# Patient Record
Sex: Female | Born: 1940
Health system: Southern US, Community
[De-identification: ages and names within clinical notes are randomized; demographics above are authoritative.]

## PROBLEM LIST (undated history)

## (undated) DIAGNOSIS — I10 Essential (primary) hypertension: Secondary | ICD-10-CM

## (undated) DIAGNOSIS — N289 Disorder of kidney and ureter, unspecified: Secondary | ICD-10-CM

## (undated) DIAGNOSIS — E119 Type 2 diabetes mellitus without complications: Secondary | ICD-10-CM

## (undated) DIAGNOSIS — I639 Cerebral infarction, unspecified: Secondary | ICD-10-CM

## (undated) DIAGNOSIS — I509 Heart failure, unspecified: Secondary | ICD-10-CM

## (undated) DIAGNOSIS — E7800 Pure hypercholesterolemia, unspecified: Secondary | ICD-10-CM

## (undated) DIAGNOSIS — I4891 Unspecified atrial fibrillation: Secondary | ICD-10-CM

## (undated) DIAGNOSIS — I2510 Atherosclerotic heart disease of native coronary artery without angina pectoris: Secondary | ICD-10-CM

## (undated) HISTORY — DX: Atherosclerotic heart disease of native coronary artery without angina pectoris: I25.10

## (undated) HISTORY — DX: Cerebral infarction, unspecified: I63.9

## (undated) HISTORY — DX: Heart failure, unspecified: I50.9

## (undated) HISTORY — DX: Unspecified atrial fibrillation: I48.91

---

## 2018-12-11 DIAGNOSIS — M5416 Radiculopathy, lumbar region: Secondary | ICD-10-CM | POA: Diagnosis not present

## 2018-12-11 DIAGNOSIS — M5412 Radiculopathy, cervical region: Secondary | ICD-10-CM | POA: Diagnosis not present

## 2018-12-11 DIAGNOSIS — M542 Cervicalgia: Secondary | ICD-10-CM | POA: Diagnosis not present

## 2018-12-14 DIAGNOSIS — E1122 Type 2 diabetes mellitus with diabetic chronic kidney disease: Secondary | ICD-10-CM | POA: Diagnosis not present

## 2018-12-14 DIAGNOSIS — I739 Peripheral vascular disease, unspecified: Secondary | ICD-10-CM | POA: Diagnosis not present

## 2018-12-14 DIAGNOSIS — E785 Hyperlipidemia, unspecified: Secondary | ICD-10-CM | POA: Diagnosis not present

## 2018-12-14 DIAGNOSIS — I10 Essential (primary) hypertension: Secondary | ICD-10-CM | POA: Diagnosis not present

## 2018-12-14 DIAGNOSIS — I700 Atherosclerosis of aorta: Secondary | ICD-10-CM | POA: Diagnosis not present

## 2018-12-14 DIAGNOSIS — N183 Chronic kidney disease, stage 3 (moderate): Secondary | ICD-10-CM | POA: Diagnosis not present

## 2019-01-01 DIAGNOSIS — M5416 Radiculopathy, lumbar region: Secondary | ICD-10-CM | POA: Diagnosis not present

## 2019-01-01 DIAGNOSIS — M542 Cervicalgia: Secondary | ICD-10-CM | POA: Diagnosis not present

## 2019-01-01 DIAGNOSIS — M545 Low back pain: Secondary | ICD-10-CM | POA: Diagnosis not present

## 2019-01-01 DIAGNOSIS — M5412 Radiculopathy, cervical region: Secondary | ICD-10-CM | POA: Diagnosis not present

## 2019-01-01 DIAGNOSIS — Z79891 Long term (current) use of opiate analgesic: Secondary | ICD-10-CM | POA: Diagnosis not present

## 2019-01-01 DIAGNOSIS — M5126 Other intervertebral disc displacement, lumbar region: Secondary | ICD-10-CM | POA: Diagnosis not present

## 2019-01-19 DIAGNOSIS — M545 Low back pain: Secondary | ICD-10-CM | POA: Diagnosis not present

## 2019-01-19 DIAGNOSIS — E119 Type 2 diabetes mellitus without complications: Secondary | ICD-10-CM | POA: Diagnosis not present

## 2019-01-19 DIAGNOSIS — E860 Dehydration: Secondary | ICD-10-CM | POA: Diagnosis not present

## 2019-01-19 DIAGNOSIS — I10 Essential (primary) hypertension: Secondary | ICD-10-CM | POA: Diagnosis not present

## 2019-01-19 DIAGNOSIS — I119 Hypertensive heart disease without heart failure: Secondary | ICD-10-CM | POA: Diagnosis not present

## 2019-01-19 DIAGNOSIS — Z7984 Long term (current) use of oral hypoglycemic drugs: Secondary | ICD-10-CM | POA: Diagnosis not present

## 2019-01-19 DIAGNOSIS — R197 Diarrhea, unspecified: Secondary | ICD-10-CM | POA: Diagnosis not present

## 2019-01-19 DIAGNOSIS — R1084 Generalized abdominal pain: Secondary | ICD-10-CM | POA: Diagnosis not present

## 2019-01-19 DIAGNOSIS — I214 Non-ST elevation (NSTEMI) myocardial infarction: Secondary | ICD-10-CM | POA: Diagnosis not present

## 2019-01-19 DIAGNOSIS — M5416 Radiculopathy, lumbar region: Secondary | ICD-10-CM | POA: Diagnosis not present

## 2019-01-19 DIAGNOSIS — M5116 Intervertebral disc disorders with radiculopathy, lumbar region: Secondary | ICD-10-CM | POA: Diagnosis not present

## 2019-01-19 DIAGNOSIS — R1110 Vomiting, unspecified: Secondary | ICD-10-CM | POA: Diagnosis not present

## 2019-01-19 DIAGNOSIS — R7989 Other specified abnormal findings of blood chemistry: Secondary | ICD-10-CM | POA: Diagnosis not present

## 2019-01-19 DIAGNOSIS — E876 Hypokalemia: Secondary | ICD-10-CM | POA: Diagnosis not present

## 2019-01-19 DIAGNOSIS — Z79899 Other long term (current) drug therapy: Secondary | ICD-10-CM | POA: Diagnosis not present

## 2019-01-20 DIAGNOSIS — E876 Hypokalemia: Secondary | ICD-10-CM | POA: Diagnosis not present

## 2019-01-20 DIAGNOSIS — R109 Unspecified abdominal pain: Secondary | ICD-10-CM | POA: Diagnosis not present

## 2019-01-20 DIAGNOSIS — I214 Non-ST elevation (NSTEMI) myocardial infarction: Secondary | ICD-10-CM | POA: Diagnosis not present

## 2019-01-20 DIAGNOSIS — E119 Type 2 diabetes mellitus without complications: Secondary | ICD-10-CM | POA: Diagnosis not present

## 2019-01-20 DIAGNOSIS — I119 Hypertensive heart disease without heart failure: Secondary | ICD-10-CM | POA: Diagnosis not present

## 2019-01-20 DIAGNOSIS — R1110 Vomiting, unspecified: Secondary | ICD-10-CM | POA: Diagnosis not present

## 2019-01-20 DIAGNOSIS — K529 Noninfective gastroenteritis and colitis, unspecified: Secondary | ICD-10-CM | POA: Diagnosis not present

## 2019-01-20 DIAGNOSIS — E860 Dehydration: Secondary | ICD-10-CM | POA: Diagnosis not present

## 2019-01-20 DIAGNOSIS — R1084 Generalized abdominal pain: Secondary | ICD-10-CM | POA: Diagnosis not present

## 2019-01-20 DIAGNOSIS — E785 Hyperlipidemia, unspecified: Secondary | ICD-10-CM | POA: Diagnosis not present

## 2019-01-20 DIAGNOSIS — M5440 Lumbago with sciatica, unspecified side: Secondary | ICD-10-CM | POA: Diagnosis not present

## 2019-01-20 DIAGNOSIS — R7989 Other specified abnormal findings of blood chemistry: Secondary | ICD-10-CM | POA: Diagnosis not present

## 2019-01-20 DIAGNOSIS — I340 Nonrheumatic mitral (valve) insufficiency: Secondary | ICD-10-CM | POA: Diagnosis not present

## 2019-01-20 DIAGNOSIS — R197 Diarrhea, unspecified: Secondary | ICD-10-CM | POA: Diagnosis not present

## 2019-01-20 DIAGNOSIS — I160 Hypertensive urgency: Secondary | ICD-10-CM | POA: Diagnosis not present

## 2019-01-21 DIAGNOSIS — R7989 Other specified abnormal findings of blood chemistry: Secondary | ICD-10-CM | POA: Diagnosis not present

## 2019-01-23 DIAGNOSIS — K529 Noninfective gastroenteritis and colitis, unspecified: Secondary | ICD-10-CM | POA: Diagnosis not present

## 2019-01-23 DIAGNOSIS — I440 Atrioventricular block, first degree: Secondary | ICD-10-CM | POA: Diagnosis not present

## 2019-01-23 DIAGNOSIS — E860 Dehydration: Secondary | ICD-10-CM | POA: Diagnosis not present

## 2019-01-23 DIAGNOSIS — M5430 Sciatica, unspecified side: Secondary | ICD-10-CM | POA: Diagnosis not present

## 2019-01-23 DIAGNOSIS — N289 Disorder of kidney and ureter, unspecified: Secondary | ICD-10-CM | POA: Diagnosis not present

## 2019-01-23 DIAGNOSIS — I10 Essential (primary) hypertension: Secondary | ICD-10-CM | POA: Diagnosis not present

## 2019-01-23 DIAGNOSIS — E119 Type 2 diabetes mellitus without complications: Secondary | ICD-10-CM | POA: Diagnosis not present

## 2019-01-23 DIAGNOSIS — I160 Hypertensive urgency: Secondary | ICD-10-CM | POA: Diagnosis not present

## 2019-01-23 DIAGNOSIS — E669 Obesity, unspecified: Secondary | ICD-10-CM | POA: Diagnosis not present

## 2019-01-23 DIAGNOSIS — Z7982 Long term (current) use of aspirin: Secondary | ICD-10-CM | POA: Diagnosis not present

## 2019-01-28 DIAGNOSIS — E785 Hyperlipidemia, unspecified: Secondary | ICD-10-CM | POA: Diagnosis not present

## 2019-01-28 DIAGNOSIS — E1142 Type 2 diabetes mellitus with diabetic polyneuropathy: Secondary | ICD-10-CM | POA: Diagnosis not present

## 2019-01-28 DIAGNOSIS — I700 Atherosclerosis of aorta: Secondary | ICD-10-CM | POA: Diagnosis not present

## 2019-01-28 DIAGNOSIS — E1122 Type 2 diabetes mellitus with diabetic chronic kidney disease: Secondary | ICD-10-CM | POA: Diagnosis not present

## 2019-01-28 DIAGNOSIS — M1990 Unspecified osteoarthritis, unspecified site: Secondary | ICD-10-CM | POA: Diagnosis not present

## 2019-01-28 DIAGNOSIS — Z1389 Encounter for screening for other disorder: Secondary | ICD-10-CM | POA: Diagnosis not present

## 2019-01-28 DIAGNOSIS — G4700 Insomnia, unspecified: Secondary | ICD-10-CM | POA: Diagnosis not present

## 2019-01-28 DIAGNOSIS — Z0289 Encounter for other administrative examinations: Secondary | ICD-10-CM | POA: Diagnosis not present

## 2019-01-28 DIAGNOSIS — E559 Vitamin D deficiency, unspecified: Secondary | ICD-10-CM | POA: Diagnosis not present

## 2019-01-28 DIAGNOSIS — K5900 Constipation, unspecified: Secondary | ICD-10-CM | POA: Diagnosis not present

## 2019-01-28 DIAGNOSIS — N183 Chronic kidney disease, stage 3 (moderate): Secondary | ICD-10-CM | POA: Diagnosis not present

## 2019-01-28 DIAGNOSIS — I739 Peripheral vascular disease, unspecified: Secondary | ICD-10-CM | POA: Diagnosis not present

## 2019-01-28 DIAGNOSIS — I10 Essential (primary) hypertension: Secondary | ICD-10-CM | POA: Diagnosis not present

## 2019-02-01 DIAGNOSIS — K219 Gastro-esophageal reflux disease without esophagitis: Secondary | ICD-10-CM | POA: Diagnosis not present

## 2019-02-01 DIAGNOSIS — Z6835 Body mass index (BMI) 35.0-35.9, adult: Secondary | ICD-10-CM | POA: Diagnosis not present

## 2019-02-01 DIAGNOSIS — E785 Hyperlipidemia, unspecified: Secondary | ICD-10-CM | POA: Diagnosis not present

## 2019-02-01 DIAGNOSIS — I739 Peripheral vascular disease, unspecified: Secondary | ICD-10-CM | POA: Diagnosis not present

## 2019-02-15 DIAGNOSIS — G4700 Insomnia, unspecified: Secondary | ICD-10-CM | POA: Diagnosis not present

## 2019-02-15 DIAGNOSIS — I700 Atherosclerosis of aorta: Secondary | ICD-10-CM | POA: Diagnosis not present

## 2019-02-15 DIAGNOSIS — Z6835 Body mass index (BMI) 35.0-35.9, adult: Secondary | ICD-10-CM | POA: Diagnosis not present

## 2019-02-15 DIAGNOSIS — M4802 Spinal stenosis, cervical region: Secondary | ICD-10-CM | POA: Diagnosis not present

## 2019-02-15 DIAGNOSIS — G720 Drug-induced myopathy: Secondary | ICD-10-CM | POA: Diagnosis not present

## 2019-02-15 DIAGNOSIS — E559 Vitamin D deficiency, unspecified: Secondary | ICD-10-CM | POA: Diagnosis not present

## 2019-02-15 DIAGNOSIS — I739 Peripheral vascular disease, unspecified: Secondary | ICD-10-CM | POA: Diagnosis not present

## 2019-06-16 DIAGNOSIS — H04123 Dry eye syndrome of bilateral lacrimal glands: Secondary | ICD-10-CM | POA: Diagnosis not present

## 2019-06-16 DIAGNOSIS — H524 Presbyopia: Secondary | ICD-10-CM | POA: Diagnosis not present

## 2019-06-16 DIAGNOSIS — E119 Type 2 diabetes mellitus without complications: Secondary | ICD-10-CM | POA: Diagnosis not present

## 2019-07-02 DIAGNOSIS — Z6835 Body mass index (BMI) 35.0-35.9, adult: Secondary | ICD-10-CM | POA: Diagnosis not present

## 2019-07-02 DIAGNOSIS — M2391 Unspecified internal derangement of right knee: Secondary | ICD-10-CM | POA: Diagnosis not present

## 2019-07-02 DIAGNOSIS — M2392 Unspecified internal derangement of left knee: Secondary | ICD-10-CM | POA: Diagnosis not present

## 2019-07-19 DIAGNOSIS — I739 Peripheral vascular disease, unspecified: Secondary | ICD-10-CM | POA: Diagnosis not present

## 2019-07-19 DIAGNOSIS — D692 Other nonthrombocytopenic purpura: Secondary | ICD-10-CM | POA: Diagnosis not present

## 2019-07-19 DIAGNOSIS — K5900 Constipation, unspecified: Secondary | ICD-10-CM | POA: Diagnosis not present

## 2019-07-19 DIAGNOSIS — Z23 Encounter for immunization: Secondary | ICD-10-CM | POA: Diagnosis not present

## 2019-07-19 DIAGNOSIS — E1122 Type 2 diabetes mellitus with diabetic chronic kidney disease: Secondary | ICD-10-CM | POA: Diagnosis not present

## 2019-07-19 DIAGNOSIS — K219 Gastro-esophageal reflux disease without esophagitis: Secondary | ICD-10-CM | POA: Diagnosis not present

## 2019-07-19 DIAGNOSIS — I10 Essential (primary) hypertension: Secondary | ICD-10-CM | POA: Diagnosis not present

## 2019-07-19 DIAGNOSIS — I700 Atherosclerosis of aorta: Secondary | ICD-10-CM | POA: Diagnosis not present

## 2019-07-19 DIAGNOSIS — E1142 Type 2 diabetes mellitus with diabetic polyneuropathy: Secondary | ICD-10-CM | POA: Diagnosis not present

## 2019-07-19 DIAGNOSIS — M1990 Unspecified osteoarthritis, unspecified site: Secondary | ICD-10-CM | POA: Diagnosis not present

## 2019-07-19 DIAGNOSIS — N183 Chronic kidney disease, stage 3 (moderate): Secondary | ICD-10-CM | POA: Diagnosis not present

## 2019-08-17 DIAGNOSIS — H02413 Mechanical ptosis of bilateral eyelids: Secondary | ICD-10-CM | POA: Diagnosis not present

## 2019-09-02 DIAGNOSIS — N183 Chronic kidney disease, stage 3 (moderate): Secondary | ICD-10-CM | POA: Diagnosis not present

## 2019-09-02 DIAGNOSIS — E1142 Type 2 diabetes mellitus with diabetic polyneuropathy: Secondary | ICD-10-CM | POA: Diagnosis not present

## 2019-09-02 DIAGNOSIS — E1122 Type 2 diabetes mellitus with diabetic chronic kidney disease: Secondary | ICD-10-CM | POA: Diagnosis not present

## 2019-09-02 DIAGNOSIS — I739 Peripheral vascular disease, unspecified: Secondary | ICD-10-CM | POA: Diagnosis not present

## 2019-09-02 DIAGNOSIS — D692 Other nonthrombocytopenic purpura: Secondary | ICD-10-CM | POA: Diagnosis not present

## 2019-09-02 DIAGNOSIS — I700 Atherosclerosis of aorta: Secondary | ICD-10-CM | POA: Diagnosis not present

## 2019-09-02 DIAGNOSIS — Z0289 Encounter for other administrative examinations: Secondary | ICD-10-CM | POA: Diagnosis not present

## 2019-09-02 DIAGNOSIS — Z6835 Body mass index (BMI) 35.0-35.9, adult: Secondary | ICD-10-CM | POA: Diagnosis not present

## 2019-09-08 DIAGNOSIS — I700 Atherosclerosis of aorta: Secondary | ICD-10-CM | POA: Diagnosis not present

## 2019-09-08 DIAGNOSIS — R69 Illness, unspecified: Secondary | ICD-10-CM | POA: Diagnosis not present

## 2019-09-08 DIAGNOSIS — M109 Gout, unspecified: Secondary | ICD-10-CM | POA: Diagnosis not present

## 2019-09-08 DIAGNOSIS — M545 Low back pain: Secondary | ICD-10-CM | POA: Diagnosis not present

## 2019-09-08 DIAGNOSIS — R351 Nocturia: Secondary | ICD-10-CM | POA: Diagnosis not present

## 2019-09-09 DIAGNOSIS — R350 Frequency of micturition: Secondary | ICD-10-CM | POA: Diagnosis not present

## 2019-09-09 DIAGNOSIS — R69 Illness, unspecified: Secondary | ICD-10-CM | POA: Diagnosis not present

## 2019-09-09 DIAGNOSIS — R351 Nocturia: Secondary | ICD-10-CM | POA: Diagnosis not present

## 2019-09-09 DIAGNOSIS — I700 Atherosclerosis of aorta: Secondary | ICD-10-CM | POA: Diagnosis not present

## 2019-09-09 DIAGNOSIS — M545 Low back pain: Secondary | ICD-10-CM | POA: Diagnosis not present

## 2019-09-09 DIAGNOSIS — N3941 Urge incontinence: Secondary | ICD-10-CM | POA: Diagnosis not present

## 2019-09-09 DIAGNOSIS — R3915 Urgency of urination: Secondary | ICD-10-CM | POA: Diagnosis not present

## 2019-09-09 DIAGNOSIS — M109 Gout, unspecified: Secondary | ICD-10-CM | POA: Diagnosis not present

## 2019-09-10 DIAGNOSIS — R351 Nocturia: Secondary | ICD-10-CM | POA: Diagnosis not present

## 2019-09-10 DIAGNOSIS — M545 Low back pain: Secondary | ICD-10-CM | POA: Diagnosis not present

## 2019-09-10 DIAGNOSIS — M109 Gout, unspecified: Secondary | ICD-10-CM | POA: Diagnosis not present

## 2019-09-10 DIAGNOSIS — R69 Illness, unspecified: Secondary | ICD-10-CM | POA: Diagnosis not present

## 2019-09-10 DIAGNOSIS — I700 Atherosclerosis of aorta: Secondary | ICD-10-CM | POA: Diagnosis not present

## 2019-09-13 DIAGNOSIS — R351 Nocturia: Secondary | ICD-10-CM | POA: Diagnosis not present

## 2019-09-13 DIAGNOSIS — M545 Low back pain: Secondary | ICD-10-CM | POA: Diagnosis not present

## 2019-09-13 DIAGNOSIS — M109 Gout, unspecified: Secondary | ICD-10-CM | POA: Diagnosis not present

## 2019-09-13 DIAGNOSIS — I700 Atherosclerosis of aorta: Secondary | ICD-10-CM | POA: Diagnosis not present

## 2019-09-13 DIAGNOSIS — R69 Illness, unspecified: Secondary | ICD-10-CM | POA: Diagnosis not present

## 2019-09-14 DIAGNOSIS — I700 Atherosclerosis of aorta: Secondary | ICD-10-CM | POA: Diagnosis not present

## 2019-09-14 DIAGNOSIS — M109 Gout, unspecified: Secondary | ICD-10-CM | POA: Diagnosis not present

## 2019-09-14 DIAGNOSIS — R69 Illness, unspecified: Secondary | ICD-10-CM | POA: Diagnosis not present

## 2019-09-14 DIAGNOSIS — M545 Low back pain: Secondary | ICD-10-CM | POA: Diagnosis not present

## 2019-09-14 DIAGNOSIS — R351 Nocturia: Secondary | ICD-10-CM | POA: Diagnosis not present

## 2019-09-15 DIAGNOSIS — I739 Peripheral vascular disease, unspecified: Secondary | ICD-10-CM | POA: Diagnosis not present

## 2019-09-15 DIAGNOSIS — I10 Essential (primary) hypertension: Secondary | ICD-10-CM | POA: Diagnosis not present

## 2019-09-15 DIAGNOSIS — D692 Other nonthrombocytopenic purpura: Secondary | ICD-10-CM | POA: Diagnosis not present

## 2019-09-15 DIAGNOSIS — E1142 Type 2 diabetes mellitus with diabetic polyneuropathy: Secondary | ICD-10-CM | POA: Diagnosis not present

## 2019-09-15 DIAGNOSIS — M545 Low back pain: Secondary | ICD-10-CM | POA: Diagnosis not present

## 2019-09-15 DIAGNOSIS — M109 Gout, unspecified: Secondary | ICD-10-CM | POA: Diagnosis not present

## 2019-09-15 DIAGNOSIS — R69 Illness, unspecified: Secondary | ICD-10-CM | POA: Diagnosis not present

## 2019-09-15 DIAGNOSIS — N183 Chronic kidney disease, stage 3 unspecified: Secondary | ICD-10-CM | POA: Diagnosis not present

## 2019-09-15 DIAGNOSIS — R351 Nocturia: Secondary | ICD-10-CM | POA: Diagnosis not present

## 2019-09-15 DIAGNOSIS — I700 Atherosclerosis of aorta: Secondary | ICD-10-CM | POA: Diagnosis not present

## 2019-09-15 DIAGNOSIS — Z6835 Body mass index (BMI) 35.0-35.9, adult: Secondary | ICD-10-CM | POA: Diagnosis not present

## 2019-09-15 DIAGNOSIS — E1122 Type 2 diabetes mellitus with diabetic chronic kidney disease: Secondary | ICD-10-CM | POA: Diagnosis not present

## 2019-09-16 DIAGNOSIS — I700 Atherosclerosis of aorta: Secondary | ICD-10-CM | POA: Diagnosis not present

## 2019-09-16 DIAGNOSIS — M545 Low back pain: Secondary | ICD-10-CM | POA: Diagnosis not present

## 2019-09-16 DIAGNOSIS — R69 Illness, unspecified: Secondary | ICD-10-CM | POA: Diagnosis not present

## 2019-09-16 DIAGNOSIS — M109 Gout, unspecified: Secondary | ICD-10-CM | POA: Diagnosis not present

## 2019-09-16 DIAGNOSIS — R351 Nocturia: Secondary | ICD-10-CM | POA: Diagnosis not present

## 2019-09-22 DIAGNOSIS — R221 Localized swelling, mass and lump, neck: Secondary | ICD-10-CM | POA: Diagnosis not present

## 2019-09-22 DIAGNOSIS — H6121 Impacted cerumen, right ear: Secondary | ICD-10-CM | POA: Diagnosis not present

## 2019-09-22 DIAGNOSIS — K219 Gastro-esophageal reflux disease without esophagitis: Secondary | ICD-10-CM | POA: Diagnosis not present

## 2019-09-23 DIAGNOSIS — Z6835 Body mass index (BMI) 35.0-35.9, adult: Secondary | ICD-10-CM | POA: Diagnosis not present

## 2019-09-23 DIAGNOSIS — I10 Essential (primary) hypertension: Secondary | ICD-10-CM | POA: Diagnosis not present

## 2019-09-29 DIAGNOSIS — K219 Gastro-esophageal reflux disease without esophagitis: Secondary | ICD-10-CM | POA: Diagnosis not present

## 2019-09-29 DIAGNOSIS — R221 Localized swelling, mass and lump, neck: Secondary | ICD-10-CM | POA: Diagnosis not present

## 2019-10-20 DIAGNOSIS — E7800 Pure hypercholesterolemia, unspecified: Secondary | ICD-10-CM | POA: Diagnosis not present

## 2019-10-20 DIAGNOSIS — Z1331 Encounter for screening for depression: Secondary | ICD-10-CM | POA: Diagnosis not present

## 2019-10-20 DIAGNOSIS — E1165 Type 2 diabetes mellitus with hyperglycemia: Secondary | ICD-10-CM | POA: Diagnosis not present

## 2019-10-20 DIAGNOSIS — M159 Polyosteoarthritis, unspecified: Secondary | ICD-10-CM | POA: Diagnosis not present

## 2019-10-20 DIAGNOSIS — I10 Essential (primary) hypertension: Secondary | ICD-10-CM | POA: Diagnosis not present

## 2019-10-20 DIAGNOSIS — M542 Cervicalgia: Secondary | ICD-10-CM | POA: Diagnosis not present

## 2019-10-20 DIAGNOSIS — Z6832 Body mass index (BMI) 32.0-32.9, adult: Secondary | ICD-10-CM | POA: Diagnosis not present

## 2019-10-20 DIAGNOSIS — Z0001 Encounter for general adult medical examination with abnormal findings: Secondary | ICD-10-CM | POA: Diagnosis not present

## 2019-10-25 DIAGNOSIS — Z0001 Encounter for general adult medical examination with abnormal findings: Secondary | ICD-10-CM | POA: Diagnosis not present

## 2019-10-25 DIAGNOSIS — Z6832 Body mass index (BMI) 32.0-32.9, adult: Secondary | ICD-10-CM | POA: Diagnosis not present

## 2019-10-25 DIAGNOSIS — R5383 Other fatigue: Secondary | ICD-10-CM | POA: Diagnosis not present

## 2019-10-25 DIAGNOSIS — M542 Cervicalgia: Secondary | ICD-10-CM | POA: Diagnosis not present

## 2019-10-25 DIAGNOSIS — E7800 Pure hypercholesterolemia, unspecified: Secondary | ICD-10-CM | POA: Diagnosis not present

## 2019-10-27 DIAGNOSIS — Z1211 Encounter for screening for malignant neoplasm of colon: Secondary | ICD-10-CM | POA: Diagnosis not present

## 2019-11-14 DIAGNOSIS — E1122 Type 2 diabetes mellitus with diabetic chronic kidney disease: Secondary | ICD-10-CM | POA: Diagnosis not present

## 2019-11-14 DIAGNOSIS — M159 Polyosteoarthritis, unspecified: Secondary | ICD-10-CM | POA: Diagnosis not present

## 2019-11-14 DIAGNOSIS — E1165 Type 2 diabetes mellitus with hyperglycemia: Secondary | ICD-10-CM | POA: Diagnosis not present

## 2019-11-14 DIAGNOSIS — N1831 Chronic kidney disease, stage 3a: Secondary | ICD-10-CM | POA: Diagnosis not present

## 2019-11-14 DIAGNOSIS — M542 Cervicalgia: Secondary | ICD-10-CM | POA: Diagnosis not present

## 2019-11-14 DIAGNOSIS — E559 Vitamin D deficiency, unspecified: Secondary | ICD-10-CM | POA: Diagnosis not present

## 2019-11-14 DIAGNOSIS — Z6832 Body mass index (BMI) 32.0-32.9, adult: Secondary | ICD-10-CM | POA: Diagnosis not present

## 2019-11-14 DIAGNOSIS — I10 Essential (primary) hypertension: Secondary | ICD-10-CM | POA: Diagnosis not present

## 2019-11-14 DIAGNOSIS — E782 Mixed hyperlipidemia: Secondary | ICD-10-CM | POA: Diagnosis not present

## 2019-11-16 DIAGNOSIS — M8588 Other specified disorders of bone density and structure, other site: Secondary | ICD-10-CM | POA: Diagnosis not present

## 2019-11-16 DIAGNOSIS — Z1231 Encounter for screening mammogram for malignant neoplasm of breast: Secondary | ICD-10-CM | POA: Diagnosis not present

## 2019-11-22 DIAGNOSIS — H0011 Chalazion right upper eyelid: Secondary | ICD-10-CM | POA: Diagnosis not present

## 2019-11-29 DIAGNOSIS — H04123 Dry eye syndrome of bilateral lacrimal glands: Secondary | ICD-10-CM | POA: Diagnosis not present

## 2019-11-29 DIAGNOSIS — H0100B Unspecified blepharitis left eye, upper and lower eyelids: Secondary | ICD-10-CM | POA: Diagnosis not present

## 2019-11-29 DIAGNOSIS — H0011 Chalazion right upper eyelid: Secondary | ICD-10-CM | POA: Diagnosis not present

## 2019-11-29 DIAGNOSIS — H0100A Unspecified blepharitis right eye, upper and lower eyelids: Secondary | ICD-10-CM | POA: Diagnosis not present

## 2019-12-13 DIAGNOSIS — Z008 Encounter for other general examination: Secondary | ICD-10-CM | POA: Diagnosis not present

## 2019-12-14 DIAGNOSIS — N1831 Chronic kidney disease, stage 3a: Secondary | ICD-10-CM | POA: Diagnosis not present

## 2019-12-14 DIAGNOSIS — M1990 Unspecified osteoarthritis, unspecified site: Secondary | ICD-10-CM | POA: Diagnosis not present

## 2019-12-14 DIAGNOSIS — E559 Vitamin D deficiency, unspecified: Secondary | ICD-10-CM | POA: Diagnosis not present

## 2019-12-14 DIAGNOSIS — E1122 Type 2 diabetes mellitus with diabetic chronic kidney disease: Secondary | ICD-10-CM | POA: Diagnosis not present

## 2019-12-14 DIAGNOSIS — I10 Essential (primary) hypertension: Secondary | ICD-10-CM | POA: Diagnosis not present

## 2019-12-14 DIAGNOSIS — M159 Polyosteoarthritis, unspecified: Secondary | ICD-10-CM | POA: Diagnosis not present

## 2019-12-14 DIAGNOSIS — M542 Cervicalgia: Secondary | ICD-10-CM | POA: Diagnosis not present

## 2019-12-14 DIAGNOSIS — E1165 Type 2 diabetes mellitus with hyperglycemia: Secondary | ICD-10-CM | POA: Diagnosis not present

## 2019-12-14 DIAGNOSIS — Z008 Encounter for other general examination: Secondary | ICD-10-CM | POA: Diagnosis not present

## 2019-12-14 DIAGNOSIS — L02415 Cutaneous abscess of right lower limb: Secondary | ICD-10-CM | POA: Diagnosis not present

## 2019-12-15 DIAGNOSIS — Z743 Need for continuous supervision: Secondary | ICD-10-CM | POA: Diagnosis not present

## 2019-12-15 DIAGNOSIS — E119 Type 2 diabetes mellitus without complications: Secondary | ICD-10-CM | POA: Diagnosis not present

## 2019-12-15 DIAGNOSIS — M542 Cervicalgia: Secondary | ICD-10-CM | POA: Diagnosis not present

## 2019-12-15 DIAGNOSIS — M436 Torticollis: Secondary | ICD-10-CM | POA: Diagnosis not present

## 2019-12-15 DIAGNOSIS — R000 Tachycardia, unspecified: Secondary | ICD-10-CM | POA: Diagnosis not present

## 2019-12-15 DIAGNOSIS — I10 Essential (primary) hypertension: Secondary | ICD-10-CM | POA: Diagnosis not present

## 2019-12-16 DIAGNOSIS — E042 Nontoxic multinodular goiter: Secondary | ICD-10-CM | POA: Diagnosis not present

## 2019-12-16 DIAGNOSIS — R634 Abnormal weight loss: Secondary | ICD-10-CM | POA: Diagnosis not present

## 2019-12-16 DIAGNOSIS — Z008 Encounter for other general examination: Secondary | ICD-10-CM | POA: Diagnosis not present

## 2019-12-17 DIAGNOSIS — L02214 Cutaneous abscess of groin: Secondary | ICD-10-CM | POA: Diagnosis not present

## 2019-12-19 DIAGNOSIS — I10 Essential (primary) hypertension: Secondary | ICD-10-CM | POA: Diagnosis not present

## 2019-12-19 DIAGNOSIS — L02214 Cutaneous abscess of groin: Secondary | ICD-10-CM | POA: Diagnosis not present

## 2020-01-06 DIAGNOSIS — M8949 Other hypertrophic osteoarthropathy, multiple sites: Secondary | ICD-10-CM | POA: Diagnosis not present

## 2020-01-06 DIAGNOSIS — E041 Nontoxic single thyroid nodule: Secondary | ICD-10-CM | POA: Diagnosis not present

## 2020-01-06 DIAGNOSIS — R69 Illness, unspecified: Secondary | ICD-10-CM | POA: Diagnosis not present

## 2020-01-06 DIAGNOSIS — I10 Essential (primary) hypertension: Secondary | ICD-10-CM | POA: Diagnosis not present

## 2020-01-06 DIAGNOSIS — I517 Cardiomegaly: Secondary | ICD-10-CM | POA: Diagnosis not present

## 2020-01-06 DIAGNOSIS — E782 Mixed hyperlipidemia: Secondary | ICD-10-CM | POA: Diagnosis not present

## 2020-01-06 DIAGNOSIS — E1165 Type 2 diabetes mellitus with hyperglycemia: Secondary | ICD-10-CM | POA: Diagnosis not present

## 2020-01-06 DIAGNOSIS — K219 Gastro-esophageal reflux disease without esophagitis: Secondary | ICD-10-CM | POA: Diagnosis not present

## 2020-01-06 DIAGNOSIS — E559 Vitamin D deficiency, unspecified: Secondary | ICD-10-CM | POA: Diagnosis not present

## 2020-01-14 DIAGNOSIS — E782 Mixed hyperlipidemia: Secondary | ICD-10-CM | POA: Diagnosis not present

## 2020-01-14 DIAGNOSIS — E1121 Type 2 diabetes mellitus with diabetic nephropathy: Secondary | ICD-10-CM | POA: Diagnosis not present

## 2020-01-14 DIAGNOSIS — M542 Cervicalgia: Secondary | ICD-10-CM | POA: Diagnosis not present

## 2020-01-14 DIAGNOSIS — E559 Vitamin D deficiency, unspecified: Secondary | ICD-10-CM | POA: Diagnosis not present

## 2020-01-14 DIAGNOSIS — R05 Cough: Secondary | ICD-10-CM | POA: Diagnosis not present

## 2020-01-14 DIAGNOSIS — E1165 Type 2 diabetes mellitus with hyperglycemia: Secondary | ICD-10-CM | POA: Diagnosis not present

## 2020-01-14 DIAGNOSIS — K5904 Chronic idiopathic constipation: Secondary | ICD-10-CM | POA: Diagnosis not present

## 2020-01-14 DIAGNOSIS — N189 Chronic kidney disease, unspecified: Secondary | ICD-10-CM | POA: Diagnosis not present

## 2020-01-14 DIAGNOSIS — I1310 Hypertensive heart and chronic kidney disease without heart failure, with stage 1 through stage 4 chronic kidney disease, or unspecified chronic kidney disease: Secondary | ICD-10-CM | POA: Diagnosis not present

## 2020-01-14 DIAGNOSIS — K219 Gastro-esophageal reflux disease without esophagitis: Secondary | ICD-10-CM | POA: Diagnosis not present

## 2020-01-24 DIAGNOSIS — M542 Cervicalgia: Secondary | ICD-10-CM | POA: Diagnosis not present

## 2020-01-24 DIAGNOSIS — E559 Vitamin D deficiency, unspecified: Secondary | ICD-10-CM | POA: Diagnosis not present

## 2020-01-24 DIAGNOSIS — R05 Cough: Secondary | ICD-10-CM | POA: Diagnosis not present

## 2020-01-24 DIAGNOSIS — I10 Essential (primary) hypertension: Secondary | ICD-10-CM | POA: Diagnosis not present

## 2020-01-24 DIAGNOSIS — N189 Chronic kidney disease, unspecified: Secondary | ICD-10-CM | POA: Diagnosis not present

## 2020-01-26 DIAGNOSIS — I10 Essential (primary) hypertension: Secondary | ICD-10-CM | POA: Diagnosis not present

## 2020-01-26 DIAGNOSIS — R69 Illness, unspecified: Secondary | ICD-10-CM | POA: Diagnosis not present

## 2020-01-26 DIAGNOSIS — E782 Mixed hyperlipidemia: Secondary | ICD-10-CM | POA: Diagnosis not present

## 2020-01-26 DIAGNOSIS — G8929 Other chronic pain: Secondary | ICD-10-CM | POA: Diagnosis not present

## 2020-01-26 DIAGNOSIS — M542 Cervicalgia: Secondary | ICD-10-CM | POA: Diagnosis not present

## 2020-01-26 DIAGNOSIS — R7989 Other specified abnormal findings of blood chemistry: Secondary | ICD-10-CM | POA: Diagnosis not present

## 2020-01-26 DIAGNOSIS — R05 Cough: Secondary | ICD-10-CM | POA: Diagnosis not present

## 2020-01-28 DIAGNOSIS — G8929 Other chronic pain: Secondary | ICD-10-CM | POA: Diagnosis not present

## 2020-02-10 DIAGNOSIS — R7989 Other specified abnormal findings of blood chemistry: Secondary | ICD-10-CM | POA: Diagnosis not present

## 2020-02-10 DIAGNOSIS — Z1159 Encounter for screening for other viral diseases: Secondary | ICD-10-CM | POA: Diagnosis not present

## 2020-02-10 DIAGNOSIS — I10 Essential (primary) hypertension: Secondary | ICD-10-CM | POA: Diagnosis not present

## 2020-02-10 DIAGNOSIS — Z0000 Encounter for general adult medical examination without abnormal findings: Secondary | ICD-10-CM | POA: Diagnosis not present

## 2020-02-10 DIAGNOSIS — E559 Vitamin D deficiency, unspecified: Secondary | ICD-10-CM | POA: Diagnosis not present

## 2020-02-10 DIAGNOSIS — G8929 Other chronic pain: Secondary | ICD-10-CM | POA: Diagnosis not present

## 2020-02-10 DIAGNOSIS — R05 Cough: Secondary | ICD-10-CM | POA: Diagnosis not present

## 2020-02-10 DIAGNOSIS — M542 Cervicalgia: Secondary | ICD-10-CM | POA: Diagnosis not present

## 2020-02-10 DIAGNOSIS — Z23 Encounter for immunization: Secondary | ICD-10-CM | POA: Diagnosis not present

## 2020-02-10 DIAGNOSIS — Z79899 Other long term (current) drug therapy: Secondary | ICD-10-CM | POA: Diagnosis not present

## 2020-02-28 ENCOUNTER — Other Ambulatory Visit: Payer: Self-pay

## 2020-02-28 ENCOUNTER — Telehealth: Payer: Self-pay | Admitting: Emergency Medicine

## 2020-02-28 ENCOUNTER — Encounter: Payer: Self-pay | Admitting: Emergency Medicine

## 2020-02-28 ENCOUNTER — Ambulatory Visit
Admission: EM | Admit: 2020-02-28 | Discharge: 2020-02-28 | Disposition: A | Discharge status: Home / Self Care | Payer: Medicare HMO

## 2020-02-28 ENCOUNTER — Ambulatory Visit (HOSPITAL_COMMUNITY): Payer: Medicare HMO

## 2020-02-28 DIAGNOSIS — N184 Chronic kidney disease, stage 4 (severe): Secondary | ICD-10-CM | POA: Diagnosis not present

## 2020-02-28 DIAGNOSIS — R5383 Other fatigue: Secondary | ICD-10-CM | POA: Diagnosis not present

## 2020-02-28 DIAGNOSIS — R197 Diarrhea, unspecified: Secondary | ICD-10-CM | POA: Diagnosis not present

## 2020-02-28 DIAGNOSIS — R600 Localized edema: Secondary | ICD-10-CM | POA: Diagnosis not present

## 2020-02-28 HISTORY — DX: Pure hypercholesterolemia, unspecified: E78.00

## 2020-02-28 HISTORY — DX: Disorder of kidney and ureter, unspecified: N28.9

## 2020-02-28 HISTORY — DX: Essential (primary) hypertension: I10

## 2020-02-28 HISTORY — DX: Type 2 diabetes mellitus without complications: E11.9

## 2020-02-28 LAB — COMPREHENSIVE METABOLIC PANEL
ALT: 12 IU/L (ref 0–32)
AST: 17 IU/L (ref 0–40)
Albumin/Globulin Ratio: 1.3 (ref 1.2–2.2)
Albumin: 3.9 g/dL (ref 3.7–4.7)
Alkaline Phosphatase: 73 IU/L (ref 39–117)
BUN/Creatinine Ratio: 12 (ref 12–28)
BUN: 17 mg/dL (ref 8–27)
Bilirubin Total: 0.5 mg/dL (ref 0.0–1.2)
CO2: 22 mmol/L (ref 20–29)
Calcium: 9.6 mg/dL (ref 8.7–10.3)
Chloride: 101 mmol/L (ref 96–106)
Creatinine, Ser: 1.41 mg/dL — ABNORMAL HIGH (ref 0.57–1.00)
GFR calc Af Amer: 41 mL/min/{1.73_m2} — ABNORMAL LOW (ref >59)
GFR calc non Af Amer: 35 mL/min/{1.73_m2} — ABNORMAL LOW (ref >59)
Globulin, Total: 2.9 g/dL (ref 1.5–4.5)
Glucose: 91 mg/dL (ref 65–99)
Potassium: 3.9 mmol/L (ref 3.5–5.2)
Sodium: 137 mmol/L (ref 134–144)
Total Protein: 6.8 g/dL (ref 6.0–8.5)

## 2020-02-28 LAB — CBC WITH DIFFERENTIAL/PLATELET
Basophils Absolute: 0.1 10*3/uL (ref 0.0–0.2)
Basos: 0 %
EOS (ABSOLUTE): 0.2 10*3/uL (ref 0.0–0.4)
Eos: 1 %
Hematocrit: 31.7 % — ABNORMAL LOW (ref 34.0–46.6)
Hemoglobin: 10.6 g/dL — ABNORMAL LOW (ref 11.1–15.9)
Immature Grans (Abs): 0.1 10*3/uL (ref 0.0–0.1)
Immature Granulocytes: 1 %
Lymphocytes Absolute: 2.4 10*3/uL (ref 0.7–3.1)
Lymphs: 17 %
MCH: 30.6 pg (ref 26.6–33.0)
MCHC: 33.4 g/dL (ref 31.5–35.7)
MCV: 92 fL (ref 79–97)
Monocytes Absolute: 1.7 10*3/uL — ABNORMAL HIGH (ref 0.1–0.9)
Monocytes: 12 %
Neutrophils Absolute: 9.9 10*3/uL — ABNORMAL HIGH (ref 1.4–7.0)
Neutrophils: 69 %
Platelets: 251 10*3/uL (ref 150–450)
RBC: 3.46 x10E6/uL — ABNORMAL LOW (ref 3.77–5.28)
RDW: 12.4 % (ref 11.7–15.4)
WBC: 14.3 10*3/uL — ABNORMAL HIGH (ref 3.4–10.8)

## 2020-02-28 NOTE — ED Notes (Signed)
Patient able to ambulate independently  

## 2020-02-28 NOTE — ED Triage Notes (Signed)
Pt presents to [facility] for assessment of 1 week of loss of appetite, feeling like she is burning up in the afternoons with no fever registering on the thermometer, fatigue especially after exertion, diarrhea, left knee pain, and left leg swelling.  Patient states she just moved here 1 week ago, no PCP in town yet.

## 2020-02-28 NOTE — ED Provider Notes (Signed)
[name]    CSN: [id number] Arrival date & time: 02/28/20  1640      History   Chief Complaint Chief Complaint  Patient presents with  . Fatigue  . Diarrhea    HPI Kristin [name] is a 79 y.o. female with history of diabetes, hyperlipidemia, hypertension presenting for 1 week course of decreased appetite.  Daughter at bedside, provides majority of history: Denies nausea, vomiting, abdominal pain.  Does note loose stools for the last 2 weeks without blood or melena or pain.  States loose stools is intermittent: Will have about 6 bowel movements daily when it is loose.  Last bowel movement was this morning without mucus, blood, melena.  Denies chest pain, palpitations, wheezing or cough.  Patient also endorsing left knee pain with left leg swelling x 3 days.  Denies injury, increased use.  Has been taking Aleve for this without significant relief.  Denies history of blood clot, recent fall or trauma, surgery.  No difficulty breathing, hemoptysis.  Patient did relocate from [place] last week.  Patient does endorse fatigue after exertion, though daughter states this has been chronic/stable.   Past Medical History:  Diagnosis Date  . Diabetes mellitus without complication ([place])   . Hypercholesteremia   . Hypertension   . Renal disorder    CKD Stage IV    There are no problems to display for this patient.   History reviewed. No pertinent surgical history.  OB History   No obstetric history on file.      Home Medications    Prior to Admission medications   Medication Sig Start Date End Date Taking? Authorizing Provider  amLODipine (NORVASC) 10 MG tablet Take 10 mg by mouth daily.   Yes [provider]  aspirin EC 81 MG tablet Take 81 mg by mouth daily.   Yes [provider]  carvedilol (COREG) 12.5 MG tablet Take 12.5 mg by mouth 2 (two) times daily with a meal.   Yes [provider]  cholecalciferol (D-VI-SOL) 10 MCG/ML LIQD Take  1,000 Units by mouth in the morning and at bedtime.   Yes [provider]  cloNIDine (CATAPRES) 0.1 MG tablet Take 0.1 mg by mouth 2 (two) times daily.   Yes [provider]  cyclobenzaprine (FLEXERIL) 10 MG tablet Take 10 mg by mouth 3 (three) times daily as needed for muscle spasms.   Yes [provider]  hydrALAZINE (APRESOLINE) 25 MG tablet Take 25 mg by mouth in the morning and at bedtime.   Yes [provider]  oxyCODONE-acetaminophen (PERCOCET/ROXICET) 5-325 MG tablet Take by mouth 2 (two) times daily as needed for severe pain.   Yes [provider]  rosuvastatin (CRESTOR) 20 MG tablet Take 20 mg by mouth at bedtime.   Yes [provider]    Family History Family History  Problem Relation Age of Onset  . Diabetes Father     Social History Social History   Tobacco Use  . Smoking status: Never Smoker  . Smokeless tobacco: Never Used  Substance Use Topics  . Alcohol use: Never  . Drug use: Never     Allergies   Patient has no known allergies.   Review of Systems As per HPI   Physical Exam Triage Vital Signs ED Triage Vitals  Enc Vitals Group     BP      Pulse      Resp      Temp      Temp src  SpO2      Weight      Height      Head Circumference      Peak Flow      Pain Score      Pain Loc      Pain Edu?      Excl. in [place]?    No data found.  Updated Vital Signs BP (!) 153/71 (BP Location: Left Arm)   Pulse 94   Temp 97.7 F (36.5 C) (Temporal)   Resp 18   SpO2 94%   Visual Acuity Right Eye Distance:   Left Eye Distance:   Bilateral Distance:    Right Eye Near:   Left Eye Near:    Bilateral Near:     Physical Exam Constitutional:      General: She is not in acute distress.    Appearance: She is obese. She is not ill-appearing or diaphoretic.  HENT:     Head: Normocephalic and atraumatic.     Mouth/Throat:     Mouth: Mucous membranes are moist.     Pharynx: Oropharynx is clear.    Eyes:     General: No scleral icterus.    Conjunctiva/sclera: Conjunctivae normal.     Pupils: Pupils are equal, round, and reactive to light.  Neck:     Comments: Negative JVD Cardiovascular:     Rate and Rhythm: Normal rate and regular rhythm.     Heart sounds: No murmur. No gallop.   Pulmonary:     Effort: Pulmonary effort is normal. No respiratory distress.     Breath sounds: No wheezing or rales.  Abdominal:     General: Bowel sounds are normal.     Palpations: Abdomen is soft.     Tenderness: There is no abdominal tenderness.  Musculoskeletal:     Cervical back: Neck supple. No tenderness.     Comments: Left leg with 2+ pitting edema to proximal shin.  Left leg circumference 43 cm, right leg circumference 41 cm.  Left popliteal tenderness without appreciable cords.  Left leg positive Homans' sign.  Lymphadenopathy:     Cervical: No cervical adenopathy.  Skin:    Capillary Refill: Capillary refill takes less than 2 seconds.     Coloration: Skin is not jaundiced or pale.  Neurological:     General: No focal deficit present.     Mental Status: She is alert and oriented to person, place, and time.      UC Treatments / Results  Labs (all labs ordered are listed, but only abnormal results are displayed) Labs Reviewed  COMPREHENSIVE METABOLIC PANEL  CBC WITH DIFFERENTIAL/PLATELET    EKG   Radiology No results found.  Procedures Procedures (including critical care time)  Medications Ordered in UC Medications - No data to display  Initial Impression / Assessment and Plan / UC Course  I have reviewed the triage vital signs and the nursing notes.  Pertinent labs & imaging results that were available during my care of the patient were reviewed by me and considered in my medical decision making (see chart for details).     Patient afebrile, nontoxic in office today.  Hemodynamically stable and appears to be well-hydrated.  Diarrhea is intermittent, without alarm  symptoms such as blood, melena, foul odor, or abdominal/rectal pain.  Will have patient monitor symptoms, follow up closely PCP.  This provider coordinated care: Patient has appointment to establish care with a primary care on 03/09/2020 at 3:50 PM: Relayed appointment date/time to daughter  verbalized understanding.  Patient does have left popliteal tenderness with distal extremity edema and positive Homans' sign: DVT US ordered: Patient instructed per vascular tech to go to [facility] for outpatient DVT study.  Daughter able to drive patient straight from clinic.  Reviewed case with Dr. Lanny Cramp who agrees with plan: If DVT positive, may begin Eliquis regimen for provoked DVT as it would likely be second to recent travel.  10 mg Eliquis twice daily x7 days, then 5 mg twice daily with further refills from PCP.  Reviewed with daughter at time of appointment from previous PCP visit approximately 1 month ago significant for WBC 7.8, eosinophil 3.8%, MCV 92.2, hemoglobin 12.9, sodium 143, creatinine 1.98, GFR 23.  We will repeat COMP, CBC to screen for acute changes given patient's symptoms. Final Clinical Impressions(s) / UC Diagnoses   Final diagnoses:  Diarrhea, unspecified type  Chronic kidney disease (CKD), stage IV (severe) (HCC)  Other fatigue  Edema of left lower leg     Discharge Instructions     Please review usual choices as outlined above. Labs pending: We will call you for any concerning abnormalities. Please keep PCP appointment next week for further evaluation and management of chronic medical conditions. Go to ER for any difficulty breathing, chest pain, palpitations, lightheadedness, dizziness, severe abdominal pain.    ED Prescriptions    None     PDMP not reviewed this encounter.   Hall-Potvin, [place], [place] 02/28/20 2017

## 2020-02-28 NOTE — ED Notes (Signed)
Called operator to page vascular tech for STAT study

## 2020-02-28 NOTE — ED Notes (Addendum)
Called operator to page vascular tech again, no response from previous call

## 2020-02-28 NOTE — Discharge Instructions (Addendum)
Please review usual choices as outlined above. Labs pending: We will call you for any concerning abnormalities. Please keep PCP appointment next week for further evaluation and management of chronic medical conditions. Go to ER for any difficulty breathing, chest pain, palpitations, lightheadedness, dizziness, severe abdominal pain.

## 2020-02-28 NOTE — Telephone Encounter (Signed)
Spoke with patient's daughter regarding outpatient DVT study.  States they presented to [facility], main entrance-admissions as directed by vascular tech as time of UC visit. State nobody was present, "says it closed at 5".  Unable to obtain Doppler studies today.  Reviewed with daughter that patient may present to same location at 54 AM for outpatient study.  Forwarded to nursing to call vascular at 8 AM to verify patient can present/see if order needs to be redone.

## 2020-02-29 ENCOUNTER — Other Ambulatory Visit: Payer: Self-pay

## 2020-02-29 ENCOUNTER — Ambulatory Visit (HOSPITAL_COMMUNITY)
Admission: RE | Admit: 2020-02-29 | Discharge: 2020-02-29 | Disposition: A | Discharge status: Home / Self Care | Payer: Medicare HMO | Source: Ambulatory Visit | Attending: Emergency Medicine | Admitting: Emergency Medicine

## 2020-02-29 DIAGNOSIS — M79605 Pain in left leg: Secondary | ICD-10-CM | POA: Diagnosis not present

## 2020-02-29 DIAGNOSIS — M7989 Other specified soft tissue disorders: Secondary | ICD-10-CM

## 2020-02-29 DIAGNOSIS — R600 Localized edema: Secondary | ICD-10-CM | POA: Insufficient documentation

## 2020-02-29 NOTE — Progress Notes (Signed)
Left lower extremity venous duplex completed. Refer to "CV Proc" under chart review to view preliminary results.  02/29/2020 11:21 AM Kelby Aline., MHA, RVT, RDCS, RDMS

## 2020-03-09 ENCOUNTER — Ambulatory Visit (INDEPENDENT_AMBULATORY_CARE_PROVIDER_SITE_OTHER): Payer: Medicare HMO | Admitting: Internal Medicine

## 2020-03-09 DIAGNOSIS — M545 Low back pain: Secondary | ICD-10-CM

## 2020-03-09 DIAGNOSIS — G8929 Other chronic pain: Secondary | ICD-10-CM | POA: Diagnosis not present

## 2020-03-09 DIAGNOSIS — Z7689 Persons encountering health services in other specified circumstances: Secondary | ICD-10-CM | POA: Diagnosis not present

## 2020-03-09 DIAGNOSIS — R6883 Chills (without fever): Secondary | ICD-10-CM | POA: Diagnosis not present

## 2020-03-09 DIAGNOSIS — G4700 Insomnia, unspecified: Secondary | ICD-10-CM | POA: Diagnosis not present

## 2020-03-09 MED ORDER — TRAZODONE HCL 50 MG PO TABS: 25.0000 mg | ORAL_TABLET | Freq: Every evening | ORAL | 3 refills | Status: DC | PRN

## 2020-03-09 NOTE — Progress Notes (Signed)
Virtual Visit via Telephone Note  I connected with Kristin [name], on 03/09/2020 at 3:06 PM by telephone due to the COVID-19 pandemic and verified that I am speaking with the correct person using two identifiers.   Consent: I discussed the limitations, risks, security and privacy concerns of performing an evaluation and management service by telephone and the availability of in person appointments. I also discussed with the patient that there may be a patient responsible charge related to this service. The patient expressed understanding and agreed to proceed.   Location of Patient: Home   Location of Provider: Clinic    Persons participating in Telemedicine visit: [facility] Dr. Juleen China  Patient's Daughter      History of Present Illness: Patient has a visit to establish care.   Has follow up for urgent care visit on 3/22 for diarrhea. At that time diarrhea was intermittent without alarm symptoms. She was hemodynamically stable. Electrolytes were stable. Mild leukocytosis to 14.   Today, patient reports that diarrhea has mostly resolved. Still present occasionally. But does feel better. No correlation with what she is eating or when she eats. Diarrhea when it does occur is watery. No bleeding present. No abdominal pain. No recent changes in medications or new OTC medications.   The past week or two her low back has been bothering her a lot. No recent injury or changes in lifestyle. She had an xray done in [place] and everything was normal per daughter's report. She has been taking Ibuprofen for her back and will only help for about an hour. Tried heating pad and doesn't work. She used Flexeril in the recent past but it made her feel like she was [place]".   Also having trouble staying asleep since Oct. She will go to bed around 9 pm and then be up by 1-2 am. She tried Melatonin without improvement.    Past Medical History:  Diagnosis Date  . Diabetes  mellitus without complication ([place])   . Hypercholesteremia   . Hypertension   . Renal disorder    CKD Stage IV   No Known Allergies  Current Outpatient Medications on File Prior to Visit  Medication Sig Dispense Refill  . amLODipine (NORVASC) 10 MG tablet Take 10 mg by mouth daily.    Marland Kitchen aspirin EC 81 MG tablet Take 81 mg by mouth daily.    . carvedilol (COREG) 12.5 MG tablet Take 12.5 mg by mouth 2 (two) times daily with a meal.    . cholecalciferol (D-VI-SOL) 10 MCG/ML LIQD Take 1,000 Units by mouth in the morning and at bedtime.    . cloNIDine (CATAPRES) 0.1 MG tablet Take 0.1 mg by mouth 2 (two) times daily.    . cyclobenzaprine (FLEXERIL) 10 MG tablet Take 10 mg by mouth 3 (three) times daily as needed for muscle spasms.    . hydrALAZINE (APRESOLINE) 25 MG tablet Take 25 mg by mouth in the morning and at bedtime.    Marland Kitchen oxyCODONE-acetaminophen (PERCOCET/ROXICET) 5-325 MG tablet Take by mouth 2 (two) times daily as needed for severe pain.    . rosuvastatin (CRESTOR) 20 MG tablet Take 20 mg by mouth at bedtime.     No current facility-administered medications on file prior to visit.    Observations/Objective: NAD. Speaking clearly.  Work of breathing normal.  Alert and oriented. Mood appropriate.   Assessment and Plan: 1. Encounter to establish care  2. Insomnia, unspecified type - traZODone (DESYREL) 50 MG tablet; Take 0.5-1  tablets (25-50 mg total) by mouth at bedtime as needed for sleep.  Dispense: 30 tablet; Refill: 3  3. Chronic bilateral low back pain without sciatica Discussed that unsure of etiology given televisit and no prior imaging available for review. History negative for red flag symptoms such as saddle anesthesia, changes in bowel/bladder patterns, fevers. Discussed that given her poor kidney function she is not a good candidate for chronic NSAID use. Advised that Flexeril is on BEERs list and is not typically the best option for patients >65. Counseled there are  some other muscle relaxants not on BEERs list but that all of that class increases risk for fall. She declined trial of another medication due to risk. Advised to use tylenol, heating pad, epsom salt baths, gentle stretches, etc for pain relief. Will follow.    4. Chills (without fever) Patient reports feeling chills each day around 1500 that are short lived. Unclear etiology but doubt malignant etiology. Will obtain some basic labs to rule out potential causes. Patient reports no prior history of cancer. Will ensure she is up to date on appropriate screening at next annual exam. Will also monitor for weight loss or new red flag symptoms that would be concerning for more insidious cause of symptoms.  - TSH; Future - Basic metabolic panel; Future - CBC; Future - ANA; Future   Follow Up Instructions: Lab visit on 4/5    I discussed the assessment and treatment plan with the patient. The patient was provided an opportunity to ask questions and all were answered. The patient agreed with the plan and demonstrated an understanding of the instructions.   The patient was advised to call back or seek an in-person evaluation if the symptoms worsen or if the condition fails to improve as anticipated.     I provided 24 minutes total of non-face-to-face time during this encounter including median intraservice time, reviewing previous notes, investigations, ordering medications, medical decision making, coordinating care and patient verbalized understanding at the end of the visit.    Phill Myron, D.O. Primary Care at [facility]  03/09/2020, 3:06 PM

## 2020-03-13 ENCOUNTER — Other Ambulatory Visit (INDEPENDENT_AMBULATORY_CARE_PROVIDER_SITE_OTHER): Payer: Medicare Other

## 2020-03-13 ENCOUNTER — Telehealth: Payer: Self-pay

## 2020-03-13 ENCOUNTER — Other Ambulatory Visit: Payer: Self-pay

## 2020-03-13 DIAGNOSIS — R6883 Chills (without fever): Secondary | ICD-10-CM

## 2020-03-13 NOTE — Progress Notes (Signed)
Patient here for labs ordered during recent telehealth visit.

## 2020-03-13 NOTE — Telephone Encounter (Signed)
Patient dropped off a form for a stair lift quote from [facility] while getting her blood drawn.  The form appears to only need the patient's signature & $500 deposit.  Attempted to call Christine to see what was needed. Received a message stating that their office will be closed until 03/14/2020 @ 9 AM. Will call office back tomorrow.

## 2020-03-14 LAB — BASIC METABOLIC PANEL
BUN/Creatinine Ratio: 15 (ref 12–28)
BUN: 19 mg/dL (ref 8–27)
CO2: 25 mmol/L (ref 20–29)
Calcium: 10.2 mg/dL (ref 8.7–10.3)
Chloride: 101 mmol/L (ref 96–106)
Creatinine, Ser: 1.24 mg/dL — ABNORMAL HIGH (ref 0.57–1.00)
GFR calc Af Amer: 48 mL/min/{1.73_m2} — ABNORMAL LOW (ref >59)
GFR calc non Af Amer: 41 mL/min/{1.73_m2} — ABNORMAL LOW (ref >59)
Glucose: 105 mg/dL — ABNORMAL HIGH (ref 65–99)
Potassium: 4.4 mmol/L (ref 3.5–5.2)
Sodium: 142 mmol/L (ref 134–144)

## 2020-03-14 LAB — CBC
Hematocrit: 36.5 % (ref 34.0–46.6)
Hemoglobin: 11.4 g/dL (ref 11.1–15.9)
MCH: 29.7 pg (ref 26.6–33.0)
MCHC: 31.2 g/dL — ABNORMAL LOW (ref 31.5–35.7)
MCV: 95 fL (ref 79–97)
Platelets: 425 10*3/uL (ref 150–450)
RBC: 3.84 x10E6/uL (ref 3.77–5.28)
RDW: 12.6 % (ref 11.7–15.4)
WBC: 5.9 10*3/uL (ref 3.4–10.8)

## 2020-03-14 LAB — TSH: TSH: 1.19 u[IU]/mL (ref 0.450–4.500)

## 2020-03-14 LAB — ANA: Anti Nuclear Antibody (ANA): NEGATIVE

## 2020-03-14 NOTE — Telephone Encounter (Signed)
Left voice mail to call back 

## 2020-03-14 NOTE — Telephone Encounter (Signed)
Spoke with [employer].  Patient needs a prescription for a Powered Stair Lift faxed to [facility] [phone])  Please advise.

## 2020-03-15 ENCOUNTER — Other Ambulatory Visit: Payer: Self-pay | Admitting: Internal Medicine

## 2020-03-15 MED ORDER — MISC. DEVICES MISC: 0 refills | Status: DC

## 2020-03-15 NOTE — Telephone Encounter (Signed)
Prescription printed & faxed to [facility].

## 2020-03-16 ENCOUNTER — Telehealth: Payer: Self-pay | Admitting: Internal Medicine

## 2020-03-16 MED ORDER — AMLODIPINE BESYLATE 10 MG PO TABS: 10.0000 mg | ORAL_TABLET | Freq: Every day | ORAL | 1 refills | Status: DC

## 2020-03-16 MED ORDER — ROSUVASTATIN CALCIUM 20 MG PO TABS: 20.0000 mg | ORAL_TABLET | Freq: Every day | ORAL | 1 refills | Status: DC

## 2020-03-16 MED ORDER — CARVEDILOL 12.5 MG PO TABS: 12.5000 mg | ORAL_TABLET | Freq: Two times a day (BID) | ORAL | 1 refills | Status: DC

## 2020-03-16 MED ORDER — CLONIDINE HCL 0.1 MG PO TABS: 0.1000 mg | ORAL_TABLET | Freq: Two times a day (BID) | ORAL | 1 refills | Status: DC

## 2020-03-16 NOTE — Telephone Encounter (Signed)
1) Medication(s) Requested (by name): carvedilol (COREG) 12.5 MG tablet [[id number]]   2) Pharmacy of Choice: [facility] [place] [address]  [address], [place] ([place]) [place] [address]     Approved medications will be sent to pharmacy, we will reach out to you if there is an issue.  Requests made after 3pm may not be addressed until following business day!

## 2020-03-16 NOTE — Telephone Encounter (Signed)
Refills sent

## 2020-03-17 NOTE — Progress Notes (Signed)
Patient notified of results & recommendations. Expressed understanding.

## 2020-03-31 ENCOUNTER — Other Ambulatory Visit: Payer: Self-pay

## 2020-03-31 ENCOUNTER — Emergency Department (HOSPITAL_COMMUNITY): Payer: Medicare Other

## 2020-03-31 ENCOUNTER — Emergency Department (HOSPITAL_COMMUNITY)
Admission: EM | Admit: 2020-03-31 | Discharge: 2020-03-31 | Disposition: A | Discharge status: Home / Self Care | Payer: Medicare Other | Attending: Emergency Medicine | Admitting: Emergency Medicine

## 2020-03-31 ENCOUNTER — Encounter (HOSPITAL_COMMUNITY): Payer: Self-pay

## 2020-03-31 DIAGNOSIS — Z79899 Other long term (current) drug therapy: Secondary | ICD-10-CM | POA: Insufficient documentation

## 2020-03-31 DIAGNOSIS — M545 Low back pain: Secondary | ICD-10-CM | POA: Diagnosis present

## 2020-03-31 DIAGNOSIS — E1122 Type 2 diabetes mellitus with diabetic chronic kidney disease: Secondary | ICD-10-CM | POA: Insufficient documentation

## 2020-03-31 DIAGNOSIS — R109 Unspecified abdominal pain: Secondary | ICD-10-CM | POA: Diagnosis not present

## 2020-03-31 DIAGNOSIS — I129 Hypertensive chronic kidney disease with stage 1 through stage 4 chronic kidney disease, or unspecified chronic kidney disease: Secondary | ICD-10-CM | POA: Insufficient documentation

## 2020-03-31 DIAGNOSIS — M5126 Other intervertebral disc displacement, lumbar region: Secondary | ICD-10-CM | POA: Insufficient documentation

## 2020-03-31 DIAGNOSIS — N184 Chronic kidney disease, stage 4 (severe): Secondary | ICD-10-CM | POA: Insufficient documentation

## 2020-03-31 DIAGNOSIS — Z7982 Long term (current) use of aspirin: Secondary | ICD-10-CM | POA: Diagnosis not present

## 2020-03-31 LAB — CBC WITH DIFFERENTIAL/PLATELET
Abs Immature Granulocytes: 0.03 10*3/uL (ref 0.00–0.07)
Basophils Absolute: 0 10*3/uL (ref 0.0–0.1)
Basophils Relative: 0 %
Eosinophils Absolute: 0.2 10*3/uL (ref 0.0–0.5)
Eosinophils Relative: 2 %
HCT: 35 % — ABNORMAL LOW (ref 36.0–46.0)
Hemoglobin: 11 g/dL — ABNORMAL LOW (ref 12.0–15.0)
Immature Granulocytes: 0 %
Lymphocytes Relative: 25 %
Lymphs Abs: 2.2 10*3/uL (ref 0.7–4.0)
MCH: 30.1 pg (ref 26.0–34.0)
MCHC: 31.4 g/dL (ref 30.0–36.0)
MCV: 95.9 fL (ref 80.0–100.0)
Monocytes Absolute: 1.1 10*3/uL — ABNORMAL HIGH (ref 0.1–1.0)
Monocytes Relative: 12 %
Neutro Abs: 5.5 10*3/uL (ref 1.7–7.7)
Neutrophils Relative %: 61 %
Platelets: 281 10*3/uL (ref 150–400)
RBC: 3.65 MIL/uL — ABNORMAL LOW (ref 3.87–5.11)
RDW: 13.1 % (ref 11.5–15.5)
WBC: 9 10*3/uL (ref 4.0–10.5)
nRBC: 0 % (ref 0.0–0.2)

## 2020-03-31 LAB — URINALYSIS, ROUTINE W REFLEX MICROSCOPIC
Bilirubin Urine: NEGATIVE
Glucose, UA: NEGATIVE mg/dL
Hgb urine dipstick: NEGATIVE
Ketones, ur: NEGATIVE mg/dL
Leukocytes,Ua: NEGATIVE
Nitrite: NEGATIVE
Protein, ur: NEGATIVE mg/dL
Specific Gravity, Urine: 1.008 (ref 1.005–1.030)
pH: 7 (ref 5.0–8.0)

## 2020-03-31 LAB — BASIC METABOLIC PANEL
Anion gap: 10 (ref 5–15)
BUN: 17 mg/dL (ref 8–23)
CO2: 27 mmol/L (ref 22–32)
Calcium: 9.7 mg/dL (ref 8.9–10.3)
Chloride: 105 mmol/L (ref 98–111)
Creatinine, Ser: 1.06 mg/dL — ABNORMAL HIGH (ref 0.44–1.00)
GFR calc Af Amer: 58 mL/min — ABNORMAL LOW (ref >60)
GFR calc non Af Amer: 50 mL/min — ABNORMAL LOW (ref >60)
Glucose, Bld: 109 mg/dL — ABNORMAL HIGH (ref 70–99)
Potassium: 3.8 mmol/L (ref 3.5–5.1)
Sodium: 142 mmol/L (ref 135–145)

## 2020-03-31 MED ORDER — ONDANSETRON HCL 4 MG/2ML IJ SOLN
4.0000 mg | Freq: Once | INTRAMUSCULAR | Status: AC
  Administered 2020-03-31: 4 mg via INTRAVENOUS
  Filled 2020-03-31: qty 2

## 2020-03-31 MED ORDER — MORPHINE SULFATE (PF) 4 MG/ML IV SOLN
4.0000 mg | Freq: Once | INTRAVENOUS | Status: AC
  Administered 2020-03-31: 4 mg via INTRAVENOUS
  Filled 2020-03-31: qty 1

## 2020-03-31 MED ORDER — SODIUM CHLORIDE 0.9 % IV BOLUS
500.0000 mL | Freq: Once | INTRAVENOUS | Status: AC
  Administered 2020-03-31: 500 mL via INTRAVENOUS

## 2020-03-31 MED ORDER — PREDNISONE 10 MG PO TABS: 20.0000 mg | ORAL_TABLET | Freq: Two times a day (BID) | ORAL | 0 refills | Status: DC

## 2020-03-31 MED ORDER — HYDROCODONE-ACETAMINOPHEN 5-325 MG PO TABS: 1.0000 | ORAL_TABLET | Freq: Four times a day (QID) | ORAL | 0 refills | Status: DC | PRN

## 2020-03-31 NOTE — ED Triage Notes (Signed)
Patient c/o left lower back pain x 4 days.

## 2020-03-31 NOTE — Discharge Instructions (Addendum)
Begin taking prednisone as prescribed.  Take hydrocodone as prescribed as needed for pain.  Follow-up with neurosurgery in the next week.  The contact information for Dr. Annette Stable has been provided in this discharge summary for you to call and make these arrangements.

## 2020-03-31 NOTE — ED Provider Notes (Signed)
[facility] DEPT Provider Note   CSN: [id number] Arrival date & time: 03/31/20  0865     History Chief Complaint  Patient presents with  . Back Pain    Kristin [name] is a 79 y.o. female.  Patient is a 79 year old female with past medical history of diabetes, hypertension, hyperlipidemia, and chronic renal insufficiency.  She presents today for evaluation of back/flank pain.  This began approximately 4 days ago in the absence of any injury or trauma.  She describes a sharp pain to the left lumbar region that radiates to her left flank.  The pain is present all the time, but worse when she attempts to walk or move.  She denies radiation into her legs.  She denies any bowel or bladder complaints.  The patient's family member at bedside reports that she has also been feeling weak with decreased appetite and is concerned about her kidney function.  The history is provided by the patient.  Back Pain Location:  Lumbar spine Quality:  Stabbing Radiates to:  Does not radiate Pain severity:  Moderate Duration:  4 days Timing:  Constant Progression:  Worsening Chronicity:  New Context: not recent injury   Relieved by:  Nothing Worsened by:  Palpation and movement      Past Medical History:  Diagnosis Date  . Diabetes mellitus without complication ([place])   . Hypercholesteremia   . Hypertension   . Renal disorder    CKD Stage IV    There are no problems to display for this patient.   History reviewed. No pertinent surgical history.   OB History   No obstetric history on file.     Family History  Problem Relation Age of Onset  . Diabetes Father     Social History   Tobacco Use  . Smoking status: Never Smoker  . Smokeless tobacco: Never Used  Substance Use Topics  . Alcohol use: Never  . Drug use: Never    Home Medications Prior to Admission medications   Medication Sig Start Date End Date Taking? Authorizing Provider    amLODipine (NORVASC) 10 MG tablet Take 1 tablet (10 mg total) by mouth daily. 03/16/20  Yes Nicolette Bang, DO  aspirin EC 81 MG tablet Take 81 mg by mouth daily.   Yes [provider]  carvedilol (COREG) 12.5 MG tablet Take 1 tablet (12.5 mg total) by mouth 2 (two) times daily with a meal. 03/16/20  Yes Nicolette Bang, DO  cholecalciferol (VITAMIN D) 25 MCG (1000 UNIT) tablet Take 3,000 Units by mouth daily.   Yes [provider]  cloNIDine (CATAPRES) 0.1 MG tablet Take 1 tablet (0.1 mg total) by mouth 2 (two) times daily. 03/16/20  Yes Nicolette Bang, DO  cyclobenzaprine (FLEXERIL) 10 MG tablet Take 10 mg by mouth 3 (three) times daily as needed for muscle spasms.   Yes [provider]  hydrALAZINE (APRESOLINE) 25 MG tablet Take 25 mg by mouth in the morning and at bedtime.   Yes [provider]  oxyCODONE-acetaminophen (PERCOCET/ROXICET) 5-325 MG tablet Take 1 tablet by mouth 2 (two) times daily as needed for severe pain.   Yes [provider]  rosuvastatin (CRESTOR) 20 MG tablet Take 1 tablet (20 mg total) by mouth at bedtime. 03/16/20  Yes Nicolette Bang, DO  Misc. Devices MISC Please dispense one power stair lift for at home daily use prn debility. 03/15/20   Nicolette Bang, DO  traZODone (DESYREL) 50 MG tablet  Take 0.5-1 tablets (25-50 mg total) by mouth at bedtime as needed for sleep. Patient not taking: Reported on 03/31/2020 03/09/20   Nicolette Bang, DO    Allergies    Patient has no known allergies.  Review of Systems   Review of Systems  Musculoskeletal: Positive for back pain.  All other systems reviewed and are negative.   Physical Exam Updated Vital Signs BP (!) 178/90 (BP Location: Left Arm)   Pulse 84   Temp 98.5 F (36.9 C) (Oral)   Resp 14   Ht 5\' 4"  (1.626 m)   Wt 86.6 kg   SpO2 98%   BMI 32.79 kg/m   Physical Exam Vitals and nursing note reviewed.   Constitutional:      General: She is not in acute distress.    Appearance: She is well-developed. She is not diaphoretic.  HENT:     Head: Normocephalic and atraumatic.  Cardiovascular:     Rate and Rhythm: Normal rate and regular rhythm.     Heart sounds: No murmur. No friction rub. No gallop.   Pulmonary:     Effort: Pulmonary effort is normal. No respiratory distress.     Breath sounds: Normal breath sounds. No wheezing.  Abdominal:     General: Bowel sounds are normal. There is no distension.     Palpations: Abdomen is soft.     Tenderness: There is no abdominal tenderness.  Musculoskeletal:        General: Normal range of motion.     Cervical back: Normal range of motion and neck supple.     Comments: There is tenderness to palpation in the left lower lumbar region.  There is no bony tenderness or step-off.  Skin:    General: Skin is warm and dry.  Neurological:     Mental Status: She is alert and oriented to person, place, and time.     Comments: Strength is 5 out of 5 in both lower extremities.  DTRs are trace and symmetrical in the patellar and Achilles tendons bilaterally.  Sensation is intact throughout both lower legs.     ED Results / Procedures / Treatments   Labs (all labs ordered are listed, but only abnormal results are displayed) Labs Reviewed  BASIC METABOLIC PANEL  CBC WITH DIFFERENTIAL/PLATELET  URINALYSIS, ROUTINE W REFLEX MICROSCOPIC    EKG None  Radiology No results found.  Procedures Procedures (including critical care time)  Medications Ordered in ED Medications  sodium chloride 0.9 % bolus 500 mL (has no administration in time range)  morphine 4 MG/ML injection 4 mg (has no administration in time range)  ondansetron (ZOFRAN) injection 4 mg (has no administration in time range)    ED Course  I have reviewed the triage vital signs and the nursing notes.  Pertinent labs & imaging results that were available during my care of the patient  were reviewed by me and considered in my medical decision making (see chart for details).    MDM Rules/Calculators/A&P  Patient presenting here with complaints of pain in her low back.  This appears to be related to degenerative changes with a disc extrusion at L2-L3.  Patient is neurologically intact and having no bowel or bladder complaints.  Her pain was treated and she appears to be feeling better.  At this point, I feel as though discharge with prednisone, pain medication, and follow-up with neurosurgery is appropriate.  She is to return as needed for any problems.  Final Clinical Impression(s) /  ED Diagnoses Final diagnoses:  None    Rx / DC Orders ED Discharge Orders    None       Veryl Speak, MD 03/31/20 1056

## 2020-04-12 ENCOUNTER — Other Ambulatory Visit: Payer: Self-pay | Admitting: Neurosurgery

## 2020-04-12 DIAGNOSIS — M5416 Radiculopathy, lumbar region: Secondary | ICD-10-CM

## 2020-04-12 DIAGNOSIS — M5412 Radiculopathy, cervical region: Secondary | ICD-10-CM

## 2020-04-13 ENCOUNTER — Other Ambulatory Visit: Payer: Self-pay | Admitting: Internal Medicine

## 2020-04-13 ENCOUNTER — Telehealth: Payer: Self-pay | Admitting: Internal Medicine

## 2020-04-13 NOTE — Telephone Encounter (Signed)
Pt came in the office stating she needed a medication to keep her calm during MRI

## 2020-04-13 NOTE — Telephone Encounter (Signed)
She will need to request that from the ordering medical provider as I'm not the doctor that ordered or scheduled her MRI.   Phill Myron, D.O. Primary Care at [facility]  04/13/2020, 3:12 PM

## 2020-04-13 NOTE — Telephone Encounter (Signed)
INFORMED PT

## 2020-04-23 ENCOUNTER — Other Ambulatory Visit: Payer: Medicare Other

## 2020-04-23 ENCOUNTER — Other Ambulatory Visit: Payer: Self-pay

## 2020-04-23 ENCOUNTER — Ambulatory Visit
Admission: RE | Admit: 2020-04-23 | Discharge: 2020-04-23 | Disposition: A | Discharge status: Home / Self Care | Payer: Medicare Other | Source: Ambulatory Visit | Attending: Neurosurgery | Admitting: Neurosurgery

## 2020-04-23 DIAGNOSIS — M5416 Radiculopathy, lumbar region: Secondary | ICD-10-CM

## 2020-04-23 DIAGNOSIS — M48061 Spinal stenosis, lumbar region without neurogenic claudication: Secondary | ICD-10-CM | POA: Diagnosis not present

## 2020-04-25 DIAGNOSIS — M5416 Radiculopathy, lumbar region: Secondary | ICD-10-CM | POA: Diagnosis not present

## 2020-04-25 DIAGNOSIS — Z6833 Body mass index (BMI) 33.0-33.9, adult: Secondary | ICD-10-CM | POA: Diagnosis not present

## 2020-04-25 DIAGNOSIS — I10 Essential (primary) hypertension: Secondary | ICD-10-CM | POA: Diagnosis not present

## 2020-05-11 ENCOUNTER — Ambulatory Visit: Payer: Medicare HMO | Admitting: Internal Medicine

## 2020-05-18 DIAGNOSIS — M48062 Spinal stenosis, lumbar region with neurogenic claudication: Secondary | ICD-10-CM | POA: Diagnosis not present

## 2020-05-18 DIAGNOSIS — Z6825 Body mass index (BMI) 25.0-25.9, adult: Secondary | ICD-10-CM | POA: Diagnosis not present

## 2020-05-18 DIAGNOSIS — M5416 Radiculopathy, lumbar region: Secondary | ICD-10-CM | POA: Diagnosis not present

## 2020-06-06 DIAGNOSIS — Z7409 Other reduced mobility: Secondary | ICD-10-CM | POA: Diagnosis not present

## 2020-06-06 DIAGNOSIS — N289 Disorder of kidney and ureter, unspecified: Secondary | ICD-10-CM | POA: Diagnosis not present

## 2020-06-06 DIAGNOSIS — E782 Mixed hyperlipidemia: Secondary | ICD-10-CM | POA: Diagnosis not present

## 2020-06-06 DIAGNOSIS — I10 Essential (primary) hypertension: Secondary | ICD-10-CM | POA: Diagnosis not present

## 2020-06-06 DIAGNOSIS — G8929 Other chronic pain: Secondary | ICD-10-CM | POA: Diagnosis not present

## 2020-06-06 DIAGNOSIS — M549 Dorsalgia, unspecified: Secondary | ICD-10-CM | POA: Diagnosis not present

## 2020-06-06 DIAGNOSIS — E118 Type 2 diabetes mellitus with unspecified complications: Secondary | ICD-10-CM | POA: Diagnosis not present

## 2020-06-06 DIAGNOSIS — R946 Abnormal results of thyroid function studies: Secondary | ICD-10-CM | POA: Diagnosis not present

## 2020-06-06 DIAGNOSIS — G894 Chronic pain syndrome: Secondary | ICD-10-CM | POA: Diagnosis not present

## 2020-06-07 DIAGNOSIS — M48062 Spinal stenosis, lumbar region with neurogenic claudication: Secondary | ICD-10-CM | POA: Diagnosis not present

## 2020-06-07 DIAGNOSIS — M5416 Radiculopathy, lumbar region: Secondary | ICD-10-CM | POA: Diagnosis not present

## 2020-06-10 ENCOUNTER — Other Ambulatory Visit: Payer: Self-pay

## 2020-06-10 ENCOUNTER — Emergency Department (HOSPITAL_COMMUNITY)
Admission: EM | Admit: 2020-06-10 | Discharge: 2020-06-11 | Disposition: A | Discharge status: Home / Self Care | Payer: Medicare HMO | Attending: Emergency Medicine | Admitting: Emergency Medicine

## 2020-06-10 DIAGNOSIS — M542 Cervicalgia: Secondary | ICD-10-CM | POA: Diagnosis not present

## 2020-06-10 DIAGNOSIS — N184 Chronic kidney disease, stage 4 (severe): Secondary | ICD-10-CM | POA: Diagnosis not present

## 2020-06-10 DIAGNOSIS — Z7982 Long term (current) use of aspirin: Secondary | ICD-10-CM | POA: Insufficient documentation

## 2020-06-10 DIAGNOSIS — I129 Hypertensive chronic kidney disease with stage 1 through stage 4 chronic kidney disease, or unspecified chronic kidney disease: Secondary | ICD-10-CM | POA: Diagnosis not present

## 2020-06-10 DIAGNOSIS — E1122 Type 2 diabetes mellitus with diabetic chronic kidney disease: Secondary | ICD-10-CM | POA: Diagnosis not present

## 2020-06-10 DIAGNOSIS — Z79899 Other long term (current) drug therapy: Secondary | ICD-10-CM | POA: Diagnosis not present

## 2020-06-10 MED ORDER — OXYCODONE-ACETAMINOPHEN 5-325 MG PO TABS
1.0000 | ORAL_TABLET | Freq: Once | ORAL | Status: AC
  Administered 2020-06-11: 1 via ORAL
  Filled 2020-06-10: qty 1

## 2020-06-10 MED ORDER — DEXAMETHASONE SODIUM PHOSPHATE 10 MG/ML IJ SOLN
8.0000 mg | Freq: Once | INTRAMUSCULAR | Status: AC
  Administered 2020-06-10: 8 mg via INTRAMUSCULAR
  Filled 2020-06-10: qty 1

## 2020-06-10 MED ORDER — MORPHINE SULFATE (PF) 4 MG/ML IV SOLN
4.0000 mg | Freq: Once | INTRAVENOUS | Status: AC
  Administered 2020-06-10: 4 mg via INTRAVENOUS
  Filled 2020-06-10: qty 1

## 2020-06-10 NOTE — ED Triage Notes (Signed)
Patient c/o neck pain for months, worsening with headache x4 days. Denies fever.

## 2020-06-10 NOTE — ED Provider Notes (Signed)
[facility] DEPT Provider Note   CSN: [id number] Arrival date & time: 06/10/20  1900     History Chief Complaint  Patient presents with  . Neck Pain    Kristin [name] is a 79 y.o. female.  Patient is a 79 year old female with a history of diabetes, hyperlipidemia, hypertension and chronic kidney disease who presents with neck pain.  She has had some pain in her neck and low back since January.  She had an MRI of her lumbar spine and some injections in her lumbar spine which has improved that pain but her neck pain has been getting worse.  She said it started in January and has been off and on since then but over the last 5 days is gotten worse.  She denies any recent injuries.  She says is the same type of pain that she has been having since January but just worse now.  It is across both sides of her cervical spine.  There is no radiation down her back.  No radiation down her arms.  No numbness or weakness in her extremities.  No fevers.  She has been taking over-the-counter medications with no relief.  She says she has prescriptions for oxycodone and a muscle relaxer but those do not seem to help her.  She said she got injections in January which seemed to help.  She has been seen by Dr. Trenton Gammon with neurosurgery.  Apparently her insurance did not approve the MRI of her cervical spine when she had the one done of her lumbar spine.  She says her pain is worse with movement of her neck.        Past Medical History:  Diagnosis Date  . Diabetes mellitus without complication ([place])   . Hypercholesteremia   . Hypertension   . Renal disorder    CKD Stage IV    There are no problems to display for this patient.   No past surgical history on file.   OB History   No obstetric history on file.     Family History  Problem Relation Age of Onset  . Diabetes Father     Social History   Tobacco Use  . Smoking status: Never Smoker  . Smokeless tobacco:  Never Used  Vaping Use  . Vaping Use: Never used  Substance Use Topics  . Alcohol use: Never  . Drug use: Never    Home Medications Prior to Admission medications   Medication Sig Start Date End Date Taking? Authorizing Provider  amLODipine (NORVASC) 10 MG tablet Take 1 tablet (10 mg total) by mouth daily. 03/16/20   Nicolette Bang, DO  aspirin EC 81 MG tablet Take 81 mg by mouth daily.    [provider]  carvedilol (COREG) 12.5 MG tablet Take 1 tablet (12.5 mg total) by mouth 2 (two) times daily with a meal. 03/16/20   Nicolette Bang, DO  cholecalciferol (VITAMIN D) 25 MCG (1000 UNIT) tablet Take 3,000 Units by mouth daily.    [provider]  cloNIDine (CATAPRES) 0.1 MG tablet Take 1 tablet (0.1 mg total) by mouth 2 (two) times daily. 03/16/20   Nicolette Bang, DO  cyclobenzaprine (FLEXERIL) 10 MG tablet Take 10 mg by mouth 3 (three) times daily as needed for muscle spasms.    [provider]  hydrALAZINE (APRESOLINE) 25 MG tablet Take 25 mg by mouth in the morning and at bedtime.    [provider]  HYDROcodone-acetaminophen (NORCO/VICODIN) 5-325 MG  tablet Take 1-2 tablets by mouth every 6 (six) hours as needed. 03/31/20   Veryl Speak, MD  Misc. Devices MISC Please dispense one power stair lift for at home daily use prn debility. 03/15/20   Nicolette Bang, DO  oxyCODONE-acetaminophen (PERCOCET/ROXICET) 5-325 MG tablet Take 1 tablet by mouth 2 (two) times daily as needed for severe pain.    [provider]  predniSONE (DELTASONE) 10 MG tablet Take 2 tablets (20 mg total) by mouth 2 (two) times daily with a meal. 03/31/20   Veryl Speak, MD  rosuvastatin (CRESTOR) 20 MG tablet Take 1 tablet (20 mg total) by mouth at bedtime. 03/16/20   Nicolette Bang, DO  traZODone (DESYREL) 50 MG tablet Take 0.5-1 tablets (25-50 mg total) by mouth at bedtime as needed for sleep. Patient not taking: Reported on  03/31/2020 03/09/20   Nicolette Bang, DO    Allergies    Patient has no known allergies.  Review of Systems   Review of Systems  Constitutional: Negative for chills, diaphoresis, fatigue and fever.  HENT: Negative for congestion, rhinorrhea and sneezing.   Eyes: Negative.   Respiratory: Negative for cough, chest tightness and shortness of breath.   Cardiovascular: Negative for chest pain and leg swelling.  Gastrointestinal: Negative for abdominal pain, blood in stool, diarrhea, nausea and vomiting.  Genitourinary: Negative for difficulty urinating, flank pain, frequency and hematuria.  Musculoskeletal: Positive for neck pain. Negative for arthralgias and back pain.  Skin: Negative for rash.  Neurological: Negative for dizziness, speech difficulty, weakness, numbness and headaches.    Physical Exam Updated Vital Signs BP (!) 176/85 (BP Location: Right Arm)   Pulse 73   Temp 98.6 F (37 C) (Oral)   Resp 17   Ht 5\' 4"  (1.626 m)   Wt 83.9 kg   SpO2 100%   BMI 31.76 kg/m   Physical Exam Constitutional:      Appearance: She is well-developed.  HENT:     Head: Normocephalic and atraumatic.  Eyes:     Pupils: Pupils are equal, round, and reactive to light.  Cardiovascular:     Rate and Rhythm: Normal rate and regular rhythm.     Heart sounds: Normal heart sounds.  Pulmonary:     Effort: Pulmonary effort is normal. No respiratory distress.     Breath sounds: Normal breath sounds. No wheezing or rales.  Chest:     Chest wall: No tenderness.  Abdominal:     General: Bowel sounds are normal.     Palpations: Abdomen is soft.     Tenderness: There is no abdominal tenderness. There is no guarding or rebound.  Musculoskeletal:        General: Normal range of motion.     Cervical back: Normal range of motion and neck supple.     Comments: Positive tenderness to the mid and lower cervical spine.  There is also tenderness along the paraspinal muscles bilaterally.  Normal  sensation and motor function to her extremities, pedal pulses are intact.  Lymphadenopathy:     Cervical: No cervical adenopathy.  Skin:    General: Skin is warm and dry.     Findings: No rash.  Neurological:     Mental Status: She is alert and oriented to person, place, and time.     ED Results / Procedures / Treatments   Labs (all labs ordered are listed, but only abnormal results are displayed) Labs Reviewed - No data to display  EKG None  Radiology No results found.  Procedures Procedures (including critical care time)  Medications Ordered in ED Medications  oxyCODONE-acetaminophen (PERCOCET/ROXICET) 5-325 MG per tablet 1 tablet (has no administration in time range)  morphine 4 MG/ML injection 4 mg (4 mg Intravenous Given 06/10/20 2109)  dexamethasone (DECADRON) injection 8 mg (8 mg Intramuscular Given 06/10/20 2104)    ED Course  I have reviewed the triage vital signs and the nursing notes.  Pertinent labs & imaging results that were available during my care of the patient were reviewed by me and considered in my medical decision making (see chart for details).    MDM Rules/Calculators/A&P                          Patient is a 79 year old female who presents with neck pain. It seems to be musculoskeletal in nature. She has had similar symptoms over the last several months. She has no neurologic deficits. No recent trauma which would warrant imaging tonight. She likely will need to have an outpatient MRI. She does have a neurosurgeon that has already evaluated her back pain. She sees Dr. Trenton Gammon. She has no fever or other concerns for infection. She was given a dose of Decadron and morphine in the ED. She is feeling better after that. She has pain medicine at home to use. She was discharged home in good condition. She was encouraged to follow-up with Dr. Trenton Gammon. Return precautions were given. Final Clinical Impression(s) / ED Diagnoses Final diagnoses:  Neck pain    Rx /  DC Orders ED Discharge Orders    None       Malvin Johns, MD 06/10/20 2320

## 2020-07-05 DIAGNOSIS — M5412 Radiculopathy, cervical region: Secondary | ICD-10-CM | POA: Diagnosis not present

## 2020-07-14 DIAGNOSIS — E782 Mixed hyperlipidemia: Secondary | ICD-10-CM | POA: Diagnosis not present

## 2020-07-14 DIAGNOSIS — G894 Chronic pain syndrome: Secondary | ICD-10-CM | POA: Diagnosis not present

## 2020-07-14 DIAGNOSIS — E118 Type 2 diabetes mellitus with unspecified complications: Secondary | ICD-10-CM | POA: Diagnosis not present

## 2020-07-14 DIAGNOSIS — N1832 Chronic kidney disease, stage 3b: Secondary | ICD-10-CM | POA: Diagnosis not present

## 2020-07-14 DIAGNOSIS — I10 Essential (primary) hypertension: Secondary | ICD-10-CM | POA: Diagnosis not present

## 2020-07-14 DIAGNOSIS — R221 Localized swelling, mass and lump, neck: Secondary | ICD-10-CM | POA: Diagnosis not present

## 2020-07-18 ENCOUNTER — Other Ambulatory Visit: Payer: Self-pay | Admitting: Family Medicine

## 2020-07-18 DIAGNOSIS — R221 Localized swelling, mass and lump, neck: Secondary | ICD-10-CM

## 2020-08-01 ENCOUNTER — Other Ambulatory Visit: Payer: Self-pay | Admitting: Family Medicine

## 2020-08-01 ENCOUNTER — Ambulatory Visit
Admission: RE | Admit: 2020-08-01 | Discharge: 2020-08-01 | Disposition: A | Discharge status: Home / Self Care | Payer: Medicare HMO | Source: Ambulatory Visit | Attending: Family Medicine | Admitting: Family Medicine

## 2020-08-01 DIAGNOSIS — R221 Localized swelling, mass and lump, neck: Secondary | ICD-10-CM

## 2020-08-01 DIAGNOSIS — E042 Nontoxic multinodular goiter: Secondary | ICD-10-CM | POA: Diagnosis not present

## 2020-08-06 ENCOUNTER — Ambulatory Visit
Admission: RE | Admit: 2020-08-06 | Discharge: 2020-08-06 | Disposition: A | Discharge status: Home / Self Care | Payer: Medicare HMO | Source: Ambulatory Visit | Attending: Neurosurgery | Admitting: Neurosurgery

## 2020-08-06 DIAGNOSIS — M5412 Radiculopathy, cervical region: Secondary | ICD-10-CM

## 2020-08-06 DIAGNOSIS — M4802 Spinal stenosis, cervical region: Secondary | ICD-10-CM | POA: Diagnosis not present

## 2020-08-08 DIAGNOSIS — M48062 Spinal stenosis, lumbar region with neurogenic claudication: Secondary | ICD-10-CM | POA: Diagnosis not present

## 2020-08-11 DIAGNOSIS — Z0100 Encounter for examination of eyes and vision without abnormal findings: Secondary | ICD-10-CM | POA: Diagnosis not present

## 2020-08-11 DIAGNOSIS — H524 Presbyopia: Secondary | ICD-10-CM | POA: Diagnosis not present

## 2020-08-15 DIAGNOSIS — E118 Type 2 diabetes mellitus with unspecified complications: Secondary | ICD-10-CM | POA: Diagnosis not present

## 2020-08-15 DIAGNOSIS — Z23 Encounter for immunization: Secondary | ICD-10-CM | POA: Diagnosis not present

## 2020-08-15 DIAGNOSIS — R499 Unspecified voice and resonance disorder: Secondary | ICD-10-CM | POA: Diagnosis not present

## 2020-08-15 DIAGNOSIS — I10 Essential (primary) hypertension: Secondary | ICD-10-CM | POA: Diagnosis not present

## 2020-08-15 DIAGNOSIS — E782 Mixed hyperlipidemia: Secondary | ICD-10-CM | POA: Diagnosis not present

## 2020-08-17 ENCOUNTER — Other Ambulatory Visit: Payer: Self-pay

## 2020-08-17 ENCOUNTER — Encounter (HOSPITAL_COMMUNITY): Payer: Self-pay | Admitting: *Deleted

## 2020-08-17 DIAGNOSIS — I214 Non-ST elevation (NSTEMI) myocardial infarction: Secondary | ICD-10-CM | POA: Diagnosis not present

## 2020-08-17 DIAGNOSIS — E669 Obesity, unspecified: Secondary | ICD-10-CM | POA: Diagnosis present

## 2020-08-17 DIAGNOSIS — E875 Hyperkalemia: Secondary | ICD-10-CM | POA: Diagnosis not present

## 2020-08-17 DIAGNOSIS — M25461 Effusion, right knee: Secondary | ICD-10-CM | POA: Diagnosis not present

## 2020-08-17 DIAGNOSIS — I2510 Atherosclerotic heart disease of native coronary artery without angina pectoris: Secondary | ICD-10-CM | POA: Diagnosis present

## 2020-08-17 DIAGNOSIS — R278 Other lack of coordination: Secondary | ICD-10-CM | POA: Diagnosis not present

## 2020-08-17 DIAGNOSIS — R29703 NIHSS score 3: Secondary | ICD-10-CM | POA: Diagnosis not present

## 2020-08-17 DIAGNOSIS — I6340 Cerebral infarction due to embolism of unspecified cerebral artery: Secondary | ICD-10-CM | POA: Diagnosis not present

## 2020-08-17 DIAGNOSIS — K5900 Constipation, unspecified: Secondary | ICD-10-CM | POA: Diagnosis present

## 2020-08-17 DIAGNOSIS — Z79899 Other long term (current) drug therapy: Secondary | ICD-10-CM

## 2020-08-17 DIAGNOSIS — I10 Essential (primary) hypertension: Secondary | ICD-10-CM | POA: Diagnosis not present

## 2020-08-17 DIAGNOSIS — E1122 Type 2 diabetes mellitus with diabetic chronic kidney disease: Secondary | ICD-10-CM | POA: Diagnosis present

## 2020-08-17 DIAGNOSIS — I5021 Acute systolic (congestive) heart failure: Secondary | ICD-10-CM | POA: Diagnosis not present

## 2020-08-17 DIAGNOSIS — N1832 Chronic kidney disease, stage 3b: Secondary | ICD-10-CM | POA: Diagnosis present

## 2020-08-17 DIAGNOSIS — Z6835 Body mass index (BMI) 35.0-35.9, adult: Secondary | ICD-10-CM

## 2020-08-17 DIAGNOSIS — R531 Weakness: Secondary | ICD-10-CM | POA: Diagnosis not present

## 2020-08-17 DIAGNOSIS — A419 Sepsis, unspecified organism: Secondary | ICD-10-CM | POA: Diagnosis not present

## 2020-08-17 DIAGNOSIS — I130 Hypertensive heart and chronic kidney disease with heart failure and stage 1 through stage 4 chronic kidney disease, or unspecified chronic kidney disease: Secondary | ICD-10-CM | POA: Diagnosis present

## 2020-08-17 DIAGNOSIS — G8929 Other chronic pain: Secondary | ICD-10-CM | POA: Diagnosis present

## 2020-08-17 DIAGNOSIS — J9601 Acute respiratory failure with hypoxia: Secondary | ICD-10-CM | POA: Diagnosis not present

## 2020-08-17 DIAGNOSIS — E876 Hypokalemia: Secondary | ICD-10-CM | POA: Diagnosis not present

## 2020-08-17 DIAGNOSIS — M5442 Lumbago with sciatica, left side: Secondary | ICD-10-CM | POA: Diagnosis present

## 2020-08-17 DIAGNOSIS — Z833 Family history of diabetes mellitus: Secondary | ICD-10-CM

## 2020-08-17 DIAGNOSIS — R339 Retention of urine, unspecified: Secondary | ICD-10-CM | POA: Diagnosis not present

## 2020-08-17 DIAGNOSIS — M11262 Other chondrocalcinosis, left knee: Secondary | ICD-10-CM | POA: Diagnosis present

## 2020-08-17 DIAGNOSIS — I480 Paroxysmal atrial fibrillation: Secondary | ICD-10-CM | POA: Diagnosis not present

## 2020-08-17 DIAGNOSIS — M48061 Spinal stenosis, lumbar region without neurogenic claudication: Principal | ICD-10-CM | POA: Diagnosis present

## 2020-08-17 DIAGNOSIS — Z20822 Contact with and (suspected) exposure to covid-19: Secondary | ICD-10-CM | POA: Diagnosis present

## 2020-08-17 DIAGNOSIS — N179 Acute kidney failure, unspecified: Secondary | ICD-10-CM | POA: Diagnosis not present

## 2020-08-17 DIAGNOSIS — E871 Hypo-osmolality and hyponatremia: Secondary | ICD-10-CM | POA: Diagnosis not present

## 2020-08-17 DIAGNOSIS — M11261 Other chondrocalcinosis, right knee: Secondary | ICD-10-CM | POA: Diagnosis present

## 2020-08-17 DIAGNOSIS — I429 Cardiomyopathy, unspecified: Secondary | ICD-10-CM | POA: Diagnosis present

## 2020-08-17 DIAGNOSIS — R509 Fever, unspecified: Secondary | ICD-10-CM | POA: Diagnosis not present

## 2020-08-17 DIAGNOSIS — T502X5A Adverse effect of carbonic-anhydrase inhibitors, benzothiadiazides and other diuretics, initial encounter: Secondary | ICD-10-CM | POA: Diagnosis not present

## 2020-08-17 DIAGNOSIS — H538 Other visual disturbances: Secondary | ICD-10-CM | POA: Diagnosis not present

## 2020-08-17 DIAGNOSIS — R52 Pain, unspecified: Secondary | ICD-10-CM | POA: Diagnosis not present

## 2020-08-17 DIAGNOSIS — M5441 Lumbago with sciatica, right side: Secondary | ICD-10-CM | POA: Diagnosis present

## 2020-08-17 NOTE — ED Triage Notes (Signed)
Pt states that she is due to have back surgery. She has had pain all throughout her body and "two lumps inside my legs". She denied fevers at home.

## 2020-08-17 NOTE — ED Triage Notes (Signed)
Pt arrives via [facility] from home with c/o pain all over. Due to have back surgery in a week. Stopped taking ibuprofen d/t surgery. Now having pain all over since stop taking the ibuprofen. Febrile 102.3 en route. 166/94, 104hr, rr20, 96%RA

## 2020-08-17 NOTE — ED Notes (Signed)
Obtained blood work VIA syringe and 21G needle however blood clotted before I could transfer into tubes. Will need ultrasound IV

## 2020-08-18 ENCOUNTER — Observation Stay (HOSPITAL_COMMUNITY): Payer: Medicare HMO

## 2020-08-18 ENCOUNTER — Inpatient Hospital Stay (HOSPITAL_COMMUNITY)
Admission: EM | Admit: 2020-08-18 | Discharge: 2020-08-30 | DRG: 518 | Disposition: A | Discharge status: Skilled Nursing Facility | Payer: Medicare HMO | Attending: Internal Medicine | Admitting: Internal Medicine

## 2020-08-18 ENCOUNTER — Emergency Department (HOSPITAL_COMMUNITY): Payer: Medicare HMO

## 2020-08-18 DIAGNOSIS — E782 Mixed hyperlipidemia: Secondary | ICD-10-CM | POA: Diagnosis not present

## 2020-08-18 DIAGNOSIS — H538 Other visual disturbances: Secondary | ICD-10-CM | POA: Diagnosis not present

## 2020-08-18 DIAGNOSIS — R531 Weakness: Secondary | ICD-10-CM | POA: Diagnosis not present

## 2020-08-18 DIAGNOSIS — I214 Non-ST elevation (NSTEMI) myocardial infarction: Secondary | ICD-10-CM | POA: Diagnosis not present

## 2020-08-18 DIAGNOSIS — M48061 Spinal stenosis, lumbar region without neurogenic claudication: Secondary | ICD-10-CM | POA: Diagnosis not present

## 2020-08-18 DIAGNOSIS — I2109 ST elevation (STEMI) myocardial infarction involving other coronary artery of anterior wall: Secondary | ICD-10-CM | POA: Diagnosis not present

## 2020-08-18 DIAGNOSIS — I5021 Acute systolic (congestive) heart failure: Secondary | ICD-10-CM

## 2020-08-18 DIAGNOSIS — M1711 Unilateral primary osteoarthritis, right knee: Secondary | ICD-10-CM | POA: Diagnosis not present

## 2020-08-18 DIAGNOSIS — M1712 Unilateral primary osteoarthritis, left knee: Secondary | ICD-10-CM | POA: Diagnosis not present

## 2020-08-18 DIAGNOSIS — M961 Postlaminectomy syndrome, not elsewhere classified: Secondary | ICD-10-CM | POA: Diagnosis not present

## 2020-08-18 DIAGNOSIS — I4891 Unspecified atrial fibrillation: Secondary | ICD-10-CM | POA: Diagnosis not present

## 2020-08-18 DIAGNOSIS — E871 Hypo-osmolality and hyponatremia: Secondary | ICD-10-CM | POA: Diagnosis not present

## 2020-08-18 DIAGNOSIS — R52 Pain, unspecified: Secondary | ICD-10-CM

## 2020-08-18 DIAGNOSIS — G8929 Other chronic pain: Secondary | ICD-10-CM | POA: Diagnosis not present

## 2020-08-18 DIAGNOSIS — R278 Other lack of coordination: Secondary | ICD-10-CM | POA: Diagnosis not present

## 2020-08-18 DIAGNOSIS — M5441 Lumbago with sciatica, right side: Secondary | ICD-10-CM | POA: Diagnosis not present

## 2020-08-18 DIAGNOSIS — N1832 Chronic kidney disease, stage 3b: Secondary | ICD-10-CM | POA: Diagnosis present

## 2020-08-18 DIAGNOSIS — R778 Other specified abnormalities of plasma proteins: Secondary | ICD-10-CM | POA: Diagnosis not present

## 2020-08-18 DIAGNOSIS — M5416 Radiculopathy, lumbar region: Secondary | ICD-10-CM | POA: Diagnosis not present

## 2020-08-18 DIAGNOSIS — R339 Retention of urine, unspecified: Secondary | ICD-10-CM | POA: Diagnosis not present

## 2020-08-18 DIAGNOSIS — E1122 Type 2 diabetes mellitus with diabetic chronic kidney disease: Secondary | ICD-10-CM | POA: Diagnosis present

## 2020-08-18 DIAGNOSIS — M25462 Effusion, left knee: Secondary | ICD-10-CM | POA: Diagnosis not present

## 2020-08-18 DIAGNOSIS — R6510 Systemic inflammatory response syndrome (SIRS) of non-infectious origin without acute organ dysfunction: Secondary | ICD-10-CM | POA: Diagnosis not present

## 2020-08-18 DIAGNOSIS — J811 Chronic pulmonary edema: Secondary | ICD-10-CM | POA: Diagnosis not present

## 2020-08-18 DIAGNOSIS — Z419 Encounter for procedure for purposes other than remedying health state, unspecified: Secondary | ICD-10-CM

## 2020-08-18 DIAGNOSIS — M25461 Effusion, right knee: Secondary | ICD-10-CM | POA: Diagnosis not present

## 2020-08-18 DIAGNOSIS — U071 COVID-19: Secondary | ICD-10-CM | POA: Diagnosis not present

## 2020-08-18 DIAGNOSIS — G459 Transient cerebral ischemic attack, unspecified: Secondary | ICD-10-CM | POA: Diagnosis not present

## 2020-08-18 DIAGNOSIS — J810 Acute pulmonary edema: Secondary | ICD-10-CM | POA: Diagnosis not present

## 2020-08-18 DIAGNOSIS — I10 Essential (primary) hypertension: Secondary | ICD-10-CM | POA: Diagnosis not present

## 2020-08-18 DIAGNOSIS — I130 Hypertensive heart and chronic kidney disease with heart failure and stage 1 through stage 4 chronic kidney disease, or unspecified chronic kidney disease: Secondary | ICD-10-CM

## 2020-08-18 DIAGNOSIS — I6340 Cerebral infarction due to embolism of unspecified cerebral artery: Secondary | ICD-10-CM | POA: Diagnosis not present

## 2020-08-18 DIAGNOSIS — E7800 Pure hypercholesterolemia, unspecified: Secondary | ICD-10-CM | POA: Diagnosis not present

## 2020-08-18 DIAGNOSIS — R918 Other nonspecific abnormal finding of lung field: Secondary | ICD-10-CM | POA: Diagnosis not present

## 2020-08-18 DIAGNOSIS — Z833 Family history of diabetes mellitus: Secondary | ICD-10-CM | POA: Diagnosis not present

## 2020-08-18 DIAGNOSIS — N179 Acute kidney failure, unspecified: Secondary | ICD-10-CM

## 2020-08-18 DIAGNOSIS — I429 Cardiomyopathy, unspecified: Secondary | ICD-10-CM | POA: Diagnosis not present

## 2020-08-18 DIAGNOSIS — I639 Cerebral infarction, unspecified: Secondary | ICD-10-CM | POA: Diagnosis not present

## 2020-08-18 DIAGNOSIS — Z20822 Contact with and (suspected) exposure to covid-19: Secondary | ICD-10-CM | POA: Diagnosis not present

## 2020-08-18 DIAGNOSIS — I63441 Cerebral infarction due to embolism of right cerebellar artery: Secondary | ICD-10-CM | POA: Diagnosis not present

## 2020-08-18 DIAGNOSIS — R509 Fever, unspecified: Secondary | ICD-10-CM | POA: Diagnosis not present

## 2020-08-18 DIAGNOSIS — J90 Pleural effusion, not elsewhere classified: Secondary | ICD-10-CM | POA: Diagnosis not present

## 2020-08-18 DIAGNOSIS — M5442 Lumbago with sciatica, left side: Secondary | ICD-10-CM

## 2020-08-18 DIAGNOSIS — I480 Paroxysmal atrial fibrillation: Secondary | ICD-10-CM | POA: Diagnosis not present

## 2020-08-18 DIAGNOSIS — R29898 Other symptoms and signs involving the musculoskeletal system: Secondary | ICD-10-CM

## 2020-08-18 DIAGNOSIS — R0902 Hypoxemia: Secondary | ICD-10-CM

## 2020-08-18 DIAGNOSIS — J9601 Acute respiratory failure with hypoxia: Secondary | ICD-10-CM | POA: Diagnosis not present

## 2020-08-18 DIAGNOSIS — Z981 Arthrodesis status: Secondary | ICD-10-CM | POA: Diagnosis not present

## 2020-08-18 DIAGNOSIS — I255 Ischemic cardiomyopathy: Secondary | ICD-10-CM | POA: Diagnosis not present

## 2020-08-18 DIAGNOSIS — I2510 Atherosclerotic heart disease of native coronary artery without angina pectoris: Secondary | ICD-10-CM

## 2020-08-18 DIAGNOSIS — M5116 Intervertebral disc disorders with radiculopathy, lumbar region: Secondary | ICD-10-CM | POA: Diagnosis not present

## 2020-08-18 DIAGNOSIS — R0602 Shortness of breath: Secondary | ICD-10-CM

## 2020-08-18 DIAGNOSIS — G464 Cerebellar stroke syndrome: Secondary | ICD-10-CM | POA: Diagnosis not present

## 2020-08-18 DIAGNOSIS — K429 Umbilical hernia without obstruction or gangrene: Secondary | ICD-10-CM | POA: Diagnosis not present

## 2020-08-18 DIAGNOSIS — Z48811 Encounter for surgical aftercare following surgery on the nervous system: Secondary | ICD-10-CM | POA: Diagnosis not present

## 2020-08-18 DIAGNOSIS — M2550 Pain in unspecified joint: Secondary | ICD-10-CM | POA: Diagnosis not present

## 2020-08-18 DIAGNOSIS — R42 Dizziness and giddiness: Secondary | ICD-10-CM | POA: Diagnosis not present

## 2020-08-18 DIAGNOSIS — Z7401 Bed confinement status: Secondary | ICD-10-CM | POA: Diagnosis not present

## 2020-08-18 DIAGNOSIS — I63449 Cerebral infarction due to embolism of unspecified cerebellar artery: Secondary | ICD-10-CM | POA: Diagnosis not present

## 2020-08-18 DIAGNOSIS — R29703 NIHSS score 3: Secondary | ICD-10-CM | POA: Diagnosis not present

## 2020-08-18 DIAGNOSIS — R1032 Left lower quadrant pain: Secondary | ICD-10-CM | POA: Diagnosis not present

## 2020-08-18 DIAGNOSIS — A419 Sepsis, unspecified organism: Secondary | ICD-10-CM | POA: Diagnosis not present

## 2020-08-18 DIAGNOSIS — R1031 Right lower quadrant pain: Secondary | ICD-10-CM | POA: Diagnosis not present

## 2020-08-18 LAB — CBC WITH DIFFERENTIAL/PLATELET
Abs Immature Granulocytes: 0.02 10*3/uL (ref 0.00–0.07)
Basophils Absolute: 0.1 10*3/uL (ref 0.0–0.1)
Basophils Relative: 1 %
Eosinophils Absolute: 0.1 10*3/uL (ref 0.0–0.5)
Eosinophils Relative: 1 %
HCT: 36.9 % (ref 36.0–46.0)
Hemoglobin: 11.7 g/dL — ABNORMAL LOW (ref 12.0–15.0)
Immature Granulocytes: 0 %
Lymphocytes Relative: 22 %
Lymphs Abs: 2.4 10*3/uL (ref 0.7–4.0)
MCH: 29.5 pg (ref 26.0–34.0)
MCHC: 31.7 g/dL (ref 30.0–36.0)
MCV: 93.2 fL (ref 80.0–100.0)
Monocytes Absolute: 1.3 10*3/uL — ABNORMAL HIGH (ref 0.1–1.0)
Monocytes Relative: 12 %
Neutro Abs: 7 10*3/uL (ref 1.7–7.7)
Neutrophils Relative %: 64 %
Platelets: 291 10*3/uL (ref 150–400)
RBC: 3.96 MIL/uL (ref 3.87–5.11)
RDW: 13.2 % (ref 11.5–15.5)
WBC: 10.8 10*3/uL — ABNORMAL HIGH (ref 4.0–10.5)
nRBC: 0 % (ref 0.0–0.2)

## 2020-08-18 LAB — COMPREHENSIVE METABOLIC PANEL
ALT: 16 U/L (ref 0–44)
AST: 19 U/L (ref 15–41)
Albumin: 3.9 g/dL (ref 3.5–5.0)
Alkaline Phosphatase: 79 U/L (ref 38–126)
Anion gap: 11 (ref 5–15)
BUN: 26 mg/dL — ABNORMAL HIGH (ref 8–23)
CO2: 25 mmol/L (ref 22–32)
Calcium: 9.6 mg/dL (ref 8.9–10.3)
Chloride: 101 mmol/L (ref 98–111)
Creatinine, Ser: 0.99 mg/dL (ref 0.44–1.00)
GFR calc Af Amer: >60 mL/min (ref >60)
GFR calc non Af Amer: 54 mL/min — ABNORMAL LOW (ref >60)
Glucose, Bld: 138 mg/dL — ABNORMAL HIGH (ref 70–99)
Potassium: 4 mmol/L (ref 3.5–5.1)
Sodium: 137 mmol/L (ref 135–145)
Total Bilirubin: 0.4 mg/dL (ref 0.3–1.2)
Total Protein: 8 g/dL (ref 6.5–8.1)

## 2020-08-18 LAB — URINALYSIS, ROUTINE W REFLEX MICROSCOPIC
Bilirubin Urine: NEGATIVE
Glucose, UA: NEGATIVE mg/dL
Hgb urine dipstick: NEGATIVE
Ketones, ur: NEGATIVE mg/dL
Leukocytes,Ua: NEGATIVE
Nitrite: NEGATIVE
Protein, ur: NEGATIVE mg/dL
Specific Gravity, Urine: 1.011 (ref 1.005–1.030)
pH: 6 (ref 5.0–8.0)

## 2020-08-18 LAB — SARS CORONAVIRUS 2 BY RT PCR (HOSPITAL ORDER, PERFORMED IN ~~LOC~~ HOSPITAL LAB): SARS Coronavirus 2: NEGATIVE

## 2020-08-18 LAB — C-REACTIVE PROTEIN: CRP: 11.5 mg/dL — ABNORMAL HIGH (ref <1.0)

## 2020-08-18 LAB — LACTIC ACID, PLASMA: Lactic Acid, Venous: 1 mmol/L (ref 0.5–1.9)

## 2020-08-18 LAB — PROTIME-INR
INR: 1.1 (ref 0.8–1.2)
Prothrombin Time: 13.5 seconds (ref 11.4–15.2)

## 2020-08-18 LAB — CK: Total CK: 54 U/L (ref 38–234)

## 2020-08-18 LAB — APTT: aPTT: 32 seconds (ref 24–36)

## 2020-08-18 MED ORDER — VITAMIN D 25 MCG (1000 UNIT) PO TABS
3000.0000 [IU] | ORAL_TABLET | Freq: Every day | ORAL | Status: DC
  Administered 2020-08-18 – 2020-08-26 (×8): 3000 [IU] via ORAL
  Filled 2020-08-18 (×8): qty 3

## 2020-08-18 MED ORDER — ONDANSETRON HCL 4 MG/2ML IJ SOLN
4.0000 mg | Freq: Four times a day (QID) | INTRAMUSCULAR | Status: DC | PRN
  Administered 2020-08-26 – 2020-08-27 (×2): 4 mg via INTRAVENOUS
  Filled 2020-08-18 (×2): qty 2

## 2020-08-18 MED ORDER — OXYCODONE-ACETAMINOPHEN 5-325 MG PO TABS
1.0000 | ORAL_TABLET | Freq: Two times a day (BID) | ORAL | Status: DC | PRN
  Administered 2020-08-18: 1 via ORAL
  Filled 2020-08-18: qty 1

## 2020-08-18 MED ORDER — LOSARTAN POTASSIUM 25 MG PO TABS
25.0000 mg | ORAL_TABLET | Freq: Every day | ORAL | Status: DC
  Administered 2020-08-18 – 2020-08-22 (×4): 25 mg via ORAL
  Filled 2020-08-18 (×4): qty 1

## 2020-08-18 MED ORDER — LACTATED RINGERS IV BOLUS (SEPSIS)
500.0000 mL | Freq: Once | INTRAVENOUS | Status: AC
  Administered 2020-08-18: 500 mL via INTRAVENOUS

## 2020-08-18 MED ORDER — HYDROMORPHONE HCL 1 MG/ML IJ SOLN
0.5000 mg | INTRAMUSCULAR | Status: DC | PRN
  Administered 2020-08-18 – 2020-08-28 (×10): 0.5 mg via INTRAVENOUS
  Filled 2020-08-18 (×10): qty 1

## 2020-08-18 MED ORDER — ACETAMINOPHEN 325 MG PO TABS
650.0000 mg | ORAL_TABLET | Freq: Four times a day (QID) | ORAL | Status: DC | PRN
  Administered 2020-08-29: 650 mg via ORAL
  Filled 2020-08-18: qty 2

## 2020-08-18 MED ORDER — VANCOMYCIN HCL 1750 MG/350ML IV SOLN
1750.0000 mg | Freq: Once | INTRAVENOUS | Status: AC
  Administered 2020-08-18: 1750 mg via INTRAVENOUS
  Filled 2020-08-18: qty 350

## 2020-08-18 MED ORDER — GADOBUTROL 1 MMOL/ML IV SOLN
8.0000 mL | Freq: Once | INTRAVENOUS | Status: AC | PRN
  Administered 2020-08-18: 8 mL via INTRAVENOUS

## 2020-08-18 MED ORDER — SODIUM CHLORIDE 0.9 % IV SOLN
2.0000 g | Freq: Once | INTRAVENOUS | Status: AC
  Administered 2020-08-18: 2 g via INTRAVENOUS
  Filled 2020-08-18: qty 2

## 2020-08-18 MED ORDER — ROSUVASTATIN CALCIUM 20 MG PO TABS
20.0000 mg | ORAL_TABLET | Freq: Every day | ORAL | Status: DC
  Administered 2020-08-19 – 2020-08-21 (×3): 20 mg via ORAL
  Filled 2020-08-18 (×4): qty 1

## 2020-08-18 MED ORDER — LORAZEPAM 2 MG/ML IJ SOLN
0.2500 mg | Freq: Once | INTRAMUSCULAR | Status: AC
  Administered 2020-08-18: 0.25 mg via INTRAVENOUS
  Filled 2020-08-18: qty 1

## 2020-08-18 MED ORDER — POLYETHYLENE GLYCOL 3350 17 G PO PACK: 17.0000 g | PACK | Freq: Every day | ORAL | Status: DC | PRN

## 2020-08-18 MED ORDER — VANCOMYCIN HCL IN DEXTROSE 1-5 GM/200ML-% IV SOLN: 1000.0000 mg | Freq: Once | INTRAVENOUS | Status: DC

## 2020-08-18 MED ORDER — ACETAMINOPHEN 650 MG RE SUPP: 650.0000 mg | Freq: Four times a day (QID) | RECTAL | Status: DC | PRN

## 2020-08-18 MED ORDER — ONDANSETRON HCL 4 MG PO TABS: 4.0000 mg | ORAL_TABLET | Freq: Four times a day (QID) | ORAL | Status: DC | PRN

## 2020-08-18 MED ORDER — ONDANSETRON HCL 4 MG/2ML IJ SOLN
4.0000 mg | Freq: Once | INTRAMUSCULAR | Status: AC
  Administered 2020-08-18: 4 mg via INTRAVENOUS
  Filled 2020-08-18: qty 2

## 2020-08-18 MED ORDER — SODIUM CHLORIDE 0.45 % IV SOLN: INTRAVENOUS | Status: DC

## 2020-08-18 MED ORDER — LACTATED RINGERS IV SOLN: INTRAVENOUS | Status: AC

## 2020-08-18 MED ORDER — CARVEDILOL 12.5 MG PO TABS
12.5000 mg | ORAL_TABLET | Freq: Two times a day (BID) | ORAL | Status: DC
  Administered 2020-08-18 – 2020-08-22 (×8): 12.5 mg via ORAL
  Filled 2020-08-18 (×8): qty 1

## 2020-08-18 MED ORDER — METRONIDAZOLE IN NACL 5-0.79 MG/ML-% IV SOLN
500.0000 mg | Freq: Once | INTRAVENOUS | Status: AC
  Administered 2020-08-18: 500 mg via INTRAVENOUS
  Filled 2020-08-18: qty 100

## 2020-08-18 NOTE — ED Notes (Signed)
Daughter in room 

## 2020-08-18 NOTE — ED Notes (Signed)
Pt 02 saturation decreased to 87%. 2L [place] applied temporarily.

## 2020-08-18 NOTE — Progress Notes (Signed)
Pharmacy Note   A consult was received from an ED physician for vancomycin and cefepime per pharmacy dosing.    The patient's profile has been reviewed for ht/wt/allergies/indication/available labs.    A one time order has been placed for vancomycin 1750 mg IV x1 and cefepime 2 gr IV x1 .    Further antibiotics/pharmacy consults should be ordered by admitting physician if indicated.                       Thank you,  Royetta Asal, PharmD, BCPS 08/18/2020 5:07 AM

## 2020-08-18 NOTE — H&P (Signed)
History and Physical    Kristin [name] JJO:[id number] DOB: Nov 12, 1941 DOA: 08/18/2020  PCP: Caren Macadam, MD   Patient coming from: Home  I have personally briefly reviewed patient's old medical records in [place]  Chief Complaint: Bilateral lower extremity weakness  HPI: Kristin [name] is a 79 y.o. female with medical history significant for chronic low back pain, hypertension, chronic kidney disease and diabetes mellitus who presents to the emergency room via EMS for evaluation of bilateral lower extremity weakness and worsening pain in her lower back.  Patient has a history of chronic low back pain for which she follows up with neurosurgery.  She had an MRI of the spine on 04/23/20 which showed advanced lumbar facet arthrosis with multilevel degenerative anterolisthesis. Severe spinal stenosis at L2-3 and L3-4. Mild-to-moderate spinal stenosis at L4-5. Moderate to severe multilevel neural foraminal stenosis. Patient states that over the last 3 days she has been unable to ambulate due to worsening low back pain as well as lower extremity weakness.  She has been unable to ambulate even with an assistive device but denies having any urinary or fecal incontinence.  She denies having any saddle anesthesia. She denies any recent trauma. Patient states that she had been taking ibuprofen for pain control but had to stop because of planned back surgery in the next 1 week. Per EMS she had a fever with a T-max of 102.65F, she was tachycardic with heart rate of 104, respiratory rate of 20, blood pressure 166/94 pulse oximetry of 96%. She denies having a cough, no chest pain, no urinary frequency, dysuria or nocturia, no abdominal pain or any changes in her bowel habits, no shortness of breath. She denies having any COVID-19 exposure Labs show sodium of 137, potassium of 4, chloride 101, bicarb 25, BUN 26, creatinine 0.9, AST 19, ALT 16, lactic acid 1.0, white count 10.8, hemoglobin 11.7,  hematocrit 36.9, MCV 93.2, RDW 13.2, platelet count 291 Urinalysis is sterile Chest x-ray reviewed by me shows no infiltrate or effusion.    ED Course: Patient is an 79 year old female who presents to the ER for evaluation of worsening low back pain as well as bilateral lower extremity weakness.  She was febrile with a T-max of 102.65F, tachycardic and has a white cell count of 10,000 with normal differential.  Patient does not have any obvious source of sepsis at this time but received broad-spectrum empiric antibiotic therapy in the ER. MRI of the lumbar spine has been ordered, awaiting results. Patient will be admitted to the hospital for further evaluation  Review of Systems: As per HPI otherwise 10 point review of systems negative.    Past Medical History:  Diagnosis Date  . Diabetes mellitus without complication ([place])   . Hypercholesteremia   . Hypertension   . Renal disorder    CKD Stage IV    History reviewed. No pertinent surgical history.   reports that she has never smoked. She has never used smokeless tobacco. She reports that she does not drink alcohol and does not use drugs.  No Known Allergies  Family History  Problem Relation Age of Onset  . Diabetes Father      Prior to Admission medications   Medication Sig Start Date End Date Taking? Authorizing Provider  amLODipine (NORVASC) 10 MG tablet Take 1 tablet (10 mg total) by mouth daily. 03/16/20  Yes Nicolette Bang, DO  carvedilol (COREG) 12.5 MG tablet Take 1 tablet (12.5 mg total) by mouth 2 (two) times daily  with a meal. 03/16/20  Yes Nicolette Bang, DO  cholecalciferol (VITAMIN D) 25 MCG (1000 UNIT) tablet Take 3,000 Units by mouth daily.   Yes [provider]  hydrALAZINE (APRESOLINE) 25 MG tablet Take 25 mg by mouth in the morning and at bedtime.   Yes [provider]  losartan (COZAAR) 25 MG tablet Take 25 mg by mouth daily. 08/10/20  Yes [provider]    oxyCODONE-acetaminophen (PERCOCET/ROXICET) 5-325 MG tablet Take 1 tablet by mouth 2 (two) times daily as needed for severe pain.   Yes [provider]  rosuvastatin (CRESTOR) 20 MG tablet Take 1 tablet (20 mg total) by mouth at bedtime. 03/16/20  Yes Nicolette Bang, DO  cloNIDine (CATAPRES) 0.1 MG tablet Take 1 tablet (0.1 mg total) by mouth 2 (two) times daily. Patient not taking: Reported on 08/18/2020 03/16/20   Nicolette Bang, DO  HYDROcodone-acetaminophen (NORCO/VICODIN) 5-325 MG tablet Take 1-2 tablets by mouth every 6 (six) hours as needed. Patient not taking: Reported on 08/18/2020 03/31/20   Veryl Speak, MD  Misc. Devices MISC Please dispense one power stair lift for at home daily use prn debility. 03/15/20   Nicolette Bang, DO  predniSONE (DELTASONE) 10 MG tablet Take 2 tablets (20 mg total) by mouth 2 (two) times daily with a meal. Patient not taking: Reported on 08/18/2020 03/31/20   Veryl Speak, MD  traZODone (DESYREL) 50 MG tablet Take 0.5-1 tablets (25-50 mg total) by mouth at bedtime as needed for sleep. Patient not taking: Reported on 03/31/2020 03/09/20   Nicolette Bang, DO    Physical Exam: Vitals:   08/18/20 0530 08/18/20 0551 08/18/20 0600 08/18/20 0700  BP: (!) 164/96  (!) 152/87 (!) 156/92  Pulse: 97  (!) 103 88  Resp: 17  16 17   Temp:  99.2 F (37.3 C)    TempSrc:  Oral    SpO2: 96%  (!) 88% 97%     Vitals:   08/18/20 0530 08/18/20 0551 08/18/20 0600 08/18/20 0700  BP: (!) 164/96  (!) 152/87 (!) 156/92  Pulse: 97  (!) 103 88  Resp: 17  16 17   Temp:  99.2 F (37.3 C)    TempSrc:  Oral    SpO2: 96%  (!) 88% 97%    Constitutional: NAD, alert and oriented x 3 Eyes: PERRL, lids and conjunctivae normal ENMT: Mucous membranes are moist.  Neck: normal, supple, no masses, no thyromegaly Respiratory: clear to auscultation bilaterally, no wheezing, no crackles. Normal respiratory effort. No accessory muscle use.   Cardiovascular: Tachycardic, no murmurs / rubs / gallops. No extremity edema. 2+ pedal pulses. No carotid bruits.  Abdomen: Suprapubic tenderness, no masses palpated. No hepatosplenomegaly. Bowel sounds positive.  Musculoskeletal: no clubbing / cyanosis. No joint deformity upper and lower extremities.  Skin: no rashes, lesions, ulcers.  Neurologic: Unable to lift lower extremities off the bed Psychiatric: Normal mood and affect.   Labs on Admission: I have personally reviewed following labs and imaging studies  CBC: Recent Labs  Lab 08/18/20 0200  WBC 10.8*  NEUTROABS 7.0  HGB 11.7*  HCT 36.9  MCV 93.2  PLT 163   Basic Metabolic Panel: Recent Labs  Lab 08/18/20 0200  NA 137  K 4.0  CL 101  CO2 25  GLUCOSE 138*  BUN 26*  CREATININE 0.99  CALCIUM 9.6   GFR: CrCl cannot be calculated (Unknown ideal weight.). Liver Function Tests: Recent Labs  Lab 08/18/20 0200  AST 19  ALT 16  ALKPHOS 79  BILITOT 0.4  PROT 8.0  ALBUMIN 3.9   No results for input(s): LIPASE, AMYLASE in the last 168 hours. No results for input(s): AMMONIA in the last 168 hours. Coagulation Profile: No results for input(s): INR, PROTIME in the last 168 hours. Cardiac Enzymes: No results for input(s): CKTOTAL, CKMB, CKMBINDEX, TROPONINI in the last 168 hours. BNP (last 3 results) No results for input(s): PROBNP in the last 8760 hours. HbA1C: No results for input(s): HGBA1C in the last 72 hours. CBG: No results for input(s): GLUCAP in the last 168 hours. Lipid Profile: No results for input(s): CHOL, HDL, LDLCALC, TRIG, CHOLHDL, LDLDIRECT in the last 72 hours. Thyroid Function Tests: No results for input(s): TSH, T4TOTAL, FREET4, T50FREE, THYROIDAB in the last 72 hours. Anemia Panel: No results for input(s): VITAMINB12, FOLATE, FERRITIN, TIBC, IRON, RETICCTPCT in the last 72 hours. Urine analysis:    Component Value Date/Time   COLORURINE STRAW (A) 08/18/2020 0407   APPEARANCEUR CLEAR  08/18/2020 0407   LABSPEC 1.011 08/18/2020 0407   PHURINE 6.0 08/18/2020 0407   GLUCOSEU NEGATIVE 08/18/2020 0407   HGBUR NEGATIVE 08/18/2020 0407   BILIRUBINUR NEGATIVE 08/18/2020 0407   KETONESUR NEGATIVE 08/18/2020 0407   PROTEINUR NEGATIVE 08/18/2020 0407   NITRITE NEGATIVE 08/18/2020 0407   LEUKOCYTESUR NEGATIVE 08/18/2020 0407    Radiological Exams on Admission: DG Chest Port 1 View  Result Date: 08/18/2020 CLINICAL DATA:  Fever and body aches for 24 hours EXAM: PORTABLE CHEST 1 VIEW COMPARISON:  None. FINDINGS: Shallow inspiration. Heart size and pulmonary vascularity are normal. Lungs are clear. No pleural effusions. No pneumothorax. Mediastinal contours appear intact. Degenerative changes in the spine and shoulders. Calcification of the aorta. IMPRESSION: No active disease. Electronically Signed   By: Lucienne Capers M.D.   On: 08/18/2020 00:45    EKG: Independently reviewed.   Assessment/Plan Principal Problem:   Lower extremity weakness Active Problems:   SIRS (systemic inflammatory response syndrome) ([facility])   Hypertension   Chronic low back pain with bilateral sciatica    Lower extremity weakness Patient has history of chronic low back pain secondary to moderate to severe spinal stenosis. She is usually able to ambulate without assistance and occasionally with assist devices but in the last 3 days has been unable to ambulate due to worsening low back pain. She also has suprapubic tenderness on exam consistent with urinary retention MRI of the lumbar spine shows strain of the left psoas that is new since 04/23/20. Distended urinary bladder, please correlate for retention.Advanced facet osteoarthritis at L2-3 and below. Posterior periarticular inflammation is seen at L1-2 to L3-4. Advanced spinal stenosis at L2-3 and L3-4. Multilevel moderate foraminal impingement Discussed patient with Dr Reatha Armour, neurosurgeon who recommends transfer to [facility] for further  evaluation    SIRS (POA) With no obvious source of infection at this time Patient had a fever with a T-max of 102.50F, she was tachycardic and has a white cell count of 10.8 with a normal differential. COVID 19 PCR test is negative Blood cultures have been sent Patient received empiric broad-spectrum antibiotic therapy    Hypertension Continue amlodipine, Cozaar and carvedilol     DVT prophylaxis: SCD Code Status: Full code Family Communication: Greater than 50% of time was spent discussing plan of care with patient at the bedside.  She verbalizes understanding and agrees with the plan. Disposition Plan: Back to previous home environment Consults called: Neurosurgery    Gorge Almanza MD [facility]  Procedures  08/18/2020, 8:05 AM

## 2020-08-18 NOTE — ED Notes (Signed)
Pt brief changed.  Report given to [facility].

## 2020-08-18 NOTE — ED Notes (Signed)
Breakfast tray given. °

## 2020-08-18 NOTE — ED Provider Notes (Signed)
[facility] DEPT Provider Note   CSN: [id number] Arrival date & time: 08/17/20  2320     History No chief complaint on file.   Kristin [name] is a 79 y.o. female.  Patient presents to the emergency department for evaluation of generalized aches.  Patient reports that the pain is everywhere but she has noticed some increased pain in bilateral groin when she moves her legs.  She has been previously on ibuprofen for chronic back pain but is pending surgery and therefore has recently stopped the ibuprofen.  She denies urinary frequency, dysuria, abdominal pain, vomiting, diarrhea, chest pain, cough, shortness of breath.        Past Medical History:  Diagnosis Date  . Diabetes mellitus without complication ([place])   . Hypercholesteremia   . Hypertension   . Renal disorder    CKD Stage IV    There are no problems to display for this patient.   History reviewed. No pertinent surgical history.   OB History   No obstetric history on file.     Family History  Problem Relation Age of Onset  . Diabetes Father     Social History   Tobacco Use  . Smoking status: Never Smoker  . Smokeless tobacco: Never Used  Vaping Use  . Vaping Use: Never used  Substance Use Topics  . Alcohol use: Never  . Drug use: Never    Home Medications Prior to Admission medications   Medication Sig Start Date End Date Taking? Authorizing Provider  amLODipine (NORVASC) 10 MG tablet Take 1 tablet (10 mg total) by mouth daily. 03/16/20  Yes Nicolette Bang, DO  carvedilol (COREG) 12.5 MG tablet Take 1 tablet (12.5 mg total) by mouth 2 (two) times daily with a meal. 03/16/20  Yes Nicolette Bang, DO  cholecalciferol (VITAMIN D) 25 MCG (1000 UNIT) tablet Take 3,000 Units by mouth daily.   Yes [provider]  hydrALAZINE (APRESOLINE) 25 MG tablet Take 25 mg by mouth in the morning and at bedtime.   Yes [provider]  losartan  (COZAAR) 25 MG tablet Take 25 mg by mouth daily. 08/10/20  Yes [provider]  oxyCODONE-acetaminophen (PERCOCET/ROXICET) 5-325 MG tablet Take 1 tablet by mouth 2 (two) times daily as needed for severe pain.   Yes [provider]  rosuvastatin (CRESTOR) 20 MG tablet Take 1 tablet (20 mg total) by mouth at bedtime. 03/16/20  Yes Nicolette Bang, DO  cloNIDine (CATAPRES) 0.1 MG tablet Take 1 tablet (0.1 mg total) by mouth 2 (two) times daily. Patient not taking: Reported on 08/18/2020 03/16/20   Nicolette Bang, DO  HYDROcodone-acetaminophen (NORCO/VICODIN) 5-325 MG tablet Take 1-2 tablets by mouth every 6 (six) hours as needed. Patient not taking: Reported on 08/18/2020 03/31/20   Veryl Speak, MD  Misc. Devices MISC Please dispense one power stair lift for at home daily use prn debility. 03/15/20   Nicolette Bang, DO  predniSONE (DELTASONE) 10 MG tablet Take 2 tablets (20 mg total) by mouth 2 (two) times daily with a meal. Patient not taking: Reported on 08/18/2020 03/31/20   Veryl Speak, MD  traZODone (DESYREL) 50 MG tablet Take 0.5-1 tablets (25-50 mg total) by mouth at bedtime as needed for sleep. Patient not taking: Reported on 03/31/2020 03/09/20   Nicolette Bang, DO    Allergies    Patient has no known allergies.  Review of Systems   Review of Systems  Musculoskeletal: Positive for myalgias.  All other systems reviewed and are negative.   Physical Exam Updated Vital Signs BP (!) 157/95   Pulse (!) 109   Temp 100.2 F (37.9 C) (Oral)   Resp (!) 24   SpO2 98%   Physical Exam Vitals and nursing note reviewed.  Constitutional:      General: She is not in acute distress.    Appearance: Normal appearance. She is well-developed.  HENT:     Head: Normocephalic and atraumatic.     Right Ear: Hearing normal.     Left Ear: Hearing normal.     Nose: Nose normal.  Eyes:     Conjunctiva/sclera: Conjunctivae normal.     Pupils: Pupils  are equal, round, and reactive to light.  Cardiovascular:     Rate and Rhythm: Regular rhythm. Tachycardia present.     Heart sounds: S1 normal and S2 normal. No murmur heard.  No friction rub. No gallop.   Pulmonary:     Effort: Pulmonary effort is normal. No respiratory distress.     Breath sounds: Normal breath sounds.  Chest:     Chest wall: No tenderness.  Abdominal:     General: Bowel sounds are normal.     Palpations: Abdomen is soft.     Tenderness: There is no abdominal tenderness. There is no guarding or rebound. Negative signs include Murphy's sign and McBurney's sign.     Hernia: No hernia is present.  Musculoskeletal:        General: Normal range of motion.     Cervical back: Normal range of motion and neck supple.  Skin:    General: Skin is warm and dry.     Findings: No rash.  Neurological:     Mental Status: She is alert and oriented to person, place, and time.     GCS: GCS eye subscore is 4. GCS verbal subscore is 5. GCS motor subscore is 6.     Cranial Nerves: No cranial nerve deficit.     Sensory: No sensory deficit.     Coordination: Coordination normal.  Psychiatric:        Speech: Speech normal.        Behavior: Behavior normal.        Thought Content: Thought content normal.     ED Results / Procedures / Treatments   Labs (all labs ordered are listed, but only abnormal results are displayed) Labs Reviewed  COMPREHENSIVE METABOLIC PANEL - Abnormal; Notable for the following components:      Result Value   Glucose, Bld 138 (*)    BUN 26 (*)    GFR calc non Af Amer 54 (*)    All other components within normal limits  CBC WITH DIFFERENTIAL/PLATELET - Abnormal; Notable for the following components:   WBC 10.8 (*)    Hemoglobin 11.7 (*)    Monocytes Absolute 1.3 (*)    All other components within normal limits  URINALYSIS, ROUTINE W REFLEX MICROSCOPIC - Abnormal; Notable for the following components:   Color, Urine STRAW (*)    All other components  within normal limits  SARS CORONAVIRUS 2 BY RT PCR (HOSPITAL ORDER, [facility])  URINE CULTURE  CULTURE, BLOOD (ROUTINE X 2)  CULTURE, BLOOD (ROUTINE X 2)  LACTIC ACID, PLASMA    EKG None  Radiology DG Chest Port 1 View  Result Date: 08/18/2020 CLINICAL DATA:  Fever and body aches for 24 hours EXAM: PORTABLE CHEST 1 VIEW COMPARISON:  None. FINDINGS: Shallow  inspiration. Heart size and pulmonary vascularity are normal. Lungs are clear. No pleural effusions. No pneumothorax. Mediastinal contours appear intact. Degenerative changes in the spine and shoulders. Calcification of the aorta. IMPRESSION: No active disease. Electronically Signed   By: Lucienne Capers M.D.   On: 08/18/2020 00:45    Procedures Procedures (including critical care time)  Medications Ordered in ED Medications  lactated ringers infusion (has no administration in time range)  lactated ringers bolus 500 mL (has no administration in time range)  ceFEPIme (MAXIPIME) 2 g in sodium chloride 0.9 % 100 mL IVPB (has no administration in time range)  metroNIDAZOLE (FLAGYL) IVPB 500 mg (has no administration in time range)  HYDROmorphone (DILAUDID) injection 0.5 mg (has no administration in time range)  ondansetron (ZOFRAN) injection 4 mg (has no administration in time range)  vancomycin (VANCOREADY) IVPB 1750 mg/350 mL (has no administration in time range)    ED Course  I have reviewed the triage vital signs and the nursing notes.  Pertinent labs & imaging results that were available during my care of the patient were reviewed by me and considered in my medical decision making (see chart for details).    MDM Rules/Calculators/A&P                          Patient presents to the emergency department for evaluation of fever and generalized weakness.  Patient has a known herniated disc in her low back, is scheduled for surgery.  She has had increased pain from the waist down.  She does not have  any appreciable neurologic deficit, however.  Patient found to be febrile prior to arrival.  She was mildly tachypneic and tachycardic.  She did not have any obvious infectious symptoms, however.  Work-up has not revealed source of the fever.  Urine is clear.  She has a very slight elevated white count at 10.8.  Chest x-ray does not show any evidence of infection.  She does not have Covid or cold symptoms.  Covid testing was negative.  Blood and urine cultures are pending.  She has had increased low back pain since she stopped taking ibuprofen because of her pending surgery.  Increased pain may be from decreased analgesia, but cannot rule out lumbar infection as a cause of her fever and increased pain.  Broad-spectrum antibiotics administered, will perform MRI lumbar spine with and without contrast, admit patient for further management.  Final Clinical Impression(s) / ED Diagnoses Final diagnoses:  Fever, unspecified fever cause    Rx / DC Orders ED Discharge Orders    None       Fritzie Prioleau, Gwenyth Allegra, MD 08/18/20 [phone]

## 2020-08-18 NOTE — Progress Notes (Signed)
Received report from [facility].  Room 5M18 is ready for patient.  Pending transport from [facility] ED.  Earleen Reaper RN

## 2020-08-18 NOTE — ED Notes (Signed)
Daughter called and notified pt was moving to [facility].

## 2020-08-18 NOTE — ED Notes (Signed)
Attempted to call report. No answer on floor. Will try again.

## 2020-08-18 NOTE — ED Notes (Signed)
IV team present in room

## 2020-08-18 NOTE — ED Notes (Signed)
Attempted to call report. RN unable to call report at this time.

## 2020-08-19 ENCOUNTER — Inpatient Hospital Stay (HOSPITAL_COMMUNITY): Payer: Medicare HMO

## 2020-08-19 ENCOUNTER — Encounter (HOSPITAL_COMMUNITY)
Admission: EM | Disposition: A | Discharge status: Skilled Nursing Facility | Payer: Self-pay | Source: Home / Self Care | Attending: Family Medicine

## 2020-08-19 ENCOUNTER — Encounter (HOSPITAL_COMMUNITY): Payer: Self-pay | Admitting: Internal Medicine

## 2020-08-19 ENCOUNTER — Inpatient Hospital Stay (HOSPITAL_COMMUNITY): Payer: Medicare HMO | Admitting: Anesthesiology

## 2020-08-19 DIAGNOSIS — M25461 Effusion, right knee: Secondary | ICD-10-CM

## 2020-08-19 DIAGNOSIS — R29898 Other symptoms and signs involving the musculoskeletal system: Secondary | ICD-10-CM

## 2020-08-19 HISTORY — PX: LUMBAR LAMINECTOMY/DECOMPRESSION MICRODISCECTOMY: SHX5026

## 2020-08-19 LAB — SYNOVIAL CELL COUNT + DIFF, W/ CRYSTALS
Eosinophils-Synovial: 0 % (ref 0–1)
Lymphocytes-Synovial Fld: 0 % (ref 0–20)
Monocyte-Macrophage-Synovial Fluid: 7 % — ABNORMAL LOW (ref 50–90)
Neutrophil, Synovial: 93 % — ABNORMAL HIGH (ref 0–25)
WBC, Synovial: 46000 /mm3 — ABNORMAL HIGH (ref 0–200)

## 2020-08-19 LAB — CREATININE, SERUM
Creatinine, Ser: 1.24 mg/dL — ABNORMAL HIGH (ref 0.44–1.00)
GFR calc Af Amer: 48 mL/min — ABNORMAL LOW (ref >60)
GFR calc non Af Amer: 41 mL/min — ABNORMAL LOW (ref >60)

## 2020-08-19 LAB — CBC
HCT: 32 % — ABNORMAL LOW (ref 36.0–46.0)
Hemoglobin: 10.4 g/dL — ABNORMAL LOW (ref 12.0–15.0)
MCH: 29.5 pg (ref 26.0–34.0)
MCHC: 32.5 g/dL (ref 30.0–36.0)
MCV: 90.9 fL (ref 80.0–100.0)
Platelets: 287 10*3/uL (ref 150–400)
RBC: 3.52 MIL/uL — ABNORMAL LOW (ref 3.87–5.11)
RDW: 13.1 % (ref 11.5–15.5)
WBC: 13.7 10*3/uL — ABNORMAL HIGH (ref 4.0–10.5)
nRBC: 0 % (ref 0.0–0.2)

## 2020-08-19 LAB — URINE CULTURE: Culture: <10000 — AB

## 2020-08-19 LAB — GLUCOSE, CAPILLARY
Glucose-Capillary: 130 mg/dL — ABNORMAL HIGH (ref 70–99)
Glucose-Capillary: 143 mg/dL — ABNORMAL HIGH (ref 70–99)
Glucose-Capillary: 156 mg/dL — ABNORMAL HIGH (ref 70–99)

## 2020-08-19 LAB — SURGICAL PCR SCREEN
MRSA, PCR: NEGATIVE
Staphylococcus aureus: NEGATIVE

## 2020-08-19 SURGERY — LUMBAR LAMINECTOMY/DECOMPRESSION MICRODISCECTOMY 3 LEVELS
Anesthesia: General | Site: Back

## 2020-08-19 MED ORDER — CEFAZOLIN SODIUM-DEXTROSE 2-4 GM/100ML-% IV SOLN
INTRAVENOUS | Status: AC
  Filled 2020-08-19: qty 100

## 2020-08-19 MED ORDER — FENTANYL CITRATE (PF) 250 MCG/5ML IJ SOLN
INTRAMUSCULAR | Status: AC
  Filled 2020-08-19: qty 5

## 2020-08-19 MED ORDER — DEXMEDETOMIDINE (PRECEDEX) IN NS 20 MCG/5ML (4 MCG/ML) IV SYRINGE
PREFILLED_SYRINGE | INTRAVENOUS | Status: DC | PRN
  Administered 2020-08-19 (×3): 4 ug via INTRAVENOUS

## 2020-08-19 MED ORDER — PROPOFOL 10 MG/ML IV BOLUS
INTRAVENOUS | Status: DC | PRN
  Administered 2020-08-19: 100 mg via INTRAVENOUS

## 2020-08-19 MED ORDER — CHLORHEXIDINE GLUCONATE CLOTH 2 % EX PADS
6.0000 | MEDICATED_PAD | Freq: Every day | CUTANEOUS | Status: DC
  Administered 2020-08-19 – 2020-08-22 (×4): 6 via TOPICAL

## 2020-08-19 MED ORDER — CHLORHEXIDINE GLUCONATE 0.12 % MT SOLN
OROMUCOSAL | Status: AC
  Administered 2020-08-19: 15 mL via OROMUCOSAL
  Filled 2020-08-19: qty 15

## 2020-08-19 MED ORDER — MORPHINE SULFATE (PF) 2 MG/ML IV SOLN
2.0000 mg | INTRAVENOUS | Status: DC | PRN
  Administered 2020-08-22 (×2): 2 mg via INTRAVENOUS
  Filled 2020-08-19 (×2): qty 1

## 2020-08-19 MED ORDER — METHOCARBAMOL 1000 MG/10ML IJ SOLN
500.0000 mg | Freq: Four times a day (QID) | INTRAVENOUS | Status: DC | PRN
  Filled 2020-08-19: qty 5

## 2020-08-19 MED ORDER — SODIUM CHLORIDE 0.9 % IV SOLN
2.0000 g | Freq: Two times a day (BID) | INTRAVENOUS | Status: AC
  Administered 2020-08-19 – 2020-08-24 (×11): 2 g via INTRAVENOUS
  Filled 2020-08-19 (×11): qty 2

## 2020-08-19 MED ORDER — HYDRALAZINE HCL 20 MG/ML IJ SOLN: 10.0000 mg | Freq: Four times a day (QID) | INTRAMUSCULAR | Status: DC | PRN

## 2020-08-19 MED ORDER — MENTHOL 3 MG MT LOZG: 1.0000 | LOZENGE | OROMUCOSAL | Status: DC | PRN

## 2020-08-19 MED ORDER — ACETAMINOPHEN 10 MG/ML IV SOLN
INTRAVENOUS | Status: AC
  Filled 2020-08-19: qty 100

## 2020-08-19 MED ORDER — SODIUM CHLORIDE 0.9% FLUSH
3.0000 mL | Freq: Two times a day (BID) | INTRAVENOUS | Status: DC
  Administered 2020-08-19: 3 mL via INTRAVENOUS

## 2020-08-19 MED ORDER — LACTATED RINGERS IV SOLN: INTRAVENOUS | Status: DC

## 2020-08-19 MED ORDER — HYDROCODONE-ACETAMINOPHEN 5-325 MG PO TABS
1.0000 | ORAL_TABLET | ORAL | Status: DC | PRN
  Administered 2020-08-20 – 2020-08-22 (×4): 1 via ORAL
  Filled 2020-08-19 (×4): qty 1

## 2020-08-19 MED ORDER — PROPOFOL 10 MG/ML IV BOLUS
INTRAVENOUS | Status: AC
  Filled 2020-08-19: qty 20

## 2020-08-19 MED ORDER — ONDANSETRON HCL 4 MG/2ML IJ SOLN
INTRAMUSCULAR | Status: DC | PRN
  Administered 2020-08-19: 4 mg via INTRAVENOUS

## 2020-08-19 MED ORDER — VANCOMYCIN HCL 750 MG/150ML IV SOLN
750.0000 mg | Freq: Two times a day (BID) | INTRAVENOUS | Status: DC
  Administered 2020-08-20 – 2020-08-21 (×3): 750 mg via INTRAVENOUS
  Filled 2020-08-19 (×3): qty 150

## 2020-08-19 MED ORDER — SODIUM CHLORIDE 0.9% FLUSH: 3.0000 mL | INTRAVENOUS | Status: DC | PRN

## 2020-08-19 MED ORDER — LIDOCAINE-EPINEPHRINE 0.5 %-1:200000 IJ SOLN
INTRAMUSCULAR | Status: AC
  Filled 2020-08-19: qty 1

## 2020-08-19 MED ORDER — ROCURONIUM BROMIDE 10 MG/ML (PF) SYRINGE
PREFILLED_SYRINGE | INTRAVENOUS | Status: DC | PRN
  Administered 2020-08-19: 20 mg via INTRAVENOUS
  Administered 2020-08-19: 60 mg via INTRAVENOUS

## 2020-08-19 MED ORDER — BACITRACIN ZINC 500 UNIT/GM EX OINT
TOPICAL_OINTMENT | CUTANEOUS | Status: AC
  Filled 2020-08-19: qty 28.35

## 2020-08-19 MED ORDER — VANCOMYCIN HCL 1750 MG/350ML IV SOLN
1750.0000 mg | Freq: Once | INTRAVENOUS | Status: AC
  Administered 2020-08-19: 1750 mg via INTRAVENOUS
  Filled 2020-08-19 (×2): qty 350

## 2020-08-19 MED ORDER — SUGAMMADEX SODIUM 200 MG/2ML IV SOLN
INTRAVENOUS | Status: DC | PRN
  Administered 2020-08-19: 200 mg via INTRAVENOUS

## 2020-08-19 MED ORDER — HEPARIN SODIUM (PORCINE) 5000 UNIT/ML IJ SOLN
5000.0000 [IU] | Freq: Two times a day (BID) | INTRAMUSCULAR | Status: DC
  Administered 2020-08-20 – 2020-08-22 (×5): 5000 [IU] via SUBCUTANEOUS
  Filled 2020-08-19 (×5): qty 1

## 2020-08-19 MED ORDER — THROMBIN 5000 UNITS EX SOLR
CUTANEOUS | Status: AC
  Filled 2020-08-19: qty 5000

## 2020-08-19 MED ORDER — BACITRACIN ZINC 500 UNIT/GM EX OINT
TOPICAL_OINTMENT | CUTANEOUS | Status: DC | PRN
  Administered 2020-08-19: 1 via TOPICAL

## 2020-08-19 MED ORDER — CLONIDINE HCL 0.1 MG/24HR TD PTWK
0.1000 mg | MEDICATED_PATCH | TRANSDERMAL | Status: DC
  Administered 2020-08-19 – 2020-08-26 (×2): 0.1 mg via TRANSDERMAL
  Filled 2020-08-19 (×2): qty 1

## 2020-08-19 MED ORDER — SODIUM CHLORIDE 0.9 % IV SOLN: 250.0000 mL | INTRAVENOUS | Status: DC

## 2020-08-19 MED ORDER — SODIUM CHLORIDE 0.9 % IV SOLN: INTRAVENOUS | Status: DC

## 2020-08-19 MED ORDER — DEXAMETHASONE SODIUM PHOSPHATE 10 MG/ML IJ SOLN
INTRAMUSCULAR | Status: DC | PRN
  Administered 2020-08-19: 10 mg via INTRAVENOUS

## 2020-08-19 MED ORDER — 0.9 % SODIUM CHLORIDE (POUR BTL) OPTIME
TOPICAL | Status: DC | PRN
  Administered 2020-08-19: 1000 mL

## 2020-08-19 MED ORDER — THROMBIN 20000 UNITS EX SOLR
CUTANEOUS | Status: DC | PRN
  Administered 2020-08-19: 20 mL via TOPICAL

## 2020-08-19 MED ORDER — SENNOSIDES-DOCUSATE SODIUM 8.6-50 MG PO TABS: 1.0000 | ORAL_TABLET | Freq: Every evening | ORAL | Status: DC | PRN

## 2020-08-19 MED ORDER — LIDOCAINE-EPINEPHRINE 0.5 %-1:200000 IJ SOLN
INTRAMUSCULAR | Status: DC | PRN
  Administered 2020-08-19: 10 mL via INTRADERMAL

## 2020-08-19 MED ORDER — CHLORHEXIDINE GLUCONATE 0.12 % MT SOLN: 15.0000 mL | Freq: Once | OROMUCOSAL | Status: AC

## 2020-08-19 MED ORDER — THROMBIN 5000 UNITS EX SOLR
OROMUCOSAL | Status: DC | PRN
  Administered 2020-08-19: 5 mL via TOPICAL

## 2020-08-19 MED ORDER — POLYETHYLENE GLYCOL 3350 17 G PO PACK
17.0000 g | PACK | Freq: Two times a day (BID) | ORAL | Status: DC
  Administered 2020-08-19 – 2020-08-26 (×14): 17 g via ORAL
  Filled 2020-08-19 (×15): qty 1

## 2020-08-19 MED ORDER — FENTANYL CITRATE (PF) 250 MCG/5ML IJ SOLN
INTRAMUSCULAR | Status: DC | PRN
  Administered 2020-08-19 (×3): 50 ug via INTRAVENOUS
  Administered 2020-08-19: 150 ug via INTRAVENOUS

## 2020-08-19 MED ORDER — CEFAZOLIN SODIUM-DEXTROSE 2-4 GM/100ML-% IV SOLN
2.0000 g | Freq: Once | INTRAVENOUS | Status: AC
  Administered 2020-08-19: 2 g via INTRAVENOUS

## 2020-08-19 MED ORDER — METHOCARBAMOL 500 MG PO TABS
500.0000 mg | ORAL_TABLET | Freq: Four times a day (QID) | ORAL | Status: DC | PRN
  Administered 2020-08-19 – 2020-08-20 (×2): 500 mg via ORAL
  Filled 2020-08-19 (×2): qty 1

## 2020-08-19 MED ORDER — THROMBIN 20000 UNITS EX SOLR
CUTANEOUS | Status: AC
  Filled 2020-08-19: qty 20000

## 2020-08-19 MED ORDER — PHENOL 1.4 % MT LIQD: 1.0000 | OROMUCOSAL | Status: DC | PRN

## 2020-08-19 MED ORDER — ESMOLOL HCL 100 MG/10ML IV SOLN
INTRAVENOUS | Status: DC | PRN
  Administered 2020-08-19 (×2): 30 mg via INTRAVENOUS

## 2020-08-19 MED ORDER — LIDOCAINE 2% (20 MG/ML) 5 ML SYRINGE
INTRAMUSCULAR | Status: DC | PRN
  Administered 2020-08-19: 40 mg via INTRAVENOUS

## 2020-08-19 MED ORDER — ACETAMINOPHEN 10 MG/ML IV SOLN
INTRAVENOUS | Status: DC | PRN
  Administered 2020-08-19: 1000 mg via INTRAVENOUS

## 2020-08-19 SURGICAL SUPPLY — 54 items
BAND RUBBER #18 3X1/16 STRL (MISCELLANEOUS) ×6 IMPLANT
BENZOIN TINCTURE PRP APPL 2/3 (GAUZE/BANDAGES/DRESSINGS) IMPLANT
BLADE CLIPPER SURG (BLADE) IMPLANT
BUR MATCHSTICK NEURO 3.0 LAGG (BURR) ×3 IMPLANT
BUR PRECISION FLUTE 5.0 (BURR) IMPLANT
CANISTER SUCT 3000ML PPV (MISCELLANEOUS) ×3 IMPLANT
CARTRIDGE OIL MAESTRO DRILL (MISCELLANEOUS) ×1 IMPLANT
CLOSURE WOUND 1/2 X4 (GAUZE/BANDAGES/DRESSINGS)
COVER WAND RF STERILE (DRAPES) ×3 IMPLANT
DECANTER SPIKE VIAL GLASS SM (MISCELLANEOUS) ×3 IMPLANT
DERMABOND ADVANCED (GAUZE/BANDAGES/DRESSINGS) ×2
DERMABOND ADVANCED .7 DNX12 (GAUZE/BANDAGES/DRESSINGS) ×1 IMPLANT
DIFFUSER DRILL AIR PNEUMATIC (MISCELLANEOUS) ×3 IMPLANT
DRAPE LAPAROTOMY 100X72X124 (DRAPES) ×3 IMPLANT
DRAPE MICROSCOPE LEICA (MISCELLANEOUS) ×3 IMPLANT
DRAPE SURG 17X23 STRL (DRAPES) ×3 IMPLANT
DURAPREP 26ML APPLICATOR (WOUND CARE) ×3 IMPLANT
ELECT REM PT RETURN 9FT ADLT (ELECTROSURGICAL) ×3
ELECTRODE REM PT RTRN 9FT ADLT (ELECTROSURGICAL) ×1 IMPLANT
GAUZE 4X4 16PLY RFD (DISPOSABLE) IMPLANT
GAUZE SPONGE 4X4 12PLY STRL (GAUZE/BANDAGES/DRESSINGS) IMPLANT
GLOVE BIO SURGEON STRL SZ 6.5 (GLOVE) ×2 IMPLANT
GLOVE BIO SURGEON STRL SZ7 (GLOVE) ×3 IMPLANT
GLOVE BIO SURGEONS STRL SZ 6.5 (GLOVE) ×1
GLOVE BIOGEL PI IND STRL 8 (GLOVE) ×1 IMPLANT
GLOVE BIOGEL PI INDICATOR 8 (GLOVE) ×2
GLOVE ECLIPSE 6.5 STRL STRAW (GLOVE) ×6 IMPLANT
GLOVE ECLIPSE 8.0 STRL XLNG CF (GLOVE) ×3 IMPLANT
GLOVE EXAM NITRILE XL STR (GLOVE) IMPLANT
GOWN STRL REUS W/ TWL LRG LVL3 (GOWN DISPOSABLE) ×3 IMPLANT
GOWN STRL REUS W/ TWL XL LVL3 (GOWN DISPOSABLE) IMPLANT
GOWN STRL REUS W/TWL 2XL LVL3 (GOWN DISPOSABLE) IMPLANT
GOWN STRL REUS W/TWL LRG LVL3 (GOWN DISPOSABLE) ×6
GOWN STRL REUS W/TWL XL LVL3 (GOWN DISPOSABLE)
KIT BASIN OR (CUSTOM PROCEDURE TRAY) ×3 IMPLANT
KIT TURNOVER KIT B (KITS) ×3 IMPLANT
NEEDLE HYPO 25X1 1.5 SAFETY (NEEDLE) ×3 IMPLANT
NEEDLE SPNL 18GX3.5 QUINCKE PK (NEEDLE) ×6 IMPLANT
NS IRRIG 1000ML POUR BTL (IV SOLUTION) ×3 IMPLANT
OIL CARTRIDGE MAESTRO DRILL (MISCELLANEOUS) ×3
PACK LAMINECTOMY NEURO (CUSTOM PROCEDURE TRAY) ×3 IMPLANT
PAD ABD 8X10 STRL (GAUZE/BANDAGES/DRESSINGS) ×3 IMPLANT
PAD ARMBOARD 7.5X6 YLW CONV (MISCELLANEOUS) ×9 IMPLANT
SPONGE LAP 4X18 RFD (DISPOSABLE) IMPLANT
SPONGE SURGIFOAM ABS GEL SZ50 (HEMOSTASIS) ×3 IMPLANT
STRIP CLOSURE SKIN 1/2X4 (GAUZE/BANDAGES/DRESSINGS) IMPLANT
SUT VIC AB 0 CT1 18XCR BRD8 (SUTURE) ×1 IMPLANT
SUT VIC AB 0 CT1 8-18 (SUTURE) ×2
SUT VIC AB 2-0 CT1 18 (SUTURE) ×3 IMPLANT
SUT VIC AB 3-0 SH 8-18 (SUTURE) ×3 IMPLANT
TAPE CLOTH SURG 4X10 WHT LF (GAUZE/BANDAGES/DRESSINGS) ×3 IMPLANT
TOWEL GREEN STERILE (TOWEL DISPOSABLE) ×3 IMPLANT
TOWEL GREEN STERILE FF (TOWEL DISPOSABLE) ×3 IMPLANT
WATER STERILE IRR 1000ML POUR (IV SOLUTION) ×3 IMPLANT

## 2020-08-19 NOTE — Consult Note (Addendum)
   Providing Compassionate, Quality Care - Together  Neurosurgery Consult  Referring physician: Dr. Tyrell Antonio Reason for referral: LE Weakness  Chief Complaint: LE weakness, back pain  History of Present Illness: This is an 79 year old female with a history of hypertension, chronic kidney disease, diabetes, chronic low back pain that presents with worsening low back pain and lower extremity weakness.  She was scheduled for an L2-4 laminectomy with Dr. Annette Stable on 08/22/2020 however over the past week to week and a half she states she is progressively gotten worse.  She states her low back pain has worsened as well as her lower extremity pain bilaterally.  She also complains of bilateral lower extremity weakness.  She has difficulty moving her legs off of the bed due to her severe pain.  She also had a low-grade fever at [facility] and a slight leukocytosis therefore there is concern for infection.  She also also complaining of severe bilateral knee pain, both of them are very tender to palpation and warm to touch.  She denies any bowel or bladder changes.  She denies any saddle anesthesia.  Thus far her infection work-up is negative.  Her CRP is slightly elevated.  MRI was performed and compared to previous with and without contrast.  There is a questionable small enhancing possible phlegmon at L3-4 along the ligamentum flavum otherwise no significant definitive infection.  There is severe degenerative stenosis at L2-3, L3-4.    Medications: I have reviewed the patient's current medications. Allergies: No Known Allergies  History reviewed. No pertinent family history. Social History:  has no history on file for tobacco use, alcohol use, and drug use.  ROS: 14 point review of systems obtained, all pertinent positives and negatives are listed in HPI above.  Physical Exam:  Vital signs in last 24 hours: Temp:  [98 F (36.7 C)-98.3 F (36.8 C)] 98 F (36.7 C) (07/25 1814) Pulse Rate:   [58-128] 65 (07/26 0746) Resp:  [11-18] 14 (07/26 0217) BP: (138-182)/(65-125) 153/88 (07/26 0700) SpO2:  [91 %-98 %] 96 % (07/26 0746) PE: A&O x2 PERRLA EOMI Face symmetric Bilateral upper extremity strength full Bilateral lower extremity strength 2/5 HF/KE/DF/PF throughout, pain limited. Sensory to light touch appears intact Severe tenderness to palpation bilateral knees, right greater than left.  The right knee is very warm to touch Deep tendon reflexes in the lower extremities are decreased, 1/4  Impression/Assessment:   79 year old female with  1.  L2-L4 severe lumbar stenosis 2.  Lower extremity pain and weakness 3.  Low-grade fever, leukocytosis, concern for epidural abscess versus septic arthritis in the knees  Plan:  -At this time due to her lower extremity weakness I discussed with the daughter the plan to take her for an open laminectomy and decompression.  All of her questions were answered.  Risks and benefits were discussed which included but were not limited to heart attack, stroke, death, CSF leak, infection, hematoma, need for more surgery. -I would also like to consult orthopedics for evaluation of bilateral knees -Continue broad-spectrum antibiotics at this time -IV fluids -MRI did show urinary retention, will place Foley in surgery -Tentatively she has boarded for surgery later this morning   Thank you for allowing me to participate in this patient's care.  Please do not hesitate to call with questions or concerns.   Elwin Sleight, [facility] Cell: [phone]

## 2020-08-19 NOTE — Progress Notes (Signed)
PROGRESS NOTE    Kristin [name]  JJH:[id number] DOB: 11-Jul-1941 DOA: 08/18/2020 PCP: Caren Macadam, MD   Brief Narrative: 79 y/o with PMH significant for chronic low back pain, hypertension, CKD, diabetes who presents to the ED complaining of bilateral lower extremity weakness and worsening lower back pain.  Patient had an MRI 04/23/2020 which show advanced lumbar facet arthrosis with multilevel degenerative anterolisthesis.  Severe spinal stenosis at L2-L3 and L3 4. mild to moderate spinal stenosis at L4-L5.  Over the last 3 days patient has not been able to ambulate due to worsening low back pain and lower extremity weakness.  She also developed urinary retention. EMS evaluation patient had a temperature of 102, tachycardic. MRI order during this admission and showed similar changes, MRI was reviewed by neurosurgeon Dr. Reatha Armour who recommend surgical intervention MRI with possible phlegmon at L3-L4  Assessment & Plan:   Principal Problem:   Lower extremity weakness Active Problems:   SIRS (systemic inflammatory response syndrome) (HCC)   Hypertension   Chronic low back pain with bilateral sciatica  1-SIRS Presents with fever, tachycardia tachypnea. Source of infection or abscess versus septic arthritis of the knees MRI reviewed by neurosurgery think patient has a possible phlegmon at level L3-L4 Plan to start IV vancomycin and cefepime To the OR today for laminectomy and decompression  2-L2 L4 severe lumbar stenosis Lower extremity pain and weakness Neurosurgery consulted and plan for laminectomy and decompression  3-Bilateral knee pain: We will get knee x-ray. Will consult Ortho  4-Hypertension: Continue with carvedilol, Clonidine patch As needed hydralazine  5-Constipation;  Start miralax.   6-urinary retention;  Foley catheter will be place in the OR>     Estimated body mass index is 33.76 kg/m as calculated from the following:   Height as of this encounter: 5'  4" (1.626 m).   Weight as of this encounter: 89.2 kg.   DVT prophylaxis: SCDs, defer to neurosurgery postop anticoagulation for DVT prophylaxis Code Status: Full code Family Communication: No family at bedside at the time of my evaluation, neurosurgery discussed care today with daughter Disposition Plan:  Status is: Inpatient  Remains inpatient appropriate because:IV treatments appropriate due to intensity of illness or inability to take PO   Dispo: The patient is from: Home              Anticipated d/c is to: Be determined              Anticipated d/c date is: 3 days              Patient currently is not medically stable to d/c.  Patient will have laminectomy decompression lumbar spine today.  Evaluation of knee effusion        Consultants:   Neurosurgery  Ortho  Procedures:     Antimicrobials:  Vancomycin and cefepime  Subjective: Patient is alert complaining of knee and leg pain.  She is not able to move right lower extremity due to severe pain and weakness For the last bowel movement a week ago Objective: Vitals:   08/18/20 2354 08/19/20 0024 08/19/20 0431 08/19/20 0959  BP:  (!) 191/91 (!) 188/101 (!) 181/99  Pulse:  95 (!) 108 (!) 101  Resp:  18 16 18   Temp:  98.5 F (36.9 C) 98.7 F (37.1 C) 98 F (36.7 C)  TempSrc:  Oral  Oral  SpO2:  97% 98% 98%  Weight: 89.2 kg     Height: 5\' 4"  (1.626 m)  Intake/Output Summary (Last 24 hours) at 08/19/2020 1005 Last data filed at 08/19/2020 0900 Gross per 24 hour  Intake 406.4 ml  Output 375 ml  Net 31.4 ml   Filed Weights   08/18/20 2354  Weight: 89.2 kg    Examination:  General exam: Appears calm and comfortable  Respiratory system: Clear to auscultation. Respiratory effort normal. Cardiovascular system: S1 & S2 heard, RRR. No JVD, murmurs, rubs, gallops or clicks. No pedal edema. Gastrointestinal system: Abdomen is nondistended, soft and nontender. No organomegaly or masses felt. Normal bowel  sounds heard. Central nervous system: Alert and oriented. No focal neurological deficits. Extremities: Limited movement of right lower extremity due to severe pain, bilateral knee effusion  Data Reviewed: I have personally reviewed following labs and imaging studies  CBC: Recent Labs  Lab 08/18/20 0200  WBC 10.8*  NEUTROABS 7.0  HGB 11.7*  HCT 36.9  MCV 93.2  PLT 354   Basic Metabolic Panel: Recent Labs  Lab 08/18/20 0200  NA 137  K 4.0  CL 101  CO2 25  GLUCOSE 138*  BUN 26*  CREATININE 0.99  CALCIUM 9.6   GFR: Estimated Creatinine Clearance: 49 mL/min (by C-G formula based on SCr of 0.99 mg/dL). Liver Function Tests: Recent Labs  Lab 08/18/20 0200  AST 19  ALT 16  ALKPHOS 79  BILITOT 0.4  PROT 8.0  ALBUMIN 3.9   No results for input(s): LIPASE, AMYLASE in the last 168 hours. No results for input(s): AMMONIA in the last 168 hours. Coagulation Profile: Recent Labs  Lab 08/18/20 1445  INR 1.1   Cardiac Enzymes: Recent Labs  Lab 08/18/20 0200  CKTOTAL 54   BNP (last 3 results) No results for input(s): PROBNP in the last 8760 hours. HbA1C: No results for input(s): HGBA1C in the last 72 hours. CBG: No results for input(s): GLUCAP in the last 168 hours. Lipid Profile: No results for input(s): CHOL, HDL, LDLCALC, TRIG, CHOLHDL, LDLDIRECT in the last 72 hours. Thyroid Function Tests: No results for input(s): TSH, T4TOTAL, FREET4, T3FREE, THYROIDAB in the last 72 hours. Anemia Panel: No results for input(s): VITAMINB12, FOLATE, FERRITIN, TIBC, IRON, RETICCTPCT in the last 72 hours. Sepsis Labs: Recent Labs  Lab 08/18/20 0200  LATICACIDVEN 1.0    Recent Results (from the past 240 hour(s))  Culture, blood (Routine X 2) w Reflex to ID Panel     Status: None (Preliminary result)   Collection Time: 08/18/20 12:19 AM   Specimen: BLOOD RIGHT FOREARM  Result Value Ref Range Status   Specimen Description   Final    BLOOD RIGHT FOREARM Performed at  [facility], [place] [address] [place], [place] [address]    Special Requests   Final    BOTTLES DRAWN AEROBIC AND ANAEROBIC Blood Culture adequate volume Performed at [place] [address], [place], [place] [address]    Culture PENDING  Incomplete   Report Status PENDING  Incomplete  SARS Coronavirus 2 by RT PCR (hospital order, performed in [facility] hospital lab) Nasopharyngeal Nasopharyngeal Swab     Status: None   Collection Time: 08/18/20 12:19 AM   Specimen: Nasopharyngeal Swab  Result Value Ref Range Status   SARS Coronavirus 2 NEGATIVE NEGATIVE Final    Comment: (NOTE) SARS-CoV-2 target nucleic acids are NOT DETECTED.  The SARS-CoV-2 RNA is generally detectable in upper and lower respiratory specimens during the acute phase of infection. The lowest concentration of SARS-CoV-2 viral copies this assay can detect is  250 copies / mL. A negative result does not preclude SARS-CoV-2 infection and should not be used as the sole basis for treatment or other patient management decisions.  A negative result may occur with improper specimen collection / handling, submission of specimen other than nasopharyngeal swab, presence of viral mutation(s) within the areas targeted by this assay, and inadequate number of viral copies (<250 copies / mL). A negative result must be combined with clinical observations, patient history, and epidemiological information.  Fact Sheet for Patients:   StrictlyIdeas.no  Fact Sheet for Healthcare Providers: BankingDealers.co.za  This test is not yet approved or  cleared by the [place] FDA and has been authorized for detection and/or diagnosis of SARS-CoV-2 by FDA under an Emergency Use Authorization (EUA).  This EUA will remain in effect (meaning this test can be used) for the duration of the COVID-19 declaration under Section 564(b)(1) of the Act, 21 U.S.C. section  360bbb-3(b)(1), unless the authorization is terminated or revoked sooner.  Performed at [facility], [place] [address] [place], [place] [address]   Culture, blood (Routine X 2) w Reflex to ID Panel     Status: None (Preliminary result)   Collection Time: 08/18/20  1:56 AM   Specimen: BLOOD LEFT FOREARM  Result Value Ref Range Status   Specimen Description   Final    BLOOD LEFT FOREARM Performed at [facility], [place] [address] [place], [place] [address]    Special Requests   Final    BOTTLES DRAWN AEROBIC AND ANAEROBIC Blood Culture results may not be optimal due to an inadequate volume of blood received in culture bottles Performed at [place] [address] [place], [place] [address]    Culture PENDING  Incomplete   Report Status PENDING  Incomplete  Urine Culture     Status: Abnormal   Collection Time: 08/18/20  4:07 AM   Specimen: Urine, Catheterized  Result Value Ref Range Status   Specimen Description   Final    URINE, CATHETERIZED Performed at [place] [address] [place], [place] [address]    Special Requests   Final    NONE Performed at [facility], [place] [address], [place], [place] [address]    Culture (A)  Final    <10,000 COLONIES/mL INSIGNIFICANT GROWTH Performed at [place] [address], [place],  [address]    Report Status 08/19/2020 FINAL  Final         Radiology Studies: MR Lumbar Spine W Wo Contrast  Result Date: 08/18/2020 CLINICAL DATA:  Bilateral groin pain and low back pain. Generalized weakness. Infection suspected. EXAM: MRI LUMBAR SPINE WITHOUT AND WITH CONTRAST TECHNIQUE: Multiplanar and multiecho pulse sequences of the lumbar spine were obtained without and with intravenous contrast. CONTRAST:  64mL GADAVIST GADOBUTROL 1 MMOL/ML IV SOLN COMPARISON:  04/23/2020 FINDINGS: Segmentation:  5 lumbar type vertebrae Alignment:  Grade 1  anterolisthesis at L2-3 to L4-5, degenerative. Vertebrae:  No fracture, evidence of discitis, or bone lesion. Conus medullaris and cauda equina: Conus extends to the L1 level. Conus appears normal. There is cauda equina distortion due to stenosis. Paraspinal and other soft tissues: Periarticular edematous signal and enhancement about the L2-3 and L3-4 facets. Inter spinous degenerative type enhancement is also seen at L1-2 where a bursa was noted on prior. New STIR hyperintensity in the left psoas when compared to prior. Distended urinary bladder Disc levels: T12- L1: Unremarkable. L1-L2: Interspinous ligamentous  thickening.  Mild facet spurring. L2-L3: Facet osteoarthritis with marked ligamentum flavum thickening and bulky spurring. There is related anterolisthesis. The disc is bulging with superimposed biforaminal protrusion. Severe spinal stenosis. Left more than right moderate foraminal impingement. L3-L4: Facet osteoarthritis with bulky spurring and anterolisthesis. The disc is bulging with a broad left eccentric protrusion. Severe spinal stenosis. Asymmetric left foraminal impingement. L4-L5: Facet osteoarthritis with spurring and anterolisthesis. There may be facet ankylosis. The disc is bulging with annular fissuring. Mild left foraminal stenosis. Mild triangular narrowing of the thecal sac. L5-S1:Disc narrowing and bulging with left paracentral protrusion and annular fissure contacting the descending S1 nerve root. Degenerative facet spurring with mild left foraminal narrowing. IMPRESSION: 1. Strain of the left psoas that is new since 04/23/2020. 2. Distended urinary bladder, please correlate for retention. 3. Advanced facet osteoarthritis at L2-3 and below. Posterior periarticular inflammation is seen at L1-2 to L3-4. 4. Advanced spinal stenosis at L2-3 and L3-4. 5. Multilevel moderate foraminal impingement as described. Electronically Signed   By: Monte Fantasia M.D.   On: 08/18/2020 08:25   DG Chest  Port 1 View  Result Date: 08/18/2020 CLINICAL DATA:  Fever and body aches for 24 hours EXAM: PORTABLE CHEST 1 VIEW COMPARISON:  None. FINDINGS: Shallow inspiration. Heart size and pulmonary vascularity are normal. Lungs are clear. No pleural effusions. No pneumothorax. Mediastinal contours appear intact. Degenerative changes in the spine and shoulders. Calcification of the aorta. IMPRESSION: No active disease. Electronically Signed   By: Lucienne Capers M.D.   On: 08/18/2020 00:45        Scheduled Meds: . carvedilol  12.5 mg Oral BID WC  . cholecalciferol  3,000 Units Oral Daily  . cloNIDine  0.1 mg Transdermal Weekly  . losartan  25 mg Oral Daily  . polyethylene glycol  17 g Oral BID  . rosuvastatin  20 mg Oral QHS   Continuous Infusions: . sodium chloride 100 mL/hr at 08/19/20 0107     LOS: 1 day    Time spent:35 minutes    Venetta Knee A Azavier Creson, MD [facility]   If 7PM-7AM, please contact night-coverage [email or web address]  08/19/2020, 10:05 AM

## 2020-08-19 NOTE — Anesthesia Preprocedure Evaluation (Addendum)
Anesthesia Evaluation  Patient identified by MRN, date of birth, ID band Patient awake    Reviewed: Allergy & Precautions, NPO status , Patient's Chart, lab work & pertinent test results  Airway Mallampati: III  TM Distance: >3 FB Neck ROM: Full    Dental  (+) Teeth Intact, Dental Advisory Given   Pulmonary neg pulmonary ROS,    breath sounds clear to auscultation       Cardiovascular hypertension, Pt. on medications and Pt. on home beta blockers  Rhythm:Regular Rate:Tachycardia     Neuro/Psych  Neuromuscular disease    GI/Hepatic negative GI ROS, Neg liver ROS,   Endo/Other  diabetes  Renal/GU Renal disease     Musculoskeletal   Abdominal (+) + obese,   Peds  Hematology   Anesthesia Other Findings   Reproductive/Obstetrics                            Anesthesia Physical Anesthesia Plan  ASA: III  Anesthesia Plan: General   Post-op Pain Management:    Induction: Intravenous  PONV Risk Score and Plan: 4 or greater and Ondansetron, Dexamethasone and Treatment may vary due to age or medical condition  Airway Management Planned: Oral ETT  Additional Equipment: None  Intra-op Plan:   Post-operative Plan: Extubation in OR  Informed Consent: I have reviewed the patients History and Physical, chart, labs and discussed the procedure including the risks, benefits and alternatives for the proposed anesthesia with the patient or authorized representative who has indicated his/her understanding and acceptance.     Dental advisory given  Plan Discussed with: CRNA  Anesthesia Plan Comments:        Anesthesia Quick Evaluation

## 2020-08-19 NOTE — Progress Notes (Signed)
Patient ID: Kristin [name], female   DOB: [birth year], 79 y.o.   MRN: [id number] The aspiration from the patient's right knee thus far is consistent with an inflammatory process with 46,000 white blood cells but also intracellular calcium pyrophosphate crystals consistent with gout versus pseudogout.  The treatment for this would be anti-inflammatories which is certainly can to be difficult given the patient's chronic renal issues.  A steroid would likely be appropriate with an intra-articular steroid injection.  I may consider that once the Gram stain and culture comes back.

## 2020-08-19 NOTE — Anesthesia Procedure Notes (Signed)
Procedure Name: Intubation Date/Time: 08/19/2020 11:03 AM Performed by: Griffin Dakin, CRNA Pre-anesthesia Checklist: Patient identified, Emergency Drugs available, Suction available and Patient being monitored Patient Re-evaluated:Patient Re-evaluated prior to induction Oxygen Delivery Method: Circle system utilized Preoxygenation: Pre-oxygenation with 100% oxygen Induction Type: IV induction Ventilation: Mask ventilation without difficulty Laryngoscope Size: Mac and 4 Grade View: Grade I Tube type: Oral Tube size: 7.0 mm Number of attempts: 1 Airway Equipment and Method: Stylet and Oral airway Placement Confirmation: ETT inserted through vocal cords under direct vision,  positive ETCO2 and breath sounds checked- equal and bilateral Secured at: 22 cm Tube secured with: Tape Dental Injury: Teeth and Oropharynx as per pre-operative assessment

## 2020-08-19 NOTE — Consult Note (Signed)
Reason for Consult:  Right knee effusion Referring Physician: Niel Hummer, MD - [facility]  Kristin [name] is an 79 y.o. female.  HPI: The patient is a 79 year old female who was admitted yesterday to the hospital after worsening neurologic function as relates to her bilateral lower extremities and urinary retention.  She was taken to the operating room today by neurosurgery for lumbar decompression.  She is now back to the regular floor bed and resting.  Her daughter is at the bedside.  Orthopedic surgery is consulted due to a large right knee effusion.  There may be also some swelling of the left knee.  I did talk to the daughter at the bedside and she states that her mother does have intermittent swelling of her knees and complains of knee pain from time to time for a long period of time.  She has not had any surgical intervention for either knee.  Past Medical History:  Diagnosis Date  . Diabetes mellitus without complication ([place])   . Hypercholesteremia   . Hypertension   . Renal disorder    CKD Stage IV    History reviewed. No pertinent surgical history.  Family History  Problem Relation Age of Onset  . Diabetes Father     Social History:  reports that she has never smoked. She has never used smokeless tobacco. She reports that she does not drink alcohol and does not use drugs.  Allergies: No Known Allergies  Medications: I have reviewed the patient's current medications.  Results for orders placed or performed during the hospital encounter of 08/18/20 (from the past 48 hour(s))  Culture, blood (Routine X 2) w Reflex to ID Panel     Status: None (Preliminary result)   Collection Time: 08/18/20 12:19 AM   Specimen: BLOOD RIGHT FOREARM  Result Value Ref Range   Specimen Description      BLOOD RIGHT FOREARM Performed at [facility], [address], [place], [place] [address]    Special Requests      BOTTLES DRAWN AEROBIC AND ANAEROBIC Blood Culture  adequate volume Performed at [place] [address] [place], [place] [address]    Culture      NO GROWTH 1 DAY Performed at [place] [address], [place], [place] [address]    Report Status PENDING   SARS Coronavirus 2 by RT PCR (hospital order, performed in [facility] hospital lab) Nasopharyngeal Nasopharyngeal Swab     Status: None   Collection Time: 08/18/20 12:19 AM   Specimen: Nasopharyngeal Swab  Result Value Ref Range   SARS Coronavirus 2 NEGATIVE NEGATIVE    Comment: (NOTE) SARS-CoV-2 target nucleic acids are NOT DETECTED.  The SARS-CoV-2 RNA is generally detectable in upper and lower respiratory specimens during the acute phase of infection. The lowest concentration of SARS-CoV-2 viral copies this assay can detect is 250 copies / mL. A negative result does not preclude SARS-CoV-2 infection and should not be used as the sole basis for treatment or other patient management decisions.  A negative result may occur with improper specimen collection / handling, submission of specimen other than nasopharyngeal swab, presence of viral mutation(s) within the areas targeted by this assay, and inadequate number of viral copies (<250 copies / mL). A negative result must be combined with clinical observations, patient history, and epidemiological information.  Fact Sheet for Patients:   StrictlyIdeas.no  Fact Sheet for Healthcare Providers: BankingDealers.co.za  This test is not yet approved or  cleared by the [place] and has been authorized for detection and/or diagnosis of SARS-CoV-2 by FDA under an Emergency Use Authorization (EUA).  This EUA will remain in effect (meaning this test can be used) for the duration of the COVID-19 declaration under Section 564(b)(1) of the Act, 21 U.S.C. section 360bbb-3(b)(1), unless the authorization is terminated or revoked sooner.  Performed at  [facility], [place] [address] [place], [place] [address]   Culture, blood (Routine X 2) w Reflex to ID Panel     Status: None (Preliminary result)   Collection Time: 08/18/20  1:56 AM   Specimen: BLOOD LEFT FOREARM  Result Value Ref Range   Specimen Description      BLOOD LEFT FOREARM Performed at [facility], [place] [address] [place], [place] [address]    Special Requests      BOTTLES DRAWN AEROBIC AND ANAEROBIC Blood Culture results may not be optimal due to an inadequate volume of blood received in culture bottles Performed at [facility], [place] [address] [place], [place] [address]    Culture      NO GROWTH 1 DAY Performed at [facility] [place] [address] [place], [place] [address]    Report Status PENDING   Comprehensive metabolic panel     Status: Abnormal   Collection Time: 08/18/20  2:00 AM  Result Value Ref Range   Sodium 137 135 - 145 mmol/L   Potassium 4.0 3.5 - 5.1 mmol/L   Chloride 101 98 - 111 mmol/L   CO2 25 22 - 32 mmol/L   Glucose, Bld 138 (H) 70 - 99 mg/dL    Comment: Glucose reference range applies only to samples taken after fasting for at least 8 hours.   BUN 26 (H) 8 - 23 mg/dL   Creatinine, Ser 0.99 0.44 - 1.00 mg/dL   Calcium 9.6 8.9 - 10.3 mg/dL   Total Protein 8.0 6.5 - 8.1 g/dL   Albumin 3.9 3.5 - 5.0 g/dL   AST 19 15 - 41 U/L   ALT 16 0 - 44 U/L   Alkaline Phosphatase 79 38 - 126 U/L   Total Bilirubin 0.4 0.3 - 1.2 mg/dL   GFR calc non Af Amer 54 (L) >60 mL/min   GFR calc Af Amer >60 >60 mL/min   Anion gap 11 5 - 15    Comment: Performed at [facility], [place] [address], [place],  [address]  CBC with Differential     Status: Abnormal   Collection Time: 08/18/20  2:00 AM  Result Value Ref Range   WBC 10.8 (H) 4.0 - 10.5 K/uL   RBC 3.96 3.87 - 5.11 MIL/uL   Hemoglobin 11.7 (L) 12.0 - 15.0 g/dL   HCT 36.9 36 - 46 %   MCV 93.2 80.0 - 100.0 fL   MCH 29.5 26.0 - 34.0 pg    MCHC 31.7 30.0 - 36.0 g/dL   RDW 13.2 11.5 - 15.5 %   Platelets 291 150 - 400 K/uL   nRBC 0.0 0.0 - 0.2 %   Neutrophils Relative % 64 %   Neutro Abs 7.0 1.7 - 7.7 K/uL   Lymphocytes Relative 22 %   Lymphs Abs 2.4 0.7 - 4.0 K/uL   Monocytes Relative 12 %   Monocytes Absolute 1.3 (H) 0 - 1 K/uL   Eosinophils Relative 1 %   Eosinophils Absolute 0.1 0 - 0 K/uL   Basophils Relative 1 %  Basophils Absolute 0.1 0 - 0 K/uL   Immature Granulocytes 0 %   Abs Immature Granulocytes 0.02 0.00 - 0.07 K/uL    Comment: Performed at [facility], [place] [address], [place], [place] [address]  Lactic acid, plasma     Status: None   Collection Time: 08/18/20  2:00 AM  Result Value Ref Range   Lactic Acid, Venous 1.0 0.5 - 1.9 mmol/L    Comment: Performed at [facility], [place] [address] [place], [place] [address]  CK     Status: None   Collection Time: 08/18/20  2:00 AM  Result Value Ref Range   Total CK 54 38.0 - 234.0 U/L    Comment: Performed at [facility], [place] [address] [place], [place] [address]  Urinalysis, Routine w reflex microscopic     Status: Abnormal   Collection Time: 08/18/20  4:07 AM  Result Value Ref Range   Color, Urine STRAW (A) YELLOW   APPearance CLEAR CLEAR   Specific Gravity, Urine 1.011 1.005 - 1.030   pH 6.0 5.0 - 8.0   Glucose, UA NEGATIVE NEGATIVE mg/dL   Hgb urine dipstick NEGATIVE NEGATIVE   Bilirubin Urine NEGATIVE NEGATIVE   Ketones, ur NEGATIVE NEGATIVE mg/dL   Protein, ur NEGATIVE NEGATIVE mg/dL   Nitrite NEGATIVE NEGATIVE   Leukocytes,Ua NEGATIVE NEGATIVE    Comment: Performed at [place] [address] [place], [place] [address]  Urine Culture     Status: Abnormal   Collection Time: 08/18/20  4:07 AM   Specimen: Urine, Catheterized  Result Value Ref Range   Specimen Description      URINE, CATHETERIZED Performed at [facility] [address],  [place], [place] [address]    Special Requests      NONE Performed at [facility], [place] [address], [place], [place] [address]    Culture (A)     <10,000 COLONIES/mL INSIGNIFICANT GROWTH Performed at [place] [address] [place], [place] [address]    Report Status 08/19/2020 FINAL   C-reactive protein     Status: Abnormal   Collection Time: 08/18/20  2:45 PM  Result Value Ref Range   CRP 11.5 (H) <1.0 mg/dL    Comment: Performed at [facility], [place] [address] [place], [place] [address]  Protime-INR     Status: None   Collection Time: 08/18/20  2:45 PM  Result Value Ref Range   Prothrombin Time 13.5 11.4 - 15.2 seconds   INR 1.1 0.8 - 1.2    Comment: (NOTE) INR goal varies based on device and disease states. Performed at [facility], [place] [address], [place], [place] [address]   APTT     Status: None   Collection Time: 08/18/20  2:45 PM  Result Value Ref Range   aPTT 32 24 - 36 seconds    Comment: Performed at [facility], [place] [address] [place],  [address]  Surgical pcr screen     Status: None   Collection Time: 08/19/20  9:45 AM   Specimen: Nasal Mucosa; Nasal Swab  Result Value Ref Range   MRSA, PCR NEGATIVE NEGATIVE   Staphylococcus aureus NEGATIVE NEGATIVE    Comment: (NOTE) The Xpert SA Assay (FDA approved for NASAL specimens in patients 43 years of age and older), is one component of a comprehensive surveillance program. It is not intended to diagnose infection nor to guide  or monitor treatment. Performed at [facility], [place] [address], [place], [place] [address]     MR Lumbar Spine W Wo Contrast  Result Date: 08/18/2020 CLINICAL DATA:  Bilateral groin pain and low back pain. Generalized weakness. Infection suspected. EXAM: MRI LUMBAR SPINE WITHOUT AND WITH CONTRAST TECHNIQUE: Multiplanar and multiecho pulse sequences of the lumbar spine were obtained without  and with intravenous contrast. CONTRAST:  39mL GADAVIST GADOBUTROL 1 MMOL/ML IV SOLN COMPARISON:  04/23/2020 FINDINGS: Segmentation:  5 lumbar type vertebrae Alignment:  Grade 1 anterolisthesis at L2-3 to L4-5, degenerative. Vertebrae:  No fracture, evidence of discitis, or bone lesion. Conus medullaris and cauda equina: Conus extends to the L1 level. Conus appears normal. There is cauda equina distortion due to stenosis. Paraspinal and other soft tissues: Periarticular edematous signal and enhancement about the L2-3 and L3-4 facets. Inter spinous degenerative type enhancement is also seen at L1-2 where a bursa was noted on prior. New STIR hyperintensity in the left psoas when compared to prior. Distended urinary bladder Disc levels: T12- L1: Unremarkable. L1-L2: Interspinous ligamentous thickening.  Mild facet spurring. L2-L3: Facet osteoarthritis with marked ligamentum flavum thickening and bulky spurring. There is related anterolisthesis. The disc is bulging with superimposed biforaminal protrusion. Severe spinal stenosis. Left more than right moderate foraminal impingement. L3-L4: Facet osteoarthritis with bulky spurring and anterolisthesis. The disc is bulging with a broad left eccentric protrusion. Severe spinal stenosis. Asymmetric left foraminal impingement. L4-L5: Facet osteoarthritis with spurring and anterolisthesis. There may be facet ankylosis. The disc is bulging with annular fissuring. Mild left foraminal stenosis. Mild triangular narrowing of the thecal sac. L5-S1:Disc narrowing and bulging with left paracentral protrusion and annular fissure contacting the descending S1 nerve root. Degenerative facet spurring with mild left foraminal narrowing. IMPRESSION: 1. Strain of the left psoas that is new since 04/23/2020. 2. Distended urinary bladder, please correlate for retention. 3. Advanced facet osteoarthritis at L2-3 and below. Posterior periarticular inflammation is seen at L1-2 to L3-4. 4. Advanced  spinal stenosis at L2-3 and L3-4. 5. Multilevel moderate foraminal impingement as described. Electronically Signed   By: Monte Fantasia M.D.   On: 08/18/2020 08:25   DG Lumbar Spine 1 View  Result Date: 08/19/2020 CLINICAL DATA:  L2-L4 laminectomy. EXAM: OPERATIVE LUMBAR SPINE 1 VIEW(S) COMPARISON:  MRI lumbar spine 08/18/2020. FINDINGS: A single spot LATERAL image obtained with the C-arm fluoroscopic device at 11:50 a.m. is submitted for interpretation post-operatively. A clamp is present on the L4 spinous process. IMPRESSION: L4 localized intraoperatively. Electronically Signed   By: Evangeline Dakin M.D.   On: 08/19/2020 14:00   DG Chest Port 1 View  Result Date: 08/18/2020 CLINICAL DATA:  Fever and body aches for 24 hours EXAM: PORTABLE CHEST 1 VIEW COMPARISON:  None. FINDINGS: Shallow inspiration. Heart size and pulmonary vascularity are normal. Lungs are clear. No pleural effusions. No pneumothorax. Mediastinal contours appear intact. Degenerative changes in the spine and shoulders. Calcification of the aorta. IMPRESSION: No active disease. Electronically Signed   By: Lucienne Capers M.D.   On: 08/18/2020 00:45   DG C-Arm 1-60 Min  Result Date: 08/19/2020 CLINICAL DATA:  L2-L4 laminectomy. EXAM: OPERATIVE LUMBAR SPINE 1 VIEW(S) COMPARISON:  MRI lumbar spine 08/18/2020. FINDINGS: A single spot LATERAL image obtained with the C-arm fluoroscopic device at 11:50 a.m. is submitted for interpretation post-operatively. A clamp is present on the L4 spinous process. IMPRESSION: L4 localized intraoperatively. Electronically Signed   By: Evangeline Dakin M.D.   On: 08/19/2020 14:00  Review of Systems Blood pressure (!) 148/89, pulse 95, temperature 97.8 F (36.6 C), temperature source Oral, resp. rate 19, height 5\' 4"  (1.626 m), weight 89.2 kg, SpO2 96 %. Physical Exam Musculoskeletal:     Right knee: Effusion present. Decreased range of motion. Tenderness present.     Left knee: Effusion  present.   Neither knee has any redness at all.  The left knee has a very minimal effusion that is nonnotable for any type of aspiration.  The right knee is significantly swollen with a large effusion but no redness.  Assessment/Plan: Large effusion right knee  I was able to aspirate between 80 cc - 90 cc of fluid from the right knee.  It was more left straw-colored and consistent with an inflammatory process.  I have sent that fluid off for Gram stain, culture, cell count and crystal analysis.  There is no evidence of redness of the right knee and the left knee had such a minimal effusion I did not aspirate the left knee based on my clinical exam today.  We will follow the aspiration results and make further recommendations accordingly.  All questions and concerns were answered at the bedside when discussing the case with the daughter.  Mcarthur Rossetti 08/19/2020, 2:48 PM

## 2020-08-19 NOTE — Progress Notes (Signed)
New Admission Note:   Arrival Method: [place] [facility] - Patient is from home with her daughter. Mental Orientation:  A & O x 4 Telemetry:   Placed on Tele #[id number] = NSR Assessment: Completed Skin:  WNL IV:  Right Hand Pain: Severe lower back pain radiating to groin Tubes:  Purewick applied Safety Measures: Safety Fall Prevention Plan has been given, discussed and signed Admission: Completed 5 MW Orientation: Patient has been orientated to the room, unit and staff.  Family:  None at bedside  Patient has clothes and cell phone with charger at bedside.  She left her eye glasses at home.    Orders have been reviewed and implemented. Will continue to monitor the patient. Call light has been placed within reach and bed alarm has been activated.   Earleen Reaper RN- BC, [facility] Phone number: [phone]

## 2020-08-19 NOTE — Progress Notes (Addendum)
Pharmacy Antibiotic Note  Kristin [name] is a 79 y.o. female admitted on 08/18/2020 with possible phlegmon lumbar spine, concern for epidural abscess vs septic arthritis of bilateral knees.  Pharmacy has been consulted for Vancomycin and Cefepime dosing.    Vancomycin, Cefepime and Metronidazole given in ED at [facility] on 9/10 at 6-7am.  Planning lumbar surgery today and Ortho to be consulted to evaluate knees.   Plan:  Reload vancomycin with 1750 mg IV x 1  Then Vancomycin 750 mg IV q12h  Target vanc troughs 15-20 mcg/ml.  Cefepime 2gm IV q12hrs  Follow renal function, culture data, clinical progress.  Vanc levels at steady state.  Height: 5\' 4"  (162.6 cm) Weight: 89.2 kg (196 lb 10.4 oz) IBW/kg (Calculated) : 54.7  Temp (24hrs), Avg:98.4 F (36.9 C), Min:97.7 F (36.5 C), Max:99.2 F (37.3 C)  Recent Labs  Lab 08/18/20 0200  WBC 10.8*  CREATININE 0.99  LATICACIDVEN 1.0    Estimated Creatinine Clearance: 49 mL/min (by C-G formula based on SCr of 0.99 mg/dL).    No Known Allergies  Antimicrobials this admission:  Vancomycin 9/10 >>  Cefepime 9/10 >>  Metronidazole x 1 on 9/10  Dose adjustments this admission:  n/a  Microbiology results:  9/10 urine: < 10K/ml, insignificant growth  9/10 blood x 2: pending  9/10 COVID: negative  Thank you for allowing pharmacy to be a part of this patient's care.  Arty Baumgartner , [place] Phone: [phone] 08/19/2020 10:06 AM    Addendum:   To peri-op area > pre-op RN reports plan for Cefazolin 2 gm IV pre-op per Dr. Reatha Armour.  Will follow-up for antibiotic plan post-op.  08/19/2020 10:33 AM

## 2020-08-19 NOTE — Anesthesia Postprocedure Evaluation (Signed)
Anesthesia Post Note  Patient: Kristin [name]  Procedure(s) Performed: LUMBAR LAMINECTOMY/DECOMPRESSION  Lumbar two-three, Lumbar three-four (N/A Back)     Patient location during evaluation: PACU Anesthesia Type: General Level of consciousness: awake and alert Pain management: pain level controlled Vital Signs Assessment: post-procedure vital signs reviewed and stable Respiratory status: spontaneous breathing, nonlabored ventilation, respiratory function stable and patient connected to nasal cannula oxygen Cardiovascular status: blood pressure returned to baseline and stable Postop Assessment: no apparent nausea or vomiting Anesthetic complications: no   No complications documented.  Last Vitals:  Vitals:   08/19/20 1413 08/19/20 1703  BP: (!) 148/89 (!) 152/92  Pulse: 95 96  Resp: 19 18  Temp: 36.6 C 36.5 C  SpO2: 96% 100%    Last Pain:  Vitals:   08/19/20 1703  TempSrc: Oral  PainSc:                  Effie Berkshire

## 2020-08-19 NOTE — Transfer of Care (Signed)
Immediate Anesthesia Transfer of Care Note  Patient: Kristin [name]  Procedure(s) Performed: LUMBAR LAMINECTOMY/DECOMPRESSION  Lumbar two-three, Lumbar three-four (N/A Back)  Patient Location: PACU  Anesthesia Type:General  Level of Consciousness: awake, alert  and oriented  Airway & Oxygen Therapy: Patient Spontanous Breathing and Patient connected to face mask oxygen  Post-op Assessment: Report given to RN and Post -op Vital signs reviewed and stable  Post vital signs: Reviewed and stable  Last Vitals:  Vitals Value Taken Time  BP 166/91 08/19/20 1335  Temp 36.1 C 08/19/20 1335  Pulse 95 08/19/20 1337  Resp 16 08/19/20 1337  SpO2 92 % 08/19/20 1337  Vitals shown include unvalidated device data.  Last Pain:  Vitals:   08/19/20 1335  TempSrc:   PainSc: 0-No pain      Patients Stated Pain Goal: 0 (47/09/62 8366)  Complications: No complications documented.

## 2020-08-19 NOTE — Op Note (Signed)
DATE OF SERVICE: 08/19/2020  PREOP DIAGNOSIS:  1. L2-3 severe stenosis 2. L3-4 severe stenosis 3. Bilateral lower extremity radiculopathy and weakness with urinary retention  POSTOP DIAGNOSIS: Same  PROCEDURE: 1. Bilateral L2, L3, partial L4 laminectomy for decompression of neural elements 2. Use of operating microscope 3. Use of intraoperative fluoroscopy  SURGEON: Dr. Pieter Partridge C. Yanelli Zapanta, DO  ASSISTANT: Dr. Ashok Pall, MD  ANESTHESIA: General Endotracheal  EBL: 100cc  SPECIMENS: None  DRAINS: Medium hemovac  COMPLICATIONS: None  CONDITION: Stable  HISTORY: Kristin [name] is a 79 year old female with a history of severe stenosis L2-3, L3-4. She was scheduled originally to have an L2-L4 laminectomy with Dr. Annette Stable on 08/22/2020 however over the past week and a half she has noted progressive lower extremity weakness and worsening low back pain and leg pain. She came to the emergency department at [facility] and repeat MRI was obtained which showed similar findings as well as possible inflammatory versus infectious material along the ligamentum flavum at L3-4. Her weakness was quite significant, she could not lift her legs off the bed and her motor score was 2/5 bilaterally. She also had a low-grade fever and a slight leukocytosis therefore concern for infection was high. She had had a previous epidural steroid injection in June 2021 without any relief of her pain. I discussed with her the risks and benefits of surgery of an open laminectomy L2-L4 including heart attack, stroke, death, infection, further surgery, CSF leak, spinal instability and I also discussed this with her daughter. They both agreed to proceed with surgery due to her progressive weakness. She was also found on MRI to have urinary retention.  PROCEDURE IN DETAIL: After informed consent was obtained and witnessed, the patient was brought to the operating room at [facility]. After induction of  general anesthesia, the patient was positioned on the operative table in the prone position with all pressure points meticulously padded. The skin of the low back was then prepped and draped in the usual sterile fashion.  Under fluoroscopy, the correct level was identified and marked out on the skin, and after timeout was conducted, the skin was infiltrated with local anesthetic. Skin incision was then made sharply and Bovie electrocautery was used to dissect the subcutaneous tissue until the lumbodorsal fascia was identified. The fascia was then incised using Bovie electrocautery and the lamina at the L2-L4 levels was identified and dissection was carried out in the subperiosteal plane. Self-retaining retractor was then placed, and intraoperative x-ray was taken to confirm we were at the correct level.  The operative microscope was sterilely draped and brought into the field and was used for the remainder of the case for decompression of neural elements and microdissection techniques.  L2-3 Laminectomy: Using a high-speed drill, the L2 laminectomy was completed bilaterally with a partial medial facetectomy.  This was carried superiorly to the attachment point of the ligamentum flavum under the L2 lamina.  The superior portion of the L3 lamina was removed with the high-speed drill bilaterally until the epidural space was identified. The ligamentum flavum was then removed bilaterally and the lateral edge of the thecal sac was identified.  The lateral recesses were carefully dissected with a blunt nerve hook to be free from ligamentum flavum.  The ligamentum flavum in the lateral recess was resected with Kerrison rongeurs.  There was a significant adherence bilaterally under the lateral recesses from the overgrown facets noted to the thecal sac. This was dissected freely with a blunt nerve  hook. I did not appreciate any evidence of infectious material at this level whatsoever.  The thecal sac appeared very well  decompressed and pulsatile.  Hemostasis at this level was obtained with Gelfoam and thrombin.  L3-4 Laminectomy: Using a high-speed drill, the remaining L3 laminectomy was completed bilaterally with a partial medial facetectomy.  The superior portion of the L4 lamina was removed with the high-speed drill bilaterally until the epidural space was identified.  The ligamentum flavum was then removed bilaterally and the lateral edge of the thecal sac was identified.  The ligamentum flavum appeared hypertrophied and calcified.  It did not have any appearance of infectious material whatsoever. The lateral recesses were carefully dissected with a blunt nerve hook to be free from ligamentum flavum. The ligamentum flavum in the lateral recess was resected with Kerrison rongeurs. The thecal sac appeared very well decompressed and pulsatile as well as the exiting and traversing nerve root.  I did not appreciate any infectious material in the epidural space. Hemostasis at this level was obtained with Gelfoam and thrombin.  I again explored the epidural space bilaterally using a blunt nerve hook. The blunt nerve hook could pass freely at the L2-3, L3-4 levels bilaterally. The thecal sac appeared very well decompressed.  Hemostasis was then secured using a combination of morcellized Gelfoam and thrombin and bipolar electrocautery. The wound is irrigated with copious amounts of antibiotic saline irrigation and was noted to be hemostatic. A medium Hemovac was tunneled out of the incision.  Self-retaining retractor was then removed, and the wound is closed in layers using a combination of interrupted 0-Vicryl and 2-0 Vicryl stitches. The skin was closed using staples.  Sterile dressing was applied.  At the end of the case all sponge, needle, and instrument counts were correct. The patient was then transferred to the stretcher, extubated and taken to the postanesthesia care unit in stable hemodynamic condition.

## 2020-08-19 NOTE — Progress Notes (Signed)
   08/19/20 0511  Provider Notification  Provider Name/Title Dr. Lelon Huh  Date Provider Notified 08/19/20  Time Provider Notified [phone]  Notification Type Page  Notification Reason Change in status (B/P is elevated at 188/101.)  Response See new orders  Date of Provider Response 08/19/20   Patient arrived to the floor at approximately 0100 this morning.  She was put on the [facility].  Minimal urine output.  Bladder scan showed approximately 255 in the bladder.  Dr. Lelon Huh made aware.  Will continue to monitor output and if bladder scan shows greater than 300 cc in the bladder, will notify MD.  Also, made aware of 188/101 blood pressure medication.  New orders received and implemented.  Will continue to monitor patient.  Earleen Reaper RN

## 2020-08-20 ENCOUNTER — Encounter (HOSPITAL_COMMUNITY): Payer: Self-pay | Admitting: Neurological Surgery

## 2020-08-20 LAB — BASIC METABOLIC PANEL
Anion gap: 12 (ref 5–15)
BUN: 35 mg/dL — ABNORMAL HIGH (ref 8–23)
CO2: 23 mmol/L (ref 22–32)
Calcium: 8.5 mg/dL — ABNORMAL LOW (ref 8.9–10.3)
Chloride: 94 mmol/L — ABNORMAL LOW (ref 98–111)
Creatinine, Ser: 1.62 mg/dL — ABNORMAL HIGH (ref 0.44–1.00)
GFR calc Af Amer: 34 mL/min — ABNORMAL LOW (ref >60)
GFR calc non Af Amer: 30 mL/min — ABNORMAL LOW (ref >60)
Glucose, Bld: 194 mg/dL — ABNORMAL HIGH (ref 70–99)
Potassium: 3.8 mmol/L (ref 3.5–5.1)
Sodium: 129 mmol/L — ABNORMAL LOW (ref 135–145)

## 2020-08-20 LAB — CBC
HCT: 28.2 % — ABNORMAL LOW (ref 36.0–46.0)
Hemoglobin: 8.9 g/dL — ABNORMAL LOW (ref 12.0–15.0)
MCH: 28.8 pg (ref 26.0–34.0)
MCHC: 31.6 g/dL (ref 30.0–36.0)
MCV: 91.3 fL (ref 80.0–100.0)
Platelets: 259 10*3/uL (ref 150–400)
RBC: 3.09 MIL/uL — ABNORMAL LOW (ref 3.87–5.11)
RDW: 13.1 % (ref 11.5–15.5)
WBC: 15.1 10*3/uL — ABNORMAL HIGH (ref 4.0–10.5)
nRBC: 0 % (ref 0.0–0.2)

## 2020-08-20 LAB — GLUCOSE, CAPILLARY
Glucose-Capillary: 126 mg/dL — ABNORMAL HIGH (ref 70–99)
Glucose-Capillary: 140 mg/dL — ABNORMAL HIGH (ref 70–99)
Glucose-Capillary: 151 mg/dL — ABNORMAL HIGH (ref 70–99)

## 2020-08-20 MED ORDER — BISACODYL 10 MG RE SUPP
10.0000 mg | Freq: Once | RECTAL | Status: AC
  Administered 2020-08-20: 10 mg via RECTAL
  Filled 2020-08-20: qty 1

## 2020-08-20 MED ORDER — SODIUM CHLORIDE 0.9 % IV SOLN: INTRAVENOUS | Status: DC

## 2020-08-20 MED ORDER — BISACODYL 10 MG RE SUPP: 10.0000 mg | Freq: Every day | RECTAL | Status: DC | PRN

## 2020-08-20 MED ORDER — SENNOSIDES-DOCUSATE SODIUM 8.6-50 MG PO TABS
1.0000 | ORAL_TABLET | Freq: Two times a day (BID) | ORAL | Status: DC
  Administered 2020-08-20 – 2020-08-26 (×13): 1 via ORAL
  Filled 2020-08-20 (×14): qty 1

## 2020-08-20 MED ORDER — COLCHICINE 0.3 MG HALF TABLET
0.3000 mg | ORAL_TABLET | Freq: Every day | ORAL | Status: DC
  Administered 2020-08-20 – 2020-08-26 (×6): 0.3 mg via ORAL
  Filled 2020-08-20 (×8): qty 1

## 2020-08-20 MED ORDER — DICLOFENAC SODIUM 1 % EX GEL
2.0000 g | Freq: Four times a day (QID) | CUTANEOUS | Status: DC
  Administered 2020-08-20 – 2020-08-30 (×37): 2 g via TOPICAL
  Filled 2020-08-20 (×3): qty 100

## 2020-08-20 NOTE — Progress Notes (Signed)
° °  Providing Compassionate, Quality Care - Together  NEUROSURGERY PROGRESS NOTE   S: No issues overnight. Strength in BLE improving  O: EXAM:  BP 104/70    Pulse 97    Temp 98.4 F (36.9 C) (Oral)    Resp 14    Ht 5\' 4"  (1.626 m)    Wt 93.3 kg    SpO2 100%    BMI 35.31 kg/m   Awake, alert, oriented  Speech fluent, appropriate  CN grossly intact  5/5 BUE 4/5 BLE, some limit in RLE KE due to gout in knee  ASSESSMENT:  79 y.o. female with  1.  L2-L4 severe lumbar stenosis 2.  Lower extremity pain and weakness  S/p L2-4 lami on 08/19/2020  PLAN: - pt/ot - mon hmv - pain control - gout per ortho/primary - leg strength improving, dc foley   Thank you for allowing me to participate in this patient's care.  Please do not hesitate to call with questions or concerns.   Elwin Sleight, [facility] Cell: [phone]

## 2020-08-20 NOTE — Progress Notes (Signed)
PROGRESS NOTE    Kristin [name]  JIR:[id number] DOB: Sep 13, 1941 DOA: 08/18/2020 PCP: Caren Macadam, MD   Brief Narrative: 79 y/o with PMH significant for chronic low back pain, hypertension, CKD, diabetes who presents to the ED complaining of bilateral lower extremity weakness and worsening lower back pain.  Patient had an MRI 04/23/2020 which show advanced lumbar facet arthrosis with multilevel degenerative anterolisthesis.  Severe spinal stenosis at L2-L3 and L3 4. mild to moderate spinal stenosis at L4-L5.  Over the last 3 days patient has not been able to ambulate due to worsening low back pain and lower extremity weakness.  She also developed urinary retention. EMS evaluation patient had a temperature of 102, tachycardic. MRI order during this admission and showed similar changes, MRI was reviewed by neurosurgeon Dr. Reatha Armour who recommend surgical intervention MRI with possible phlegmon at L3-L4  Assessment & Plan:   Principal Problem:   Lower extremity weakness Active Problems:   SIRS (systemic inflammatory response syndrome) (HCC)   Hypertension   Chronic low back pain with bilateral sciatica   Effusion of right knee joint  1-SIRS Presents with fever, tachycardia tachypnea. No source for infection, no evidence of infection lumbar spine.  Continue with  IV vancomycin and cefepime for 24 hours.  Underwent laminectomy and decompression, no evidence of infection.  Blood culture no growth to date.  Urine culture insignificant growth. Synovial fluid right knee no growth today.  2-L2 L4 severe lumbar stenosis Lower extremity pain and weakness Neurosurgery consulted and plan for laminectomy and decompression Underwent bilateral L2, L3 partial L4 laminectomy for decompression of neural elements on 9 10/2020 PT eval.   3-Bilateral knee pain: Pseudogout Underwent arthrocentesis of right knee.  Synovial fluid consistent with inflammatory pseudogout. Appreciate Dr. Ninfa Linden. Underwent  injection of Depo-Medrol into the right knee. Voltaren gel ordered. Colchicine ordered  4-Hypertension: Continue with carvedilol, Clonidine patch As needed hydralazine  5-Constipation;  Continue with MiraLAX   6-urinary retention;  Foley catheter will be place in the OR>  She will need voiding trial   Estimated body mass index is 35.31 kg/m as calculated from the following:   Height as of this encounter: 5\' 4"  (1.626 m).   Weight as of this encounter: 93.3 kg.   DVT prophylaxis: SCDs, defer to neurosurgery postop anticoagulation for DVT prophylaxis Code Status: Full code Family Communication:  Disposition Plan:  Status is: Inpatient  Remains inpatient appropriate because:IV treatments appropriate due to intensity of illness or inability to take PO   Dispo: The patient is from: Home              Anticipated d/c is to: Be determined              Anticipated d/c date is: 3 days              Patient currently is not medically stable to d/c.  Patient will have laminectomy decompression lumbar spine today.  Evaluation of knee effusion        Consultants:   Neurosurgery  Ortho  Procedures:     Antimicrobials:  Vancomycin and cefepime  Subjective: She is feeling better, pain has improved.  She felt that she can move better her lower extremity. Having hard time moving right leg. No bowel movement Objective: Vitals:   08/19/20 1703 08/19/20 2040 08/20/20 0436 08/20/20 0903  BP: (!) 152/92 140/86 134/80 104/70  Pulse: 96 (!) 102 90 97  Resp: 18 18 18 14   Temp: 97.7 F (36.5 C) 98.7  F (37.1 C) 97.9 F (36.6 C) 98.4 F (36.9 C)  TempSrc: Oral Oral Oral Oral  SpO2: 100% 97% 99% 100%  Weight:   93.3 kg   Height:        Intake/Output Summary (Last 24 hours) at 08/20/2020 0942 Last data filed at 08/20/2020 0437 Gross per 24 hour  Intake 1420 ml  Output 2235 ml  Net -815 ml   Filed Weights   08/18/20 2354 08/20/20 0436  Weight: 89.2 kg 93.3 kg     Examination:  General exam: NAD Respiratory system: CTA Cardiovascular system: S 1, S 2 RRR Gastrointestinal system: BS present, soft, nt Central nervous system: alert Extremities: right knee tender, no redness. BL weakness improved  Data Reviewed: I have personally reviewed following labs and imaging studies  CBC: Recent Labs  Lab 08/18/20 0200 08/19/20 2201  WBC 10.8* 13.7*  NEUTROABS 7.0  --   HGB 11.7* 10.4*  HCT 36.9 32.0*  MCV 93.2 90.9  PLT 291 220   Basic Metabolic Panel: Recent Labs  Lab 08/18/20 0200 08/19/20 2201  NA 137  --   K 4.0  --   CL 101  --   CO2 25  --   GLUCOSE 138*  --   BUN 26*  --   CREATININE 0.99 1.24*  CALCIUM 9.6  --    GFR: Estimated Creatinine Clearance: 40 mL/min (A) (by C-G formula based on SCr of 1.24 mg/dL (H)). Liver Function Tests: Recent Labs  Lab 08/18/20 0200  AST 19  ALT 16  ALKPHOS 79  BILITOT 0.4  PROT 8.0  ALBUMIN 3.9   No results for input(s): LIPASE, AMYLASE in the last 168 hours. No results for input(s): AMMONIA in the last 168 hours. Coagulation Profile: Recent Labs  Lab 08/18/20 1445  INR 1.1   Cardiac Enzymes: Recent Labs  Lab 08/18/20 0200  CKTOTAL 54   BNP (last 3 results) No results for input(s): PROBNP in the last 8760 hours. HbA1C: No results for input(s): HGBA1C in the last 72 hours. CBG: Recent Labs  Lab 08/19/20 1335 08/19/20 1709 08/19/20 2037 08/20/20 0007 08/20/20 0432  GLUCAP 130* 143* 156* 151* 140*   Lipid Profile: No results for input(s): CHOL, HDL, LDLCALC, TRIG, CHOLHDL, LDLDIRECT in the last 72 hours. Thyroid Function Tests: No results for input(s): TSH, T4TOTAL, FREET4, T3FREE, THYROIDAB in the last 72 hours. Anemia Panel: No results for input(s): VITAMINB12, FOLATE, FERRITIN, TIBC, IRON, RETICCTPCT in the last 72 hours. Sepsis Labs: Recent Labs  Lab 08/18/20 0200  LATICACIDVEN 1.0    Recent Results (from the past 240 hour(s))  Culture, blood (Routine  X 2) w Reflex to ID Panel     Status: None (Preliminary result)   Collection Time: 08/18/20 12:19 AM   Specimen: BLOOD RIGHT FOREARM  Result Value Ref Range Status   Specimen Description   Final    BLOOD RIGHT FOREARM Performed at [facility], [place] [address] [place], [place] [address]    Special Requests   Final    BOTTLES DRAWN AEROBIC AND ANAEROBIC Blood Culture adequate volume Performed at [facility] [address] [place], [place] [address]    Culture   Final    NO GROWTH 1 DAY Performed at [facility], [place] [address], [place], [place] [address]    Report Status PENDING  Incomplete  SARS Coronavirus 2 by RT PCR (hospital order, performed in [facility] hospital lab) Nasopharyngeal Nasopharyngeal Swab  Status: None   Collection Time: 08/18/20 12:19 AM   Specimen: Nasopharyngeal Swab  Result Value Ref Range Status   SARS Coronavirus 2 NEGATIVE NEGATIVE Final    Comment: (NOTE) SARS-CoV-2 target nucleic acids are NOT DETECTED.  The SARS-CoV-2 RNA is generally detectable in upper and lower respiratory specimens during the acute phase of infection. The lowest concentration of SARS-CoV-2 viral copies this assay can detect is 250 copies / mL. A negative result does not preclude SARS-CoV-2 infection and should not be used as the sole basis for treatment or other patient management decisions.  A negative result may occur with improper specimen collection / handling, submission of specimen other than nasopharyngeal swab, presence of viral mutation(s) within the areas targeted by this assay, and inadequate number of viral copies (<250 copies / mL). A negative result must be combined with clinical observations, patient history, and epidemiological information.  Fact Sheet for Patients:   StrictlyIdeas.no  Fact Sheet for Healthcare Providers: BankingDealers.co.za  This test is not yet approved or   cleared by the [place] FDA and has been authorized for detection and/or diagnosis of SARS-CoV-2 by FDA under an Emergency Use Authorization (EUA).  This EUA will remain in effect (meaning this test can be used) for the duration of the COVID-19 declaration under Section 564(b)(1) of the Act, 21 U.S.C. section 360bbb-3(b)(1), unless the authorization is terminated or revoked sooner.  Performed at [facility], [place] [address] [place], [place] [address]   Culture, blood (Routine X 2) w Reflex to ID Panel     Status: None (Preliminary result)   Collection Time: 08/18/20  1:56 AM   Specimen: BLOOD LEFT FOREARM  Result Value Ref Range Status   Specimen Description   Final    BLOOD LEFT FOREARM Performed at [facility], [place] [address], [place], [place] [address]    Special Requests   Final    BOTTLES DRAWN AEROBIC AND ANAEROBIC Blood Culture results may not be optimal due to an inadequate volume of blood received in culture bottles Performed at [facility] [address] [place], [place] [address]    Culture   Final    NO GROWTH 1 DAY Performed at [facility], [place] [address] [place], [place] [address]    Report Status PENDING  Incomplete  Urine Culture     Status: Abnormal   Collection Time: 08/18/20  4:07 AM   Specimen: Urine, Catheterized  Result Value Ref Range Status   Specimen Description   Final    URINE, CATHETERIZED Performed at [facility] [address], [place], [place] [address]    Special Requests   Final    NONE Performed at [facility], [place] [address] [place], [place] [address]    Culture (A)  Final    <10,000 COLONIES/mL INSIGNIFICANT GROWTH Performed at [facility] [address] [place], [place] [address]    Report Status 08/19/2020 FINAL  Final  Surgical pcr screen     Status: None   Collection Time: 08/19/20  9:45 AM   Specimen: Nasal Mucosa;  Nasal Swab  Result Value Ref Range Status   MRSA, PCR NEGATIVE NEGATIVE Final   Staphylococcus aureus NEGATIVE NEGATIVE Final    Comment: (NOTE) The Xpert SA Assay (FDA approved for NASAL specimens in patients 43 years of age and older), is one component of a comprehensive surveillance program. It is not intended to diagnose infection nor  to guide or monitor treatment. Performed at [facility], [place] [address] [place], [place] [address]   Body fluid culture     Status: None (Preliminary result)   Collection Time: 08/19/20  2:50 PM   Specimen: Body Fluid  Result Value Ref Range Status   Specimen Description FLUID  Final   Special Requests SYNOVIAL RIGHT KNEE  Final   Gram Stain   Final    MODERATE WBC PRESENT, PREDOMINANTLY PMN NO ORGANISMS SEEN Performed at [facility], [address], [place], [place] [address]    Culture PENDING  Incomplete   Report Status PENDING  Incomplete         Radiology Studies: DG Knee 1-2 Views Left  Result Date: 08/19/2020 CLINICAL DATA:  Pain EXAM: LEFT KNEE - 1-2 VIEW COMPARISON:  None. FINDINGS: There are moderate tricompartmental degenerative changes, greatest within the medial and patellofemoral compartments. There is a small suprapatellar joint effusion. There is no acute displaced fracture or dislocation. There is mild prepatellar soft tissue swelling. IMPRESSION: Negative. Electronically Signed   By: Constance Holster M.D.   On: 08/19/2020 17:27   DG Knee 1-2 Views Right  Result Date: 08/19/2020 CLINICAL DATA:  Pain EXAM: RIGHT KNEE - 1-2 VIEW COMPARISON:  None. FINDINGS: There are end-stage degenerative changes of the right knee, greatest within the medial and patellofemoral compartments. There is no acute displaced fracture or dislocation. No significant joint effusion. There is mild prepatellar soft tissue swelling. Vascular calcifications are noted. IMPRESSION: 1. No acute displaced fracture or dislocation. 2. End-stage  degenerative changes of the right knee, greatest within the medial and patellofemoral compartments. Electronically Signed   By: Constance Holster M.D.   On: 08/19/2020 17:26   DG Lumbar Spine 1 View  Result Date: 08/19/2020 CLINICAL DATA:  L2-L4 laminectomy. EXAM: OPERATIVE LUMBAR SPINE 1 VIEW(S) COMPARISON:  MRI lumbar spine 08/18/2020. FINDINGS: A single spot LATERAL image obtained with the C-arm fluoroscopic device at 11:50 a.m. is submitted for interpretation post-operatively. A clamp is present on the L4 spinous process. IMPRESSION: L4 localized intraoperatively. Electronically Signed   By: Evangeline Dakin M.D.   On: 08/19/2020 14:00   DG C-Arm 1-60 Min  Result Date: 08/19/2020 CLINICAL DATA:  L2-L4 laminectomy. EXAM: OPERATIVE LUMBAR SPINE 1 VIEW(S) COMPARISON:  MRI lumbar spine 08/18/2020. FINDINGS: A single spot LATERAL image obtained with the C-arm fluoroscopic device at 11:50 a.m. is submitted for interpretation post-operatively. A clamp is present on the L4 spinous process. IMPRESSION: L4 localized intraoperatively. Electronically Signed   By: Evangeline Dakin M.D.   On: 08/19/2020 14:00        Scheduled Meds: . carvedilol  12.5 mg Oral BID WC  . Chlorhexidine Gluconate Cloth  6 each Topical Daily  . cholecalciferol  3,000 Units Oral Daily  . cloNIDine  0.1 mg Transdermal Weekly  . colchicine  0.3 mg Oral Daily  . diclofenac Sodium  2 g Topical QID  . heparin injection (subcutaneous)  5,000 Units Subcutaneous Q12H  . losartan  25 mg Oral Daily  . polyethylene glycol  17 g Oral BID  . rosuvastatin  20 mg Oral QHS  . senna-docusate  1 tablet Oral BID  . sodium chloride flush  3 mL Intravenous Q12H   Continuous Infusions: . sodium chloride 100 mL/hr at 08/20/20 0303  . sodium chloride    . sodium chloride    . ceFEPime (MAXIPIME) IV 2 g (08/20/20 0306)  . methocarbamol (ROBAXIN) IV    .  vancomycin 750 mg (08/20/20 0432)     LOS: 2 days    Time spent:35  minutes    Majd Tissue A Zelina Jimerson, MD [facility]   If 7PM-7AM, please contact night-coverage [email or web address]  08/20/2020, 9:42 AM

## 2020-08-20 NOTE — Evaluation (Signed)
Occupational Therapy Evaluation Patient Details Name: Kristin [name] MRN: [id number] DOB: 30-Oct-1941 Today's Date: 08/20/2020    History of Present Illness This is an 79 year old female with a history of hypertension, chronic kidney disease, diabetes, chronic low back pain that presents with worsening low back pain and lower extremity weakness. Now s/p Lumbar Laminectomy for decompression; Also with knee pain, Ortho is addressing, considering injections   Clinical Impression   Patient is s/p Laminectomy decompression surgery resulting in functional limitations due to the deficits listed below (see OT problem list). In addition pt with gout in R knee area limiting transfers. Pt motivated and willing to participate. Pt able to take a few steps and complete sit<>stand transfer 4 times.  Patient will benefit from skilled OT acutely to increase independence and safety with ADLS to allow discharge home.     Follow Up Recommendations  Home health OT    Equipment Recommendations  3 in 1 bedside commode    Recommendations for Other Services       Precautions / Restrictions Precautions Precautions: Back Precaution Comments: educated on position change needed for back precautions      Mobility Bed Mobility Overal bed mobility: Needs Assistance Bed Mobility: Supine to Sit     Supine to sit: +2 for physical assistance;Max assist     General bed mobility comments: pt requires total support of R LE for transfer. pt with modified helicopter for first attempt OOB  Transfers Overall transfer level: Needs assistance Equipment used: Rolling walker (2 wheeled) Transfers: Sit to/from Stand Sit to Stand: +2 physical assistance;Mod assist         General transfer comment: mod cues each time for hand placement to power up    Balance Overall balance assessment: Needs assistance Sitting-balance support: Bilateral upper extremity supported;Feet supported Sitting balance-Leahy Scale: Fair                                      ADL either performed or assessed with clinical judgement   ADL Overall ADL's : Needs assistance/impaired Eating/Feeding: Independent   Grooming: Wash/dry hands;Sitting;Modified independent   Upper Body Bathing: Minimal assistance;Sitting   Lower Body Bathing: Maximal assistance;Sit to/from stand           Toilet Transfer: +2 for physical assistance;Moderate assistance;RW;BSC Toilet Transfer Details (indicate cue type and reason): pt standing and therapist placign commode behind the patient   Toileting - Clothing Manipulation Details (indicate cue type and reason): no void       General ADL Comments: pt progressed with RW this session to static standing and moving two steps . pt transfered from bed to 3n1 and then to chair and then from chair to try steps     Vision Baseline Vision/History: Wears glasses Wears Glasses: Reading only       Perception     Praxis      Pertinent Vitals/Pain Pain Assessment: Faces Faces Pain Scale: Hurts whole lot Pain Location: R knee  Pain Descriptors / Indicators: Constant;Discomfort;Grimacing;Guarding Pain Intervention(s): Monitored during session;Premedicated before session;Repositioned     Hand Dominance Right   Extremity/Trunk Assessment Upper Extremity Assessment Upper Extremity Assessment: Overall WFL for tasks assessed   Lower Extremity Assessment Lower Extremity Assessment: Defer to PT evaluation;RLE deficits/detail RLE Deficits / Details: gout present   Cervical / Trunk Assessment Cervical / Trunk Assessment: Other exceptions (s/p surg)   Communication Communication Communication: No difficulties   Cognition  Arousal/Alertness: Awake/alert Behavior During Therapy: WFL for tasks assessed/performed Overall Cognitive Status: Within Functional Limits for tasks assessed                                     General Comments  pt with  hema vac drain from  surgerical back area. pt tolerated entire session on RA. Pt with 3L Pony removed at thist ime    Exercises     Shoulder Instructions      Home Living Family/patient expects to be discharged to:: Private residence Living Arrangements: Children Available Help at Discharge: Family Type of Home: House             Bathroom Shower/Tub: Walk-in shower   Bathroom Toilet: Handicapped height     Home Equipment: Environmental consultant - 2 wheels          Prior Functioning/Environment Level of Independence: Independent                 OT Problem List: Decreased strength;Decreased activity tolerance;Impaired balance (sitting and/or standing);Decreased safety awareness;Decreased knowledge of use of DME or AE;Decreased knowledge of precautions;Obesity;Pain      OT Treatment/Interventions: Self-care/ADL training;Therapeutic exercise;Energy conservation;DME and/or AE instruction;Manual therapy;Therapeutic activities;Patient/family education;Balance training    OT Goals(Current goals can be found in the care plan section) Acute Rehab OT Goals Patient Stated Goal: to have R knee not hurt OT Goal Formulation: With patient Time For Goal Achievement: 09/03/20 Potential to Achieve Goals: Good  OT Frequency: Min 2X/week   Barriers to D/C:            Co-evaluation PT/OT/SLP Co-Evaluation/Treatment: Yes Reason for Co-Treatment: For patient/therapist safety;To address functional/ADL transfers   OT goals addressed during session: ADL's and self-care;Proper use of Adaptive equipment and DME      AM-PAC OT "6 Clicks" Daily Activity     Outcome Measure Help from another person eating meals?: None Help from another person taking care of personal grooming?: A Little Help from another person toileting, which includes using toliet, bedpan, or urinal?: A Little Help from another person bathing (including washing, rinsing, drying)?: A Lot Help from another person to put on and taking off regular upper  body clothing?: A Lot Help from another person to put on and taking off regular lower body clothing?: A Lot 6 Click Score: 16   End of Session Equipment Utilized During Treatment: Gait belt;Rolling walker Nurse Communication: Mobility status;Precautions  Activity Tolerance: Patient tolerated treatment well Patient left: in chair;with call bell/phone within reach;with chair alarm set  OT Visit Diagnosis: Unsteadiness on feet (R26.81);Muscle weakness (generalized) (M62.81);Pain Pain - Right/Left: Right Pain - part of body: Leg                Time: 1101-1140 OT Time Calculation (min): 39 min Charges:  OT General Charges $OT Visit: 1 Visit OT Evaluation $OT Eval Moderate Complexity: 1 Mod OT Treatments $Self Care/Home Management : 8-22 mins   Brynn, OTR/L  Acute Rehabilitation Services Pager: [phone] Office: [phone] .   Jeri Modena 08/20/2020, 4:27 PM

## 2020-08-20 NOTE — Evaluation (Signed)
Physical Therapy Evaluation Patient Details Name: Kristin [name] MRN: [id number] DOB: [birth year] Today's Date: 08/20/2020   History of Present Illness  This is an 79 year old female with a history of hypertension, chronic kidney disease, diabetes, chronic low back pain that presents with worsening low back pain and lower extremity weakness. Now s/p Lumbar Laminectomy for decompression; Also with knee pain, Ortho is addressing, considering injections  Clinical Impression   Patient is s/p above surgery resulting in functional limitations due to the deficits listed below (see PT Problem List). Comes from home where she lives with her daughter, who works days, in a single level home with a few steps  to enter; Independent at baseline; Presents to PT with severe R knee gout pain limiting activity tolerance; Needs Max assist for bed mobility, Mod assist for transfers and initial efforts at amb; Patient will benefit from skilled PT to increase their independence and safety with mobility to allow discharge to the venue listed below.       Follow Up Recommendations Home health PT;Supervision - Intermittent    Equipment Recommendations  Rolling walker with 5" wheels;3in1 (PT)    Recommendations for Other Services       Precautions / Restrictions Precautions Precautions: Back Precaution Comments: educated on position change needed for back precautions      Mobility  Bed Mobility Overal bed mobility: Needs Assistance Bed Mobility: Supine to Sit     Supine to sit: +2 for physical assistance;Max assist     General bed mobility comments: pt requires total support of R LE for transfer. pt with modified helicopter for first attempt OOB  Transfers Overall transfer level: Needs assistance Equipment used: Rolling walker (2 wheeled) Transfers: Sit to/from Stand Sit to Stand: +2 physical assistance;Mod assist         General transfer comment: mod cues each time for hand placement to power  up  Ambulation/Gait Ambulation/Gait assistance: Mod assist;+2 safety/equipment Gait Distance (Feet): 4 Feet Assistive device: Rolling walker (2 wheeled) Gait Pattern/deviations: Decreased step length - right;Decreased step length - left     General Gait Details: Cues for sequence, step length, and RW use; Heavy dependence on RW to unwiegh painful R knee in stance  Stairs            Wheelchair Mobility    Modified Rankin (Stroke Patients Only)       Balance Overall balance assessment: Needs assistance Sitting-balance support: Bilateral upper extremity supported;Feet supported Sitting balance-Leahy Scale: Fair       Standing balance-Leahy Scale: Poor Standing balance comment: Dependent on RW                             Pertinent Vitals/Pain Pain Assessment: 0-10 Pain Score: 8  Faces Pain Scale: Hurts whole lot Pain Location: R knee  Pain Descriptors / Indicators: Constant;Discomfort;Grimacing;Guarding Pain Intervention(s): Monitored during session;Premedicated before session    [place] expects to be discharged to:: Private residence Living Arrangements: Children Available Help at Discharge: Family;Available PRN/intermittently Type of Home: House Home Access: Stairs to enter Entrance Stairs-Rails: Psychiatric nurse of Steps: 4 Home Layout: One level Home Equipment: Walker - 2 wheels      Prior Function Level of Independence: Independent               Hand Dominance   Dominant Hand: Right    Extremity/Trunk Assessment   Upper Extremity Assessment Upper Extremity Assessment: Defer to OT evaluation  Lower Extremity Assessment Lower Extremity Assessment: Generalized weakness;RLE deficits/detail RLE Deficits / Details: R knee ROM and strength significanly effected by gout RLE: Unable to fully assess due to pain    Cervical / Trunk Assessment Cervical / Trunk Assessment: Other exceptions (s/p  surg)  Communication   Communication: No difficulties  Cognition Arousal/Alertness: Awake/alert Behavior During Therapy: WFL for tasks assessed/performed Overall Cognitive Status: Within Functional Limits for tasks assessed                                        General Comments General comments (skin integrity, edema, etc.): Session conducted on Room Air with O2 sats staying at good levels    Exercises     Assessment/Plan    PT Assessment Patient needs continued PT services  PT Problem List Decreased strength;Decreased range of motion;Decreased activity tolerance;Decreased balance;Decreased mobility;Decreased coordination;Decreased knowledge of use of DME;Decreased safety awareness;Decreased knowledge of precautions;Pain       PT Treatment Interventions DME instruction;Gait training;Stair training;Functional mobility training;Therapeutic activities;Therapeutic exercise;Balance training;Patient/family education    PT Goals (Current goals can be found in the Care Plan section)  Acute Rehab PT Goals Patient Stated Goal: to have R knee not hurt PT Goal Formulation: With patient Time For Goal Achievement: 09/03/20 Potential to Achieve Goals: Good    Frequency Min 5X/week   Barriers to discharge Other (comment) Must be modified independent to safely dc home    Co-evaluation PT/OT/SLP Co-Evaluation/Treatment: Yes Reason for Co-Treatment: For patient/therapist safety;To address functional/ADL transfers PT goals addressed during session: Mobility/safety with mobility OT goals addressed during session: ADL's and self-care;Proper use of Adaptive equipment and DME       AM-PAC PT "6 Clicks" Mobility  Outcome Measure Help needed turning from your back to your side while in a flat bed without using bedrails?: A Lot Help needed moving from lying on your back to sitting on the side of a flat bed without using bedrails?: A Lot Help needed moving to and from a bed to a  chair (including a wheelchair)?: A Lot Help needed standing up from a chair using your arms (e.g., wheelchair or bedside chair)?: A Lot Help needed to walk in hospital room?: A Lot Help needed climbing 3-5 steps with a railing? : A Lot 6 Click Score: 12    End of Session Equipment Utilized During Treatment: Gait belt (at axillae) Activity Tolerance: Patient tolerated treatment well Patient left: in chair;with call bell/phone within reach;with chair alarm set Nurse Communication: Mobility status PT Visit Diagnosis: Unsteadiness on feet (R26.81);Other abnormalities of gait and mobility (R26.89);Pain Pain - Right/Left: Right Pain - part of body: Knee    Time: 1101-1140 PT Time Calculation (min) (ACUTE ONLY): 39 min   Charges:   PT Evaluation $PT Eval Moderate Complexity: 1 Mod          Roney [place], [place]  Acute Rehabilitation Services Pager [phone] Office [phone]   Colletta [place] 08/20/2020, 5:29 PM

## 2020-08-20 NOTE — Progress Notes (Signed)
Patient ID: Kristin [name], female   DOB: Nov 01, 1941, 79 y.o.   MRN: [id number] The patient's knee aspiration of her right knee fortunately does not show any evidence of infection. It does show evidence of intracellular crystals consistent with gout. I talked with the patient about this. Her x-rays also show severe tricompartmental arthritis of both knees. I felt it was best to place a steroid injection in her right knee today and she agrees with this. I was unable to clean the knee with alcohol and place 3 cc of lidocaine with 1 cc of Depo-Medrol into the right knee which she tolerated well. I do feel that this will help with the knee pain and inflammation. Voltaren gel can also be applied topically at least 2-3 times daily if needed. I can see her as an outpatient in the office anytime in the future if she would like.

## 2020-08-21 ENCOUNTER — Inpatient Hospital Stay (HOSPITAL_COMMUNITY): Payer: Medicare HMO

## 2020-08-21 LAB — BASIC METABOLIC PANEL
Anion gap: 11 (ref 5–15)
BUN: 35 mg/dL — ABNORMAL HIGH (ref 8–23)
CO2: 25 mmol/L (ref 22–32)
Calcium: 8.9 mg/dL (ref 8.9–10.3)
Chloride: 98 mmol/L (ref 98–111)
Creatinine, Ser: 1.3 mg/dL — ABNORMAL HIGH (ref 0.44–1.00)
GFR calc Af Amer: 45 mL/min — ABNORMAL LOW (ref >60)
GFR calc non Af Amer: 39 mL/min — ABNORMAL LOW (ref >60)
Glucose, Bld: 162 mg/dL — ABNORMAL HIGH (ref 70–99)
Potassium: 4.2 mmol/L (ref 3.5–5.1)
Sodium: 134 mmol/L — ABNORMAL LOW (ref 135–145)

## 2020-08-21 LAB — CBC
HCT: 28.3 % — ABNORMAL LOW (ref 36.0–46.0)
Hemoglobin: 9 g/dL — ABNORMAL LOW (ref 12.0–15.0)
MCH: 28.8 pg (ref 26.0–34.0)
MCHC: 31.8 g/dL (ref 30.0–36.0)
MCV: 90.7 fL (ref 80.0–100.0)
Platelets: 303 10*3/uL (ref 150–400)
RBC: 3.12 MIL/uL — ABNORMAL LOW (ref 3.87–5.11)
RDW: 12.9 % (ref 11.5–15.5)
WBC: 14.5 10*3/uL — ABNORMAL HIGH (ref 4.0–10.5)
nRBC: 0 % (ref 0.0–0.2)

## 2020-08-21 MED ORDER — SODIUM CHLORIDE 0.9% FLUSH: 10.0000 mL | INTRAVENOUS | Status: DC | PRN

## 2020-08-21 MED ORDER — SODIUM CHLORIDE 0.9% FLUSH
10.0000 mL | Freq: Two times a day (BID) | INTRAVENOUS | Status: DC
  Administered 2020-08-22 – 2020-08-30 (×7): 10 mL

## 2020-08-21 MED ORDER — PANTOPRAZOLE SODIUM 40 MG PO TBEC
40.0000 mg | DELAYED_RELEASE_TABLET | Freq: Every day | ORAL | Status: DC
  Administered 2020-08-21 – 2020-08-22 (×2): 40 mg via ORAL
  Filled 2020-08-21 (×2): qty 1

## 2020-08-21 MED ORDER — IOHEXOL 9 MG/ML PO SOLN
ORAL | Status: AC
  Administered 2020-08-21: 500 mL
  Filled 2020-08-21: qty 1000

## 2020-08-21 MED ORDER — ALUM & MAG HYDROXIDE-SIMETH 200-200-20 MG/5ML PO SUSP
30.0000 mL | Freq: Four times a day (QID) | ORAL | Status: DC | PRN
  Administered 2020-08-21 – 2020-08-22 (×2): 30 mL via ORAL
  Filled 2020-08-21 (×2): qty 30

## 2020-08-21 NOTE — Progress Notes (Signed)
   Providing Compassionate, Quality Care - Together  NEUROSURGERY PROGRESS NOTE   S: No issues overnight. BLE strength continuing to improve, denies leg pain whatsoever, has some moderate R knee pain, less warm to touch  O: EXAM:  BP (!) 151/70 (BP Location: Right Arm)   Pulse 83   Temp 98.1 F (36.7 C) (Oral)   Resp 18   Ht 5\' 4"  (1.626 m)   Wt 93.3 kg   SpO2 95%   BMI 35.31 kg/m   Awake, alert, oriented  Speech fluent, appropriate  CNs grossly intact  5/5 BUE 4+/5 BLE, some limit in RLE KE due to gout in knee  ASSESSMENT:  79 y.o. female with  1.L2-L4 severe lumbar stenosis 2.Lower extremity pain and weakness  S/p L2-4 lami on 08/19/2020  PLAN: - pt/ot - dc [facility] - pain control - gout per ortho/primary - improving appropriately, continue therapy, ok to dc from my standpoint when medically cleared - daughter updated via phone   Thank you for allowing me to participate in this patient's care.  Please do not hesitate to call with questions or concerns.   Elwin Sleight, [facility] Cell: [phone]

## 2020-08-21 NOTE — Progress Notes (Signed)
Patient ID: Kristin [name], female   DOB: 1941/07/14, 79 y.o.   MRN: [id number] The patient's right knee looks much better overall today.  There is minimal swelling at this point and no redness.  She did tolerate the aspiration that I performed on Saturday and then a steroid injection in the right knee yesterday.  She does have known significant osteoarthritis of that knee.  From my standpoint, there is no other intervention that I would provide during this hospitalization.  We can always see her as an outpatient for her knee arthritis.  I let her know that as well.

## 2020-08-21 NOTE — Progress Notes (Signed)
Physical Therapy Treatment Patient Details Name: Kristin [name] MRN: [id number] DOB: 08-Sep-1941 Today's Date: 08/21/2020    History of Present Illness This is an 79 year old female with a history of hypertension, chronic kidney disease, diabetes, chronic low back pain that presents with worsening low back pain and lower extremity weakness. Now s/p Lumbar Laminectomy for decompression; Also with knee pain, Ortho is addressing, considering injections    PT Comments    The pt is making great progress with therapy goals this afternoon. She was able to progress to 80 ft of hallway ambulation with use of minG and RW, and initiation of stair training. The pt reports slightly reduced pain in both her R knee and back following mobility, and will continue to benefit from skilled PT to progress mobility and strengthening to facilitate safe return home.     Follow Up Recommendations  Home health PT;Supervision - Intermittent     Equipment Recommendations  Rolling walker with 5" wheels;3in1 (PT)    Recommendations for Other Services       Precautions / Restrictions Precautions Precautions: Back Precaution Comments: educated on position change needed for back precautions Restrictions Weight Bearing Restrictions: No    Mobility  Bed Mobility Overal bed mobility: Needs Assistance Bed Mobility: Supine to Sit     Supine to sit: Min assist;HOB elevated     General bed mobility comments: minA to elevate trunk from elevated HOB, able to move BLE to EOB without assist  Transfers Overall transfer level: Needs assistance Equipment used: Rolling walker (2 wheeled) Transfers: Sit to/from Stand Sit to Stand: Supervision         General transfer comment: initially minA progressed to supervision from various surfacce swithin session  Ambulation/Gait Ambulation/Gait assistance: Min guard Gait Distance (Feet): 80 Feet Assistive device: Rolling walker (2 wheeled) Gait Pattern/deviations:  Step-through pattern;Trunk flexed Gait velocity: 0.3 m/s Gait velocity interpretation: <1.31 ft/sec, indicative of household ambulator General Gait Details: initially cues for sequence and position in RW, pt able to progress to more fluid step-through pattern. significant trunk flexion but can  correct with cues   Stairs Stairs: Yes Stairs assistance: Min guard Stair Management: One rail Right;Forwards;Step to pattern Number of Stairs: 3 General stair comments: single UE on rail and single UE supported through HHA. LLE leading up and RLE leading down due to pain in R knee   Wheelchair Mobility    Modified Rankin (Stroke Patients Only)       Balance Overall balance assessment: Needs assistance Sitting-balance support: Bilateral upper extremity supported;Feet supported Sitting balance-Leahy Scale: Good     Standing balance support: Bilateral upper extremity supported;During functional activity Standing balance-Leahy Scale: Poor Standing balance comment: Dependent on RW                            Cognition Arousal/Alertness: Awake/alert Behavior During Therapy: WFL for tasks assessed/performed Overall Cognitive Status: Within Functional Limits for tasks assessed                                        Exercises      General Comments General comments (skin integrity, edema, etc.): VSS on RA      Pertinent Vitals/Pain Pain Assessment: Faces Faces Pain Scale: Hurts even more Pain Location: R knee and back initially, pt reports decrease in pain following mobility Pain Descriptors / Indicators: Constant;Discomfort;Grimacing;Guarding Pain Intervention(s):  Limited activity within patient's tolerance;Monitored during session;Repositioned           PT Goals (current goals can now be found in the care plan section) Acute Rehab PT Goals Patient Stated Goal: to have R knee not hurt PT Goal Formulation: With patient Time For Goal Achievement:  09/03/20 Potential to Achieve Goals: Good Progress towards PT goals: Progressing toward goals    Frequency    Min 5X/week      PT Plan Current plan remains appropriate       AM-PAC PT "6 Clicks" Mobility   Outcome Measure  Help needed turning from your back to your side while in a flat bed without using bedrails?: A Little Help needed moving from lying on your back to sitting on the side of a flat bed without using bedrails?: A Little Help needed moving to and from a bed to a chair (including a wheelchair)?: A Little Help needed standing up from a chair using your arms (e.g., wheelchair or bedside chair)?: A Little Help needed to walk in hospital room?: A Little Help needed climbing 3-5 steps with a railing? : A Little 6 Click Score: 18    End of Session Equipment Utilized During Treatment: Gait belt Activity Tolerance: Patient tolerated treatment well Patient left: in chair;with call bell/phone within reach;with chair alarm set Nurse Communication: Mobility status PT Visit Diagnosis: Unsteadiness on feet (R26.81);Other abnormalities of gait and mobility (R26.89);Pain Pain - Right/Left: Right Pain - part of body: Knee     Time: 1333-1400 PT Time Calculation (min) (ACUTE ONLY): 27 min  Charges:  $Gait Training: 23-37 mins                     Karma Ganja, PT, DPT   Acute Rehabilitation Department Pager #: [phone]   Otho Bellows 08/21/2020, 3:37 PM

## 2020-08-21 NOTE — Progress Notes (Signed)
PROGRESS NOTE    Kristin [name]  CHE:[id number] DOB: 1941/11/26 DOA: 08/18/2020 PCP: Caren Macadam, MD   Brief Narrative: 79 y/o with PMH significant for chronic low back pain, hypertension, CKD, diabetes who presents to the ED complaining of bilateral lower extremity weakness and worsening lower back pain.  Patient had an MRI 04/23/2020 which show advanced lumbar facet arthrosis with multilevel degenerative anterolisthesis.  Severe spinal stenosis at L2-L3 and L3 4. mild to moderate spinal stenosis at L4-L5.  Over the last 3 days patient has not been able to ambulate due to worsening low back pain and lower extremity weakness.  She also developed urinary retention. EMS evaluation patient had a temperature of 102, tachycardic. MRI order during this admission and showed similar changes, MRI was reviewed by neurosurgeon Dr. Reatha Armour who recommend surgical intervention MRI with possible phlegmon at L3-L4. No evidence of infection found during lumbar laminectomy.   Assessment & Plan:   Principal Problem:   Lower extremity weakness Active Problems:   SIRS (systemic inflammatory response syndrome) (HCC)   Hypertension   Chronic low back pain with bilateral sciatica   Effusion of right knee joint  1-SIRS Presents with fever, tachycardia tachypnea. No source for infection, no evidence of infection lumbar spine or kene infection Continue with  IV cefepime , will discontinue Vancomycin.  Underwent Laminectomy and decompression, no evidence of infection.  Blood culture no growth to date.  Urine culture insignificant growth. Synovial fluid right knee no growth today. Will check CT abdomen and pelvis to rule out infection.  Follow WBC trend.  2-L2 L4 severe lumbar stenosis Lower extremity pain and weakness Neurosurgery consulted and plan for laminectomy and decompression Underwent bilateral L2, L3 partial L4 laminectomy for decompression of neural elements on 9 10/2020 PT eval recommend HH PT.    Stable for discharge from neurosurgery.   3-Bilateral knee pain: Pseudogout Underwent arthrocentesis of right knee.  Synovial fluid consistent with inflammatory pseudogout. Appreciate Dr. Ninfa Linden. Underwent injection of Depo-Medrol into the right knee. Voltaren gel ordered. Colchicine ordered  4-Hypertension: Continue with carvedilol, Clonidine patch As needed hydralazine  5-Constipation;  Continue with MiraLAX  Had small watery stool yesterday.   6-urinary retention;  Foley catheter will be place in the OR>  She will need voiding trial  7-AKI; renal function improved with IV fluids.  8-Hyponatremia; improved with IV fluids.   Estimated body mass index is 35.31 kg/m as calculated from the following:   Height as of this encounter: 5\' 4"  (1.626 m).   Weight as of this encounter: 93.3 kg.   DVT prophylaxis: SCDs, defer to neurosurgery postop anticoagulation for DVT prophylaxis Code Status: Full code Family Communication:  Disposition Plan:  Status is: Inpatient  Remains inpatient appropriate because:IV treatments appropriate due to intensity of illness or inability to take PO   Dispo: The patient is from: Home              Anticipated d/c is to: Be determined              Anticipated d/c date is: 3 days              Patient currently is not medically stable to d/c.  CT scan to evaluate for SIRS.  Home in 1-2 days. Depending on CT scan results.         Consultants:   Neurosurgery  Ortho  Procedures:     Antimicrobials:  Vancomycin and cefepime  Subjective: She is feeling better. Had small watery BM  yesterday.   Objective: Vitals:   08/20/20 1708 08/20/20 2111 08/21/20 0503 08/21/20 0930  BP: 104/68 (!) 170/101 140/82 (!) 151/70  Pulse: 99 99 84 83  Resp: 18 19 17 18   Temp: 98.2 F (36.8 C) 98.4 F (36.9 C) 98.3 F (36.8 C) 98.1 F (36.7 C)  TempSrc:    Oral  SpO2: 92% 96% 91% 95%  Weight:      Height:        Intake/Output Summary (Last  24 hours) at 08/21/2020 1556 Last data filed at 08/21/2020 0900 Gross per 24 hour  Intake 1682.93 ml  Output 905 ml  Net 777.93 ml   Filed Weights   08/18/20 2354 08/20/20 0436  Weight: 89.2 kg 93.3 kg    Examination:  General exam: NAD Respiratory system: CTA Cardiovascular system: S 1, S 2 RRR Gastrointestinal system: BS present, soft, nt Central nervous system: ALert Extremities: B.L Knee less tender.   Data Reviewed: I have personally reviewed following labs and imaging studies  CBC: Recent Labs  Lab 08/18/20 0200 08/19/20 2201 08/20/20 1429 08/21/20 0237  WBC 10.8* 13.7* 15.1* 14.5*  NEUTROABS 7.0  --   --   --   HGB 11.7* 10.4* 8.9* 9.0*  HCT 36.9 32.0* 28.2* 28.3*  MCV 93.2 90.9 91.3 90.7  PLT 291 287 259 505   Basic Metabolic Panel: Recent Labs  Lab 08/18/20 0200 08/19/20 2201 08/20/20 1429 08/21/20 0237  NA 137  --  129* 134*  K 4.0  --  3.8 4.2  CL 101  --  94* 98  CO2 25  --  23 25  GLUCOSE 138*  --  194* 162*  BUN 26*  --  35* 35*  CREATININE 0.99 1.24* 1.62* 1.30*  CALCIUM 9.6  --  8.5* 8.9   GFR: Estimated Creatinine Clearance: 38.2 mL/min (A) (by C-G formula based on SCr of 1.3 mg/dL (H)). Liver Function Tests: Recent Labs  Lab 08/18/20 0200  AST 19  ALT 16  ALKPHOS 79  BILITOT 0.4  PROT 8.0  ALBUMIN 3.9   No results for input(s): LIPASE, AMYLASE in the last 168 hours. No results for input(s): AMMONIA in the last 168 hours. Coagulation Profile: Recent Labs  Lab 08/18/20 1445  INR 1.1   Cardiac Enzymes: Recent Labs  Lab 08/18/20 0200  CKTOTAL 54   BNP (last 3 results) No results for input(s): PROBNP in the last 8760 hours. HbA1C: No results for input(s): HGBA1C in the last 72 hours. CBG: Recent Labs  Lab 08/19/20 1709 08/19/20 2037 08/20/20 0007 08/20/20 0432 08/20/20 1149  GLUCAP 143* 156* 151* 140* 126*   Lipid Profile: No results for input(s): CHOL, HDL, LDLCALC, TRIG, CHOLHDL, LDLDIRECT in the last 72  hours. Thyroid Function Tests: No results for input(s): TSH, T4TOTAL, FREET4, T3FREE, THYROIDAB in the last 72 hours. Anemia Panel: No results for input(s): VITAMINB12, FOLATE, FERRITIN, TIBC, IRON, RETICCTPCT in the last 72 hours. Sepsis Labs: Recent Labs  Lab 08/18/20 0200  LATICACIDVEN 1.0    Recent Results (from the past 240 hour(s))  Culture, blood (Routine X 2) w Reflex to ID Panel     Status: None (Preliminary result)   Collection Time: 08/18/20 12:19 AM   Specimen: BLOOD RIGHT FOREARM  Result Value Ref Range Status   Specimen Description   Final    BLOOD RIGHT FOREARM Performed at [facility], [place] [address] [place], [place] [address]    Special Requests   Final  BOTTLES DRAWN AEROBIC AND ANAEROBIC Blood Culture adequate volume Performed at [facility] [address], [place], [place] [address]    Culture   Final    NO GROWTH 2 DAYS Performed at [facility] [address] [place], [place] [address]    Report Status PENDING  Incomplete  SARS Coronavirus 2 by RT PCR (hospital order, performed in [facility] hospital lab) Nasopharyngeal Nasopharyngeal Swab     Status: None   Collection Time: 08/18/20 12:19 AM   Specimen: Nasopharyngeal Swab  Result Value Ref Range Status   SARS Coronavirus 2 NEGATIVE NEGATIVE Final    Comment: (NOTE) SARS-CoV-2 target nucleic acids are NOT DETECTED.  The SARS-CoV-2 RNA is generally detectable in upper and lower respiratory specimens during the acute phase of infection. The lowest concentration of SARS-CoV-2 viral copies this assay can detect is 250 copies / mL. A negative result does not preclude SARS-CoV-2 infection and should not be used as the sole basis for treatment or other patient management decisions.  A negative result may occur with improper specimen collection / handling, submission of specimen other than nasopharyngeal swab, presence of viral mutation(s) within the areas targeted  by this assay, and inadequate number of viral copies (<250 copies / mL). A negative result must be combined with clinical observations, patient history, and epidemiological information.  Fact Sheet for Patients:   StrictlyIdeas.no  Fact Sheet for Healthcare Providers: BankingDealers.co.za  This test is not yet approved or  cleared by the [place] FDA and has been authorized for detection and/or diagnosis of SARS-CoV-2 by FDA under an Emergency Use Authorization (EUA).  This EUA will remain in effect (meaning this test can be used) for the duration of the COVID-19 declaration under Section 564(b)(1) of the Act, 21 U.S.C. section 360bbb-3(b)(1), unless the authorization is terminated or revoked sooner.  Performed at [facility], [place] [address] [place], [place] [address]   Culture, blood (Routine X 2) w Reflex to ID Panel     Status: None (Preliminary result)   Collection Time: 08/18/20  1:56 AM   Specimen: BLOOD LEFT FOREARM  Result Value Ref Range Status   Specimen Description   Final    BLOOD LEFT FOREARM Performed at [facility], [place] [address] [place], [place] [address]    Special Requests   Final    BOTTLES DRAWN AEROBIC AND ANAEROBIC Blood Culture results may not be optimal due to an inadequate volume of blood received in culture bottles Performed at [facility] [address], [place], [place] [address]    Culture   Final    NO GROWTH 2 DAYS Performed at [facility] [address] [place], [place] [address]    Report Status PENDING  Incomplete  Urine Culture     Status: Abnormal   Collection Time: 08/18/20  4:07 AM   Specimen: Urine, Catheterized  Result Value Ref Range Status   Specimen Description   Final    URINE, CATHETERIZED Performed at [facility] [address], [place], [place] [address]    Special Requests   Final     NONE Performed at [facility], [place] [address], [place], [place] [address]    Culture (A)  Final    <10,000 COLONIES/mL INSIGNIFICANT GROWTH Performed at [facility] [address] [place], [place] [address]    Report Status 08/19/2020 FINAL  Final  Surgical pcr screen  Status: None   Collection Time: 08/19/20  9:45 AM   Specimen: Nasal Mucosa; Nasal Swab  Result Value Ref Range Status   MRSA, PCR NEGATIVE NEGATIVE Final   Staphylococcus aureus NEGATIVE NEGATIVE Final    Comment: (NOTE) The Xpert SA Assay (FDA approved for NASAL specimens in patients 104 years of age and older), is one component of a comprehensive surveillance program. It is not intended to diagnose infection nor to guide or monitor treatment. Performed at [facility], [place] [address] [place], [place] [address]   Body fluid culture     Status: None (Preliminary result)   Collection Time: 08/19/20  2:50 PM   Specimen: Body Fluid  Result Value Ref Range Status   Specimen Description FLUID  Final   Special Requests SYNOVIAL RIGHT KNEE  Final   Gram Stain   Final    MODERATE WBC PRESENT, PREDOMINANTLY PMN NO ORGANISMS SEEN    Culture   Final    NO GROWTH 2 DAYS Performed at [facility], [address] [place], [place] [address]    Report Status PENDING  Incomplete         Radiology Studies: No results found.      Scheduled Meds: . carvedilol  12.5 mg Oral BID WC  . Chlorhexidine Gluconate Cloth  6 each Topical Daily  . cholecalciferol  3,000 Units Oral Daily  . cloNIDine  0.1 mg Transdermal Weekly  . colchicine  0.3 mg Oral Daily  . diclofenac Sodium  2 g Topical QID  . heparin injection (subcutaneous)  5,000 Units Subcutaneous Q12H  . losartan  25 mg Oral Daily  . pantoprazole  40 mg Oral Daily  . polyethylene glycol  17 g Oral BID  . rosuvastatin  20 mg Oral QHS  . senna-docusate  1 tablet Oral BID  . sodium chloride flush  10-40 mL  Intracatheter Q12H  . sodium chloride flush  3 mL Intravenous Q12H   Continuous Infusions: . sodium chloride    . sodium chloride 75 mL/hr at 08/20/20 1501  . ceFEPime (MAXIPIME) IV 2 g (08/21/20 0501)  . methocarbamol (ROBAXIN) IV       LOS: 3 days    Time spent:35 minutes    Elmarie Shiley, MD [facility]   If 7PM-7AM, please contact night-coverage [email or web address]  08/21/2020, 3:56 PM

## 2020-08-21 NOTE — Progress Notes (Signed)
Occupational Therapy Treatment Patient Details Name: Kristin [name] MRN: [id number] DOB: June 10, 1941 Today's Date: 08/21/2020    History of present illness This is an 79 year old female with a history of hypertension, chronic kidney disease, diabetes, chronic low back pain that presents with worsening low back pain and lower extremity weakness. Now s/p Lumbar Laminectomy for decompression; Also with knee pain, Ortho is addressing, considering injections   OT comments  Pt progressed this session with adls and basic transfers. Pt could benefit next session on additional education with AE for LB dressing. OT to follow acutely.    Follow Up Recommendations  Home health OT    Equipment Recommendations  3 in 1 bedside commode    Recommendations for Other Services      Precautions / Restrictions Precautions Precautions: Back Precaution Comments: educated on position change needed for back precautions Restrictions Weight Bearing Restrictions: No       Mobility Bed Mobility Overal bed mobility: Needs Assistance Bed Mobility: Supine to Sit     Supine to sit: Min assist;HOB elevated     General bed mobility comments: oob on arrival in chair   Transfers Overall transfer level: Needs assistance Equipment used: Rolling walker (2 wheeled) Transfers: Sit to/from Stand Sit to Stand: Supervision         General transfer comment: better placement of hands this session and able to power up from chair without physcial (A)     Balance Overall balance assessment: Needs assistance Sitting-balance support: Bilateral upper extremity supported;Feet supported Sitting balance-Leahy Scale: Good     Standing balance support: Bilateral upper extremity supported;During functional activity Standing balance-Leahy Scale: Poor Standing balance comment: Dependent on RW                           ADL either performed or assessed with clinical judgement   ADL Overall ADL's : Needs  assistance/impaired     Grooming: Supervision/safety;Standing                   Toilet Transfer: Supervision/safety;BSC;RW   Toileting- Clothing Manipulation and Hygiene: Supervision/safety;Sit to/from stand       Functional mobility during ADLs: Supervision/safety;Rolling walker General ADL Comments: pt educated on back preacutions and handout assigned. PT needs further education on LB dressing with AE unable to cross at this time. Pt with poor recall of back precautions     Vision       Perception     Praxis      Cognition Arousal/Alertness: Awake/alert Behavior During Therapy: WFL for tasks assessed/performed Overall Cognitive Status: Impaired/Different from baseline Area of Impairment: Safety/judgement                         Safety/Judgement: Decreased awareness of safety;Decreased awareness of deficits              Exercises     Shoulder Instructions       General Comments reports L LE much better today    Pertinent Vitals/ Pain       Pain Assessment: Faces Faces Pain Scale: Hurts a little bit Pain Location: R knee Pain Descriptors / Indicators: Grimacing Pain Intervention(s): Monitored during session;Premedicated before session;Repositioned  Home Living  Prior Functioning/Environment              Frequency  Min 2X/week        Progress Toward Goals  OT Goals(current goals can now be found in the care plan section)  Progress towards OT goals: Progressing toward goals  Acute Rehab OT Goals Patient Stated Goal: to have R knee not hurt OT Goal Formulation: With patient Time For Goal Achievement: 09/03/20 Potential to Achieve Goals: Good ADL Goals Pt Will Perform Upper Body Dressing: with modified independence;sitting Pt Will Perform Lower Body Dressing: with modified independence;sit to/from stand;with adaptive equipment Pt Will Transfer to Toilet: with  modified independence;ambulating;bedside commode Pt Will Perform Tub/Shower Transfer: Shower transfer;with supervision;rolling walker;ambulating Additional ADL Goal #1: pt will complete bed mobility with 100 back precautions as precursors for adls  Plan Discharge plan remains appropriate    Co-evaluation                 AM-PAC OT "6 Clicks" Daily Activity     Outcome Measure   Help from another person eating meals?: None Help from another person taking care of personal grooming?: A Little Help from another person toileting, which includes using toliet, bedpan, or urinal?: A Little Help from another person bathing (including washing, rinsing, drying)?: A Little Help from another person to put on and taking off regular upper body clothing?: A Little Help from another person to put on and taking off regular lower body clothing?: A Lot 6 Click Score: 18    End of Session Equipment Utilized During Treatment: Rolling walker  OT Visit Diagnosis: Unsteadiness on feet (R26.81);Muscle weakness (generalized) (M62.81);Pain Pain - Right/Left: Right Pain - part of body: Leg   Activity Tolerance Patient tolerated treatment well   Patient Left in bed;with call bell/phone within reach;with bed alarm set   Nurse Communication Mobility status;Precautions        Time: 1435-1510 OT Time Calculation (min): 35 min  Charges: OT General Charges $OT Visit: 1 Visit OT Treatments $Self Care/Home Management : 23-37 mins   Brynn, OTR/L  Acute Rehabilitation Services Pager: [phone] Office: [phone] .    Jeri Modena 08/21/2020, 4:32 PM

## 2020-08-22 ENCOUNTER — Inpatient Hospital Stay (HOSPITAL_COMMUNITY): Payer: Medicare HMO

## 2020-08-22 ENCOUNTER — Other Ambulatory Visit: Payer: Self-pay

## 2020-08-22 DIAGNOSIS — I5021 Acute systolic (congestive) heart failure: Secondary | ICD-10-CM

## 2020-08-22 DIAGNOSIS — I429 Cardiomyopathy, unspecified: Secondary | ICD-10-CM

## 2020-08-22 DIAGNOSIS — J811 Chronic pulmonary edema: Secondary | ICD-10-CM

## 2020-08-22 DIAGNOSIS — I214 Non-ST elevation (NSTEMI) myocardial infarction: Secondary | ICD-10-CM

## 2020-08-22 DIAGNOSIS — E782 Mixed hyperlipidemia: Secondary | ICD-10-CM

## 2020-08-22 DIAGNOSIS — R778 Other specified abnormalities of plasma proteins: Secondary | ICD-10-CM

## 2020-08-22 DIAGNOSIS — J810 Acute pulmonary edema: Secondary | ICD-10-CM

## 2020-08-22 DIAGNOSIS — J9601 Acute respiratory failure with hypoxia: Secondary | ICD-10-CM

## 2020-08-22 DIAGNOSIS — I4891 Unspecified atrial fibrillation: Secondary | ICD-10-CM

## 2020-08-22 LAB — BLOOD GAS, ARTERIAL
Acid-Base Excess: 1.1 mmol/L (ref 0.0–2.0)
Bicarbonate: 24.6 mmol/L (ref 20.0–28.0)
Drawn by: 336832
FIO2: 100
O2 Saturation: 98.3 %
Patient temperature: 37
pCO2 arterial: 35 mmHg (ref 32.0–48.0)
pH, Arterial: 7.46 — ABNORMAL HIGH (ref 7.350–7.450)
pO2, Arterial: 137 mmHg — ABNORMAL HIGH (ref 83.0–108.0)

## 2020-08-22 LAB — CBC
HCT: 30.2 % — ABNORMAL LOW (ref 36.0–46.0)
Hemoglobin: 9.5 g/dL — ABNORMAL LOW (ref 12.0–15.0)
MCH: 28.4 pg (ref 26.0–34.0)
MCHC: 31.5 g/dL (ref 30.0–36.0)
MCV: 90.1 fL (ref 80.0–100.0)
Platelets: 372 10*3/uL (ref 150–400)
RBC: 3.35 MIL/uL — ABNORMAL LOW (ref 3.87–5.11)
RDW: 12.9 % (ref 11.5–15.5)
WBC: 18.7 10*3/uL — ABNORMAL HIGH (ref 4.0–10.5)
nRBC: 0 % (ref 0.0–0.2)

## 2020-08-22 LAB — BASIC METABOLIC PANEL
Anion gap: 11 (ref 5–15)
Anion gap: 13 (ref 5–15)
BUN: 30 mg/dL — ABNORMAL HIGH (ref 8–23)
BUN: 33 mg/dL — ABNORMAL HIGH (ref 8–23)
CO2: 24 mmol/L (ref 22–32)
CO2: 24 mmol/L (ref 22–32)
Calcium: 8.8 mg/dL — ABNORMAL LOW (ref 8.9–10.3)
Calcium: 9.2 mg/dL (ref 8.9–10.3)
Chloride: 97 mmol/L — ABNORMAL LOW (ref 98–111)
Chloride: 94 mmol/L — ABNORMAL LOW (ref 98–111)
Creatinine, Ser: 1.06 mg/dL — ABNORMAL HIGH (ref 0.44–1.00)
Creatinine, Ser: 1.17 mg/dL — ABNORMAL HIGH (ref 0.44–1.00)
GFR calc Af Amer: 57 mL/min — ABNORMAL LOW (ref >60)
GFR calc Af Amer: 51 mL/min — ABNORMAL LOW (ref >60)
GFR calc non Af Amer: 50 mL/min — ABNORMAL LOW (ref >60)
GFR calc non Af Amer: 44 mL/min — ABNORMAL LOW (ref >60)
Glucose, Bld: 162 mg/dL — ABNORMAL HIGH (ref 70–99)
Glucose, Bld: 185 mg/dL — ABNORMAL HIGH (ref 70–99)
Potassium: 3.9 mmol/L (ref 3.5–5.1)
Potassium: 3.7 mmol/L (ref 3.5–5.1)
Sodium: 132 mmol/L — ABNORMAL LOW (ref 135–145)
Sodium: 131 mmol/L — ABNORMAL LOW (ref 135–145)

## 2020-08-22 LAB — TROPONIN I (HIGH SENSITIVITY)
Troponin I (High Sensitivity): >27000 ng/L (ref <18)
Troponin I (High Sensitivity): >27000 ng/L (ref <18)
Troponin I (High Sensitivity): >27000 ng/L (ref <18)
Troponin I (High Sensitivity): >27000 ng/L (ref <18)

## 2020-08-22 LAB — COMPREHENSIVE METABOLIC PANEL
ALT: 66 U/L — ABNORMAL HIGH (ref 0–44)
AST: 190 U/L — ABNORMAL HIGH (ref 15–41)
Albumin: 2.8 g/dL — ABNORMAL LOW (ref 3.5–5.0)
Alkaline Phosphatase: 79 U/L (ref 38–126)
Anion gap: 15 (ref 5–15)
BUN: 34 mg/dL — ABNORMAL HIGH (ref 8–23)
CO2: 21 mmol/L — ABNORMAL LOW (ref 22–32)
Calcium: 9 mg/dL (ref 8.9–10.3)
Chloride: 94 mmol/L — ABNORMAL LOW (ref 98–111)
Creatinine, Ser: 1.13 mg/dL — ABNORMAL HIGH (ref 0.44–1.00)
GFR calc Af Amer: 53 mL/min — ABNORMAL LOW (ref >60)
GFR calc non Af Amer: 46 mL/min — ABNORMAL LOW (ref >60)
Glucose, Bld: 178 mg/dL — ABNORMAL HIGH (ref 70–99)
Potassium: 3.5 mmol/L (ref 3.5–5.1)
Sodium: 130 mmol/L — ABNORMAL LOW (ref 135–145)
Total Bilirubin: 0.5 mg/dL (ref 0.3–1.2)
Total Protein: 7.2 g/dL (ref 6.5–8.1)

## 2020-08-22 LAB — TSH: TSH: 1.054 u[IU]/mL (ref 0.350–4.500)

## 2020-08-22 LAB — ECHOCARDIOGRAM COMPLETE
AR max vel: 1.82 cm2
AV Area VTI: 1.72 cm2
AV Area mean vel: 1.92 cm2
AV Mean grad: 2 mmHg
AV Peak grad: 3.9 mmHg
Ao pk vel: 0.99 m/s
Calc EF: 31.3 %
Height: 64 in
S' Lateral: 2.5 cm
Single Plane A2C EF: 26.6 %
Single Plane A4C EF: 31.5 %
Weight: 3291.03 oz

## 2020-08-22 LAB — PROCALCITONIN: Procalcitonin: 0.69 ng/mL

## 2020-08-22 LAB — BRAIN NATRIURETIC PEPTIDE
B Natriuretic Peptide: 377.8 pg/mL — ABNORMAL HIGH (ref 0.0–100.0)
B Natriuretic Peptide: 833.1 pg/mL — ABNORMAL HIGH (ref 0.0–100.0)

## 2020-08-22 LAB — GLUCOSE, CAPILLARY
Glucose-Capillary: 168 mg/dL — ABNORMAL HIGH (ref 70–99)
Glucose-Capillary: 192 mg/dL — ABNORMAL HIGH (ref 70–99)

## 2020-08-22 LAB — MAGNESIUM: Magnesium: 2 mg/dL (ref 1.7–2.4)

## 2020-08-22 MED ORDER — NITROGLYCERIN 0.4 MG SL SUBL
0.4000 mg | SUBLINGUAL_TABLET | SUBLINGUAL | Status: DC | PRN
  Administered 2020-08-22: 0.4 mg via SUBLINGUAL
  Filled 2020-08-22: qty 1

## 2020-08-22 MED ORDER — FUROSEMIDE 10 MG/ML IJ SOLN
40.0000 mg | Freq: Three times a day (TID) | INTRAMUSCULAR | Status: DC
  Administered 2020-08-22 (×2): 40 mg via INTRAVENOUS
  Filled 2020-08-22 (×2): qty 4

## 2020-08-22 MED ORDER — POTASSIUM CHLORIDE CRYS ER 20 MEQ PO TBCR
20.0000 meq | EXTENDED_RELEASE_TABLET | Freq: Once | ORAL | Status: AC
  Administered 2020-08-22: 20 meq via ORAL
  Filled 2020-08-22: qty 1

## 2020-08-22 MED ORDER — ROSUVASTATIN CALCIUM 20 MG PO TABS
40.0000 mg | ORAL_TABLET | Freq: Every day | ORAL | Status: DC
  Administered 2020-08-22 – 2020-08-29 (×8): 40 mg via ORAL
  Filled 2020-08-22 (×8): qty 2

## 2020-08-22 MED ORDER — AMIODARONE HCL IN DEXTROSE 360-4.14 MG/200ML-% IV SOLN
30.0000 mg/h | INTRAVENOUS | Status: DC
  Administered 2020-08-23 – 2020-08-24 (×3): 30 mg/h via INTRAVENOUS
  Filled 2020-08-22 (×4): qty 200

## 2020-08-22 MED ORDER — HEPARIN (PORCINE) 25000 UT/250ML-% IV SOLN
900.0000 [IU]/h | INTRAVENOUS | Status: DC
  Administered 2020-08-22 (×2): 1150 [IU]/h via INTRAVENOUS
  Administered 2020-08-23: 900 [IU]/h via INTRAVENOUS
  Administered 2020-08-24: 1100 [IU]/h via INTRAVENOUS
  Administered 2020-08-25: 950 [IU]/h via INTRAVENOUS
  Administered 2020-08-27 – 2020-08-28 (×2): 850 [IU]/h via INTRAVENOUS
  Filled 2020-08-22 (×7): qty 250

## 2020-08-22 MED ORDER — FUROSEMIDE 10 MG/ML IJ SOLN
40.0000 mg | Freq: Every day | INTRAMUSCULAR | Status: DC
  Administered 2020-08-22: 40 mg via INTRAVENOUS
  Filled 2020-08-22: qty 4

## 2020-08-22 MED ORDER — METOPROLOL TARTRATE 25 MG PO TABS
25.0000 mg | ORAL_TABLET | Freq: Two times a day (BID) | ORAL | Status: DC
  Administered 2020-08-22 – 2020-08-27 (×10): 25 mg via ORAL
  Filled 2020-08-22 (×11): qty 1

## 2020-08-22 MED ORDER — NITROGLYCERIN IN D5W 200-5 MCG/ML-% IV SOLN
0.0000 ug/min | INTRAVENOUS | Status: DC
  Administered 2020-08-22: 10 ug/min via INTRAVENOUS
  Administered 2020-08-23 – 2020-08-24 (×2): 15 ug/min via INTRAVENOUS
  Filled 2020-08-22 (×2): qty 250

## 2020-08-22 MED ORDER — FUROSEMIDE 10 MG/ML IJ SOLN
20.0000 mg | Freq: Once | INTRAMUSCULAR | Status: AC
  Administered 2020-08-22: 20 mg via INTRAVENOUS
  Filled 2020-08-22: qty 2

## 2020-08-22 MED ORDER — PANTOPRAZOLE SODIUM 40 MG PO TBEC
40.0000 mg | DELAYED_RELEASE_TABLET | Freq: Two times a day (BID) | ORAL | Status: DC
  Administered 2020-08-22 – 2020-08-27 (×9): 40 mg via ORAL
  Filled 2020-08-22 (×10): qty 1

## 2020-08-22 MED ORDER — POTASSIUM CHLORIDE CRYS ER 20 MEQ PO TBCR
20.0000 meq | EXTENDED_RELEASE_TABLET | Freq: Two times a day (BID) | ORAL | Status: DC
  Administered 2020-08-22 – 2020-08-27 (×9): 20 meq via ORAL
  Filled 2020-08-22 (×10): qty 1

## 2020-08-22 MED ORDER — SUCRALFATE 1 GM/10ML PO SUSP
1.0000 g | Freq: Three times a day (TID) | ORAL | Status: DC
  Administered 2020-08-22 – 2020-08-30 (×30): 1 g via ORAL
  Filled 2020-08-22 (×31): qty 10

## 2020-08-22 MED ORDER — AMIODARONE HCL IN DEXTROSE 360-4.14 MG/200ML-% IV SOLN
60.0000 mg/h | INTRAVENOUS | Status: DC
  Administered 2020-08-22 (×2): 60 mg/h via INTRAVENOUS
  Filled 2020-08-22: qty 200

## 2020-08-22 MED ORDER — PERFLUTREN LIPID MICROSPHERE
1.0000 mL | INTRAVENOUS | Status: AC | PRN
  Administered 2020-08-22: 3 mL via INTRAVENOUS
  Filled 2020-08-22: qty 10

## 2020-08-22 MED ORDER — AMIODARONE LOAD VIA INFUSION
150.0000 mg | Freq: Once | INTRAVENOUS | Status: AC
  Administered 2020-08-22: 150 mg via INTRAVENOUS
  Filled 2020-08-22: qty 83.34

## 2020-08-22 MED ORDER — AMIODARONE HCL IN DEXTROSE 360-4.14 MG/200ML-% IV SOLN
INTRAVENOUS | Status: AC
  Administered 2020-08-23: 30 mg/h via INTRAVENOUS
  Filled 2020-08-22: qty 200

## 2020-08-22 MED ORDER — FUROSEMIDE 10 MG/ML IJ SOLN
40.0000 mg | Freq: Once | INTRAMUSCULAR | Status: AC
  Administered 2020-08-22: 40 mg via INTRAVENOUS
  Filled 2020-08-22: qty 4

## 2020-08-22 MED ORDER — FUROSEMIDE 10 MG/ML IJ SOLN: 60.0000 mg | Freq: Two times a day (BID) | INTRAMUSCULAR | Status: DC

## 2020-08-22 NOTE — Progress Notes (Addendum)
PROGRESS NOTE    Kristin [name]  VPX:[id number] DOB: [birth year] DOA: 08/18/2020 PCP: Caren Macadam, MD   Brief Narrative: 79 y/o with PMH significant for chronic low back pain, hypertension, CKD, diabetes who presents to the ED complaining of bilateral lower extremity weakness and worsening lower back pain.  Patient had an MRI 04/23/2020 which show advanced lumbar facet arthrosis with multilevel degenerative anterolisthesis.  Severe spinal stenosis at L2-L3 and L3 4. mild to moderate spinal stenosis at L4-L5.  Over the last 3 days patient has not been able to ambulate due to worsening low back pain and lower extremity weakness.  She also developed urinary retention. -EMS evaluation patient had a temperature of 102, tachycardic. -MRI order during this admission and showed similar changes, MRI was reviewed by neurosurgeon Dr. Reatha Armour who recommend surgical intervention MRI with possible phlegmon at L3-L4. No evidence of infection found during lumbar laminectomy.  -Work up for infectious process due to SIRS criteria unrevealing, other than CT abdomen showed ground glass opacity edema, vs infection. Patient has been receiving IV antibiotics.  -Patient develop Acute hypoxic Respiratory Failure 9/14, suspect related to pulmonary edema. ECHO order to evaluate for -Heart failure. New A fib, cardiology has been consulted.     Assessment & Plan:   Principal Problem:   Lower extremity weakness Active Problems:   SIRS (systemic inflammatory response syndrome) (HCC)   Hypertension   Chronic low back pain with bilateral sciatica   Effusion of right knee joint  1-SIRS -Presents with fever, tachycardia tachypnea. -No source for infection, no evidence of infection lumbar spine or kene infection -Continue with  IV cefepime , -Vancomycin discontinue on 9/13. -Underwent Laminectomy and decompression, no evidence of infection.  -Blood culture no growth to date.  Urine culture insignificant  growth. -Synovial fluid right knee no growth today. -CT abdomen negative for infection.  -Chest x ray with pulmonary edema. Ct abdomen pelvis; lung ground-glass opacity edema vs infection.   2-Acute Hypoxic Respiratory Failure;  Chest x ray showed Bilateral Pulmonary edema.  BNP elevated.  New Onset A fib.  Check ECHO.  Cardiology consulted.  Received 40 mg lasix this am, will give another 20 mg. Then 40 every 8 hours.  Check pro-calcitonin, CBC>   3-New A fib; Discussed with neurosurgery, hold on full anticoagulation. Could consider start heparin 9/15, low dose no bolus.  Ideally would like to wait 7 to 10 days for full anticoagulation after surgery.  Cardiology has been consulted.   Abdominal pain, chest pain;  Complaint of indigestion;  check troponin, EKG.  Carafate, troponin,  Addendum;  NSTEMI Troponin significantly elevated at 27,000. Discussed with cardiology, he spoke with neurosurgery plan to start low dose heparin PTT goal 50---60, No bolus.  -no aspirin for now due to increase risk for bleeding, discussed with cardiology Increase statins.    L2 L4 severe lumbar stenosis Lower extremity pain and weakness. Underwent bilateral L2, L3 partial L4 laminectomy for decompression of neural elements on 9 10/2020 PT eval recommend HH PT.  Stable for discharge from neurosurgery.   3-Bilateral knee pain: Pseudogout Underwent arthrocentesis of right knee.  Synovial fluid consistent with inflammatory pseudogout. Appreciate Dr. Ninfa Linden. Underwent injection of Depo-Medrol into the right knee. Voltaren gel ordered. Continue with Colchicine  4-Hypertension: Continue with carvedilol, Clonidine patch As needed hydralazine  5-Constipation;  Continue with MiraLAX  Had good BM 9/13  6-urinary retention;  Foley catheter will be place in the OR>  She has been able to empty her bladder.  7-AKI; renal function improved with IV fluids. Now NSL due to Pulmonary  edema 8-Hyponatremia; Monitor.   Estimated body mass index is 35.31 kg/m as calculated from the following:   Height as of this encounter: 5\' 4"  (1.626 m).   Weight as of this encounter: 93.3 kg.   DVT prophylaxis: SCDs, defer to neurosurgery postop anticoagulation for DVT prophylaxis Code Status: Full code Family Communication: Daughter over phone 9/14 Disposition Plan:  Status is: Inpatient  Remains inpatient appropriate because:IV treatments appropriate due to intensity of illness or inability to take PO   Dispo: The patient is from: Home              Anticipated d/c is to: Be determined              Anticipated d/c date is: 3 days              Patient currently is not medically stable to d/c.  New        Consultants:   Neurosurgery  Ortho  Procedures:     Antimicrobials:  Vancomycin and cefepime  Subjective: She is complaining dyspnea. She is also complaining of heart burn.  Denies abdominal pain. She had large BM yesterday,    Objective: Vitals:   08/22/20 0037 08/22/20 0354 08/22/20 0752 08/22/20 1045  BP:  (!) 148/100 (!) 141/94   Pulse:  100 93   Resp:  20 17   Temp:  98 F (36.7 C) 97.6 F (36.4 C)   TempSrc:  Oral Oral   SpO2: 94% 93% 91% 93%  Weight:      Height:        Intake/Output Summary (Last 24 hours) at 08/22/2020 1121 Last data filed at 08/22/2020 1045 Gross per 24 hour  Intake 4330.07 ml  Output 500 ml  Net 3830.07 ml   Filed Weights   08/18/20 2354 08/20/20 0436  Weight: 89.2 kg 93.3 kg    Examination:  General exam: Mild distress Respiratory system: Bilateral Crackles. No increase work of breathing.  Cardiovascular system: S 1, S 2 IRR Gastrointestinal system: BS present, soft, nt Central nervous system: Alert.  Extremities: no significant tenderness B/L knee.   Data Reviewed: I have personally reviewed following labs and imaging studies  CBC: Recent Labs  Lab 08/18/20 0200 08/19/20 2201 08/20/20 1429  08/21/20 0237  WBC 10.8* 13.7* 15.1* 14.5*  NEUTROABS 7.0  --   --   --   HGB 11.7* 10.4* 8.9* 9.0*  HCT 36.9 32.0* 28.2* 28.3*  MCV 93.2 90.9 91.3 90.7  PLT 291 287 259 242   Basic Metabolic Panel: Recent Labs  Lab 08/18/20 0200 08/19/20 2201 08/20/20 1429 08/21/20 0237 08/22/20 0811  NA 137  --  129* 134* 132*  K 4.0  --  3.8 4.2 3.9  CL 101  --  94* 98 97*  CO2 25  --  23 25 24   GLUCOSE 138*  --  194* 162* 162*  BUN 26*  --  35* 35* 30*  CREATININE 0.99 1.24* 1.62* 1.30* 1.06*  CALCIUM 9.6  --  8.5* 8.9 8.8*   GFR: Estimated Creatinine Clearance: 46.8 mL/min (A) (by C-G formula based on SCr of 1.06 mg/dL (H)). Liver Function Tests: Recent Labs  Lab 08/18/20 0200  AST 19  ALT 16  ALKPHOS 79  BILITOT 0.4  PROT 8.0  ALBUMIN 3.9   No results for input(s): LIPASE, AMYLASE in the last 168 hours. No results for input(s): AMMONIA in the last  168 hours. Coagulation Profile: Recent Labs  Lab 08/18/20 1445  INR 1.1   Cardiac Enzymes: Recent Labs  Lab 08/18/20 0200  CKTOTAL 54   BNP (last 3 results) No results for input(s): PROBNP in the last 8760 hours. HbA1C: No results for input(s): HGBA1C in the last 72 hours. CBG: Recent Labs  Lab 08/19/20 1709 08/19/20 2037 08/20/20 0007 08/20/20 0432 08/20/20 1149  GLUCAP 143* 156* 151* 140* 126*   Lipid Profile: No results for input(s): CHOL, HDL, LDLCALC, TRIG, CHOLHDL, LDLDIRECT in the last 72 hours. Thyroid Function Tests: No results for input(s): TSH, T4TOTAL, FREET4, T3FREE, THYROIDAB in the last 72 hours. Anemia Panel: No results for input(s): VITAMINB12, FOLATE, FERRITIN, TIBC, IRON, RETICCTPCT in the last 72 hours. Sepsis Labs: Recent Labs  Lab 08/18/20 0200  LATICACIDVEN 1.0    Recent Results (from the past 240 hour(s))  Culture, blood (Routine X 2) w Reflex to ID Panel     Status: None (Preliminary result)   Collection Time: 08/18/20 12:19 AM   Specimen: BLOOD RIGHT FOREARM  Result Value Ref  Range Status   Specimen Description   Final    BLOOD RIGHT FOREARM Performed at [facility], [place] [address], [place], [place] [address]    Special Requests   Final    BOTTLES DRAWN AEROBIC AND ANAEROBIC Blood Culture adequate volume Performed at [place] [address] [place], [place] [address]    Culture   Final    NO GROWTH 4 DAYS Performed at [facility], [place] [address], [place], [place] [address]    Report Status PENDING  Incomplete  SARS Coronavirus 2 by RT PCR (hospital order, performed in [facility] hospital lab) Nasopharyngeal Nasopharyngeal Swab     Status: None   Collection Time: 08/18/20 12:19 AM   Specimen: Nasopharyngeal Swab  Result Value Ref Range Status   SARS Coronavirus 2 NEGATIVE NEGATIVE Final    Comment: (NOTE) SARS-CoV-2 target nucleic acids are NOT DETECTED.  The SARS-CoV-2 RNA is generally detectable in upper and lower respiratory specimens during the acute phase of infection. The lowest concentration of SARS-CoV-2 viral copies this assay can detect is 250 copies / mL. A negative result does not preclude SARS-CoV-2 infection and should not be used as the sole basis for treatment or other patient management decisions.  A negative result may occur with improper specimen collection / handling, submission of specimen other than nasopharyngeal swab, presence of viral mutation(s) within the areas targeted by this assay, and inadequate number of viral copies (<250 copies / mL). A negative result must be combined with clinical observations, patient history, and epidemiological information.  Fact Sheet for Patients:   StrictlyIdeas.no  Fact Sheet for Healthcare Providers: BankingDealers.co.za  This test is not yet approved or  cleared by the [place] FDA and has been authorized for detection and/or diagnosis of SARS-CoV-2 by FDA under an Emergency Use Authorization  (EUA).  This EUA will remain in effect (meaning this test can be used) for the duration of the COVID-19 declaration under Section 564(b)(1) of the Act, 21 U.S.C. section 360bbb-3(b)(1), unless the authorization is terminated or revoked sooner.  Performed at [facility], [place] [address], [place], [place] [address]   Culture, blood (Routine X 2) w Reflex to ID Panel     Status: None (Preliminary result)   Collection Time: 08/18/20  1:56 AM   Specimen: BLOOD LEFT FOREARM  Result Value Ref Range Status  Specimen Description   Final    BLOOD LEFT FOREARM Performed at [facility], [place] [address] [place], [place] [address]    Special Requests   Final    BOTTLES DRAWN AEROBIC AND ANAEROBIC Blood Culture results may not be optimal due to an inadequate volume of blood received in culture bottles Performed at [place] [address], [place], [place] [address]    Culture   Final    NO GROWTH 4 DAYS Performed at [facility], [place] [address], [place], [place] [address]    Report Status PENDING  Incomplete  Urine Culture     Status: Abnormal   Collection Time: 08/18/20  4:07 AM   Specimen: Urine, Catheterized  Result Value Ref Range Status   Specimen Description   Final    URINE, CATHETERIZED Performed at [facility] [address], [place], [place] [address]    Special Requests   Final    NONE Performed at [place], [place] [address] [place], [place] [address]    Culture (A)  Final    <10,000 COLONIES/mL INSIGNIFICANT GROWTH Performed at [facility] [address] [place], [place] [address]    Report Status 08/19/2020 FINAL  Final  Surgical pcr screen     Status: None   Collection Time: 08/19/20  9:45 AM   Specimen: Nasal Mucosa; Nasal Swab  Result Value Ref Range Status   MRSA, PCR NEGATIVE NEGATIVE Final   Staphylococcus aureus NEGATIVE NEGATIVE Final    Comment:  (NOTE) The Xpert SA Assay (FDA approved for NASAL specimens in patients 64 years of age and older), is one component of a comprehensive surveillance program. It is not intended to diagnose infection nor to guide or monitor treatment. Performed at [facility], [place] [address], [place], [place] [address]   Body fluid culture     Status: None (Preliminary result)   Collection Time: 08/19/20  2:50 PM   Specimen: Body Fluid  Result Value Ref Range Status   Specimen Description FLUID  Final   Special Requests SYNOVIAL RIGHT KNEE  Final   Gram Stain   Final    MODERATE WBC PRESENT, PREDOMINANTLY PMN NO ORGANISMS SEEN    Culture   Final    NO GROWTH 3 DAYS Performed at [facility], [address] [place],  [address]    Report Status PENDING  Incomplete         Radiology Studies: CT ABDOMEN PELVIS WO CONTRAST  Result Date: 08/22/2020 CLINICAL DATA:  Fever of unknown origin EXAM: CT ABDOMEN AND PELVIS WITHOUT CONTRAST TECHNIQUE: Multidetector CT imaging of the abdomen and pelvis was performed following the standard protocol without IV contrast. COMPARISON:  CT 03/31/2020 FINDINGS: Lower chest: Small bilateral pleural effusions. Ground-glass density within the bilateral lower lobes with partial consolidation. Borderline cardiomegaly. Hepatobiliary: No focal liver abnormality is seen. No gallstones, gallbladder wall thickening, or biliary dilatation. Pancreas: Unremarkable. No pancreatic ductal dilatation or surrounding inflammatory changes. Spleen: Normal in size without focal abnormality. Adrenals/Urinary Tract: Nodular thickening of the adrenal glands without dominant mass. Kidneys show no hydronephrosis. Cortical scarring in the mid left kidney. The urinary bladder is unremarkable. Stomach/Bowel: The stomach is nonenlarged. No dilated small bowel. No bowel wall thickening. Negative appendix. Large amount of stool in the colon. Vascular/Lymphatic: Moderate aortic  atherosclerosis without aneurysm. No suspicious nodes. Reproductive: Lobulated uterus with multiple fibroids some of which are calcified. 1.8  cm benign-appearing left adnexal cyst without change, no further workup recommended. Other: Negative for free air or free fluid. Fat containing umbilical hernia. Musculoskeletal: No acute osseous abnormality. Post laminectomy changes L2-L3 through L3-L4. Degenerative changes at multiple levels. IMPRESSION: 1. No CT evidence for acute intra-abdominal or pelvic abnormality. 2. Small bilateral pleural effusions. Partial consolidations within the bilateral lower lobes which may reflect atelectasis or pneumonia. Ground-glass density in the bilateral lower lobes which may be secondary to infection or edema. 3. Fibroid uterus. 4. Fat containing umbilical hernia. Aortic Atherosclerosis (ICD10-I70.0). Electronically Signed   By: Donavan Foil M.D.   On: 08/22/2020 02:03   DG Chest 1 View  Result Date: 08/22/2020 CLINICAL DATA:  Hypoxia EXAM: CHEST  1 VIEW COMPARISON:  08/18/2020 FINDINGS: Marked worsening of bilateral airspace opacities in a pattern most suggestive of pulmonary edema. No pneumothorax or pleural effusion. Mild cardiomegaly. IMPRESSION: Marked worsening of bilateral airspace opacities, most suggestive of pulmonary edema. Electronically Signed   By: Ulyses Jarred M.D.   On: 08/22/2020 02:00        Scheduled Meds: . carvedilol  12.5 mg Oral BID WC  . cholecalciferol  3,000 Units Oral Daily  . cloNIDine  0.1 mg Transdermal Weekly  . colchicine  0.3 mg Oral Daily  . diclofenac Sodium  2 g Topical QID  . furosemide  20 mg Intravenous Once  . furosemide  40 mg Intravenous Q8H  . heparin injection (subcutaneous)  5,000 Units Subcutaneous Q12H  . losartan  25 mg Oral Daily  . pantoprazole  40 mg Oral BID  . polyethylene glycol  17 g Oral BID  . potassium chloride  20 mEq Oral Once  . rosuvastatin  20 mg Oral QHS  . senna-docusate  1 tablet Oral BID  .  sodium chloride flush  10-40 mL Intracatheter Q12H  . sucralfate  1 g Oral TID WC & HS   Continuous Infusions: . ceFEPime (MAXIPIME) IV 2 g (08/22/20 0239)  . methocarbamol (ROBAXIN) IV       LOS: 4 days    Time spent:35 minutes    Elmarie Shiley, MD [facility]   If 7PM-7AM, please contact night-coverage [email or web address]  08/22/2020, 11:21 AM

## 2020-08-22 NOTE — Progress Notes (Addendum)
HOSPITAL MEDICINE OVERNIGHT EVENT NOTE    Called and notified by nursing that patient has suddenly developed shortness of breath, rather rapid in onset.  Patient denies associated chest pain but does complain of abdominal pain for which there is a pending CT scan of the abdomen and pelvis.  Chart reviewed, patient undergoing extensive work-up for possible infectious process.  Vital signs are stable.  Nursing reports that patient has been intermittently tachycardic over the past several hours since her shortness of breath began.  Obtaining EKG, chest x-ray, placing patient on telemetry.  Obtaining BNP, troponin and D-dimer.  Will monitor closely.  Vernelle Emerald  MD [facility]   ADDENDUM  CXR suggestive of acute pulmonary edema.  IVF discontinued.  Lasix 40mg  IV x 1 ordered.  Monitoring for clinical response.  BNP ordered with AM labs.  Troponin and DDimer cancelled.  Consider echo for the AM.  Vernelle Emerald

## 2020-08-22 NOTE — Progress Notes (Signed)
I was called by nurse, patient develops worsening Hypoxemia, had transient episode of chest pain.   1-Worsening acute Hypoxic Respiratory Failure, transient episode of chest pain:  -Now on NB mask.  -Will give extra dose of IV lasix.  -BIPAP as needed.  -CCM consulted. Cardiology updated.  -Daughter Updated.  -cycle enzymes.   Niel Hummer, MD.

## 2020-08-22 NOTE — Progress Notes (Signed)
Pt. Complain of heartburn @ 0755. Pt. Reported eating helps with heartburn. Pt. Report heartburn has occurred in the past and @ admission. Advise pt to continue to eat thin fluid breakfast and reassess at 0830.   Reassess @ 0830, pt. Still had heartburn. PRN Maalox was given.

## 2020-08-22 NOTE — Consult Note (Signed)
NAME:  Kristin [name], MRN:  [id number], DOB:  08/13/1941, LOS: 4 ADMISSION DATE:  08/18/2020, CONSULTATION DATE:  08/22/20 REFERRING MD:  Tyrell Antonio  CHIEF COMPLAINT:  Dyspnea    History of present illness   Kristin [name] is a 79 y.o. female who has a PMH as outlined below.  She presented to [facility] ED  9/10 for LE weakness and low back pain 2/2 lumbar stenosis.  On 9/11, she was taken to OR for L2 - L4 laminectomy. 9/13 she had dyspnea and chest pain and was found to have NSTEMI and A.fib.  She was started on heparin without bolus by cardiology (after consulting with neurosurgery). 9/14 she had hypoxia with increased O2 requirements.  PCCM subsequently asked to see in consultation.  Due to symptoms likely being 2/2 pulmonary edema, she was given additional diuresis and started on nitro.  Past Medical History  has SIRS (systemic inflammatory response syndrome) ([place]); Lower extremity weakness; Hypertension; Chronic low back pain with bilateral sciatica; Effusion of right knee joint; NSTEMI (non-ST elevated myocardial infarction) ([place]); Atrial fibrillation ([place]); Mixed hyperlipidemia; and Elevated troponin on their problem list.  Significant Hospital Events   9/10 > admit. 9/11 > R knee aspiration by ortho with 80 - 90cc fluid removed, felt to be 2/2 inflammatory process. 9/11 > L2 - L4 laminectomy. 9/14 > hypoxia, placed on NRB, started on nitro and given more lasix (net +4L).  Consults:  Neurosurgery, Cards, PCCM.  Procedures:  None.  Significant Diagnostic Tests:  CT A/ P 9/13 > small effusions, atelectasis, fibroid uterus, fat containing umbilical hernia. Echo 9/14 > EF 30-35%.  Micro Data:  Blood 9/10 >  R knee 9/11 >  Urine 9/10 >  COVID 9/10 > neg.  Antimicrobials:  Cefepime 9/9 >    Interim history/subjective:  Comfortable on NRB.  Can likely be decreased back to [place].  Objective:  Blood pressure (!) 138/104, pulse 76, temperature 98.1 F (36.7 C), temperature source  Oral, resp. rate 17, height 5\' 4"  (1.626 m), weight 93.3 kg, SpO2 92 %.        Intake/Output Summary (Last 24 hours) at 08/22/2020 1741 Last data filed at 08/22/2020 1500 Gross per 24 hour  Intake 3870.07 ml  Output 1775 ml  Net 2095.07 ml   Filed Weights   08/18/20 2354 08/20/20 0436  Weight: 89.2 kg 93.3 kg    Examination: General: Adult female, frail, in NAD. Neuro: A&O x 3, no deficits. HEENT: [place]/AT. Sclerae anicteric.  EOMI. Cardiovascular: RRR, no M/R/G.  Lungs: Respirations even and unlabored.  Crackles bilaterally. Abdomen: BS x 4, soft, NT/ND.  Musculoskeletal: No gross deformities, no edema.  Skin: Intact, warm, no rashes.  Assessment & Plan:   Acute hypoxic respiratory failure - presumed 2/2 acute pulmonary edema in setting A.fib, NSTEMI, new sCHF / cardiomyopathy. - Agree with cards regarding additional diuresis and nitro gtt trial. - Goal even to neg balance gradually. - De-escalate from NRB back to [place], goal SpO2 > 92%. - Upright positioning while in bed (HOB > 30), ambulate as able. - Rate control per cards. - Continue heparin gtt. - LHC once cleared by NSGY/ cards. - CXR intermittently.  SIRS - unclear etiology, likely multifactorial.  No clear indication of infection at this point. - Consider de-escalating abx. - Check PCT and if low, recommend d/c abx.  Lumbar stenosis - s/p L2 - L4 laminectomy. - Per NSGY.   Continue pulmonary edema management per cards.  Rest per primary team.   Nothing  further to add.  PCCM will sign off.  Please do not hesitate to call us back if we can be of any further assistance.   Labs   CBC: Recent Labs  Lab 08/18/20 0200 08/19/20 2201 08/20/20 1429 08/21/20 0237 08/22/20 1145  WBC 10.8* 13.7* 15.1* 14.5* 18.7*  NEUTROABS 7.0  --   --   --   --   HGB 11.7* 10.4* 8.9* 9.0* 9.5*  HCT 36.9 32.0* 28.2* 28.3* 30.2*  MCV 93.2 90.9 91.3 90.7 90.1  PLT 291 287 259 303 371   Basic Metabolic Panel: Recent Labs  Lab  08/18/20 0200 08/19/20 2201 08/20/20 1429 08/21/20 0237 08/22/20 0811  NA 137  --  129* 134* 132*  K 4.0  --  3.8 4.2 3.9  CL 101  --  94* 98 97*  CO2 25  --  23 25 24   GLUCOSE 138*  --  194* 162* 162*  BUN 26*  --  35* 35* 30*  CREATININE 0.99 1.24* 1.62* 1.30* 1.06*  CALCIUM 9.6  --  8.5* 8.9 8.8*   GFR: Estimated Creatinine Clearance: 46.8 mL/min (A) (by C-G formula based on SCr of 1.06 mg/dL (H)). Recent Labs  Lab 08/18/20 0200 08/18/20 0200 08/19/20 2201 08/20/20 1429 08/21/20 0237 08/22/20 1145  PROCALCITON  --   --   --   --   --  0.69  WBC 10.8*   < > 13.7* 15.1* 14.5* 18.7*  LATICACIDVEN 1.0  --   --   --   --   --    < > = values in this interval not displayed.   Liver Function Tests: Recent Labs  Lab 08/18/20 0200  AST 19  ALT 16  ALKPHOS 79  BILITOT 0.4  PROT 8.0  ALBUMIN 3.9   No results for input(s): LIPASE, AMYLASE in the last 168 hours. No results for input(s): AMMONIA in the last 168 hours. ABG    Component Value Date/Time   PHART 7.460 (H) 08/22/2020 1613   PCO2ART 35.0 08/22/2020 1613   PO2ART 137 (H) 08/22/2020 1613   HCO3 24.6 08/22/2020 1613   O2SAT 98.3 08/22/2020 1613    Coagulation Profile: Recent Labs  Lab 08/18/20 1445  INR 1.1   Cardiac Enzymes: Recent Labs  Lab 08/18/20 0200  CKTOTAL 54   HbA1C: No results found for: HGBA1C CBG: Recent Labs  Lab 08/20/20 0007 08/20/20 0432 08/20/20 1149 08/22/20 1142 08/22/20 1600  GLUCAP 151* 140* 126* 168* 192*    Review of Systems:   All negative; except for those that are bolded, which indicate positives.  Constitutional: weight loss, weight gain, night sweats, fevers, chills, fatigue, weakness.  HEENT: headaches, sore throat, sneezing, nasal congestion, post nasal drip, difficulty swallowing, tooth/dental problems, visual complaints, visual changes, ear aches. Neuro: difficulty with speech, weakness, numbness, ataxia. CV:  chest pain, orthopnea, PND, swelling in lower  extremities, dizziness, palpitations, syncope.  Resp: cough, hemoptysis, dyspnea, wheezing. GI: heartburn, indigestion, abdominal pain, nausea, vomiting, diarrhea, constipation, change in bowel habits, loss of appetite, hematemesis, melena, hematochezia.  GU: dysuria, change in color of urine, urgency or frequency, flank pain, hematuria. MSK: joint pain or swelling, decreased range of motion. Psych: change in mood or affect, depression, anxiety, suicidal ideations, homicidal ideations. Skin: rash, itching, bruising.   Past medical history  She,  has a past medical history of Diabetes mellitus without complication ([place]), Hypercholesteremia, Hypertension, and Renal disorder.   Surgical History    Past Surgical History:  Procedure Laterality  Date  . LUMBAR LAMINECTOMY/DECOMPRESSION MICRODISCECTOMY N/A 08/19/2020   Procedure: LUMBAR LAMINECTOMY/DECOMPRESSION  Lumbar two-three, Lumbar three-four;  Surgeon: Dawley, Theodoro Doing, DO;  Location: [facility];  Service: Neurosurgery;  Laterality: N/A;     Social History   reports that she has never smoked. She has never used smokeless tobacco. She reports that she does not drink alcohol and does not use drugs.   Family history   Her family history includes Diabetes in her father.   Allergies No Known Allergies   Home meds  Prior to Admission medications   Medication Sig Start Date End Date Taking? Authorizing Provider  amLODipine (NORVASC) 10 MG tablet Take 1 tablet (10 mg total) by mouth daily. 03/16/20  Yes Nicolette Bang, DO  carvedilol (COREG) 12.5 MG tablet Take 1 tablet (12.5 mg total) by mouth 2 (two) times daily with a meal. 03/16/20  Yes Nicolette Bang, DO  cholecalciferol (VITAMIN D) 25 MCG (1000 UNIT) tablet Take 3,000 Units by mouth daily.   Yes [provider]  hydrALAZINE (APRESOLINE) 25 MG tablet Take 25 mg by mouth in the morning and at bedtime.   Yes [provider]  losartan (COZAAR) 25 MG tablet  Take 25 mg by mouth daily. 08/10/20  Yes [provider]  oxyCODONE-acetaminophen (PERCOCET/ROXICET) 5-325 MG tablet Take 1 tablet by mouth 2 (two) times daily as needed for severe pain.   Yes [provider]  rosuvastatin (CRESTOR) 20 MG tablet Take 1 tablet (20 mg total) by mouth at bedtime. 03/16/20  Yes Nicolette Bang, DO  cloNIDine (CATAPRES) 0.1 MG tablet Take 1 tablet (0.1 mg total) by mouth 2 (two) times daily. Patient not taking: Reported on 08/18/2020 03/16/20   Nicolette Bang, DO  HYDROcodone-acetaminophen (NORCO/VICODIN) 5-325 MG tablet Take 1-2 tablets by mouth every 6 (six) hours as needed. Patient not taking: Reported on 08/18/2020 03/31/20   Veryl Speak, MD  Misc. Devices MISC Please dispense one power stair lift for at home daily use prn debility. 03/15/20   Nicolette Bang, DO  predniSONE (DELTASONE) 10 MG tablet Take 2 tablets (20 mg total) by mouth 2 (two) times daily with a meal. Patient not taking: Reported on 08/18/2020 03/31/20   Veryl Speak, MD  traZODone (DESYREL) 50 MG tablet Take 0.5-1 tablets (25-50 mg total) by mouth at bedtime as needed for sleep. Patient not taking: Reported on 03/31/2020 03/09/20   Nicolette Bang, DO     Montey Hora, [facility] 08/22/2020, 5:41 PM

## 2020-08-22 NOTE — Progress Notes (Signed)
Received a call from cardiology, Dr. Terri Skains with findings of an elevated troponin and EKG changes.  He connected me with his partner Dr. Einar Gip, the cardio interventionalists.  We discussed anticoagulation with balancing the risk of hematoma development and possible neurologic deficit from this.  We are planning on using a heparin drip with no bolus and a PTT goal of 50-60.  She will be monitored neurologically and if has signs of a developing hematoma we will then have to stop the heparin.  Ultimately, depending on her heart catheterization findings/treatment she may need antiplatelet or dual antiplatelet therapy.  We will discuss options once work-up is complete.  During this time, we can trend her cardiac enzymes and continue her cardiac work-up.  Thank you for allowing me to participate in this patient's care.  Please do not hesitate to call with questions or concerns.   Elwin Sleight, [facility] Cell: [phone]

## 2020-08-22 NOTE — Progress Notes (Signed)
ANTICOAGULATION CONSULT NOTE - Initial Consult  Pharmacy Consult for Heparin Indication: ACS  No Known Allergies  Patient Measurements: Height: 5\' 4"  (162.6 cm) Weight: 93.3 kg (205 lb 11 oz) IBW/kg (Calculated) : 54.7 Heparin Dosing Weight: 93.3  Vital Signs: Temp: 98.1 F (36.7 C) (09/14 1141) Temp Source: Oral (09/14 1141) BP: 138/104 (09/14 1141) Pulse Rate: 76 (09/14 1141)  Labs: Recent Labs    08/20/20 1429 08/20/20 1429 08/21/20 0237 08/22/20 0811 08/22/20 1145  HGB 8.9*   < > 9.0*  --  9.5*  HCT 28.2*  --  28.3*  --  30.2*  PLT 259  --  303  --  372  CREATININE 1.62*  --  1.30* 1.06*  --   TROPONINIHS  --   --   --   --  >27,000*   < > = values in this interval not displayed.    Estimated Creatinine Clearance: 46.8 mL/min (A) (by C-G formula based on SCr of 1.06 mg/dL (H)).   Medical History: Past Medical History:  Diagnosis Date  . Diabetes mellitus without complication ([place])   . Hypercholesteremia   . Hypertension   . Renal disorder    CKD Stage IV   Assessment: 79 yo female with new onset ACS per telephone conversation with Dr. Terri Skains of Cardiology. Patient has had a recent Neurosurgical procedure, therefore Neurosurgery is requesting no boluses and a tight APTT goal of 50-60. Last sq heparin dose 1025.   Goal of Therapy:  APTT 50-60 per neurosurgery Monitor platelets by anticoagulation protocol: Yes   Plan:  D/C sq heparin Heparin drip at 1150 units/hr (~12 units/kg/hr) APTT in 8 hours No bolus Daily APTT/CBC Monitor for bleeding  Tamya Denardo A. Levada Dy, PharmD, BCPS, FNKF Clinical Pharmacist [place] Please utilize Amion for appropriate phone number to reach the unit pharmacist ([place])   08/22/2020,2:07 PM

## 2020-08-22 NOTE — Progress Notes (Signed)
Pharmacy Antibiotic Note  Kristin [name] is a 79 y.o. female admitted on 08/18/2020 with possible phlegmon lumbar spine, concern for epidural abscess vs septic arthritis of bilateral knees. These were ruled out.  Pharmacy has been consulted for Cefepime dosing.    Vancomycin, Cefepime and Metronidazole given in ED at [facility] on 9/10 at 6-7am.   Patient currently day 5 of cefepime    Plan:  Continue Cefepime 2gm IV q12hrs  Follow renal function, culture data, clinical progress. F/U length of therapy for abx with current infectious w/u negative.   Height: 5\' 4"  (162.6 cm) Weight: 93.3 kg (205 lb 11 oz) IBW/kg (Calculated) : 54.7  Temp (24hrs), Avg:98.1 F (36.7 C), Min:97.6 F (36.4 C), Max:99 F (37.2 C)  Recent Labs  Lab 08/18/20 0200 08/19/20 2201 08/20/20 1429 08/21/20 0237  WBC 10.8* 13.7* 15.1* 14.5*  CREATININE 0.99 1.24* 1.62* 1.30*  LATICACIDVEN 1.0  --   --   --     Estimated Creatinine Clearance: 38.2 mL/min (A) (by C-G formula based on SCr of 1.3 mg/dL (H)).    No Known Allergies  Antimicrobials this admission:  Vancomycin 9/10 >>9/13  Cefepime 9/10 >>  Metronidazole x 1 on 9/10  Dose adjustments this admission:  n/a  Microbiology results:  9/10 urine: < 10K/ml, insignificant growth  9/10 blood x 2: pending  9/10 COVID: negative  Pradeep Beaubrun A. Levada Dy, PharmD, BCPS, FNKF Clinical Pharmacist [place] Please utilize Amion for appropriate phone number to reach the unit pharmacist ([place])  08/22/2020 8:30 AM

## 2020-08-22 NOTE — Progress Notes (Signed)
Cardiology on-call: Received page at 4:38 PM. Saw the patient at 4:45 PM  Was called by the patient's nurse stating that she was requiring more oxygen.  She was 80% on 5 to 6 L nasal cannula.  The increase in nasal cannula to 7 L and her oxygen saturations were 91%.   Rapid response called.  At the time of the evaluation patient denied any chest pain.  Currently on a nonrebreather.  PHYSICAL EXAM: Vitals with BMI 08/22/2020 08/22/2020 08/22/2020  Height - - -  Weight - - -  BMI - - -  Systolic 967 893 810  Diastolic 175 94 102  Pulse 76 93 100    CONSTITUTIONAL: Age-appropriate female, sitting upright using nonrebreather, hemodynamically stable. CHEST Normal respiratory effort. No intercostal retractions  LUNGS: Mild crackles noted at the bases.  Currently on nonrebreather.  CARDIOVASCULAR: Irregularly irregular, positive H8-N2, soft holosystolic murmur at the apex, no gallops or rubs ABDOMINAL: Obese, soft, nontender, nondistended, positive bowel sounds in all 4 quadrants, no apparent ascites.  EXTREMITIES: No peripheral edema  HEMATOLOGIC: No significant bruising NEUROLOGIC: Oriented to person, place, and time. Nonfocal. Normal muscle tone.  PSYCHIATRIC: Normal mood and affect. Normal behavior. Cooperative  ECHO COMPLETE WITH IMAGING ENHANCING AGENT 08/22/2020 1. Left ventricular ejection fraction, by estimation, is 30 to 35%. The left ventricle has moderately decreased function. The left ventricle demonstrates regional wall motion abnormalities. Hyokinesis of base to mid anterior wall, base to mid anterospetal wall, apical septal, and apex. There is moderate left ventricular hypertrophy. Diastolic function is not assessed due to atrial fibrillation. No apparent left ventricular thrombus. 2. Right ventricular systolic function is normal. The right ventricular size is normal. There is moderately elevated pulmonary artery systolic pressure. The estimated right ventricular systolic  pressure is 77.8 mmHg. 3. The mitral valve is normal in structure. Mild mitral valve regurgitation. No evidence of mitral stenosis. 4. The aortic valve is normal in structure. Aortic valve regurgitation is not visualized. Mild aortic valve sclerosis is present, with no evidence of aortic valve stenosis. 5. The inferior vena cava is dilated in size with >50% respiratory variability, suggesting right atrial pressure of 8 mmHg.  Impression & recommendations: Non-STEMI Pulmonary edema secondary to non-STEMI and A. Fib Acute heart failure with reduced EF  Cardiomyopathy, unspecified Newly discovered atrial fibrillation Recent laminectomy L2-L4  Start nitro drip to help with pulmonary edema. We will start amnio drip to convert to normal sinus rhythm.  Check TSH, AST ALT. Patient has been started on IV heparin with goal PTT between 50-60 per neurosurgery recommendations. Echocardiogram results reviewed. Patient has been transitioned to Lasix 40 mg IV push 3 times daily. Continue with beta-blocker therapy at home dose to help with a ventricular rate. Check CMP, mag, BNP Check portable x-ray. Pulmonary recommendations pending Will need further guidance from neurosurgery when it would be an optimal time for left heart catheterization as it will require heparin and possible dual antiplatelet therapy if coronary intervention is needed.  Plan of care discussed with pulmonary team, interventional cardiology, and nursing staff.  Also called her daughter Rema Fendt at [phone]  We will follow the patient with you.  CRITICAL CARE Performed by: Rex Kras   Total critical care time: 35 minutes   Critical care time was exclusive of separately billable procedures and treating other patients.   Critical care was necessary to treat or prevent imminent or life-threatening deterioration.   Critical care was time spent personally by me on the following activities: development of  treatment plan with  patient and/or surrogate as well as nursing, discussions with consultants, evaluation of patient's response to treatment, examination of patient, obtaining history from patient or surrogate, ordering and performing treatments and interventions, ordering and review of laboratory studies, ordering and review of radiographic studies, pulse oximetry and re-evaluation of patient's condition.   Rex Kras, [place], [facility]  Pager: [phone] Office: [phone]

## 2020-08-22 NOTE — Progress Notes (Signed)
   Providing Compassionate, Quality Care - Together  NEUROSURGERY PROGRESS NOTE   S: heartburn/abd pain ON, workup shows pulm edema, hospitalist managing  O: EXAM:  BP (!) 141/94 (BP Location: Right Arm)   Pulse 93   Temp 97.6 F (36.4 C) (Oral)   Resp 17   Ht 5\' 4"  (1.626 m)   Wt 93.3 kg   SpO2 91%   BMI 35.31 kg/m   Awake, alert, oriented  Speech fluent, appropriate  CNs grossly intact  5/5 BUE/BLE  Incision c/d/i  ASSESSMENT: 79 y.o.femalewith  1.L2-L4 severe lumbar stenosis 2.Lower extremity pain and weakness  S/p L2-4 lami on 08/19/2020  PLAN: -pt/ot - pain control - gout per ortho/primary - improving appropriately, continue therapy, ok to dc from my standpoint when medically cleared - daughter updated via phone    Thank you for allowing me to participate in this patient's care.  Please do not hesitate to call with questions or concerns.   Elwin Sleight, [facility] Cell: [phone]

## 2020-08-22 NOTE — Progress Notes (Signed)
PT Cancellation Note  Patient Details Name: Kristin [name] MRN: [id number] DOB: Nov 19, 1941   Cancelled Treatment:    Reason Eval/Treat Not Completed: (P) Medical issues which prohibited therapy Rapid response called for lethargy and hypoxia. PT will follow back tomorrow.  Nikita Humble B. Migdalia Dk PT, DPT Acute Rehabilitation Services Pager [phone] Office [phone]    [place] 08/22/2020, 4:01 PM

## 2020-08-22 NOTE — Progress Notes (Signed)
Received pt from 5MW. Patient apparently looked pale. Slow to respond to verbal stimuli. CBg  192,  Oxygen saturation at 89% on 5 L . Raised to 7-8 liters but o2 saturation has remained at 90-91 %. RRT and RT called for assist. EKG done; ABG ordered. Dr. Tyrell Antonio called and updated. Cardiology called and updated. New orders received . Will continue to monitor.

## 2020-08-22 NOTE — TOC Initial Note (Signed)
Transition of Care [facility]) - Initial/Assessment Note    Patient Details  Name: Kristin [name] MRN: [id number] Date of Birth: January 30, 1941  Transition of Care [facility]) CM/SW Contact:    Bartholomew Crews, RN Phone Number: [phone] 08/22/2020, 5:08 PM  Clinical Narrative:                  Spoke with patient earlier this afternoon. PTA home with daughter. Has a cane and a walker. No home health services. She is agreeable to [facility] PT/[facility] per recommendations. [facility] referral. [place] orders already placed.   Noted patient on oxygen. Does not have oxygen at home. Will follow for possible home oxygen.   Patient has now been transferred to progressive department.   TOC following for transition needs.   Expected Discharge Plan: [place] Barriers to Discharge: Continued Medical Work up   Patient Goals and CMS Choice Patient states their goals for this hospitalization and ongoing recovery are:: return home with her daughter CMS Medicare.gov Compare Post Acute Care list provided to:: Patient Choice offered to / list presented to : Patient  Expected Discharge Plan and Services Expected Discharge Plan: [place]   Discharge Planning Services: CM Consult Post Acute Care Choice: [place] arrangements for the past 2 months: Single Family Home                           HH Arranged: PT, OT [place] Agency: [facility] (formerly [employer]) Date [place]: 08/22/20 Time [place]: [id number] Representative spoke with at [facility]: [place]  Prior Living Arrangements/Services Living arrangements for the past 2 months: [place] with:: Self, Adult Children Patient language and need for interpreter reviewed:: Yes        Need for Family Participation in Patient Care: Yes (Comment) Care giver support system in place?: Yes (comment) Current home services: DME (cane, walker) Criminal Activity/Legal  Involvement Pertinent to Current Situation/Hospitalization: No - Comment as needed  Activities of Daily Living Home Assistive Devices/Equipment: Cane (specify quad or straight), Eyeglasses, Scales, Walker (specify type), Wheelchair ADL Screening (condition at time of admission) Patient's cognitive ability adequate to safely complete daily activities?: Yes Is the patient deaf or have difficulty hearing?: No Does the patient have difficulty seeing, even when wearing glasses/contacts?: No Does the patient have difficulty concentrating, remembering, or making decisions?: No Patient able to express need for assistance with ADLs?: Yes Does the patient have difficulty dressing or bathing?: Yes Independently performs ADLs?: No Communication: Independent Dressing (OT): Needs assistance Is this a change from baseline?: Change from baseline, expected to last <3days Grooming: Needs assistance Is this a change from baseline?: Change from baseline, expected to last <3 days Feeding: Independent Bathing: Needs assistance Is this a change from baseline?: Change from baseline, expected to last <3 days Toileting: Needs assistance Is this a change from baseline?: Change from baseline, expected to last <3 days In/Out Bed: Dependent Is this a change from baseline?: Change from baseline, expected to last <3 days Walks in Home: Needs assistance Is this a change from baseline?: Change from baseline, expected to last <3 days Does the patient have difficulty walking or climbing stairs?: Yes Weakness of Legs: Both Weakness of Arms/Hands: None  Permission Sought/Granted                  Emotional Assessment Appearance:: Appears stated age Attitude/Demeanor/Rapport: Engaged  Affect (typically observed): Accepting Orientation: : Oriented to Self, Oriented to  Time, Oriented to Place, Oriented to Situation Alcohol / Substance Use: Not Applicable Psych Involvement: No (comment)  Admission diagnosis:   SIRS (systemic inflammatory response syndrome) (HCC) [R65.10] Lower extremity weakness [R29.898] Fever, unspecified fever cause [R50.9] Patient Active Problem List   Diagnosis Date Noted  . NSTEMI (non-ST elevated myocardial infarction) ([place])   . Atrial fibrillation ([place])   . Mixed hyperlipidemia   . Elevated troponin   . Effusion of right knee joint   . SIRS (systemic inflammatory response syndrome) ([place]) 08/18/2020  . Lower extremity weakness 08/18/2020  . Hypertension   . Chronic low back pain with bilateral sciatica    PCP:  Caren Macadam, MD Pharmacy:   [facility] [address] [place]), [place] - [address] [place] [address] Phone: [phone] Fax: [phone]     Social Determinants of Health (SDOH) Interventions    Readmission Risk Interventions No flowsheet data found.

## 2020-08-22 NOTE — Consult Note (Signed)
CARDIOLOGY CONSULT NOTE  Patient ID: Kristin [name] MRN: [id number] DOB/AGE: Apr 03, 1941 79 y.o.  Admit date: 08/18/2020 Attending physician: Elmarie Shiley, MD Primary Physician:  Caren Macadam, MD Outpatient Cardiologist: NA Inpatient Cardiologist: Rex Kras, DO, [facility]  Chief complaint: Atrial fibrillation  HPI:  Kristin [name] is a 79 y.o. African-American female who presents with a chief complaint of " presents with bilateral lower extremity weakness and lower back pain." Her past medical history and cardiovascular risk factors include: Lumbar stenosis status post laminectomy, hypertension, CKD, diabetes, postmenopausal female, advanced age.   Since hospitalization patient had undergone an MRI which noted lumbar stenosis and underwent L2-L4 laminectomy on 08/22/2020.  Patient is also being treated with broad-spectrum antibiotics for underlying SIRS as she presented with fever, tachycardia as per electronic medical records.  Overnight patient had developed symptoms of shortness of breath relatively rapid in onset.  No chest pain at that time per EMR.  She also underwent EKG, chest x-ray, BNP.  Troponin and D-dimers were canceled.  Chest x-ray noted pulmonary edema and EKG notable for atrial fibrillation.  Cardiology consulted for management of atrial fibrillation and pulmonary edema.  Echocardiogram is currently pending.  Prior to seeing the patient her laboratory blood work from this morning came back and high-sensitivity troponins were greater than 27,000.  On evaluation patient states that she has been having epigastric discomfort for the last 1 week.  She is attributing the discomfort to heartburn as the pain would get better after eating.  But the pain is at times gets worse with walking and better with eating and resting.  She did not think much of it and was treating it as heartburn-like symptoms.  Patient is resting in bed comfortably and accompanied by her daughter Kristin [name] at  bedsides.  Currently patient has mild epigastric discomfort 5 out of 10 which is better than last week according to the patient.  Stat EKG was performed at 1332 shows atrial fibrillation, 93 bpm, poor R wave progression, and possible anteroseptal injury pattern. Poor R wave progression and ST changes are new compared to EKG 08/18/2020.  Stat echocardiogram ordered.  ALLERGIES: No Known Allergies  PAST MEDICAL HISTORY: Diabetes mellitus. Hypertension. Hyperlipidemia. Chronic kidney disease  PAST SURGICAL HISTORY: Past Surgical History:  Procedure Laterality Date  . LUMBAR LAMINECTOMY/DECOMPRESSION MICRODISCECTOMY N/A 08/19/2020   Procedure: LUMBAR LAMINECTOMY/DECOMPRESSION  Lumbar two-three, Lumbar three-four;  Surgeon: Dawley, Theodoro Doing, DO;  Location: [place];  Service: Neurosurgery;  Laterality: N/A;    FAMILY HISTORY: The patient family history includes Diabetes in her father.   SOCIAL HISTORY:  The patient  reports that she has never smoked. She has never used smokeless tobacco. She reports that she does not drink alcohol and does not use drugs.  MEDICATIONS: Current Outpatient Medications  Medication Instructions  . amLODipine (NORVASC) 10 mg, Oral, Daily  . carvedilol (COREG) 12.5 mg, Oral, 2 times daily with meals  . cholecalciferol (VITAMIN D) 3,000 Units, Oral, Daily  . cloNIDine (CATAPRES) 0.1 mg, Oral, 2 times daily  . hydrALAZINE (APRESOLINE) 25 mg, Oral, 2 times daily  . HYDROcodone-acetaminophen (NORCO/VICODIN) 5-325 MG tablet 1-2 tablets, Oral, Every 6 hours PRN  . losartan (COZAAR) 25 mg, Oral, Daily  . Misc. Devices MISC Please dispense one power stair lift for at home daily use prn debility.  Marland Kitchen oxyCODONE-acetaminophen (PERCOCET/ROXICET) 5-325 MG tablet 1 tablet, Oral, 2 times daily PRN  . predniSONE (DELTASONE) 20 mg, Oral, 2 times daily with meals  . rosuvastatin (CRESTOR) 20 mg, Oral,  Daily at bedtime  . traZODone (DESYREL) 25-50 mg, Oral, At bedtime PRN     14 ORGAN REVIEW OF SYSTEMS: Review of Systems  Constitutional: Negative for chills and fever.  HENT: Negative for hoarse voice and nosebleeds.   Eyes: Negative for discharge, double vision and pain.  Cardiovascular: Positive for chest pain (epigastric pain) and dyspnea on exertion. Negative for claudication, leg swelling, near-syncope, orthopnea, palpitations, paroxysmal nocturnal dyspnea and syncope.  Respiratory: Positive for shortness of breath. Negative for hemoptysis.   Musculoskeletal: Negative for muscle cramps and myalgias.  Gastrointestinal: Negative for abdominal pain, constipation, diarrhea, hematemesis, hematochezia, melena, nausea and vomiting.  Neurological: Negative for dizziness and light-headedness.    PHYSICAL EXAM: Vitals with BMI 08/22/2020 08/22/2020 08/22/2020  Height - - -  Weight - - -  BMI - - -  Systolic 403 474 259  Diastolic 563 94 875  Pulse 76 93 100     Intake/Output Summary (Last 24 hours) at 08/22/2020 1317 Last data filed at 08/22/2020 1300 Gross per 24 hour  Intake 4090.07 ml  Output 1300 ml  Net 2790.07 ml    Net IO Since Admission: 5,080.46 mL [08/22/20 1317] Last Weight  Most recent update: 08/20/2020  4:37 AM   Weight  93.3 kg (205 lb 11 oz)           CONSTITUTIONAL: Well-developed and well-nourished. No acute distress.  Hemodynamically stable. SKIN: Skin is warm and dry. No rash noted. No cyanosis. No pallor. No jaundice HEAD: Normocephalic and atraumatic.  EYES: No scleral icterus MOUTH/THROAT: Moist oral membranes.  NECK: No JVD present. No thyromegaly noted. No carotid bruits  LYMPHATIC: No visible cervical adenopathy.  CHEST Normal respiratory effort. No intercostal retractions  LUNGS: Clear to auscultation with rales at the bases. CARDIOVASCULAR: Irregularly irregular, variable I4-P3, soft holosystolic murmur heard at the apex, no gallops or rubs. ABDOMINAL: Obese, soft, nontender, nondistended, positive bowel sounds in all 4  quadrants no apparent ascites.  EXTREMITIES: No peripheral edema  HEMATOLOGIC: No significant bruising NEUROLOGIC: Oriented to person, place, and time. Nonfocal. Normal muscle tone.  PSYCHIATRIC: Normal mood and affect. Normal behavior. Cooperative  RADIOLOGY: CT ABDOMEN PELVIS WO CONTRAST  Result Date: 08/22/2020 CLINICAL DATA:  Fever of unknown origin EXAM: CT ABDOMEN AND PELVIS WITHOUT CONTRAST TECHNIQUE: Multidetector CT imaging of the abdomen and pelvis was performed following the standard protocol without IV contrast. COMPARISON:  CT 03/31/2020 FINDINGS: Lower chest: Small bilateral pleural effusions. Ground-glass density within the bilateral lower lobes with partial consolidation. Borderline cardiomegaly. Hepatobiliary: No focal liver abnormality is seen. No gallstones, gallbladder wall thickening, or biliary dilatation. Pancreas: Unremarkable. No pancreatic ductal dilatation or surrounding inflammatory changes. Spleen: Normal in size without focal abnormality. Adrenals/Urinary Tract: Nodular thickening of the adrenal glands without dominant mass. Kidneys show no hydronephrosis. Cortical scarring in the mid left kidney. The urinary bladder is unremarkable. Stomach/Bowel: The stomach is nonenlarged. No dilated small bowel. No bowel wall thickening. Negative appendix. Large amount of stool in the colon. Vascular/Lymphatic: Moderate aortic atherosclerosis without aneurysm. No suspicious nodes. Reproductive: Lobulated uterus with multiple fibroids some of which are calcified. 1.8 cm benign-appearing left adnexal cyst without change, no further workup recommended. Other: Negative for free air or free fluid. Fat containing umbilical hernia. Musculoskeletal: No acute osseous abnormality. Post laminectomy changes L2-L3 through L3-L4. Degenerative changes at multiple levels. IMPRESSION: 1. No CT evidence for acute intra-abdominal or pelvic abnormality. 2. Small bilateral pleural effusions. Partial  consolidations within the bilateral lower lobes which may reflect  atelectasis or pneumonia. Ground-glass density in the bilateral lower lobes which may be secondary to infection or edema. 3. Fibroid uterus. 4. Fat containing umbilical hernia. Aortic Atherosclerosis (ICD10-I70.0). Electronically Signed   By: Donavan Foil M.D.   On: 08/22/2020 02:03   DG Chest 1 View  Result Date: 08/22/2020 CLINICAL DATA:  Hypoxia EXAM: CHEST  1 VIEW COMPARISON:  08/18/2020 FINDINGS: Marked worsening of bilateral airspace opacities in a pattern most suggestive of pulmonary edema. No pneumothorax or pleural effusion. Mild cardiomegaly. IMPRESSION: Marked worsening of bilateral airspace opacities, most suggestive of pulmonary edema. Electronically Signed   By: Ulyses Jarred M.D.   On: 08/22/2020 02:00    LABORATORY DATA: Lab Results  Component Value Date   WBC 18.7 (H) 08/22/2020   HGB 9.5 (L) 08/22/2020   HCT 30.2 (L) 08/22/2020   MCV 90.1 08/22/2020   PLT 372 08/22/2020    Recent Labs  Lab 08/18/20 0200 08/19/20 2201 08/22/20 0811  NA 137   < > 132*  K 4.0   < > 3.9  CL 101   < > 97*  CO2 25   < > 24  BUN 26*   < > 30*  CREATININE 0.99   < > 1.06*  CALCIUM 9.6   < > 8.8*  PROT 8.0  --   --   BILITOT 0.4  --   --   ALKPHOS 79  --   --   ALT 16  --   --   AST 19  --   --   GLUCOSE 138*   < > 162*   < > = values in this interval not displayed.    Lipid Panel  No results found for: CHOL, TRIG, HDL, CHOLHDL, VLDL, LDLCALC  BNP (last 3 results) Recent Labs    08/22/20 0557  BNP 377.8*    HEMOGLOBIN A1C No results found for: HGBA1C, MPG  Cardiac Panel (last 3 results) Recent Labs    08/18/20 0200  CKTOTAL 69    Lab Results  Component Value Date   CKTOTAL 54 08/18/2020     TSH Recent Labs    03/13/20 0949  TSH 1.190   Cardiac Panel (last 3 results) Recent Labs    08/22/20 1145  TROPONINIHS >27,000*    CARDIAC DATABASE: EKG: 08/18/2020: Normal sinus rhythm, 94 bpm,  normal axis, without underlying injury pattern.  08/22/2020 1139: Atrial fibrillation, 95 bpm, PRWP, T wave inversions in lead I and aVL suggestive of high lateral ischemia, without underlying injury pattern.  08/22/2020 1332: Atrial fibrillation, 93 bpm,  poor R wave progression, and possible anteroseptal injury pattern.  Poor R wave progression and ST changes are new compared to EKG 08/18/2020.  Echocardiogram: Pending  IMPRESSION & RECOMMENDATIONS: Kristin [name] is a 79 y.o. African-American female whose past medical history and cardiovascular risk factors include: Lumbar stenosis status post laminectomy, hypertension, CKD, diabetes, postmenopausal female, advanced age.   NSTEMI:  Given the patient's symptoms, elevated cardiac biomarkers, EKG changes patient ruled in for STEMI.  Serial EKGs reviewed as noted above.  Stat echocardiogram ordered, results pending.  Given the recent neurosurgery performed on 08/19/2020 spoke to her surgeon Dr.Dawley regarding recommendations of anticoagulation and antiplatelet therapy if she were to undergo left heart catheterization possible intervention.  His recommendation was to start IV heparin without a bolus and keep the PTT between 50-60.  Given the recent neurosurgery she is at high risk of bleeding which could lead to worsening morbidity mortality.  This was  conveyed to the patient and her daughter who is present at bedside and is agreeable with the plan of care.  Also reviewed the case and EKGs with interventional cardiology and the shared decision was to proceed with transferring the patient to telemetry bed, dosing heparin as per neurosurgery recommendations, will hold antiplatelet therapy until cleared by neurosurgery, stat echo, and initiate guideline directed medical therapy.  Also spoke to patient's admitting physician as well as inpatient pharmacy to provide closed loop communication.  Increase statin therapy.  Hold antiplatelets until  cleared by neurosurgery.  Transition her from carvedilol to metoprolol  Continue diuretic therapy.  Continue ARB.  Trend troponins.  Patient is informed to let nursing staff know if she has worsening chest discomfort.  Atrial fibrillation, newly discovered:  Focus on rate control management, we will transition her to metoprolol.  Patient will be started on IV heparin without bolus with a goal PTT between 50-60 per neurosurgery recommendations.  Echocardiogram results forthcoming.  CHA2DS2-VASc SCORE is 5 which correlates to 6.7 % risk of stroke per year (hypertension, age greater than 2, diabetes, female sex).  Hyperlipidemia: Continue statin therapy.  Hypertension: Currently managed by primary team.  CRITICAL CARE Performed by: Rex Kras   Total critical care time: 90 minutes   Critical care time was exclusive of separately billable procedures and treating other patients.   Critical care was necessary to treat or prevent imminent or life-threatening deterioration.   Critical care was time spent personally by me on the following activities: development of treatment plan with patient and/or surrogate as well as nursing, discussions with consultants, evaluation of patient's response to treatment, examination of patient, obtaining history from patient or surrogate, ordering and performing treatments and interventions, ordering and review of laboratory studies, ordering and review of radiographic studies, pulse oximetry and re-evaluation of patient's condition.  Patient's questions and concerns were addressed to her and her daughter's (Havea)satisfaction. She and  daughter (Havea)voices understanding of the instructions provided during this encounter.   This note was created using a voice recognition software as a result there may be grammatical errors inadvertently enclosed that do not reflect the nature of this encounter. Every attempt is made to correct such errors.  Mechele Claude [facility]  Pager: [phone] Office: [phone] 08/22/2020, 1:17 PM

## 2020-08-22 NOTE — Progress Notes (Signed)
  Echocardiogram 2D Echocardiogram has been performed with [facility].  Kristin [name] 08/22/2020, 2:55 PM

## 2020-08-22 NOTE — Progress Notes (Signed)
Patient came back from abdominal CT complaining of SOB and abdominal pain of 10/10. Sats in the mid 80's. Oxygen started at 4 LPM via [place]. Sats at 91%. Will update MD .

## 2020-08-23 ENCOUNTER — Inpatient Hospital Stay (HOSPITAL_COMMUNITY): Payer: Medicare HMO

## 2020-08-23 ENCOUNTER — Encounter (HOSPITAL_COMMUNITY)
Admission: EM | Disposition: A | Discharge status: Skilled Nursing Facility | Payer: Self-pay | Source: Home / Self Care | Attending: Family Medicine

## 2020-08-23 DIAGNOSIS — I4891 Unspecified atrial fibrillation: Secondary | ICD-10-CM

## 2020-08-23 DIAGNOSIS — I429 Cardiomyopathy, unspecified: Secondary | ICD-10-CM

## 2020-08-23 DIAGNOSIS — I5021 Acute systolic (congestive) heart failure: Secondary | ICD-10-CM

## 2020-08-23 DIAGNOSIS — R6510 Systemic inflammatory response syndrome (SIRS) of non-infectious origin without acute organ dysfunction: Secondary | ICD-10-CM

## 2020-08-23 DIAGNOSIS — I214 Non-ST elevation (NSTEMI) myocardial infarction: Secondary | ICD-10-CM

## 2020-08-23 HISTORY — PX: RIGHT/LEFT HEART CATH AND CORONARY ANGIOGRAPHY: CATH118266

## 2020-08-23 LAB — POCT I-STAT 7, (LYTES, BLD GAS, ICA,H+H)
Acid-Base Excess: 5 mmol/L — ABNORMAL HIGH (ref 0.0–2.0)
Bicarbonate: 27.5 mmol/L (ref 20.0–28.0)
Calcium, Ion: 1.15 mmol/L (ref 1.15–1.40)
HCT: 28 % — ABNORMAL LOW (ref 36.0–46.0)
Hemoglobin: 9.5 g/dL — ABNORMAL LOW (ref 12.0–15.0)
O2 Saturation: 97 %
Potassium: 3.1 mmol/L — ABNORMAL LOW (ref 3.5–5.1)
Sodium: 132 mmol/L — ABNORMAL LOW (ref 135–145)
TCO2: 28 mmol/L (ref 22–32)
pCO2 arterial: 32.7 mmHg (ref 32.0–48.0)
pH, Arterial: 7.532 — ABNORMAL HIGH (ref 7.350–7.450)
pO2, Arterial: 75 mmHg — ABNORMAL LOW (ref 83.0–108.0)

## 2020-08-23 LAB — BLOOD GAS, ARTERIAL
Acid-Base Excess: 1.9 mmol/L (ref 0.0–2.0)
Acid-Base Excess: 1.1 mmol/L (ref 0.0–2.0)
Bicarbonate: 25.1 mmol/L (ref 20.0–28.0)
Bicarbonate: 24.4 mmol/L (ref 20.0–28.0)
Drawn by: 517021
Drawn by: 517021
FIO2: 100
FIO2: 100
O2 Saturation: 90.8 %
O2 Saturation: 98.1 %
Patient temperature: 37
Patient temperature: 37.1
pCO2 arterial: 33.5 mmHg (ref 32.0–48.0)
pCO2 arterial: 34.2 mmHg (ref 32.0–48.0)
pH, Arterial: 7.487 — ABNORMAL HIGH (ref 7.350–7.450)
pH, Arterial: 7.468 — ABNORMAL HIGH (ref 7.350–7.450)
pO2, Arterial: 61.8 mmHg — ABNORMAL LOW (ref 83.0–108.0)
pO2, Arterial: 133 mmHg — ABNORMAL HIGH (ref 83.0–108.0)

## 2020-08-23 LAB — POCT I-STAT EG7
Acid-Base Excess: 4 mmol/L — ABNORMAL HIGH (ref 0.0–2.0)
Acid-Base Excess: 5 mmol/L — ABNORMAL HIGH (ref 0.0–2.0)
Bicarbonate: 28 mmol/L (ref 20.0–28.0)
Bicarbonate: 29.1 mmol/L — ABNORMAL HIGH (ref 20.0–28.0)
Calcium, Ion: 1.08 mmol/L — ABNORMAL LOW (ref 1.15–1.40)
Calcium, Ion: 1.1 mmol/L — ABNORMAL LOW (ref 1.15–1.40)
HCT: 27 % — ABNORMAL LOW (ref 36.0–46.0)
HCT: 27 % — ABNORMAL LOW (ref 36.0–46.0)
Hemoglobin: 9.2 g/dL — ABNORMAL LOW (ref 12.0–15.0)
Hemoglobin: 9.2 g/dL — ABNORMAL LOW (ref 12.0–15.0)
O2 Saturation: 45 %
O2 Saturation: 56 %
Potassium: 2.9 mmol/L — ABNORMAL LOW (ref 3.5–5.1)
Potassium: 3 mmol/L — ABNORMAL LOW (ref 3.5–5.1)
Sodium: 134 mmol/L — ABNORMAL LOW (ref 135–145)
Sodium: 135 mmol/L (ref 135–145)
TCO2: 29 mmol/L (ref 22–32)
TCO2: 30 mmol/L (ref 22–32)
pCO2, Ven: 38.1 mmHg — ABNORMAL LOW (ref 44.0–60.0)
pCO2, Ven: 38.1 mmHg — ABNORMAL LOW (ref 44.0–60.0)
pH, Ven: 7.474 — ABNORMAL HIGH (ref 7.250–7.430)
pH, Ven: 7.491 — ABNORMAL HIGH (ref 7.250–7.430)
pO2, Ven: 23 mmHg — CL (ref 32.0–45.0)
pO2, Ven: 27 mmHg — CL (ref 32.0–45.0)

## 2020-08-23 LAB — CULTURE, BLOOD (ROUTINE X 2)
Culture: NO GROWTH
Culture: NO GROWTH
Special Requests: ADEQUATE

## 2020-08-23 LAB — BASIC METABOLIC PANEL
Anion gap: 13 (ref 5–15)
BUN: 35 mg/dL — ABNORMAL HIGH (ref 8–23)
CO2: 22 mmol/L (ref 22–32)
Calcium: 8.6 mg/dL — ABNORMAL LOW (ref 8.9–10.3)
Chloride: 95 mmol/L — ABNORMAL LOW (ref 98–111)
Creatinine, Ser: 1.18 mg/dL — ABNORMAL HIGH (ref 0.44–1.00)
GFR calc Af Amer: 50 mL/min — ABNORMAL LOW (ref >60)
GFR calc non Af Amer: 44 mL/min — ABNORMAL LOW (ref >60)
Glucose, Bld: 190 mg/dL — ABNORMAL HIGH (ref 70–99)
Potassium: 3.6 mmol/L (ref 3.5–5.1)
Sodium: 130 mmol/L — ABNORMAL LOW (ref 135–145)

## 2020-08-23 LAB — BRAIN NATRIURETIC PEPTIDE: B Natriuretic Peptide: 1088.7 pg/mL — ABNORMAL HIGH (ref 0.0–100.0)

## 2020-08-23 LAB — BODY FLUID CULTURE: Culture: NO GROWTH

## 2020-08-23 LAB — GLUCOSE, CAPILLARY
Glucose-Capillary: 132 mg/dL — ABNORMAL HIGH (ref 70–99)
Glucose-Capillary: 141 mg/dL — ABNORMAL HIGH (ref 70–99)
Glucose-Capillary: 149 mg/dL — ABNORMAL HIGH (ref 70–99)

## 2020-08-23 LAB — CARDIAC CATHETERIZATION: Cath EF Quantitative: 35 %

## 2020-08-23 LAB — PROCALCITONIN: Procalcitonin: 0.59 ng/mL

## 2020-08-23 LAB — APTT
aPTT: 89 seconds — ABNORMAL HIGH (ref 24–36)
aPTT: 40 seconds — ABNORMAL HIGH (ref 24–36)

## 2020-08-23 SURGERY — RIGHT/LEFT HEART CATH AND CORONARY ANGIOGRAPHY
Anesthesia: LOCAL

## 2020-08-23 MED ORDER — SODIUM CHLORIDE 0.9 % IV SOLN: 250.0000 mL | INTRAVENOUS | Status: DC | PRN

## 2020-08-23 MED ORDER — FUROSEMIDE 10 MG/ML IJ SOLN
40.0000 mg | Freq: Three times a day (TID) | INTRAMUSCULAR | Status: DC
  Administered 2020-08-23 – 2020-08-24 (×3): 40 mg via INTRAVENOUS
  Filled 2020-08-23 (×3): qty 4

## 2020-08-23 MED ORDER — LIDOCAINE HCL (PF) 1 % IJ SOLN
INTRAMUSCULAR | Status: AC
  Filled 2020-08-23: qty 30

## 2020-08-23 MED ORDER — POTASSIUM CHLORIDE 10 MEQ/100ML IV SOLN
10.0000 meq | INTRAVENOUS | Status: AC
  Administered 2020-08-23 (×2): 10 meq via INTRAVENOUS
  Filled 2020-08-23 (×2): qty 100

## 2020-08-23 MED ORDER — SACUBITRIL-VALSARTAN 24-26 MG PO TABS
1.0000 | ORAL_TABLET | Freq: Two times a day (BID) | ORAL | Status: DC
  Administered 2020-08-23 – 2020-08-24 (×3): 1 via ORAL
  Filled 2020-08-23 (×4): qty 1

## 2020-08-23 MED ORDER — SODIUM CHLORIDE 0.9% FLUSH
3.0000 mL | INTRAVENOUS | Status: DC | PRN
  Administered 2020-08-23: 3 mL via INTRAVENOUS

## 2020-08-23 MED ORDER — HEPARIN (PORCINE) IN NACL 1000-0.9 UT/500ML-% IV SOLN
INTRAVENOUS | Status: AC
  Filled 2020-08-23: qty 1000

## 2020-08-23 MED ORDER — IOHEXOL 350 MG/ML SOLN
INTRAVENOUS | Status: DC | PRN
  Administered 2020-08-23: 35 mL

## 2020-08-23 MED ORDER — SODIUM CHLORIDE 0.9% FLUSH: 3.0000 mL | INTRAVENOUS | Status: DC | PRN

## 2020-08-23 MED ORDER — FUROSEMIDE 10 MG/ML IJ SOLN
40.0000 mg | Freq: Three times a day (TID) | INTRAMUSCULAR | Status: DC
  Administered 2020-08-23: 40 mg via INTRAVENOUS
  Filled 2020-08-23 (×2): qty 4

## 2020-08-23 MED ORDER — LIDOCAINE HCL (PF) 1 % IJ SOLN
INTRAMUSCULAR | Status: DC | PRN
  Administered 2020-08-23: 10 mL

## 2020-08-23 MED ORDER — SODIUM CHLORIDE 0.9% FLUSH: 3.0000 mL | Freq: Two times a day (BID) | INTRAVENOUS | Status: DC

## 2020-08-23 MED ORDER — SODIUM CHLORIDE 0.9 % IV SOLN
250.0000 mL | INTRAVENOUS | Status: DC | PRN
  Administered 2020-08-24: 200 mL via INTRAVENOUS

## 2020-08-23 MED ORDER — SODIUM CHLORIDE 0.9 % IV SOLN: INTRAVENOUS | Status: DC

## 2020-08-23 MED ORDER — HEPARIN (PORCINE) IN NACL 1000-0.9 UT/500ML-% IV SOLN
INTRAVENOUS | Status: DC | PRN
  Administered 2020-08-23 (×2): 500 mL

## 2020-08-23 MED ORDER — SODIUM CHLORIDE 0.9% FLUSH
3.0000 mL | Freq: Two times a day (BID) | INTRAVENOUS | Status: DC
  Administered 2020-08-23 – 2020-08-24 (×4): 3 mL via INTRAVENOUS

## 2020-08-23 MED ORDER — FUROSEMIDE 10 MG/ML IJ SOLN
40.0000 mg | Freq: Once | INTRAMUSCULAR | Status: AC
  Administered 2020-08-23: 40 mg via INTRAVENOUS
  Filled 2020-08-23: qty 4

## 2020-08-23 SURGICAL SUPPLY — 19 items
CATH INFINITI 5 FR 3DRC (CATHETERS) ×1 IMPLANT
CATH INFINITI 5FR JL4 (CATHETERS) ×1 IMPLANT
CATH INFINITI 5FR MPB2 (CATHETERS) ×1 IMPLANT
CATH SWAN GANZ 7F STRAIGHT (CATHETERS) ×1 IMPLANT
CATH SWAN GANZ VIP 7.5F (CATHETERS) IMPLANT
CLOSURE MYNX CONTROL 6F/7F (Vascular Products) ×1 IMPLANT
DEVICE CLOSURE PERCLS PRGLD 6F (VASCULAR PRODUCTS) IMPLANT
KIT ESSENTIALS PG (KITS) ×2 IMPLANT
KIT HEART LEFT (KITS) ×2 IMPLANT
NDL PERC 21GX4CM (NEEDLE) IMPLANT
NEEDLE PERC 21GX4CM (NEEDLE) ×2 IMPLANT
PACK CARDIAC CATHETERIZATION (CUSTOM PROCEDURE TRAY) ×2 IMPLANT
PERCLOSE PROGLIDE 6F (VASCULAR PRODUCTS) ×2
SHEATH PINNACLE 5F 10CM (SHEATH) ×1 IMPLANT
SHEATH PINNACLE 7F 10CM (SHEATH) ×1 IMPLANT
SHEATH PROBE COVER 6X72 (BAG) ×1 IMPLANT
SLEEVE REPOSITIONING LENGTH 30 (MISCELLANEOUS) ×1 IMPLANT
TRANSDUCER W/STOPCOCK (MISCELLANEOUS) ×2 IMPLANT
WIRE EMERALD 3MM-J .035X150CM (WIRE) ×1 IMPLANT

## 2020-08-23 NOTE — Progress Notes (Signed)
   08/23/20 0111  Assess: MEWS Score  Temp 98.8 F (37.1 C)  BP (!) 145/97  Pulse Rate 92  ECG Heart Rate 88  Resp (!) 26  Level of Consciousness Alert  SpO2 93 %  O2 Device Non-rebreather Mask  Assess: MEWS Score  MEWS Temp 0  MEWS Systolic 0  MEWS Pulse 0  MEWS RR 2  MEWS LOC 0  MEWS Score 2  MEWS Score Color Yellow  Assess: if the MEWS score is Yellow or Red  Were vital signs taken at a resting state? Yes  Focused Assessment Change from prior assessment (see assessment flowsheet)  Early Detection of Sepsis Score *See Row Information* Medium  MEWS guidelines implemented *See Row Information* Yes  Treat  Pain Scale 0-10  Pain Score 0  Take Vital Signs  Increase Vital Sign Frequency  Yellow: Q 2hr X 2 then Q 4hr X 2, if remains yellow, continue Q 4hrs  Escalate  MEWS: Escalate Yellow: discuss with charge nurse/RN and consider discussing with provider and RRT  Notify: Charge Nurse/RN  Name of Charge Nurse/RN Notified [place]  Date Charge Nurse/RN Notified 08/23/20  Time Charge Nurse/RN Notified 0110  Notify: Provider  Provider Name/Title Blane Ohara  Date Provider Notified 08/23/20  Notification Type Page  Notification Reason Change in status  Response See new orders  Notify: Rapid Response  Name of Rapid Response RN Notified Mindy,RN  Date Rapid Response Notified 08/23/20  Time Rapid Response Notified 0100

## 2020-08-23 NOTE — CV Procedure (Signed)
  OSTIAL RCA 80% TO 90%  MILD DISEASE CX AND OM-1 AND 2. PROXIMAL LAD 80% FOLLOWED BY TOTAL OCCLUSION WITH TIMI 0 FLOW.  PROX LAD OCCLUDED. APICAL LAD FILLS FAINTLY WITH COLLATERALS. LARGE D1 PATENT AT THE SITE OF SUBTOTAL OCCLUSION.  LVEF 35% WITH ENTIRE ANTERIOR, APICAL AND APICAL INFERIOR AKINESIS   Kristin Prows, MD, [facility] 08/23/2020, 1:47 PM Office: [phone]

## 2020-08-23 NOTE — Interval H&P Note (Signed)
History and Physical Interval Note:  08/23/2020 12:47 PM  Kristin [name]  has presented today for surgery, with the diagnosis of Pulmonary edema.  The various methods of treatment have been discussed with the patient and family. After consideration of risks, benefits and other options for treatment, the patient has consented to  Procedure(s): IABP INSERTION (N/A) right and left heart catheterization as a surgical intervention.  The patient's history has been reviewed, patient examined, no change in status, stable for surgery.  I have reviewed the patient's chart and labs.  Questions were answered to the patient's satisfaction.     [name]

## 2020-08-23 NOTE — Progress Notes (Addendum)
Patient restless, with increased work of breathing, tachypneic and oxygen saturations 91% on NRB, crackles noted upon auscultation. RN notified provider of status change via Amion, Called RRT and RT. Callback from provider received and new orders placed.

## 2020-08-23 NOTE — Progress Notes (Signed)
PT Cancellation Note  Patient Details Name: Kristin [name] MRN: [id number] DOB: 02-Dec-1941   Cancelled Treatment:    Reason Eval/Treat Not Completed: Medical issues which prohibited therapy (per RN, pt is on bipap and not appropriate for PT at this time. Will follow.)  Philomena Doheny PT 08/23/2020  Acute Rehabilitation Services Pager [phone] Office [phone]

## 2020-08-23 NOTE — Progress Notes (Signed)
ANTICOAGULATION CONSULT NOTE   Pharmacy Consult for Heparin Indication: ACS  No Known Allergies  Patient Measurements: Height: 5\' 4"  (162.6 cm) Weight: 93.3 kg (205 lb 11 oz) IBW/kg (Calculated) : 54.7 Heparin Dosing Weight: 75 kg  Vital Signs: Temp: 98.7 F (37.1 C) (09/15 0814) Temp Source: Axillary (09/15 0814) BP: 130/90 (09/15 0814) Pulse Rate: 88 (09/15 0814)  Labs: Recent Labs    08/20/20 1429 08/20/20 1429 08/21/20 0237 08/22/20 0811 08/22/20 1145 08/22/20 1145 08/22/20 1323 08/22/20 1652 08/22/20 1759 08/22/20 1830 08/23/20 0056  HGB 8.9*   < > 9.0*  --  9.5*  --   --   --   --   --   --   HCT 28.2*  --  28.3*  --  30.2*  --   --   --   --   --   --   PLT 259  --  303  --  372  --   --   --   --   --   --   APTT  --   --   --   --   --   --   --   --   --   --  89*  CREATININE 1.62*   < > 1.30*   < >  --   --   --  1.17* 1.13*  --  1.18*  TROPONINIHS  --   --   --   --  >27,000*   < > >27,000* >27,000*  --  >27,000*  --    < > = values in this interval not displayed.    Estimated Creatinine Clearance: 42.1 mL/min (A) (by C-G formula based on SCr of 1.18 mg/dL (H)).  Assessment: 79 yo female with new onset ACS per telephone conversation with Dr. Terri Skains of Cardiology. Patient has had a recent Neurosurgical procedure, therefore Neurosurgery is requesting no boluses and a tight APTT goal of 50-60.   Aptt high this am, rate adjusted and now below goal on recheck at 40s drawn just prior to cath. Neurosurgery has communicated with [place] that they would like a very tight low range and would prefer slow up titrations to goal as opposed to overshooting and decreasing rate.  Heparin restarted in cath lab, will make small rate adjustment. Patient stable back in room post cath, no bleeding issues noted. Mynx closure device used in lab.   Goal of Therapy:  APTT 50-60 per neurosurgery Monitor platelets by anticoagulation protocol: Yes   Plan:  Increase heparin drip to  950 units/hr  APTT in 6 hours  Erin Hearing PharmD., BCPS Clinical Pharmacist 08/23/2020 11:14 AM

## 2020-08-23 NOTE — Progress Notes (Signed)
Pt transported from cath lab to [facility] [facility] on BIPAP. No complications noted.

## 2020-08-23 NOTE — Progress Notes (Signed)
Pt taken off BIPAP and placed on 8L HFNC for RN to give meds. Pt is tolerating well at this time.

## 2020-08-23 NOTE — H&P (Signed)
Patient presenting with acute decompensated systolic heart failure and pulmonary edema needing BiPAP.  Patient still continues to have respiratory distress, although symptoms are mildly improved, I suspect she probably will decompensate in view of probable proximal LAD disease.  Best option is to proceed with diagnostic angiography in view of patient not being able to receive any antiplatelet therapy and if ischemic cardiomyopathy and LV dysfunction is confirmed, to proceed with placement of intra-aortic balloon pump.  Dr. Terri Skains has discussed with her daughter regarding the procedure as well.  I saw the patient on the floor, will proceed with diagnostic angiography and possible placement of enteric balloon pump as discussed.  She will also need right heart catheterization to evaluate for acute pulmonary edema and precardiogenic shock.  Further recommendations to follow.  All questions have been answered.   [name], MD, [facility] 08/23/2020, 12:46 PM Office: [phone]

## 2020-08-23 NOTE — Progress Notes (Addendum)
Bipap removed for po medication administration per Dr. Einar Gip.  Spoke to RT instructed to place patient on 5L [place].    Follow up: received call from Cath Lab, instructed to hold meds per Rodney/Dr. Einar Gip and prep patient for pick up. Patient placed back on Bipap by respiratory.

## 2020-08-23 NOTE — Progress Notes (Addendum)
Progress Note  Patient Name: Kristin [name] Date of Encounter: 08/23/2020  Attending physician: Oswald Hillock, MD Primary care provider: Caren Macadam, MD Primary Cardiologist: NA Consultant:Kennley Schwandt Azalia Bilis, [facility]  Subjective: Kristin [name] is a 79 y.o. female who was seen and examined at bedside at approximately 8:26 AM. No family present at bedside. Patient currently on BiPAP. Course of events discussed with the patient's nurse this morning. Patient is resting comfortably. She denies any chest pain. She is able to move all 4 extremities and follow to commands.  Objective: Vital Signs in the last 24 hours: Temp:  [97.8 F (36.6 C)-98.8 F (37.1 C)] 98.7 F (37.1 C) (09/15 0814) Pulse Rate:  [76-93] 88 (09/15 0814) Resp:  [17-26] 20 (09/15 0814) BP: (113-153)/(65-104) 130/90 (09/15 0814) SpO2:  [90 %-100 %] 94 % (09/15 0814) FiO2 (%):  [80 %-100 %] 80 % (09/15 0420)  Intake/Output:  Intake/Output Summary (Last 24 hours) at 08/23/2020 0907 Last data filed at 08/23/2020 0141 Gross per 24 hour  Intake 510 ml  Output 1875 ml  Net -1365 ml    Net IO Since Admission: 4,005.46 mL [08/23/20 0907]  Weights:  Filed Weights   08/18/20 2354 08/20/20 0436  Weight: 89.2 kg 93.3 kg    Telemetry: Atrial fibrillation.  Physical examination: PHYSICAL EXAM: Vitals with BMI 08/23/2020 08/23/2020 08/23/2020  Height - - -  Weight - - -  BMI - - -  Systolic 335 456 256  Diastolic 90 85 72  Pulse 88 85 84    CONSTITUTIONAL: Well-developed and well-nourished. No acute distress.  Hemodynamically stable.  Currently on BiPAP.  Sitting upright in her hospital bed. SKIN: Skin is warm and dry. No rash noted.  HEAD: Normocephalic and atraumatic.  EYES: No scleral icterus MOUTH/THROAT: Dry oral membranes.  NECK: JVP present. No thyromegaly noted. No carotid bruits  LYMPHATIC: No visible cervical adenopathy.  CHEST Normal respiratory effort. No intercostal retractions  LUNGS: Clear  to auscultation in the upper lung fields with rales noted at the bases.  No expiratory wheezes. CARDIOVASCULAR: Irregularly irregular, variable L8-L3, soft holosystolic murmur heard at the apex, no gallops or rubs. ABDOMINAL: Obese, soft, nontender, distended, positive bowel sounds in all 4 quadrants, no apparent ascites.  EXTREMITIES: No peripheral edema, warm to touch bilaterally.  Able to lift both of her lower extremities against gravity. HEMATOLOGIC: No significant bruising NEUROLOGIC: Oriented to person, place, and time. Nonfocal. Normal muscle tone.  PSYCHIATRIC: Normal mood and affect. Normal behavior. Cooperative  Lab Results: Hematology Recent Labs  Lab 08/20/20 1429 08/21/20 0237 08/22/20 1145  WBC 15.1* 14.5* 18.7*  RBC 3.09* 3.12* 3.35*  HGB 8.9* 9.0* 9.5*  HCT 28.2* 28.3* 30.2*  MCV 91.3 90.7 90.1  MCH 28.8 28.8 28.4  MCHC 31.6 31.8 31.5  RDW 13.1 12.9 12.9  PLT 259 303 372    Chemistry Recent Labs  Lab 08/18/20 0200 08/19/20 2201 08/22/20 1652 08/22/20 1759 08/23/20 0056  NA 137   < > 131* 130* 130*  K 4.0   < > 3.7 3.5 3.6  CL 101   < > 94* 94* 95*  CO2 25   < > 24 21* 22  GLUCOSE 138*   < > 185* 178* 190*  BUN 26*   < > 33* 34* 35*  CREATININE 0.99   < > 1.17* 1.13* 1.18*  CALCIUM 9.6   < > 9.2 9.0 8.6*  PROT 8.0  --   --  7.2  --   ALBUMIN 3.9  --   --  2.8*  --   AST 19  --   --  190*  --   ALT 16  --   --  66*  --   ALKPHOS 79  --   --  79  --   BILITOT 0.4  --   --  0.5  --   GFRNONAA 54*   < > 44* 46* 44*  GFRAA >60   < > 51* 53* 50*  ANIONGAP 11   < > 13 15 13    < > = values in this interval not displayed.     Cardiac Enzymes: Cardiac Panel (last 3 results) Recent Labs    08/22/20 1323 08/22/20 1652 08/22/20 1830  TROPONINIHS >27,000* >27,000* >27,000*    BNP (last 3 results) Recent Labs    08/22/20 0557 08/22/20 1759 08/23/20 0056  BNP 377.8* 833.1* 1,088.7*    ProBNP (last 3 results) No results for input(s): PROBNP in  the last 8760 hours.   DDimer No results for input(s): DDIMER in the last 168 hours.   Hemoglobin A1c: No results found for: HGBA1C, MPG  TSH  Recent Labs    03/13/20 0949 08/22/20 1759  TSH 1.190 1.054    Lipid Panel No results found for: CHOL, TRIG, HDL, CHOLHDL, VLDL, LDLCALC, LDLDIRECT  Imaging: CT ABDOMEN PELVIS WO CONTRAST  Result Date: 08/22/2020 CLINICAL DATA:  Fever of unknown origin EXAM: CT ABDOMEN AND PELVIS WITHOUT CONTRAST TECHNIQUE: Multidetector CT imaging of the abdomen and pelvis was performed following the standard protocol without IV contrast. COMPARISON:  CT 03/31/2020 FINDINGS: Lower chest: Small bilateral pleural effusions. Ground-glass density within the bilateral lower lobes with partial consolidation. Borderline cardiomegaly. Hepatobiliary: No focal liver abnormality is seen. No gallstones, gallbladder wall thickening, or biliary dilatation. Pancreas: Unremarkable. No pancreatic ductal dilatation or surrounding inflammatory changes. Spleen: Normal in size without focal abnormality. Adrenals/Urinary Tract: Nodular thickening of the adrenal glands without dominant mass. Kidneys show no hydronephrosis. Cortical scarring in the mid left kidney. The urinary bladder is unremarkable. Stomach/Bowel: The stomach is nonenlarged. No dilated small bowel. No bowel wall thickening. Negative appendix. Large amount of stool in the colon. Vascular/Lymphatic: Moderate aortic atherosclerosis without aneurysm. No suspicious nodes. Reproductive: Lobulated uterus with multiple fibroids some of which are calcified. 1.8 cm benign-appearing left adnexal cyst without change, no further workup recommended. Other: Negative for free air or free fluid. Fat containing umbilical hernia. Musculoskeletal: No acute osseous abnormality. Post laminectomy changes L2-L3 through L3-L4. Degenerative changes at multiple levels. IMPRESSION: 1. No CT evidence for acute intra-abdominal or pelvic abnormality. 2.  Small bilateral pleural effusions. Partial consolidations within the bilateral lower lobes which may reflect atelectasis or pneumonia. Ground-glass density in the bilateral lower lobes which may be secondary to infection or edema. 3. Fibroid uterus. 4. Fat containing umbilical hernia. Aortic Atherosclerosis (ICD10-I70.0). Electronically Signed   By: Donavan Foil M.D.   On: 08/22/2020 02:03   DG Chest 1 View  Result Date: 08/23/2020 CLINICAL DATA:  Pulmonary edema EXAM: CHEST  1 VIEW COMPARISON:  08/22/2020 FINDINGS: Mild bilateral interstitial opacities. Small left pleural effusion. Cardiomediastinal contours are normal. Increased density at the right hilum is likely due to pulmonary vascular congestion given the relatively normal appearance on 08/18/2020. IMPRESSION: Mild pulmonary edema and small left pleural effusion. Electronically Signed   By: Ulyses Jarred M.D.   On: 08/23/2020 01:43   DG Chest 1 View  Result Date: 08/22/2020 CLINICAL DATA:  Hypoxia EXAM: CHEST  1 VIEW COMPARISON:  08/18/2020 FINDINGS: Marked  worsening of bilateral airspace opacities in a pattern most suggestive of pulmonary edema. No pneumothorax or pleural effusion. Mild cardiomegaly. IMPRESSION: Marked worsening of bilateral airspace opacities, most suggestive of pulmonary edema. Electronically Signed   By: Ulyses Jarred M.D.   On: 08/22/2020 02:00   DG Chest Port 1 View  Result Date: 08/22/2020 CLINICAL DATA:  Sudden onset shortness of breath. Body aches and fever. EXAM: PORTABLE CHEST 1 VIEW COMPARISON:  08/22/2020 FINDINGS: Low lung volumes are present, causing crowding of the pulmonary vasculature. Indistinct pulmonary vasculature with perihilar and basilar airspace opacities. The peripheral interstitial accentuation and Kerley B lines suggestive of interstitial edema have mildly improved, but the basilar airspace opacities are mildly worsened. Thoracic spondylosis.  Heart size within normal limits. IMPRESSION: 1. Bilateral  interstitial and airspace opacities potentially from edema or atypical pneumonia. Mildly worsened basilar airspace opacities although the interstitial accentuation has improved. Electronically Signed   By: Van Clines M.D.   On: 08/22/2020 18:22   ECHOCARDIOGRAM COMPLETE  Result Date: 08/22/2020    ECHOCARDIOGRAM REPORT   Patient Name:   Kristin [name] Date of Exam: 08/22/2020 Medical Rec #:  [id number]        Height:       64.0 in Accession #:    [id number]       Weight:       205.7 lb Date of Birth:  Nov 22, 1941         BSA:          1.980 m Patient Age:    74 years         BP:           138/104 mmHg Patient Gender: F                HR:           105 bpm. Exam Location:  Inpatient Procedure: 2D Echo, Cardiac Doppler, Color Doppler and Intracardiac            Opacification Agent Indications:     Dyspnea  History:         Patient has no prior history of Echocardiogram examinations.                  Risk Factors:Hypertension.  Sonographer:     Clayton Lefort RDCS (AE) Referring Phys:  [id number] [facility] Diagnosing Phys: Rex Kras DO IMPRESSIONS  1. Left ventricular ejection fraction, by estimation, is 30 to 35%. The left ventricle has moderately decreased function. The left ventricle demonstrates regional wall motion abnormalities. Hyokinesis of base to mid anterior wall, base to mid anterospetal wall, apical septal, and apex. There is moderate left ventricular hypertrophy. Diastolic function is not assessed due to atrial fibrillation. No apparent left ventricular thrombus.  2. Right ventricular systolic function is normal. The right ventricular size is normal. There is moderately elevated pulmonary artery systolic pressure. The estimated right ventricular systolic pressure is 44.0 mmHg.  3. The mitral valve is normal in structure. Mild mitral valve regurgitation. No evidence of mitral stenosis.  4. The aortic valve is normal in structure. Aortic valve regurgitation is not visualized. Mild aortic valve  sclerosis is present, with no evidence of aortic valve stenosis.  5. The inferior vena cava is dilated in size with >50% respiratory variability, suggesting right atrial pressure of 8 mmHg. FINDINGS  Left Ventricle: Left ventricular ejection fraction, by estimation, is 30 to 35%. The left ventricle has moderately decreased function. The left ventricle demonstrates regional wall  motion abnormalities. Hyokinesis of base to mid anterior wall, base to mid anterospetal wall, apical septal, and apex. Definity contrast agent was given IV to delineate the left ventricular endocardial borders. The left ventricular internal cavity size was normal in size. There is moderate left ventricular hypertrophy. Left  ventricular diastolic function could not be evaluated. Right Ventricle: The right ventricular size is normal. Right vetricular wall thickness was not well visualized. Right ventricular systolic function is normal. There is moderately elevated pulmonary artery systolic pressure. The tricuspid regurgitant velocity is 4.07 m/s, and with an assumed right atrial pressure of 8 mmHg, the estimated right ventricular systolic pressure is 78.9 mmHg. Left Atrium: Left atrial size was normal in size. Right Atrium: Right atrial size was normal in size. Pericardium: There is no evidence of pericardial effusion. Mitral Valve: The mitral valve is normal in structure. Mild mitral annular calcification. Mild mitral valve regurgitation. No evidence of mitral valve stenosis. Tricuspid Valve: The tricuspid valve is normal in structure. Tricuspid valve regurgitation is mild . No evidence of tricuspid stenosis. Aortic Valve: The aortic valve is normal in structure. Aortic valve regurgitation is not visualized. Mild aortic valve sclerosis is present, with no evidence of aortic valve stenosis. Aortic valve mean gradient measures 2.0 mmHg. Aortic valve peak gradient measures 3.9 mmHg. Aortic valve area, by VTI measures 1.72 cm. Pulmonic Valve:  The pulmonic valve was grossly normal. Pulmonic valve regurgitation is trivial. Aorta: The aortic root and ascending aorta are structurally normal, with no evidence of dilitation. Venous: The inferior vena cava is dilated in size with greater than 50% respiratory variability, suggesting right atrial pressure of 8 mmHg. IAS/Shunts: The atrial septum is grossly normal.  LEFT VENTRICLE PLAX 2D LVIDd:         4.00 cm LVIDs:         2.50 cm LV PW:         1.40 cm LV IVS:        1.30 cm LVOT diam:     1.80 cm LV SV:         26 LV SV Index:   13 LVOT Area:     2.54 cm  LV Volumes (MOD) LV vol d, MOD A2C: 107.0 ml LV vol d, MOD A4C: 89.0 ml LV vol s, MOD A2C: 78.5 ml LV vol s, MOD A4C: 61.0 ml LV SV MOD A2C:     28.5 ml LV SV MOD A4C:     89.0 ml LV SV MOD BP:      31.6 ml RIGHT VENTRICLE          IVC RV Basal diam:  2.60 cm  IVC diam: 2.00 cm LEFT ATRIUM             Index       RIGHT ATRIUM          Index LA diam:        3.70 cm 1.87 cm/m  RA Area:     9.43 cm LA Vol (A2C):   63.2 ml 31.92 ml/m RA Volume:   18.60 ml 9.39 ml/m LA Vol (A4C):   61.3 ml 30.96 ml/m LA Biplane Vol: 63.1 ml 31.87 ml/m  AORTIC VALVE AV Area (Vmax):    1.82 cm AV Area (Vmean):   1.92 cm AV Area (VTI):     1.72 cm AV Vmax:           98.50 cm/s AV Vmean:          70.700 cm/s  AV VTI:            0.151 m AV Peak Grad:      3.9 mmHg AV Mean Grad:      2.0 mmHg LVOT Vmax:         70.57 cm/s LVOT Vmean:        53.467 cm/s LVOT VTI:          0.102 m LVOT/AV VTI ratio: 0.67  AORTA Ao Root diam: 3.60 cm Ao Asc diam:  3.40 cm TRICUSPID VALVE TR Peak grad:   66.3 mmHg TR Vmax:        407.00 cm/s  SHUNTS Systemic VTI:  0.10 m Systemic Diam: 1.80 cm Kristin Lorman DO Electronically signed by Rex Kras DO Signature Date/Time: 08/22/2020/4:31:42 PM    Final     Cardiac database: EKG: 08/18/2020: Normal sinus rhythm, 94 bpm, normal axis, without underlying injury pattern.  08/22/2020 1139: Atrial fibrillation, 95 bpm, PRWP, T wave inversions in lead I  and aVL suggestive of high lateral ischemia, without underlying injury pattern.  08/22/2020 1332: Atrial fibrillation, 93 bpm,  poor R wave progression, and possible anteroseptal injury pattern.  Poor R wave progression and ST changes are new compared to EKG 08/18/2020.  Echocardiogram: ECHO COMPLETE WITH IMAGING ENHANCING AGENT 08/22/2020 1. Left ventricular ejection fraction, by estimation, is 30 to 35%. The left ventricle has moderately decreased function. The left ventricle demonstrates regional wall motion abnormalities. Hyokinesis of base to mid anterior wall, base to mid anterospetal wall, apical septal, and apex. There is moderate left ventricular hypertrophy. Diastolic function is not assessed due to atrial fibrillation. No apparent left ventricular thrombus. 2. Right ventricular systolic function is normal. The right ventricular size is normal. There is moderately elevated pulmonary artery systolic pressure. The estimated right ventricular systolic pressure is 67.8 mmHg. 3. The mitral valve is normal in structure. Mild mitral valve regurgitation. No evidence of mitral stenosis. 4. The aortic valve is normal in structure. Aortic valve regurgitation is not visualized. Mild aortic valve sclerosis is present, with no evidence of aortic valve stenosis. 5. The inferior vena cava is dilated in size with >50% respiratory variability, suggesting right atrial pressure of 8 mmHg.  Scheduled Meds: . cholecalciferol  3,000 Units Oral Daily  . cloNIDine  0.1 mg Transdermal Weekly  . colchicine  0.3 mg Oral Daily  . diclofenac Sodium  2 g Topical QID  . furosemide  40 mg Intravenous Q8H  . losartan  25 mg Oral Daily  . metoprolol tartrate  25 mg Oral BID  . pantoprazole  40 mg Oral BID  . polyethylene glycol  17 g Oral BID  . potassium chloride  20 mEq Oral BID  . rosuvastatin  40 mg Oral QHS  . senna-docusate  1 tablet Oral BID  . sodium chloride flush  10-40 mL Intracatheter Q12H  . sucralfate   1 g Oral TID WC & HS    Continuous Infusions: . amiodarone 30 mg/hr (08/23/20 0731)  . ceFEPime (MAXIPIME) IV 2 g (08/23/20 0318)  . heparin 900 Units/hr (08/23/20 0138)  . methocarbamol (ROBAXIN) IV    . nitroGLYCERIN 15 mcg/min (08/23/20 0153)    PRN Meds: acetaminophen **OR** acetaminophen, alum & mag hydroxide-simeth, bisacodyl, hydrALAZINE, HYDROcodone-acetaminophen, HYDROmorphone (DILAUDID) injection, menthol-cetylpyridinium **OR** phenol, methocarbamol **OR** methocarbamol (ROBAXIN) IV, morphine injection, nitroGLYCERIN, ondansetron **OR** ondansetron (ZOFRAN) IV, oxyCODONE-acetaminophen, sodium chloride flush   IMPRESSION & RECOMMENDATIONS: Kristin [name] is a 79 y.o. female whose past medical history and cardiac risk factors  include: NSTEMI, Acute pulmonary edema, Acute HFrEF, cardiomyopathy, Atrial fibrillation (new onset),  Lumbar stenosis status post laminectomy, hypertension, CKD, diabetes, postmenopausal female, advanced age.   Impression:  NSTEMI Currently on heparin gtt with goal aPTT between 50-60 per neurosurgery recommendations. Pharmacy to dose.   Currently on BiPAP, pulmonary following.  Continue statin Continue home dose beta blockers.  Continue IV nitro gtt for pulmonary edema  Continue IV lasix  Will schedule for left heart cath once cleared by neurosurgery for full anticoagulation and use of antiplatelets Patient aware of the plan of care.   Acute HFrEF: see above. Change Losartan to Entresto. Monitor Bun and Cr.   Acute Pulmonary Edema: continue BiPAP and diuresis, strict I and Os, daily weights.   Atrial fibrillation, newly discovered:  On IV heparin with a goal PTT between 50-60 per neurosurgery recommendations.Pharmacy to dose.    Continue Amiodarone gtt.   CHA2DS2-VASc SCORE is 5 which correlates to 6.7 % risk of stroke per year (hypertension, age greater than 69, diabetes, female sex).  Continue telemetry.   Hyperlipidemia: Continue statin  therapy.  Hypertension: Currently managed by primary team.  Plan of care d/w her RN as well.   Patient's questions and concerns were addressed to her satisfaction. She voices understanding of the instructions provided during this encounter.   This note was created using a voice recognition software as a result there may be grammatical errors inadvertently enclosed that do not reflect the nature of this encounter. Every attempt is made to correct such errors.  Rex Kras, DO, [facility] Office: [phone] 08/23/2020, 9:07 AM    Addendum: Given the fact of the patient's oxygen requirements have increased over the last 24 hours and now requiring BiPAP support.  This was discussed with interventional cardiology and the plan is for left and right heart catheterization with possible intra-aortic balloon pump for hemodynamic support 48/90.    Called her daughter Rema Fendt reviewed the risk, benefits, and alternatives for left and right heart catheterization.  She provides verbal consent of the phone.  She states that she is currently at work and on visit her after work.  The procedure of left and right heart catheterization and intra-aortic balloon pump was explained to the patient's daughter in detail.  The indication, alternatives, risks and benefits were reviewed.  Complications include but not limited to bleeding, infection, vascular injury, stroke, myocardial infection, arrhythmia, kidney injury, radiation-related injury in the case of prolonged fluoroscopy use, emergency cardiac surgery, and death. The patient understands the risks of serious complication is 1-2 in 4142 with diagnostic cardiac cath and 1-2% or less with angioplasty/stenting.  I have discussed in particular detail the possibility of acute renal failure after coronary angiography particularly if PCI is necessary.  I have described that it is possible patient may need short-term dialysis and the less likely  may also require long-term dialysis depending on renal recovery.  I have asked for consent to proceed with coronary angiography understanding the risks of potentially needing dialysis, as well as the risks as stated above.   The patient's daughter voices understanding and provides verbal feedback and wishes to proceed.  Dr.Ganji will speak to the patient as well prior to the procedure.  Spoke to her nurse as well.   Mechele Claude [facility]  Pager: [phone] Office: [phone] 08/23/20 11:23 AM

## 2020-08-23 NOTE — Progress Notes (Addendum)
HOSPITAL MEDICINE OVERNIGHT EVENT NOTE    Notified by nursing that patient continues to experience progressively worsening shortness of breath despite intravenous diuretics provided through the day.  Patient is suffering from an NSTEMI in the setting of controlled atrial fibrillation and acute systolic congestive heart failure with severe cardiogenic pulmonary edema.  Patient has been on a nonrebreather mask throughout the majority of the day and is currently saturating at 91%.  Patient is tachypneic but awake and alert.  Obtaining repeat chest x-ray, ABG and EKG.  Provided patient with additional dose of 40 mg of IV Lasix.  We will review results of these studies and monitor for symptomatic improvement.  If patient does not rapidly improve will likely initiate BiPAP therapy to assist in temporizing patient's respiratory status as we continue to diurese the patient.  Kristin Emerald  MD [facility]   ADDENDUM  Patient has been evaluated at the bedside.  Patient is lethargic but arousable and responsive to verbal stimuli.  Patient is notably tachypneic.  Cardiac exam reveals controlled rate with irregularly irregular rhythm.  Lung exam reveals coarse breath sounds, particularly in the bilateral mid and lower fields with associated rales and expiratory wheezing.  Despite dosing of Lasix over 1 hour ago patient has not exhibited significant diuretic response nor has she had significant improvement in tachypnea.  Patient remains on nonrebreather mask.  ABG reveals pH of 7.48, PCO2 of 33.5 and PO2 of 61.8.  We will increase rate of nitroglycerin infusion from 10 mics per minute to 15 mics per minute to assist with afterload reduction.  We will additionally place patient on a trial of BiPAP therapy.  The patient is able to tolerate this we will repeat ABG later this morning.  Kristin [name]

## 2020-08-23 NOTE — Progress Notes (Signed)
[facility]  PROGRESS NOTE  Kristin [name] KWI:[id number] DOB: 08-21-1941 DOA: 08/18/2020 PCP: Caren Macadam, MD   Brief HPI:   79 year old female with history of chronic back pain, hypertension, diabetes mellitus type 2 came to ED with complaints of bilateral lower extremity weakness and worsening lower back pain.  Patient had an MRI on 04/13/2020 which showed advanced lumbar facet arthrosis with multilevel degenerative anterolisthesis, severe spinal stenosis at L2-3 and L3-4.  Mild to moderate spinal stenosis at L4-5.  MRI ordered during this admission showed similar changes.  Neurosurgery was consulted and surgical intervention was recommended for possible phlegmon at L3-4.  No evidence of infection was found during lumbar laminectomy.  Patient has been receiving IV antibiotics.  Patient developed acute hypoxemic respiratory failure on 08/22/2020, likely from pulmonary edema.  Cardiology was consulted.    Subjective   Patient developed worsening shortness of breath last night, she was given Lasix 40 mg IV and started on BiPAP.   Assessment/Plan:     1. SIRS-patient presents with fever, tachycardia, tachypnea.  No clear source of infection.  No evidence of infection of lumbar spine or knee joint infection.  Patient was started on vancomycin and cefepime, vancomycin has been discontinued as of 08/21/2020.  Continue with IV cefepime at this time.  No evidence found during laminectomy and decompression.  Blood culture showed no growth.  Urine culture showed insignificant growth.  Synovial fluid right knee showed no growth.  CT abdomen was negative for infection.   2. L2 -L4 severe lumbar stenosis-patient presented with lower extremity pain and numbness.  Underwent bilateral L2, L3, partial L4 laminectomy for decompression of neural elements on 08/19/2020.  No evidence of infection was found.  PT evaluation recommended home health PT.  Patient is stable for discharge from neurosurgical  standpoint. 3. Acute hypoxemic respiratory failure-secondary pulmonary edema.  BNP was elevated.  Cardiology was consulted.  Patient started on IV Lasix.  Continue Lasix 40 mg IV every 8 hours. 4. NSTEMI-troponin was elevated at 27,000, patient started on low-dose heparin with PTT goal 50-60.  No bolus.  No aspirin for now due to increased risk of bleeding.  Patient currently on heparin, IV nitroglycerin for pulmonary edema, statin.  Cardiology planning to take patient for left heart cath.  Follow cardiology recommendations. 5. Atrial fibrillation, new onset-patient currently on IV heparin.  Continue amiodarone GTT. CHA2DS2VASc score is 5, continue anticoagulation. 6. Bilateral knee pain-pseudogout.  Patient underwent arthrocentesis of right knee.  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  Orthopedics following.  Patient underwent injection of Depo-Medrol into the right knee.  Continue Voltaren gel. 7. Hypertension-continue Coreg, clonidine patch, as needed hydralazine. 8. Urine retention-Foley catheter in place. 9. Acute kidney injury-creatinine is elevated at 1.18.  Likely from diuresis with Lasix.  Continue to monitor BMP in a.m. 10. Hypokalemia-potassium was 2.9 secondary to diuresis.  Will replace potassium and follow BMP in am.     COVID-19 Labs  No results for input(s): DDIMER, FERRITIN, LDH, CRP in the last 72 hours.  Lab Results  Component Value Date   [place] NEGATIVE 08/18/2020     Scheduled medications:   . cholecalciferol  3,000 Units Oral Daily  . cloNIDine  0.1 mg Transdermal Weekly  . colchicine  0.3 mg Oral Daily  . diclofenac Sodium  2 g Topical QID  . furosemide  40 mg Intravenous Q8H  . metoprolol tartrate  25 mg Oral BID  . pantoprazole  40 mg Oral BID  . polyethylene glycol  17 g Oral BID  . potassium chloride  20 mEq Oral BID  . rosuvastatin  40 mg Oral QHS  . sacubitril-valsartan  1 tablet Oral BID  . senna-docusate  1 tablet Oral BID   . sodium chloride flush  10-40 mL Intracatheter Q12H  . sodium chloride flush  3 mL Intravenous Q12H  . sodium chloride flush  3 mL Intravenous Q12H  . sucralfate  1 g Oral TID WC & HS         CBG: Recent Labs  Lab 08/22/20 1142 08/22/20 1600 08/23/20 0813 08/23/20 1208 08/23/20 1634  GLUCAP 168* 192* 132* 141* 149*    SpO2: 94 % O2 Flow Rate (L/min): 8 L/min FiO2 (%): 80 %    CBC: Recent Labs  Lab 08/18/20 0200 08/18/20 0200 08/19/20 2201 08/19/20 2201 08/20/20 1429 08/20/20 1429 08/21/20 0237 08/22/20 1145 08/23/20 1303 08/23/20 1309 08/23/20 1312  WBC 10.8*  --  13.7*  --  15.1*  --  14.5* 18.7*  --   --   --   NEUTROABS 7.0  --   --   --   --   --   --   --   --   --   --   HGB 11.7*   < > 10.4*   < > 8.9*   < > 9.0* 9.5* 9.5* 9.2* 9.2*  HCT 36.9   < > 32.0*   < > 28.2*   < > 28.3* 30.2* 28.0* 27.0* 27.0*  MCV 93.2  --  90.9  --  91.3  --  90.7 90.1  --   --   --   PLT 291  --  287  --  259  --  303 372  --   --   --    < > = values in this interval not displayed.    Basic Metabolic Panel: Recent Labs  Lab 08/21/20 0237 08/21/20 0237 08/22/20 0811 08/22/20 0811 08/22/20 1652 08/22/20 1652 08/22/20 1759 08/23/20 0056 08/23/20 1303 08/23/20 1309 08/23/20 1312  NA 134*   < > 132*   < > 131*   < > 130* 130* 132* 134* 135  K 4.2   < > 3.9   < > 3.7   < > 3.5 3.6 3.1* 3.0* 2.9*  CL 98  --  97*  --  94*  --  94* 95*  --   --   --   CO2 25  --  24  --  24  --  21* 22  --   --   --   GLUCOSE 162*  --  162*  --  185*  --  178* 190*  --   --   --   BUN 35*  --  30*  --  33*  --  34* 35*  --   --   --   CREATININE 1.30*  --  1.06*  --  1.17*  --  1.13* 1.18*  --   --   --   CALCIUM 8.9  --  8.8*  --  9.2  --  9.0 8.6*  --   --   --   MG  --   --   --   --   --   --  2.0  --   --   --   --    < > = values in this interval not displayed.     Liver Function Tests: Recent Labs  Lab 08/18/20  0200 08/22/20 1759  AST 19 190*  ALT 16 66*  ALKPHOS  79 79  BILITOT 0.4 0.5  PROT 8.0 7.2  ALBUMIN 3.9 2.8*     Antibiotics: Anti-infectives (From admission, onward)   Start     Dose/Rate Route Frequency Ordered Stop   08/20/20 0400  vancomycin (VANCOREADY) IVPB 750 mg/150 mL  Status:  Discontinued        750 mg 150 mL/hr over 60 Minutes Intravenous Every 12 hours 08/19/20 1005 08/21/20 1101   08/19/20 1030  ceFEPIme (MAXIPIME) 2 g in sodium chloride 0.9 % 100 mL IVPB        2 g 200 mL/hr over 30 Minutes Intravenous Every 12 hours 08/19/20 1005     08/19/20 1030  vancomycin (VANCOREADY) IVPB 1750 mg/350 mL        1,750 mg 175 mL/hr over 120 Minutes Intravenous  Once 08/19/20 1005 08/19/20 1907   08/19/20 1030  ceFAZolin (ANCEF) IVPB 2g/100 mL premix        2 g 200 mL/hr over 30 Minutes Intravenous  Once 08/19/20 1019 08/19/20 1120   08/19/20 1021  ceFAZolin (ANCEF) 2-4 GM/100ML-% IVPB       Note to Pharmacy: Henrine Screws   : cabinet override      08/19/20 1021 08/19/20 1124   08/18/20 0515  ceFEPIme (MAXIPIME) 2 g in sodium chloride 0.9 % 100 mL IVPB        2 g 200 mL/hr over 30 Minutes Intravenous  Once 08/18/20 0504 08/18/20 0612   08/18/20 0515  metroNIDAZOLE (FLAGYL) IVPB 500 mg        500 mg 100 mL/hr over 60 Minutes Intravenous  Once 08/18/20 0504 08/18/20 0705   08/18/20 0515  vancomycin (VANCOCIN) IVPB 1000 mg/200 mL premix  Status:  Discontinued        1,000 mg 200 mL/hr over 60 Minutes Intravenous  Once 08/18/20 0504 08/18/20 0507   08/18/20 0515  vancomycin (VANCOREADY) IVPB 1750 mg/350 mL        1,750 mg 175 mL/hr over 120 Minutes Intravenous  Once 08/18/20 0507 08/18/20 1958       DVT prophylaxis: SCDs  Code Status: Full code  Family Communication: No family at bedside    Status is: Inpatient  Dispo: The patient is from: Home              Anticipated d/c is to: Skilled nursing facility              Anticipated d/c date is: 08/28/2020              Patient currently not medically stable for  discharge  Barrier to discharge-ongoing treatment for acute hypoxemic respiratory failure, pulmonary edema     Consultants:  Neurosurgery  Orthopedics   Objective   Vitals:   08/23/20 1345 08/23/20 1349 08/23/20 1400 08/23/20 1441  BP: (!) 137/92   133/88  Pulse: 90 (!) 0 85 92  Resp: (!) 31 (!) 0 17   Temp:      TempSrc:      SpO2: 100% (!) 0% 94%   Weight:      Height:        Intake/Output Summary (Last 24 hours) at 08/23/2020 1659 Last data filed at 08/23/2020 0141 Gross per 24 hour  Intake --  Output 600 ml  Net -600 ml    09/13 1901 - 09/15 0700 In: 3870.1 [P.O.:860; I.V.:1710.1] Out: 2375 [Urine:2375]  Filed Weights   08/18/20 2354 08/20/20  2774  Weight: 89.2 kg 93.3 kg    Physical Examination:    General: Appears in no acute distress  Cardiovascular: S1-S2, regular, no murmur auscultated  Respiratory: Decreased breath sounds bilaterally at lung bases  Abdomen: Abdomen is soft, nontender, no organomegaly  Extremities: No edema in the lower extremities  Neurologic: Cranial nerves II through XII grossly intact, no focal deficit noted    Data Reviewed:   Recent Results (from the past 240 hour(s))  Culture, blood (Routine X 2) w Reflex to ID Panel     Status: None   Collection Time: 08/18/20 12:19 AM   Specimen: BLOOD RIGHT FOREARM  Result Value Ref Range Status   Specimen Description   Final    BLOOD RIGHT FOREARM Performed at [facility], [address], [place], [place] [address]    Special Requests   Final    BOTTLES DRAWN AEROBIC AND ANAEROBIC Blood Culture adequate volume Performed at [facility] [address], [place], [place] [address]    Culture   Final    NO GROWTH 5 DAYS Performed at [facility], [place] [address] [place], [place] [address]    Report Status 08/23/2020 FINAL  Final  SARS Coronavirus 2 by RT PCR (hospital order, performed in [facility] hospital lab) Nasopharyngeal  Nasopharyngeal Swab     Status: None   Collection Time: 08/18/20 12:19 AM   Specimen: Nasopharyngeal Swab  Result Value Ref Range Status   SARS Coronavirus 2 NEGATIVE NEGATIVE Final    Comment: (NOTE) SARS-CoV-2 target nucleic acids are NOT DETECTED.  The SARS-CoV-2 RNA is generally detectable in upper and lower respiratory specimens during the acute phase of infection. The lowest concentration of SARS-CoV-2 viral copies this assay can detect is 250 copies / mL. A negative result does not preclude SARS-CoV-2 infection and should not be used as the sole basis for treatment or other patient management decisions.  A negative result may occur with improper specimen collection / handling, submission of specimen other than nasopharyngeal swab, presence of viral mutation(s) within the areas targeted by this assay, and inadequate number of viral copies (<250 copies / mL). A negative result must be combined with clinical observations, patient history, and epidemiological information.  Fact Sheet for Patients:   StrictlyIdeas.no  Fact Sheet for Healthcare Providers: BankingDealers.co.za  This test is not yet approved or  cleared by the [place] FDA and has been authorized for detection and/or diagnosis of SARS-CoV-2 by FDA under an Emergency Use Authorization (EUA).  This EUA will remain in effect (meaning this test can be used) for the duration of the COVID-19 declaration under Section 564(b)(1) of the Act, 21 U.S.C. section 360bbb-3(b)(1), unless the authorization is terminated or revoked sooner.  Performed at [facility], [place] [address] [place], [place] [address]   Culture, blood (Routine X 2) w Reflex to ID Panel     Status: None   Collection Time: 08/18/20  1:56 AM   Specimen: BLOOD LEFT FOREARM  Result Value Ref Range Status   Specimen Description   Final    BLOOD LEFT FOREARM Performed at [facility], [place] [address] [place], [place] [address]    Special Requests   Final    BOTTLES DRAWN AEROBIC AND ANAEROBIC Blood Culture results may not be optimal due to an inadequate volume of blood received in culture bottles Performed at [facility] [address] [place], [place] [address]  Culture   Final    NO GROWTH 5 DAYS Performed at [facility], [place] [address], [place], [place] [address]    Report Status 08/23/2020 FINAL  Final  Urine Culture     Status: Abnormal   Collection Time: 08/18/20  4:07 AM   Specimen: Urine, Catheterized  Result Value Ref Range Status   Specimen Description   Final    URINE, CATHETERIZED Performed at [facility] [address] [place], [place] [address]    Special Requests   Final    NONE Performed at [facility], [place] [address] [place], [place] [address]    Culture (A)  Final    <10,000 COLONIES/mL INSIGNIFICANT GROWTH Performed at [facility] [address] [place], [place] [address]    Report Status 08/19/2020 FINAL  Final  Surgical pcr screen     Status: None   Collection Time: 08/19/20  9:45 AM   Specimen: Nasal Mucosa; Nasal Swab  Result Value Ref Range Status   MRSA, PCR NEGATIVE NEGATIVE Final   Staphylococcus aureus NEGATIVE NEGATIVE Final    Comment: (NOTE) The Xpert SA Assay (FDA approved for NASAL specimens in patients 59 years of age and older), is one component of a comprehensive surveillance program. It is not intended to diagnose infection nor to guide or monitor treatment. Performed at [facility], [place] [address] [place], [place] [address]   Body fluid culture     Status: None   Collection Time: 08/19/20  2:50 PM   Specimen: Body Fluid  Result Value Ref Range Status   Specimen Description FLUID  Final   Special Requests SYNOVIAL RIGHT KNEE  Final   Gram Stain   Final    MODERATE WBC PRESENT, PREDOMINANTLY PMN NO ORGANISMS  SEEN    Culture   Final    NO GROWTH 3 DAYS Performed at [facility], [address], [place], [place] [address]    Report Status 08/23/2020 FINAL  Final    No results for input(s): LIPASE, AMYLASE in the last 168 hours. No results for input(s): AMMONIA in the last 168 hours.  Cardiac Enzymes: Recent Labs  Lab 08/18/20 0200  CKTOTAL 54   BNP (last 3 results) Recent Labs    08/22/20 0557 08/22/20 1759 08/23/20 0056  BNP 377.8* 833.1* 1,088.7*     Studies:  CT ABDOMEN PELVIS WO CONTRAST  Result Date: 08/22/2020 CLINICAL DATA:  Fever of unknown origin EXAM: CT ABDOMEN AND PELVIS WITHOUT CONTRAST TECHNIQUE: Multidetector CT imaging of the abdomen and pelvis was performed following the standard protocol without IV contrast. COMPARISON:  CT 03/31/2020 FINDINGS: Lower chest: Small bilateral pleural effusions. Ground-glass density within the bilateral lower lobes with partial consolidation. Borderline cardiomegaly. Hepatobiliary: No focal liver abnormality is seen. No gallstones, gallbladder wall thickening, or biliary dilatation. Pancreas: Unremarkable. No pancreatic ductal dilatation or surrounding inflammatory changes. Spleen: Normal in size without focal abnormality. Adrenals/Urinary Tract: Nodular thickening of the adrenal glands without dominant mass. Kidneys show no hydronephrosis. Cortical scarring in the mid left kidney. The urinary bladder is unremarkable. Stomach/Bowel: The stomach is nonenlarged. No dilated small bowel. No bowel wall thickening. Negative appendix. Large amount of stool in the colon. Vascular/Lymphatic: Moderate aortic atherosclerosis without aneurysm. No suspicious nodes. Reproductive: Lobulated uterus with multiple fibroids some of which are calcified. 1.8 cm benign-appearing left adnexal cyst without change, no further workup recommended. Other: Negative for free air or free fluid. Fat containing  umbilical hernia. Musculoskeletal: No acute osseous  abnormality. Post laminectomy changes L2-L3 through L3-L4. Degenerative changes at multiple levels. IMPRESSION: 1. No CT evidence for acute intra-abdominal or pelvic abnormality. 2. Small bilateral pleural effusions. Partial consolidations within the bilateral lower lobes which may reflect atelectasis or pneumonia. Ground-glass density in the bilateral lower lobes which may be secondary to infection or edema. 3. Fibroid uterus. 4. Fat containing umbilical hernia. Aortic Atherosclerosis (ICD10-I70.0). Electronically Signed   By: Donavan Foil M.D.   On: 08/22/2020 02:03   DG Chest 1 View  Result Date: 08/23/2020 CLINICAL DATA:  Pulmonary edema EXAM: CHEST  1 VIEW COMPARISON:  08/22/2020 FINDINGS: Mild bilateral interstitial opacities. Small left pleural effusion. Cardiomediastinal contours are normal. Increased density at the right hilum is likely due to pulmonary vascular congestion given the relatively normal appearance on 08/18/2020. IMPRESSION: Mild pulmonary edema and small left pleural effusion. Electronically Signed   By: Ulyses Jarred M.D.   On: 08/23/2020 01:43   DG Chest 1 View  Result Date: 08/22/2020 CLINICAL DATA:  Hypoxia EXAM: CHEST  1 VIEW COMPARISON:  08/18/2020 FINDINGS: Marked worsening of bilateral airspace opacities in a pattern most suggestive of pulmonary edema. No pneumothorax or pleural effusion. Mild cardiomegaly. IMPRESSION: Marked worsening of bilateral airspace opacities, most suggestive of pulmonary edema. Electronically Signed   By: Ulyses Jarred M.D.   On: 08/22/2020 02:00   CARDIAC CATHETERIZATION  Result Date: 08/23/2020 Left and right heart catheterization 08/23/2020: RA 13/15, mean 12, RA saturation 56%; RV 51/8, EDP 12 mmHg; PA 54/26, mean 38 mmHg, PA saturation 45%; PW 21/21, mean 20 mmHg, aortic saturation 97%. Cardiac output 4.96, cardiac index 2.51.  SVR elevated at 1258.  PVR mildly elevated at 290.  QP/QS 0.79. Left main: Mild disease. LAD: Proximal 80% stenosis  followed by 100% occlusion.  Before the occlusion, at the subtotal site, there is a large diagonal that comes off and has secondary branches.  Apical LAD is barely visualized with minimal amount of collaterals.  Thrombus is evident at the site of occlusion. Circumflex: Large vessel, moderate-sized OM1 and moderate-sized OM 2.  Minimal disease. RCA: Ostial 80 to 90% stenosis.  Mild disease in the remaining of the vessel, PL branch has a 50% stenosis of the proximal and ostial segment.  Severe dampening was evident with engagement of 5 French diagnostic catheter. LV: EDP mildly elevated at 15- 18 mmHg.  No pressure gradient across the aortic valve.  LVEF 30 to 35% with entire anterior, anterolateral, apical and apical inferior akinesis.  No significant MR. Impression: Patient's presentation is consistent with acute systolic heart failure from recent acute myocardial infarction.  Patient is postop spine surgery and not a candidate for anticoagulation with full dose or with antiplatelet therapy for now in view of risk of development of spine hematoma and paraparesis.  Also the infarct appears to have been completed.  As her EDP is only 15 and wedge although shows mild elevation at 20, she is saturating well and heart rate is much better improved and controlled with regard to atrial fibrillation.  Hence I did not proceed with implantation of intra-aortic balloon pump.  Will be aggressive with fluid management on the floor and watch her carefully.  Have a low threshold for intubation if necessary.  I suspect she probably will recover as it has been greater than 24 hours since her infarct.  DG Chest Port 1 View  Result Date: 08/22/2020 CLINICAL DATA:  Sudden onset shortness of breath. Body aches and  fever. EXAM: PORTABLE CHEST 1 VIEW COMPARISON:  08/22/2020 FINDINGS: Low lung volumes are present, causing crowding of the pulmonary vasculature. Indistinct pulmonary vasculature with perihilar and basilar airspace  opacities. The peripheral interstitial accentuation and Kerley B lines suggestive of interstitial edema have mildly improved, but the basilar airspace opacities are mildly worsened. Thoracic spondylosis.  Heart size within normal limits. IMPRESSION: 1. Bilateral interstitial and airspace opacities potentially from edema or atypical pneumonia. Mildly worsened basilar airspace opacities although the interstitial accentuation has improved. Electronically Signed   By: Van Clines M.D.   On: 08/22/2020 18:22   ECHOCARDIOGRAM COMPLETE  Result Date: 08/22/2020    ECHOCARDIOGRAM REPORT   Patient Name:   JORDAYN MINK Date of Exam: 08/22/2020 Medical Rec #:  [id number]        Height:       64.0 in Accession #:    [id number]       Weight:       205.7 lb Date of Birth:  26-Feb-1941         BSA:          1.980 m Patient Age:    27 years         BP:           138/104 mmHg Patient Gender: F                HR:           105 bpm. Exam Location:  Inpatient Procedure: 2D Echo, Cardiac Doppler, Color Doppler and Intracardiac            Opacification Agent Indications:     Dyspnea  History:         Patient has no prior history of Echocardiogram examinations.                  Risk Factors:Hypertension.  Sonographer:     Clayton Lefort RDCS (AE) Referring Phys:  [id number] [facility] A [facility] Diagnosing Phys: Rex Kras DO IMPRESSIONS  1. Left ventricular ejection fraction, by estimation, is 30 to 35%. The left ventricle has moderately decreased function. The left ventricle demonstrates regional wall motion abnormalities. Hyokinesis of base to mid anterior wall, base to mid anterospetal wall, apical septal, and apex. There is moderate left ventricular hypertrophy. Diastolic function is not assessed due to atrial fibrillation. No apparent left ventricular thrombus.  2. Right ventricular systolic function is normal. The right ventricular size is normal. There is moderately elevated pulmonary artery systolic pressure. The estimated right  ventricular systolic pressure is 09.3 mmHg.  3. The mitral valve is normal in structure. Mild mitral valve regurgitation. No evidence of mitral stenosis.  4. The aortic valve is normal in structure. Aortic valve regurgitation is not visualized. Mild aortic valve sclerosis is present, with no evidence of aortic valve stenosis.  5. The inferior vena cava is dilated in size with >50% respiratory variability, suggesting right atrial pressure of 8 mmHg. FINDINGS  Left Ventricle: Left ventricular ejection fraction, by estimation, is 30 to 35%. The left ventricle has moderately decreased function. The left ventricle demonstrates regional wall motion abnormalities. Hyokinesis of base to mid anterior wall, base to mid anterospetal wall, apical septal, and apex. Definity contrast agent was given IV to delineate the left ventricular endocardial borders. The left ventricular internal cavity size was normal in size. There is moderate left ventricular hypertrophy. Left  ventricular diastolic function could not be evaluated. Right Ventricle: The right ventricular size is normal. Right vetricular wall thickness  was not well visualized. Right ventricular systolic function is normal. There is moderately elevated pulmonary artery systolic pressure. The tricuspid regurgitant velocity is 4.07 m/s, and with an assumed right atrial pressure of 8 mmHg, the estimated right ventricular systolic pressure is 37.1 mmHg. Left Atrium: Left atrial size was normal in size. Right Atrium: Right atrial size was normal in size. Pericardium: There is no evidence of pericardial effusion. Mitral Valve: The mitral valve is normal in structure. Mild mitral annular calcification. Mild mitral valve regurgitation. No evidence of mitral valve stenosis. Tricuspid Valve: The tricuspid valve is normal in structure. Tricuspid valve regurgitation is mild . No evidence of tricuspid stenosis. Aortic Valve: The aortic valve is normal in structure. Aortic valve  regurgitation is not visualized. Mild aortic valve sclerosis is present, with no evidence of aortic valve stenosis. Aortic valve mean gradient measures 2.0 mmHg. Aortic valve peak gradient measures 3.9 mmHg. Aortic valve area, by VTI measures 1.72 cm. Pulmonic Valve: The pulmonic valve was grossly normal. Pulmonic valve regurgitation is trivial. Aorta: The aortic root and ascending aorta are structurally normal, with no evidence of dilitation. Venous: The inferior vena cava is dilated in size with greater than 50% respiratory variability, suggesting right atrial pressure of 8 mmHg. IAS/Shunts: The atrial septum is grossly normal.  LEFT VENTRICLE PLAX 2D LVIDd:         4.00 cm LVIDs:         2.50 cm LV PW:         1.40 cm LV IVS:        1.30 cm LVOT diam:     1.80 cm LV SV:         26 LV SV Index:   13 LVOT Area:     2.54 cm  LV Volumes (MOD) LV vol d, MOD A2C: 107.0 ml LV vol d, MOD A4C: 89.0 ml LV vol s, MOD A2C: 78.5 ml LV vol s, MOD A4C: 61.0 ml LV SV MOD A2C:     28.5 ml LV SV MOD A4C:     89.0 ml LV SV MOD BP:      31.6 ml RIGHT VENTRICLE          IVC RV Basal diam:  2.60 cm  IVC diam: 2.00 cm LEFT ATRIUM             Index       RIGHT ATRIUM          Index LA diam:        3.70 cm 1.87 cm/m  RA Area:     9.43 cm LA Vol (A2C):   63.2 ml 31.92 ml/m RA Volume:   18.60 ml 9.39 ml/m LA Vol (A4C):   61.3 ml 30.96 ml/m LA Biplane Vol: 63.1 ml 31.87 ml/m  AORTIC VALVE AV Area (Vmax):    1.82 cm AV Area (Vmean):   1.92 cm AV Area (VTI):     1.72 cm AV Vmax:           98.50 cm/s AV Vmean:          70.700 cm/s AV VTI:            0.151 m AV Peak Grad:      3.9 mmHg AV Mean Grad:      2.0 mmHg LVOT Vmax:         70.57 cm/s LVOT Vmean:        53.467 cm/s LVOT VTI:  0.102 m LVOT/AV VTI ratio: 0.67  AORTA Ao Root diam: 3.60 cm Ao Asc diam:  3.40 cm TRICUSPID VALVE TR Peak grad:   66.3 mmHg TR Vmax:        407.00 cm/s  SHUNTS Systemic VTI:  0.10 m Systemic Diam: 1.80 cm Sunit Tolia DO Electronically signed by  Rex Kras DO Signature Date/Time: 08/22/2020/4:31:42 PM    Final        Oswald Hillock   [facility] If 7PM-7AM, please contact night-coverage at [email or web address], Office  [phone]   08/23/2020, 4:59 PM  LOS: 5 days

## 2020-08-23 NOTE — Progress Notes (Signed)
ANTICOAGULATION CONSULT NOTE   Pharmacy Consult for Heparin Indication: ACS  No Known Allergies  Patient Measurements: Height: 5\' 4"  (162.6 cm) Weight: 93.3 kg (205 lb 11 oz) IBW/kg (Calculated) : 54.7 Heparin Dosing Weight: 75 kg  Vital Signs: Temp: 98.8 F (37.1 C) (09/15 0111) Temp Source: Axillary (09/15 0111) BP: 145/97 (09/15 0111) Pulse Rate: 92 (09/15 0111)  Labs: Recent Labs    08/20/20 1429 08/20/20 1429 08/21/20 0237 08/22/20 0811 08/22/20 1145 08/22/20 1145 08/22/20 1323 08/22/20 1652 08/22/20 1759 08/22/20 1830 08/23/20 0056  HGB 8.9*   < > 9.0*  --  9.5*  --   --   --   --   --   --   HCT 28.2*  --  28.3*  --  30.2*  --   --   --   --   --   --   PLT 259  --  303  --  372  --   --   --   --   --   --   APTT  --   --   --   --   --   --   --   --   --   --  89*  CREATININE 1.62*   < > 1.30*   < >  --   --   --  1.17* 1.13*  --  1.18*  TROPONINIHS  --   --   --   --  >27,000*   < > >27,000* >27,000*  --  >27,000*  --    < > = values in this interval not displayed.    Estimated Creatinine Clearance: 42.1 mL/min (A) (by C-G formula based on SCr of 1.18 mg/dL (H)).  Assessment: 79 yo female with new onset ACS per telephone conversation with Dr. Terri Skains of Cardiology. Patient has had a recent Neurosurgical procedure, therefore Neurosurgery is requesting no boluses and a tight APTT goal of 50-60. Last sq heparin dose 1025.   PTT 89 sec (supratherapeutic) on gtt at 1150 units/hr. No bleeding noted.  Goal of Therapy:  APTT 50-60 per neurosurgery Monitor platelets by anticoagulation protocol: Yes   Plan:  Decrease heparin drip to 900 units/hr  APTT in 8 hours  Sherlon Handing, PharmD, BCPS Please see amion for complete clinical pharmacist phone list 08/23/2020,1:31 AM

## 2020-08-23 NOTE — Significant Event (Signed)
Rapid Response Event Note   Reason for Call :  Respiratory distress  Initial Focused Assessment:  Pt laying in bed with eyes open, mildy in distress.  Pt is alert and able to answer questions, though confused. Per RN, this confusion has worsened t/o the night.  She denies chest pain but does c/o SOB.  Lungs with scattered crackles t/o. Skin cool to touch. T-98.6, HR-86, BP-153/92, RR-32, SpO2-93% on NRB.   Pt was given 40mg  IV lasix at 2147 tonight. Per RN, she only diuresed 400cc.    Interventions:  EKG-Afib PCXR ABG 40mg  lasix IV x 1  Plan of Care:  Lasix given prior to my arrival. Await lasix response as well as ABG/PCXR results-relay these to MD. If distress doesn't get better with diuresis and/or PCXR/ABG results are abnormal, pt may need support with bipap. Continue to monitor closely. Call RRT if further assistance needed.    Event Summary:   MD Notified: Dr. Cyd Silence notified PTA RRT Call [place] End Time:0108  Dillard Essex, RN

## 2020-08-23 NOTE — Progress Notes (Signed)
Pt transported on BIPAP to cath lab. No complications noted.

## 2020-08-23 NOTE — Progress Notes (Signed)
   Providing Compassionate, Quality Care - Together  NEUROSURGERY PROGRESS NOTE   S: found to have elevated trop/pulm edema yesterday, started on heparin drip with no bolus  O: EXAM:  BP 133/88   Pulse 92   Temp 98.7 F (37.1 C) (Axillary)   Resp 17   Ht 5\' 4"  (1.626 m)   Wt 93.3 kg   SpO2 94%   BMI 35.31 kg/m   Awake, alert bipap on 5/5 BUE/BLE MAE equally  ASSESSMENT:  79 y.o. female with  1.L2-L4 severe lumbar stenosis 2.Lower extremity pain and weakness  S/p L2-4 lami on 08/19/2020  PLAN: -pt/ot - pain control - heparin drip for now, hoping to wait until 1 week postop for dual antiplatelet if needed from cards standpoint, will d/w Dr. Einar Gip -mon for hematoma/LE weakness    Thank you for allowing me to participate in this patient's care.  Please do not hesitate to call with questions or concerns.   Elwin Sleight, [facility] Cell: [phone]

## 2020-08-24 ENCOUNTER — Encounter (HOSPITAL_COMMUNITY): Payer: Self-pay | Admitting: Cardiology

## 2020-08-24 DIAGNOSIS — N179 Acute kidney failure, unspecified: Secondary | ICD-10-CM

## 2020-08-24 DIAGNOSIS — I2510 Atherosclerotic heart disease of native coronary artery without angina pectoris: Secondary | ICD-10-CM

## 2020-08-24 LAB — GLUCOSE, CAPILLARY
Glucose-Capillary: 152 mg/dL — ABNORMAL HIGH (ref 70–99)
Glucose-Capillary: 187 mg/dL — ABNORMAL HIGH (ref 70–99)
Glucose-Capillary: 123 mg/dL — ABNORMAL HIGH (ref 70–99)
Glucose-Capillary: 226 mg/dL — ABNORMAL HIGH (ref 70–99)

## 2020-08-24 LAB — CBC
HCT: 29.3 % — ABNORMAL LOW (ref 36.0–46.0)
Hemoglobin: 9.4 g/dL — ABNORMAL LOW (ref 12.0–15.0)
MCH: 28.6 pg (ref 26.0–34.0)
MCHC: 32.1 g/dL (ref 30.0–36.0)
MCV: 89.1 fL (ref 80.0–100.0)
Platelets: 305 10*3/uL (ref 150–400)
RBC: 3.29 MIL/uL — ABNORMAL LOW (ref 3.87–5.11)
RDW: 13.2 % (ref 11.5–15.5)
WBC: 11.2 10*3/uL — ABNORMAL HIGH (ref 4.0–10.5)
nRBC: 0 % (ref 0.0–0.2)

## 2020-08-24 LAB — BASIC METABOLIC PANEL
Anion gap: 10 (ref 5–15)
BUN: 37 mg/dL — ABNORMAL HIGH (ref 8–23)
CO2: 28 mmol/L (ref 22–32)
Calcium: 8.4 mg/dL — ABNORMAL LOW (ref 8.9–10.3)
Chloride: 94 mmol/L — ABNORMAL LOW (ref 98–111)
Creatinine, Ser: 1.66 mg/dL — ABNORMAL HIGH (ref 0.44–1.00)
GFR calc Af Amer: 33 mL/min — ABNORMAL LOW (ref >60)
GFR calc non Af Amer: 29 mL/min — ABNORMAL LOW (ref >60)
Glucose, Bld: 189 mg/dL — ABNORMAL HIGH (ref 70–99)
Potassium: 3.5 mmol/L (ref 3.5–5.1)
Sodium: 132 mmol/L — ABNORMAL LOW (ref 135–145)

## 2020-08-24 LAB — APTT
aPTT: 34 seconds (ref 24–36)
aPTT: 171 seconds (ref 24–36)
aPTT: 70 seconds — ABNORMAL HIGH (ref 24–36)
aPTT: 71 seconds — ABNORMAL HIGH (ref 24–36)

## 2020-08-24 LAB — PROCALCITONIN: Procalcitonin: 0.37 ng/mL

## 2020-08-24 LAB — BRAIN NATRIURETIC PEPTIDE: B Natriuretic Peptide: 704.9 pg/mL — ABNORMAL HIGH (ref 0.0–100.0)

## 2020-08-24 MED ORDER — ISOSORB DINITRATE-HYDRALAZINE 20-37.5 MG PO TABS
1.0000 | ORAL_TABLET | Freq: Three times a day (TID) | ORAL | Status: DC
  Administered 2020-08-24 – 2020-08-26 (×5): 1 via ORAL
  Filled 2020-08-24 (×5): qty 1

## 2020-08-24 MED ORDER — BUMETANIDE 0.25 MG/ML IJ SOLN
1.0000 mg | Freq: Two times a day (BID) | INTRAMUSCULAR | Status: DC
  Administered 2020-08-24 – 2020-08-29 (×9): 1 mg via INTRAVENOUS
  Filled 2020-08-24 (×9): qty 4

## 2020-08-24 MED ORDER — AMIODARONE HCL 200 MG PO TABS
200.0000 mg | ORAL_TABLET | Freq: Every day | ORAL | Status: DC
  Administered 2020-08-24 – 2020-08-26 (×3): 200 mg via ORAL
  Filled 2020-08-24 (×3): qty 1

## 2020-08-24 NOTE — Progress Notes (Signed)
Spoke with patient's nurse regarding PIV consult. This patient has had multiple PIV starts in 6 days. Educated nurse on vein preservation. Instructed to notify [facility] with any other needs or questions. VU. Fran Lowes, RN [facility]

## 2020-08-24 NOTE — Progress Notes (Signed)
   Providing Compassionate, Quality Care - Together  NEUROSURGERY PROGRESS NOTE   S: No issues overnight. Feeling much improved this am, feels as though her legs are stronger since surgery  O: EXAM:  BP 104/69   Pulse 80   Temp 97.7 F (36.5 C) (Oral)   Resp 16   Ht 5\' 4"  (1.626 m)   Wt 91.9 kg   SpO2 100%   BMI 34.78 kg/m   Awake, alert, oriented  Speech fluent, appropriate  CNs grossly intact  5/5 BUE/BLE  Incision c/d/i, no fluctuance, mildly tender to palpation  ASSESSMENT:  79 y.o. female with  1.L2-L4 severe lumbar stenosis 2.Lower extremity pain and weakness  S/p L2-4 lami on 08/19/2020  PLAN: -pt/ot - pain control - heparin drip for now, discussed with Dr. Einar Gip about the need for stent/dual antiplatelet therapy.  Currently she is cardiovascularly stable therefore we will wait until Monday or Tuesday which will be approximately postop day 9 or 10 for her intervention. -Continue heparin drip with a goal PTT 50-60 -mon for hematoma/LE weakness  Thank you for allowing me to participate in this patient's care.  Please do not hesitate to call with questions or concerns.   Elwin Sleight, [facility] Cell: [phone]

## 2020-08-24 NOTE — Progress Notes (Signed)
ANTICOAGULATION CONSULT NOTE   Pharmacy Consult for Heparin Indication: ACS/afib  No Known Allergies  Patient Measurements: Height: 5\' 4"  (162.6 cm) Weight: 91.9 kg (202 lb 9.6 oz) IBW/kg (Calculated) : 54.7 Heparin Dosing Weight: 75 kg  Vital Signs: Temp: 98.6 F (37 C) (09/16 0500) Temp Source: Axillary (09/16 0500) BP: 100/76 (09/16 0500) Pulse Rate: 83 (09/16 0500)  Labs: Recent Labs    08/22/20 0811 08/22/20 1145 08/22/20 1145 08/22/20 1323 08/22/20 1652 08/22/20 1759 08/22/20 1830 08/23/20 0056 08/23/20 1000 08/23/20 1303 08/23/20 1303 08/23/20 1309 08/23/20 1312 08/24/20 0155  HGB  --  9.5*   < >  --   --   --   --   --   --  9.5*   < > 9.2* 9.2*  --   HCT  --  30.2*   < >  --   --   --   --   --   --  28.0*  --  27.0* 27.0*  --   PLT  --  372  --   --   --   --   --   --   --   --   --   --   --   --   APTT  --   --   --   --   --   --   --  89* 40*  --   --   --   --  34  CREATININE   < >  --   --   --  1.17* 1.13*  --  1.18*  --   --   --   --   --   --   TROPONINIHS  --  >27,000*   < > >27,000* >27,000*  --  >27,000*  --   --   --   --   --   --   --    < > = values in this interval not displayed.    Estimated Creatinine Clearance: 41.8 mL/min (A) (by C-G formula based on SCr of 1.18 mg/dL (H)).  Assessment: 79 yo female with new onset ACS per telephone conversation with Dr. Terri Skains of Cardiology. Patient has had a recent Neurosurgical procedure, therefore Neurosurgery is requesting no boluses and a tight APTT goal of 50-60.   Heparin restarted s/p cath 9/15. APTT continues low this morning and now above goal after rate adjustment. Aptt originally reported at 177 drawn from midline, stat repeat is more in line with recent trends at 81s. Heparin currently at 1100 units/hr, will reduce. No issues with line or bleeding reported per RN. Discussed with nursing, will pass off to nights to get peripheral stick on recheck if possible. CBC was stable overall.    Goal of Therapy:  APTT 50-60 sec per neurosurgery Monitor platelets by anticoagulation protocol: Yes   Plan:  Decrease heparin drip to 1000 units/hr  aPTT in 8 hours  Erin Hearing PharmD., BCPS Clinical Pharmacist 08/24/2020 7:21 AM

## 2020-08-24 NOTE — Progress Notes (Signed)
Physical Therapy Treatment Patient Details Name: Kristin [name] MRN: [id number] DOB: October 03, 1941 Today's Date: 08/24/2020    History of Present Illness This is an 79 year old female with a history of hypertension, chronic kidney disease, diabetes, chronic low back pain that presents with worsening low back pain and lower extremity weakness. Now s/p Lumbar Laminectomy for decompression; Also with knee pain, Ortho is addressing, considering injections. 08/22/20 developed acute decompensated systolic heart failure and pulmonary edema needing BiPAP.    PT Comments    Pt sitting up in chair on entry with daughter in room. Pt reports she would like to go to the bathroom and then return to bed. Pt limited in safe mobility by continued increased oxygen demand, as well as generalized weakness and decreased endurance. Pt is currently min guard for bed mobility, and transfers and min A for short distance ambulation with RW. Pt's daughter asked about safety of mother climbing 4 stairs to get in her home with only intermittent supervision.  Given currently level of mobility, PT recommending SNF level rehab at discharge.    Follow Up Recommendations  SNF     Equipment Recommendations  Rolling walker with 5" wheels;3in1 (PT);Other (comment) (TBD at next venue)       Precautions / Restrictions Precautions Precautions: Back Precaution Comments: educated on position change needed for back precautions Restrictions Weight Bearing Restrictions: No    Mobility  Bed Mobility Overal bed mobility: Needs Assistance Bed Mobility: Sit to Supine     Supine to sit: HOB elevated;Min guard     General bed mobility comments: min guard for safety with LE management back to bed   Transfers Overall transfer level: Needs assistance Equipment used: Rolling walker (2 wheeled) Transfers: Sit to/from Stand Sit to Stand: Min guard         General transfer comment: min guard for safety    Ambulation/Gait Ambulation/Gait assistance: Min guard;Min assist Gait Distance (Feet): 20 Feet Assistive device: Rolling walker (2 wheeled) Gait Pattern/deviations: Step-through pattern;Trunk flexed Gait velocity: 0.3 m/s Gait velocity interpretation: <1.31 ft/sec, indicative of household ambulator General Gait Details: min guard for safety with staight line ambulation, min A for navigating in bathroom          Balance Overall balance assessment: Needs assistance Sitting-balance support: Bilateral upper extremity supported;Feet supported Sitting balance-Leahy Scale: Good     Standing balance support: Bilateral upper extremity supported;During functional activity Standing balance-Leahy Scale: Poor Standing balance comment: Dependent on RW                            Cognition Arousal/Alertness: Awake/alert Behavior During Therapy: WFL for tasks assessed/performed   Area of Impairment: Safety/judgement                         Safety/Judgement: Decreased awareness of safety;Decreased awareness of deficits               General Comments General comments (skin integrity, edema, etc.): Daughter present during session, asking about pt ability to climb stairs. Pt on 8L O2 via HFNC on entry with SaO2 100%O2, with ambulation SaO2 dropped to 88%O2 with poor pleth form. rebounded to 98%O2 with return to bed. VSS       Pertinent Vitals/Pain Pain Assessment: Faces Faces Pain Scale: Hurts a little bit Pain Location: R knee and back initially, pt reports decrease in pain following mobility Pain Descriptors / Indicators: Grimacing;Guarding;Aching;Discomfort Pain Intervention(s): Limited activity within  patient's tolerance;Monitored during session;Repositioned           PT Goals (current goals can now be found in the care plan section) Acute Rehab PT Goals Patient Stated Goal: to get stonger PT Goal Formulation: With patient Time For Goal Achievement:  09/03/20 Potential to Achieve Goals: Good Progress towards PT goals: Progressing toward goals    Frequency    Min 5X/week      PT Plan Discharge plan needs to be updated       AM-PAC PT "6 Clicks" Mobility   Outcome Measure  Help needed turning from your back to your side while in a flat bed without using bedrails?: A Little Help needed moving from lying on your back to sitting on the side of a flat bed without using bedrails?: A Little Help needed moving to and from a bed to a chair (including a wheelchair)?: A Little Help needed standing up from a chair using your arms (e.g., wheelchair or bedside chair)?: A Little Help needed to walk in hospital room?: A Little Help needed climbing 3-5 steps with a railing? : A Little 6 Click Score: 18    End of Session Equipment Utilized During Treatment: Gait belt Activity Tolerance: Patient tolerated treatment well Patient left: with call bell/phone within reach;in bed;with nursing/sitter in room;with family/visitor present Nurse Communication: Mobility status PT Visit Diagnosis: Unsteadiness on feet (R26.81);Other abnormalities of gait and mobility (R26.89);Pain Pain - Right/Left: Right Pain - part of body: Knee     Time: 8338-2505 PT Time Calculation (min) (ACUTE ONLY): 23 min  Charges:  $Therapeutic Activity: 8-22 mins                     Tacey Dimaggio B. Migdalia Dk PT, DPT Acute Rehabilitation Services Pager [phone] Office [phone]    [place] 08/24/2020, 4:35 PM

## 2020-08-24 NOTE — Progress Notes (Signed)
[facility]  PROGRESS NOTE  Kristin [name] RCV:[id number] DOB: 1941/07/04 DOA: 08/18/2020 PCP: Caren Macadam, MD   Brief HPI:   79 year old female with history of chronic back pain, hypertension, diabetes mellitus type 2 came to ED with complaints of bilateral lower extremity weakness and worsening lower back pain.  Patient had an MRI on 04/13/2020 which showed advanced lumbar facet arthrosis with multilevel degenerative anterolisthesis, severe spinal stenosis at L2-3 and L3-4.  Mild to moderate spinal stenosis at L4-5.  MRI ordered during this admission showed similar changes.  Neurosurgery was consulted and surgical intervention was recommended for possible phlegmon at L3-4.  No evidence of infection was found during lumbar laminectomy.  Patient has been receiving IV antibiotics.  Patient developed acute hypoxemic respiratory failure on 08/22/2020, likely from pulmonary edema.  Cardiology was consulted.    Subjective   Patient seen and examined, denies chest pain or shortness of breath.  Currently not on BiPAP.   Assessment/Plan:     1. SIRS-patient presents with fever, tachycardia, tachypnea.  No clear source of infection.  No evidence of infection of lumbar spine or knee joint infection.  Patient was started on vancomycin and cefepime, vancomycin has been discontinued as of 08/21/2020.  Continue with IV cefepime at this time.  No evidence found during laminectomy and decompression.  Blood culture showed no growth.  Urine culture showed insignificant growth.  Synovial fluid right knee showed no growth.  CT abdomen was negative for infection.   2. L2 -L4 severe lumbar stenosis-patient presented with lower extremity pain and numbness.  Underwent bilateral L2, L3, partial L4 laminectomy for decompression of neural elements on 08/19/2020.  No evidence of infection was found.  PT evaluation recommended home health PT. patient is currently on heparin.  Monitor for hematoma/lower extremity  weakness. 3. Acute hypoxemic respiratory failure-secondary pulmonary edema.  BNP was elevated.  Cardiology was consulted.  Patient started on IV Lasix.  Continue Lasix 40 mg IV every 8 hours. 4. NSTEMI-troponin was elevated at 27,000, patient started on low-dose heparin with PTT goal 50-60.  No bolus.  No aspirin for now due to increased risk of bleeding.  Patient currently on heparin, IV nitroglycerin for pulmonary edema, statin.  Patient underwent diagnostic catheterization yesterday which showed proximal LAD occluded, LVEF 35% of the entire anterior, apical and apical inferior akinesis.  Continue heparin GTT. neurosurgery recommends stent/dual antiplatelet therapy until Monday or Tuesday which will be postop 9-day or 10 of her intervention. 5. Atrial fibrillation, new onset-patient currently on IV heparin.  Continue amiodarone GTT. CHA2DS2VASc score is 5, continue anticoagulation with IV heparin. 6. Bilateral knee pain-pseudogout.  Patient underwent arthrocentesis of right knee.  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  Orthopedics following.  Patient underwent injection of Depo-Medrol into the right knee.  Continue Voltaren gel. 7. Hypertension-continue Coreg, clonidine patch, as needed hydralazine. 8. Urine retention-Foley catheter in place. 9. Acute kidney injury-creatinine is elevated at 1.66.  Secondary to diuresis with Lasix.  Continue to monitor BMP in a.m. 10. Hypokalemia-potassium is 3.5 today.     COVID-19 Labs  No results for input(s): DDIMER, FERRITIN, LDH, CRP in the last 72 hours.  Lab Results  Component Value Date   [place] NEGATIVE 08/18/2020     Scheduled medications:   . cholecalciferol  3,000 Units Oral Daily  . cloNIDine  0.1 mg Transdermal Weekly  . colchicine  0.3 mg Oral Daily  . diclofenac Sodium  2 g Topical QID  . furosemide  40 mg Intravenous  Q8H  . metoprolol tartrate  25 mg Oral BID  . pantoprazole  40 mg Oral BID  . polyethylene  glycol  17 g Oral BID  . potassium chloride  20 mEq Oral BID  . rosuvastatin  40 mg Oral QHS  . sacubitril-valsartan  1 tablet Oral BID  . senna-docusate  1 tablet Oral BID  . sodium chloride flush  10-40 mL Intracatheter Q12H  . sodium chloride flush  3 mL Intravenous Q12H  . sodium chloride flush  3 mL Intravenous Q12H  . sucralfate  1 g Oral TID WC & HS         CBG: Recent Labs  Lab 08/23/20 0813 08/23/20 1208 08/23/20 1634 08/24/20 0736 08/24/20 1201  GLUCAP 132* 141* 149* 152* 187*    SpO2: 95 % O2 Flow Rate (L/min): 8 L/min FiO2 (%): 80 %    CBC: Recent Labs  Lab 08/18/20 0200 08/18/20 0200 08/19/20 2201 08/19/20 2201 08/20/20 1429 08/20/20 1429 08/21/20 0237 08/21/20 0237 08/22/20 1145 08/23/20 1303 08/23/20 1309 08/23/20 1312 08/24/20 1205  WBC 10.8*   < > 13.7*  --  15.1*  --  14.5*  --  18.7*  --   --   --  11.2*  NEUTROABS 7.0  --   --   --   --   --   --   --   --   --   --   --   --   HGB 11.7*   < > 10.4*   < > 8.9*   < > 9.0*   < > 9.5* 9.5* 9.2* 9.2* 9.4*  HCT 36.9   < > 32.0*   < > 28.2*   < > 28.3*   < > 30.2* 28.0* 27.0* 27.0* 29.3*  MCV 93.2   < > 90.9  --  91.3  --  90.7  --  90.1  --   --   --  89.1  PLT 291   < > 287  --  259  --  303  --  372  --   --   --  305   < > = values in this interval not displayed.    Basic Metabolic Panel: Recent Labs  Lab 08/22/20 0811 08/22/20 0811 08/22/20 1652 08/22/20 1652 08/22/20 1759 08/22/20 1759 08/23/20 0056 08/23/20 1303 08/23/20 1309 08/23/20 1312 08/24/20 1205  NA 132*   < > 131*   < > 130*   < > 130* 132* 134* 135 132*  K 3.9   < > 3.7   < > 3.5   < > 3.6 3.1* 3.0* 2.9* 3.5  CL 97*  --  94*  --  94*  --  95*  --   --   --  94*  CO2 24  --  24  --  21*  --  22  --   --   --  28  GLUCOSE 162*  --  185*  --  178*  --  190*  --   --   --  189*  BUN 30*  --  33*  --  34*  --  35*  --   --   --  37*  CREATININE 1.06*  --  1.17*  --  1.13*  --  1.18*  --   --   --  1.66*  CALCIUM  8.8*  --  9.2  --  9.0  --  8.6*  --   --   --  8.4*  MG  --   --   --   --  2.0  --   --   --   --   --   --    < > = values in this interval not displayed.     Liver Function Tests: Recent Labs  Lab 08/18/20 0200 08/22/20 1759  AST 19 190*  ALT 16 66*  ALKPHOS 79 79  BILITOT 0.4 0.5  PROT 8.0 7.2  ALBUMIN 3.9 2.8*     Antibiotics: Anti-infectives (From admission, onward)   Start     Dose/Rate Route Frequency Ordered Stop   08/20/20 0400  vancomycin (VANCOREADY) IVPB 750 mg/150 mL  Status:  Discontinued        750 mg 150 mL/hr over 60 Minutes Intravenous Every 12 hours 08/19/20 1005 08/21/20 1101   08/19/20 1030  ceFEPIme (MAXIPIME) 2 g in sodium chloride 0.9 % 100 mL IVPB        2 g 200 mL/hr over 30 Minutes Intravenous Every 12 hours 08/19/20 1005     08/19/20 1030  vancomycin (VANCOREADY) IVPB 1750 mg/350 mL        1,750 mg 175 mL/hr over 120 Minutes Intravenous  Once 08/19/20 1005 08/19/20 1907   08/19/20 1030  ceFAZolin (ANCEF) IVPB 2g/100 mL premix        2 g 200 mL/hr over 30 Minutes Intravenous  Once 08/19/20 1019 08/19/20 1120   08/19/20 1021  ceFAZolin (ANCEF) 2-4 GM/100ML-% IVPB       Note to Pharmacy: Henrine Screws   : cabinet override      08/19/20 1021 08/19/20 1124   08/18/20 0515  ceFEPIme (MAXIPIME) 2 g in sodium chloride 0.9 % 100 mL IVPB        2 g 200 mL/hr over 30 Minutes Intravenous  Once 08/18/20 0504 08/18/20 0612   08/18/20 0515  metroNIDAZOLE (FLAGYL) IVPB 500 mg        500 mg 100 mL/hr over 60 Minutes Intravenous  Once 08/18/20 0504 08/18/20 0705   08/18/20 0515  vancomycin (VANCOCIN) IVPB 1000 mg/200 mL premix  Status:  Discontinued        1,000 mg 200 mL/hr over 60 Minutes Intravenous  Once 08/18/20 0504 08/18/20 0507   08/18/20 0515  vancomycin (VANCOREADY) IVPB 1750 mg/350 mL        1,750 mg 175 mL/hr over 120 Minutes Intravenous  Once 08/18/20 0507 08/18/20 1958       DVT prophylaxis: SCDs  Code Status: Full code  Family  Communication: No family at bedside    Status is: Inpatient  Dispo: The patient is from: Home              Anticipated d/c is to: Skilled nursing facility              Anticipated d/c date is: 08/28/2020              Patient currently not medically stable for discharge  Barrier to discharge-ongoing treatment for acute hypoxemic respiratory failure, pulmonary edema     Consultants:  Neurosurgery  Orthopedics   Objective   Vitals:   08/24/20 0500 08/24/20 0737 08/24/20 1028 08/24/20 1139  BP: 100/76 (!) 139/96 104/69 (!) 136/101  Pulse: 83 75 80 74  Resp: 18 16  14   Temp: 98.6 F (37 C) 97.7 F (36.5 C)  97.7 F (36.5 C)  TempSrc: Axillary Oral  Oral  SpO2: 100% 100%  95%  Weight: 91.9 kg  Height:        Intake/Output Summary (Last 24 hours) at 08/24/2020 1600 Last data filed at 08/24/2020 1053 Gross per 24 hour  Intake 949 ml  Output 700 ml  Net 249 ml    09/14 1901 - 09/16 0700 In: -  Out: 1300 [Urine:1300]  Filed Weights   08/18/20 2354 08/20/20 0436 08/24/20 0500  Weight: 89.2 kg 93.3 kg 91.9 kg    Physical Examination:    General-appears in no acute distress  Heart-S1-S2, regular, no murmur auscultated  Lungs-clear to auscultation bilaterally, no wheezing or crackles auscultated  Abdomen-soft, nontender, no organomegaly  Extremities-no edema in the lower extremities  Neuro-alert, oriented x3, no focal deficit noted    Data Reviewed:   Recent Results (from the past 240 hour(s))  Culture, blood (Routine X 2) w Reflex to ID Panel     Status: None   Collection Time: 08/18/20 12:19 AM   Specimen: BLOOD RIGHT FOREARM  Result Value Ref Range Status   Specimen Description   Final    BLOOD RIGHT FOREARM Performed at [facility], [address], [place], [place] [address]    Special Requests   Final    BOTTLES DRAWN AEROBIC AND ANAEROBIC Blood Culture adequate volume Performed at [facility] [address] [place], [place] [address]    Culture   Final    NO GROWTH 5 DAYS Performed at [facility], [place] [address] [place], [place] [address]    Report Status 08/23/2020 FINAL  Final  SARS Coronavirus 2 by RT PCR (hospital order, performed in [facility] hospital lab) Nasopharyngeal Nasopharyngeal Swab     Status: None   Collection Time: 08/18/20 12:19 AM   Specimen: Nasopharyngeal Swab  Result Value Ref Range Status   SARS Coronavirus 2 NEGATIVE NEGATIVE Final    Comment: (NOTE) SARS-CoV-2 target nucleic acids are NOT DETECTED.  The SARS-CoV-2 RNA is generally detectable in upper and lower respiratory specimens during the acute phase of infection. The lowest concentration of SARS-CoV-2 viral copies this assay can detect is 250 copies / mL. A negative result does not preclude SARS-CoV-2 infection and should not be used as the sole basis for treatment or other patient management decisions.  A negative result may occur with improper specimen collection / handling, submission of specimen other than nasopharyngeal swab, presence of viral mutation(s) within the areas targeted by this assay, and inadequate number of viral copies (<250 copies / mL). A negative result must be combined with clinical observations, patient history, and epidemiological information.  Fact Sheet for Patients:   StrictlyIdeas.no  Fact Sheet for Healthcare Providers: BankingDealers.co.za  This test is not yet approved or  cleared by the [place] FDA and has been authorized for detection and/or diagnosis of SARS-CoV-2 by FDA under an Emergency Use Authorization (EUA).  This EUA will remain in effect (meaning this test can be used) for the duration of the COVID-19 declaration under Section 564(b)(1) of the Act, 21 U.S.C. section 360bbb-3(b)(1), unless the authorization is terminated or revoked sooner.  Performed at [facility], [place]  [address], [place], [place] [address]   Culture, blood (Routine X 2) w Reflex to ID Panel     Status: None   Collection Time: 08/18/20  1:56 AM   Specimen: BLOOD LEFT FOREARM  Result Value Ref Range Status   Specimen Description   Final    BLOOD LEFT FOREARM Performed at [facility], [address], [place], [place] [address]    Special Requests   Final    BOTTLES DRAWN AEROBIC AND ANAEROBIC Blood Culture results may not be optimal due to an inadequate volume of blood received in culture bottles Performed at [facility] [address] [place], [place] [address]    Culture   Final    NO GROWTH 5 DAYS Performed at [facility], [place] [address], [place], [place] [address]    Report Status 08/23/2020 FINAL  Final  Urine Culture     Status: Abnormal   Collection Time: 08/18/20  4:07 AM   Specimen: Urine, Catheterized  Result Value Ref Range Status   Specimen Description   Final    URINE, CATHETERIZED Performed at [place] [address] [place], [place] [address]    Special Requests   Final    NONE Performed at [facility], [place] [address] [place], [place] [address]    Culture (A)  Final    <10,000 COLONIES/mL INSIGNIFICANT GROWTH Performed at [facility] [place] [address], [place], [place] [address]    Report Status 08/19/2020 FINAL  Final  Surgical pcr screen     Status: None   Collection Time: 08/19/20  9:45 AM   Specimen: Nasal Mucosa; Nasal Swab  Result Value Ref Range Status   MRSA, PCR NEGATIVE NEGATIVE Final   Staphylococcus aureus NEGATIVE NEGATIVE Final    Comment: (NOTE) The Xpert SA Assay (FDA approved for NASAL specimens in patients 28 years of age and older), is one component of a comprehensive surveillance program. It is not intended to diagnose infection nor to guide or monitor treatment. Performed at [facility], [place] [address] [place], [place] [address]   Body  fluid culture     Status: None   Collection Time: 08/19/20  2:50 PM   Specimen: Body Fluid  Result Value Ref Range Status   Specimen Description FLUID  Final   Special Requests SYNOVIAL RIGHT KNEE  Final   Gram Stain   Final    MODERATE WBC PRESENT, PREDOMINANTLY PMN NO ORGANISMS SEEN    Culture   Final    NO GROWTH 3 DAYS Performed at [facility], [address], [place],  [address]    Report Status 08/23/2020 FINAL  Final    No results for input(s): LIPASE, AMYLASE in the last 168 hours. No results for input(s): AMMONIA in the last 168 hours.  Cardiac Enzymes: Recent Labs  Lab 08/18/20 0200  CKTOTAL 54   BNP (last 3 results) Recent Labs    08/22/20 1759 08/23/20 0056 08/24/20 1205  BNP 833.1* 1,088.7* 704.9*     Studies:  DG Chest 1 View  Result Date: 08/23/2020 CLINICAL DATA:  Pulmonary edema EXAM: CHEST  1 VIEW COMPARISON:  08/22/2020 FINDINGS: Mild bilateral interstitial opacities. Small left pleural effusion. Cardiomediastinal contours are normal. Increased density at the right hilum is likely due to pulmonary vascular congestion given the relatively normal appearance on 08/18/2020. IMPRESSION: Mild pulmonary edema and small left pleural effusion. Electronically Signed   By: Ulyses Jarred M.D.   On: 08/23/2020 01:43   CARDIAC CATHETERIZATION  Result Date: 08/23/2020 Left and right heart catheterization 08/23/2020: RA 13/15, mean 12, RA saturation 56%; RV 51/8, EDP 12 mmHg; PA 54/26, mean 38 mmHg, PA saturation 45%; PW 21/21, mean 20 mmHg, aortic saturation 97%. Cardiac output 4.96, cardiac index 2.51.  SVR elevated at 1258.  PVR mildly elevated at 290.  QP/QS 0.79. Left main: Mild disease. LAD: Proximal 80% stenosis followed by 100% occlusion.  Before the occlusion, at the subtotal site, there is a large diagonal that comes off and has secondary branches.  Apical LAD is barely visualized with minimal amount of collaterals.  Thrombus is evident at the site  of occlusion. Circumflex: Large vessel, moderate-sized OM1 and moderate-sized OM 2.  Minimal disease. RCA: Ostial 80 to 90% stenosis.  Mild disease in the remaining of the vessel, PL branch has a 50% stenosis of the proximal and ostial segment.  Severe dampening was evident with engagement of 5 French diagnostic catheter. LV: EDP mildly elevated at 15- 18 mmHg.  No pressure gradient across the aortic valve.  LVEF 30 to 35% with entire anterior, anterolateral, apical and apical inferior akinesis.  No significant MR. Impression: Patient's presentation is consistent with acute systolic heart failure from recent acute myocardial infarction.  Patient is postop spine surgery and not a candidate for anticoagulation with full dose or with antiplatelet therapy for now in view of risk of development of spine hematoma and paraparesis.  Also the infarct appears to have been completed.  As her EDP is only 15 and wedge although shows mild elevation at 20, she is saturating well and heart rate is much better improved and controlled with regard to atrial fibrillation.  Hence I did not proceed with implantation of intra-aortic balloon pump.  Will be aggressive with fluid management on the floor and watch her carefully.  Have a low threshold for intubation if necessary.  I suspect she probably will recover as it has been greater than 24 hours since her infarct.  DG Chest Port 1 View  Result Date: 08/22/2020 CLINICAL DATA:  Sudden onset shortness of breath. Body aches and fever. EXAM: PORTABLE CHEST 1 VIEW COMPARISON:  08/22/2020 FINDINGS: Low lung volumes are present, causing crowding of the pulmonary vasculature. Indistinct pulmonary vasculature with perihilar and basilar airspace opacities. The peripheral interstitial accentuation and Kerley B lines suggestive of interstitial edema have mildly improved, but the basilar airspace opacities are mildly worsened. Thoracic spondylosis.  Heart size within normal limits. IMPRESSION: 1.  Bilateral interstitial and airspace opacities potentially from edema or atypical pneumonia. Mildly worsened basilar airspace opacities although the interstitial accentuation has improved. Electronically Signed   By: Van Clines M.D.   On: 08/22/2020 18:22       [place]   [facility] If 7PM-7AM, please contact night-coverage at [email or web address], Office  [phone]   08/24/2020, 4:00 PM  LOS: 6 days

## 2020-08-24 NOTE — Progress Notes (Signed)
Occupational Therapy Treatment Patient Details Name: Kristin [name] MRN: [id number] DOB: 12/14/1940 Today's Date: 08/24/2020    History of present illness This is an 79 year old female with a history of hypertension, chronic kidney disease, diabetes, chronic low back pain that presents with worsening low back pain and lower extremity weakness. Now s/p Lumbar Laminectomy for decompression; Also with knee pain, Ortho is addressing, considering injections   OT comments  Pt. Ed on back precautions for bed mobility, adl transfers, and  ADLs. Pt. Was ed on use of AE equipment and following back precautions. Pt. Ed on use of DME for ADLs. Pt. Was cooperative with treatment and will benefit from continued OT.   Follow Up Recommendations  Home health OT    Equipment Recommendations  3 in 1 bedside commode    Recommendations for Other Services      Precautions / Restrictions Precautions Precautions: Back Precaution Comments: ed pt on back precautions and use of ae Restrictions Weight Bearing Restrictions: No       Mobility Bed Mobility Overal bed mobility: Needs Assistance Bed Mobility: Supine to Sit     Supine to sit: Min assist;HOB elevated        Transfers Overall transfer level: Needs assistance Equipment used: Rolling walker (2 wheeled) Transfers: Sit to/from Stand Sit to Stand: Min guard         General transfer comment: cued for proper hand placemetn    Balance     Sitting balance-Leahy Scale: Good       Standing balance-Leahy Scale: Fair                             ADL either performed or assessed with clinical judgement   ADL Overall ADL's : Needs assistance/impaired Eating/Feeding: Independent   Grooming: Supervision/safety;Standing       Lower Body Bathing: Moderate assistance;With adaptive equipment;Cueing for back precautions   Upper Body Dressing : Minimal assistance;Sitting   Lower Body Dressing: Moderate assistance;With  adaptive equipment;Cueing for back precautions   Toilet Transfer: Min guard;Ambulation;BSC   Toileting- Water quality scientist and Hygiene: Sit to/from stand;Moderate assistance (to manage underware)       Functional mobility during ADLs: Min guard General ADL Comments: Pt. ed on back precautioins.      Vision       Perception     Praxis      Cognition Arousal/Alertness: Awake/alert Behavior During Therapy: WFL for tasks assessed/performed   Area of Impairment: Safety/judgement                         Safety/Judgement: Decreased awareness of safety;Decreased awareness of deficits              Exercises     Shoulder Instructions       General Comments      Pertinent Vitals/ Pain       Pain Score: 1  Pain Location: R knee Pain Descriptors / Indicators: Aching Pain Intervention(s): Limited activity within patient's tolerance  Home Living                                          Prior Functioning/Environment              Frequency  Min 2X/week        Progress Toward Goals  OT Goals(current  goals can now be found in the care plan section)  Progress towards OT goals: Progressing toward goals  Acute Rehab OT Goals Patient Stated Goal: to get stronger OT Goal Formulation: With patient Time For Goal Achievement: 09/03/20 Potential to Achieve Goals: Good ADL Goals Pt Will Perform Upper Body Dressing: with modified independence;sitting Pt Will Perform Lower Body Dressing: with modified independence;sit to/from stand;with adaptive equipment Pt Will Transfer to Toilet: with modified independence;ambulating;bedside commode Pt Will Perform Tub/Shower Transfer: Shower transfer;with supervision;rolling walker;ambulating Additional ADL Goal #1: pt will complete bed mobility with 100 back precautions as precursors for adls  Plan Discharge plan remains appropriate    Co-evaluation                 AM-PAC OT "6 Clicks"  Daily Activity     Outcome Measure   Help from another person eating meals?: None Help from another person taking care of personal grooming?: A Little Help from another person toileting, which includes using toliet, bedpan, or urinal?: A Little Help from another person bathing (including washing, rinsing, drying)?: A Little Help from another person to put on and taking off regular upper body clothing?: A Little Help from another person to put on and taking off regular lower body clothing?: A Lot 6 Click Score: 18    End of Session Equipment Utilized During Treatment: Rolling walker;Gait belt;Oxygen  OT Visit Diagnosis: Unsteadiness on feet (R26.81);Muscle weakness (generalized) (M62.81);Pain   Activity Tolerance Patient tolerated treatment well   Patient Left in chair;with call bell/phone within reach;with chair alarm set   Nurse Communication Mobility status;Precautions        Time: 9983-3825 OT Time Calculation (min): 55 min  Charges: OT General Charges $OT Visit: 1 Visit OT Treatments $Self Care/Home Management : 53-67 mins  Reece Packer OT/L   Tayllor Breitenstein 08/24/2020, 10:05 AM

## 2020-08-24 NOTE — Progress Notes (Signed)
Progress Note  Patient Name: Kristin [name] Date of Encounter: 08/24/2020  Attending physician: Oswald Hillock, MD Primary care provider: Caren Macadam, MD Primary Cardiologist: NA Consultant:Tobie Hellen Azalia Bilis, [facility]  Subjective: Kristin [name] is a 79 y.o. female who was seen and examined at bedside at approximately 8:15 AM. No family present at bedside. Weaned off the BiPAP, only use during sleeping hours. Sitting upright having breakfast. Course of events discussed with the patient's nurse this morning. She denies any chest pain. She is able to move all 4 extremities and follow to commands.  Objective: Vital Signs in the last 24 hours: Temp:  [97.7 F (36.5 C)-98.8 F (37.1 C)] 97.7 F (36.5 C) (09/16 1139) Pulse Rate:  [74-89] 74 (09/16 1139) Resp:  [14-19] 14 (09/16 1139) BP: (100-139)/(69-101) 136/101 (09/16 1139) SpO2:  [95 %-100 %] 95 % (09/16 1139) Weight:  [91.9 kg] 91.9 kg (09/16 0500)  Intake/Output:  Intake/Output Summary (Last 24 hours) at 08/24/2020 1756 Last data filed at 08/24/2020 1053 Gross per 24 hour  Intake 949 ml  Output 700 ml  Net 249 ml    Net IO Since Admission: 4,254.46 mL [08/24/20 1756]  Weights:  Filed Weights   08/18/20 2354 08/20/20 0436 08/24/20 0500  Weight: 89.2 kg 93.3 kg 91.9 kg    Telemetry: Normal sinus rhythm.  Physical examination: PHYSICAL EXAM: Vitals with BMI 08/24/2020 08/24/2020 08/24/2020  Height - - -  Weight - - -  BMI - - -  Systolic 269 485 462  Diastolic 703 69 96  Pulse 74 80 75   CONSTITUTIONAL: Well-developed and well-nourished. No acute distress.  Hemodynamically stable. Sitting upright in her hospital bed. SKIN: Skin is warm and dry. No rash noted.  HEAD: Normocephalic and atraumatic.  EYES: No scleral icterus MOUTH/THROAT: Dry oral membranes.  NECK: JVP present. No thyromegaly noted. No carotid bruits  LYMPHATIC: No visible cervical adenopathy.  CHEST Normal respiratory effort. No intercostal  retractions  LUNGS: Clear to auscultation in the upper lung fields with rales noted at the bases.  No expiratory wheezes. CARDIOVASCULAR: Regular rate and rhythm, positive J0-K9 soft holosystolic murmur heard at the apex, no gallops or rubs. ABDOMINAL: Obese, soft, nontender, distended, positive bowel sounds in all 4 quadrants, no apparent ascites.  EXTREMITIES: No peripheral edema, warm to touch bilaterally.  Able to lift both LE against gravity. HEMATOLOGIC: No significant bruising NEUROLOGIC: Oriented to person, place, and time. Nonfocal. Normal muscle tone.  PSYCHIATRIC: Normal mood and affect. Normal behavior. Cooperative  Lab Results: Hematology Recent Labs  Lab 08/21/20 0237 08/21/20 0237 08/22/20 1145 08/23/20 1303 08/23/20 1309 08/23/20 1312 08/24/20 1205  WBC 14.5*  --  18.7*  --   --   --  11.2*  RBC 3.12*  --  3.35*  --   --   --  3.29*  HGB 9.0*   < > 9.5*   < > 9.2* 9.2* 9.4*  HCT 28.3*   < > 30.2*   < > 27.0* 27.0* 29.3*  MCV 90.7  --  90.1  --   --   --  89.1  MCH 28.8  --  28.4  --   --   --  28.6  MCHC 31.8  --  31.5  --   --   --  32.1  RDW 12.9  --  12.9  --   --   --  13.2  PLT 303  --  372  --   --   --  305   < > =  values in this interval not displayed.    Chemistry Recent Labs  Lab 08/18/20 0200 08/19/20 2201 08/22/20 1759 08/22/20 1759 08/23/20 0056 08/23/20 1303 08/23/20 1309 08/23/20 1312 08/24/20 1205  NA 137   < > 130*   < > 130*   < > 134* 135 132*  K 4.0   < > 3.5   < > 3.6   < > 3.0* 2.9* 3.5  CL 101   < > 94*  --  95*  --   --   --  94*  CO2 25   < > 21*  --  22  --   --   --  28  GLUCOSE 138*   < > 178*  --  190*  --   --   --  189*  BUN 26*   < > 34*  --  35*  --   --   --  37*  CREATININE 0.99   < > 1.13*  --  1.18*  --   --   --  1.66*  CALCIUM 9.6   < > 9.0  --  8.6*  --   --   --  8.4*  PROT 8.0  --  7.2  --   --   --   --   --   --   ALBUMIN 3.9  --  2.8*  --   --   --   --   --   --   AST 19  --  190*  --   --   --   --    --   --   ALT 16  --  66*  --   --   --   --   --   --   ALKPHOS 79  --  79  --   --   --   --   --   --   BILITOT 0.4  --  0.5  --   --   --   --   --   --   GFRNONAA 54*   < > 46*  --  44*  --   --   --  29*  GFRAA >60   < > 53*  --  50*  --   --   --  33*  ANIONGAP 11   < > 15  --  13  --   --   --  10   < > = values in this interval not displayed.     Cardiac Enzymes: Cardiac Panel (last 3 results) Recent Labs    08/22/20 1323 08/22/20 1652 08/22/20 1830  TROPONINIHS >27,000* >27,000* >27,000*    BNP (last 3 results) Recent Labs    08/22/20 1759 08/23/20 0056 08/24/20 1205  BNP 833.1* 1,088.7* 704.9*    ProBNP (last 3 results) No results for input(s): PROBNP in the last 8760 hours.   DDimer No results for input(s): DDIMER in the last 168 hours.   Hemoglobin A1c: No results found for: HGBA1C, MPG  TSH  Recent Labs    03/13/20 0949 08/22/20 1759  TSH 1.190 1.054    Lipid Panel No results found for: CHOL, TRIG, HDL, CHOLHDL, VLDL, LDLCALC, LDLDIRECT  Imaging: DG Chest 1 View  Result Date: 08/23/2020 CLINICAL DATA:  Pulmonary edema EXAM: CHEST  1 VIEW COMPARISON:  08/22/2020 FINDINGS: Mild bilateral interstitial opacities. Small left pleural effusion. Cardiomediastinal contours are normal. Increased density at the right hilum is likely due to  pulmonary vascular congestion given the relatively normal appearance on 08/18/2020. IMPRESSION: Mild pulmonary edema and small left pleural effusion. Electronically Signed   By: Ulyses Jarred M.D.   On: 08/23/2020 01:43   CARDIAC CATHETERIZATION  Result Date: 08/23/2020 Left and right heart catheterization 08/23/2020: RA 13/15, mean 12, RA saturation 56%; RV 51/8, EDP 12 mmHg; PA 54/26, mean 38 mmHg, PA saturation 45%; PW 21/21, mean 20 mmHg, aortic saturation 97%. Cardiac output 4.96, cardiac index 2.51.  SVR elevated at 1258.  PVR mildly elevated at 290.  QP/QS 0.79. Left main: Mild disease. LAD: Proximal 80% stenosis  followed by 100% occlusion.  Before the occlusion, at the subtotal site, there is a large diagonal that comes off and has secondary branches.  Apical LAD is barely visualized with minimal amount of collaterals.  Thrombus is evident at the site of occlusion. Circumflex: Large vessel, moderate-sized OM1 and moderate-sized OM 2.  Minimal disease. RCA: Ostial 80 to 90% stenosis.  Mild disease in the remaining of the vessel, PL branch has a 50% stenosis of the proximal and ostial segment.  Severe dampening was evident with engagement of 5 French diagnostic catheter. LV: EDP mildly elevated at 15- 18 mmHg.  No pressure gradient across the aortic valve.  LVEF 30 to 35% with entire anterior, anterolateral, apical and apical inferior akinesis.  No significant MR. Impression: Patient's presentation is consistent with acute systolic heart failure from recent acute myocardial infarction.  Patient is postop spine surgery and not a candidate for anticoagulation with full dose or with antiplatelet therapy for now in view of risk of development of spine hematoma and paraparesis.  Also the infarct appears to have been completed.  As her EDP is only 15 and wedge although shows mild elevation at 20, she is saturating well and heart rate is much better improved and controlled with regard to atrial fibrillation.  Hence I did not proceed with implantation of intra-aortic balloon pump.  Will be aggressive with fluid management on the floor and watch her carefully.  Have a low threshold for intubation if necessary.  I suspect she probably will recover as it has been greater than 24 hours since her infarct.  DG Chest Port 1 View  Result Date: 08/22/2020 CLINICAL DATA:  Sudden onset shortness of breath. Body aches and fever. EXAM: PORTABLE CHEST 1 VIEW COMPARISON:  08/22/2020 FINDINGS: Low lung volumes are present, causing crowding of the pulmonary vasculature. Indistinct pulmonary vasculature with perihilar and basilar airspace  opacities. The peripheral interstitial accentuation and Kerley B lines suggestive of interstitial edema have mildly improved, but the basilar airspace opacities are mildly worsened. Thoracic spondylosis.  Heart size within normal limits. IMPRESSION: 1. Bilateral interstitial and airspace opacities potentially from edema or atypical pneumonia. Mildly worsened basilar airspace opacities although the interstitial accentuation has improved. Electronically Signed   By: Van Clines M.D.   On: 08/22/2020 18:22    Cardiac database: EKG: 08/18/2020: Normal sinus rhythm, 94 bpm, normal axis, without underlying injury pattern.  08/22/2020 1139: Atrial fibrillation, 95 bpm, PRWP, T wave inversions in lead I and aVL suggestive of high lateral ischemia, without underlying injury pattern.  08/22/2020 1332: Atrial fibrillation, 93 bpm,  poor R wave progression, and possible anteroseptal injury pattern.  Poor R wave progression and ST changes are new compared to EKG 08/18/2020.  Echocardiogram: ECHO COMPLETE WITH IMAGING ENHANCING AGENT 08/22/2020 1. Left ventricular ejection fraction, by estimation, is 30 to 35%. The left ventricle has moderately decreased function. The left ventricle  demonstrates regional wall motion abnormalities. Hyokinesis of base to mid anterior wall, base to mid anterospetal wall, apical septal, and apex. There is moderate left ventricular hypertrophy. Diastolic function is not assessed due to atrial fibrillation. No apparent left ventricular thrombus. 2. Right ventricular systolic function is normal. The right ventricular size is normal. There is moderately elevated pulmonary artery systolic pressure. The estimated right ventricular systolic pressure is 56.2 mmHg. 3. The mitral valve is normal in structure. Mild mitral valve regurgitation. No evidence of mitral stenosis. 4. The aortic valve is normal in structure. Aortic valve regurgitation is not visualized. Mild aortic valve sclerosis  is present, with no evidence of aortic valve stenosis. 5. The inferior vena cava is dilated in size with >50% respiratory variability, suggesting right atrial pressure of 8 mmHg.  Scheduled Meds: . cholecalciferol  3,000 Units Oral Daily  . cloNIDine  0.1 mg Transdermal Weekly  . colchicine  0.3 mg Oral Daily  . diclofenac Sodium  2 g Topical QID  . furosemide  40 mg Intravenous Q8H  . metoprolol tartrate  25 mg Oral BID  . pantoprazole  40 mg Oral BID  . polyethylene glycol  17 g Oral BID  . potassium chloride  20 mEq Oral BID  . rosuvastatin  40 mg Oral QHS  . sacubitril-valsartan  1 tablet Oral BID  . senna-docusate  1 tablet Oral BID  . sodium chloride flush  10-40 mL Intracatheter Q12H  . sodium chloride flush  3 mL Intravenous Q12H  . sodium chloride flush  3 mL Intravenous Q12H  . sucralfate  1 g Oral TID WC & HS    Continuous Infusions: . sodium chloride 200 mL (08/24/20 1544)  . amiodarone 30 mg/hr (08/24/20 1026)  . heparin 1,000 Units/hr (08/24/20 1501)  . methocarbamol (ROBAXIN) IV    . nitroGLYCERIN 15 mcg/min (08/23/20 0153)    PRN Meds: sodium chloride, acetaminophen **OR** acetaminophen, alum & mag hydroxide-simeth, bisacodyl, hydrALAZINE, HYDROcodone-acetaminophen, HYDROmorphone (DILAUDID) injection, menthol-cetylpyridinium **OR** phenol, methocarbamol **OR** methocarbamol (ROBAXIN) IV, morphine injection, nitroGLYCERIN, ondansetron **OR** ondansetron (ZOFRAN) IV, oxyCODONE-acetaminophen, sodium chloride flush, sodium chloride flush   IMPRESSION & RECOMMENDATIONS: Terrea Bruster is a 79 y.o. female whose past medical history and cardiac risk factors include: NSTEMI, Acute pulmonary edema, Acute HFrEF, cardiomyopathy, Atrial fibrillation (new onset),  Lumbar stenosis status post laminectomy, hypertension, CKD, diabetes, postmenopausal female, advanced age.   Impression:  NSTEMI  Currently on heparin gtt with goal aPTT between 50-60 per neurosurgery  recommendations. Pharmacy to dose.    Currently on BiPAP as needed.  Continue statin  Continue home dose beta blockers.   Continue IV nitro gtt for pulmonary edema   Transition from IV Lasix to IV Bumex.  Hold Entresto, due to acute kidney injury  Start BiDil  Despite diuresis patient is currently in a positive fluid balance.  This is probably secondary to inaccurate I's and O's and IV drips.  Acute HFrEF: see above.   Acute Pulmonary Edema: continue BiPAP and diuresis, strict I and Os, daily weights.   Ischemic cardiomyopathy: See above  Established coronary artery disease without angina pectoris:  Not on antiplatelet therapy per neurosurgery recommendations.  Continue guideline directed medical therapy as discussed above.  Acute kidney injury: Multifactorial due to diuresis and recent contrast use  Avoid nephrotoxic agents  Hold Entresto transition to BiDil  Continue to monitor BUN and creatinine.  Paroxysmal atrial fibrillation:  Currently in normal sinus rhythm.  Transition IV amiodarone drip to oral 200 mg p.o. daily  On IV heparin with a goal PTT between 50-60 per neurosurgery recommendations.Pharmacy to dose.    CHA2DS2-VASc SCORE is 5 which correlates to 6.7 % risk of stroke per year (hypertension, age greater than 63, diabetes, female sex).  Continue telemetry.   Hyperlipidemia: Continue statin therapy.  Hypertension: Currently managed by primary team.  Plan of care d/w her RN as well.   Patient's questions and concerns were addressed to her satisfaction. She voices understanding of the instructions provided during this encounter.   This note was created using a voice recognition software as a result there may be grammatical errors inadvertently enclosed that do not reflect the nature of this encounter. Every attempt is made to correct such errors.  Rex Kras, DO, [facility]. [place] Office: [phone] 08/24/2020, 5:56 PM

## 2020-08-24 NOTE — Progress Notes (Signed)
ANTICOAGULATION CONSULT NOTE   Pharmacy Consult for Heparin Indication: ACS/afib  No Known Allergies  Patient Measurements: Height: 5\' 4"  (162.6 cm) Weight: 91.9 kg (202 lb 9.6 oz) IBW/kg (Calculated) : 54.7 Heparin Dosing Weight: 75 kg  Vital Signs: Temp: 97.6 F (36.4 C) (09/16 2048) Temp Source: Oral (09/16 2048) BP: 130/71 (09/16 2211) Pulse Rate: 84 (09/16 2211)  Labs: Recent Labs    08/22/20 0811 08/22/20 1145 08/22/20 1145 08/22/20 1323 08/22/20 1652 08/22/20 1652 08/22/20 1759 08/22/20 1830 08/23/20 0056 08/23/20 1000 08/23/20 1309 08/23/20 1309 08/23/20 1312 08/24/20 0155 08/24/20 1205 08/24/20 1346 08/24/20 2220  HGB  --  9.5*  --   --   --   --   --   --   --    < > 9.2*   < > 9.2*  --  9.4*  --   --   HCT  --  30.2*  --   --   --   --   --   --   --    < > 27.0*  --  27.0*  --  29.3*  --   --   PLT  --  372  --   --   --   --   --   --   --   --   --   --   --   --  305  --   --   APTT  --   --   --   --   --   --   --   --  89*   < >  --   --   --    < > 171* 70* 71*  CREATININE   < >  --   --   --  1.17*   < > 1.13*  --  1.18*  --   --   --   --   --  1.66*  --   --   TROPONINIHS  --  >27,000*   < > >27,000* >27,000*  --   --  >27,000*  --   --   --   --   --   --   --   --   --    < > = values in this interval not displayed.    Estimated Creatinine Clearance: 29.7 mL/min (A) (by C-G formula based on SCr of 1.66 mg/dL (H)).  Assessment: 79 yo female with new onset ACS per telephone conversation with Dr. Terri Skains of Cardiology. Patient has had a recent Neurosurgical procedure, therefore Neurosurgery is requesting no boluses and a tight APTT goal of 50-60.   PTT 71 sec (remains slightly supratherapeutic) on gtt at 1000 units/hr - level drawn peripherally. No issues with line or bleeding reported per RN.  Goal of Therapy:  APTT 50-60 sec per neurosurgery Monitor platelets by anticoagulation protocol: Yes   Plan:  Decrease heparin drip to 900  units/hr  aPTT in 8 hours - peripheral draw  Sherlon Handing, PharmD, BCPS Please see amion for complete clinical pharmacist phone list 08/24/2020 11:10 PM

## 2020-08-24 NOTE — Care Management Important Message (Signed)
Important Message  Patient Details  Name: Kristin [name] MRN: [id number] Date of Birth: [birth year]   Medicare Important Message Given:  Yes     Shelda Altes 08/24/2020, 9:16 AM

## 2020-08-24 NOTE — Progress Notes (Signed)
ANTICOAGULATION CONSULT NOTE   Pharmacy Consult for Heparin Indication: ACS  No Known Allergies  Patient Measurements: Height: 5\' 4"  (162.6 cm) Weight: 93.3 kg (205 lb 11 oz) IBW/kg (Calculated) : 54.7 Heparin Dosing Weight: 75 kg  Vital Signs: Temp: 98.8 F (37.1 C) (09/16 0005) Temp Source: Axillary (09/16 0005) BP: 110/71 (09/16 0005) Pulse Rate: 81 (09/16 0324)  Labs: Recent Labs    08/22/20 0811 08/22/20 1145 08/22/20 1145 08/22/20 1323 08/22/20 1652 08/22/20 1759 08/22/20 1830 08/23/20 0056 08/23/20 1000 08/23/20 1303 08/23/20 1303 08/23/20 1309 08/23/20 1312 08/24/20 0155  HGB  --  9.5*   < >  --   --   --   --   --   --  9.5*   < > 9.2* 9.2*  --   HCT  --  30.2*   < >  --   --   --   --   --   --  28.0*  --  27.0* 27.0*  --   PLT  --  372  --   --   --   --   --   --   --   --   --   --   --   --   APTT  --   --   --   --   --   --   --  89* 40*  --   --   --   --  34  CREATININE   < >  --   --   --  1.17* 1.13*  --  1.18*  --   --   --   --   --   --   TROPONINIHS  --  >27,000*   < > >27,000* >27,000*  --  >27,000*  --   --   --   --   --   --   --    < > = values in this interval not displayed.    Estimated Creatinine Clearance: 42.1 mL/min (A) (by C-G formula based on SCr of 1.18 mg/dL (H)).  Assessment: 79 yo female with new onset ACS per telephone conversation with Dr. Terri Skains of Cardiology. Patient has had a recent Neurosurgical procedure, therefore Neurosurgery is requesting no boluses and a tight APTT goal of 50-60.   Heparin restarted s/p cath 9/15. PTT continues to be low (down to 34 sec) on gtt at 950 units/hr. No issues with line or bleeding reported per RN.  Goal of Therapy:  APTT 50-60 sec per neurosurgery Monitor platelets by anticoagulation protocol: Yes   Plan:  Increase heparin drip to 1100 units/hr  PTT in 8 hours  Sherlon Handing, PharmD, BCPS Please see amion for complete clinical pharmacist phone list 08/24/2020 3:28 AM

## 2020-08-25 DIAGNOSIS — I130 Hypertensive heart and chronic kidney disease with heart failure and stage 1 through stage 4 chronic kidney disease, or unspecified chronic kidney disease: Secondary | ICD-10-CM

## 2020-08-25 LAB — GLUCOSE, CAPILLARY
Glucose-Capillary: 143 mg/dL — ABNORMAL HIGH (ref 70–99)
Glucose-Capillary: 131 mg/dL — ABNORMAL HIGH (ref 70–99)
Glucose-Capillary: 190 mg/dL — ABNORMAL HIGH (ref 70–99)

## 2020-08-25 LAB — BASIC METABOLIC PANEL
Anion gap: 8 (ref 5–15)
BUN: 36 mg/dL — ABNORMAL HIGH (ref 8–23)
CO2: 29 mmol/L (ref 22–32)
Calcium: 8.5 mg/dL — ABNORMAL LOW (ref 8.9–10.3)
Chloride: 97 mmol/L — ABNORMAL LOW (ref 98–111)
Creatinine, Ser: 1.83 mg/dL — ABNORMAL HIGH (ref 0.44–1.00)
GFR calc Af Amer: 30 mL/min — ABNORMAL LOW (ref >60)
GFR calc non Af Amer: 26 mL/min — ABNORMAL LOW (ref >60)
Glucose, Bld: 144 mg/dL — ABNORMAL HIGH (ref 70–99)
Potassium: 4.1 mmol/L (ref 3.5–5.1)
Sodium: 134 mmol/L — ABNORMAL LOW (ref 135–145)

## 2020-08-25 LAB — CBC
HCT: 28.7 % — ABNORMAL LOW (ref 36.0–46.0)
Hemoglobin: 9.2 g/dL — ABNORMAL LOW (ref 12.0–15.0)
MCH: 29.1 pg (ref 26.0–34.0)
MCHC: 32.1 g/dL (ref 30.0–36.0)
MCV: 90.8 fL (ref 80.0–100.0)
Platelets: 327 10*3/uL (ref 150–400)
RBC: 3.16 MIL/uL — ABNORMAL LOW (ref 3.87–5.11)
RDW: 13.2 % (ref 11.5–15.5)
WBC: 11.6 10*3/uL — ABNORMAL HIGH (ref 4.0–10.5)
nRBC: 0 % (ref 0.0–0.2)

## 2020-08-25 LAB — APTT
aPTT: 43 seconds — ABNORMAL HIGH (ref 24–36)
aPTT: 60 seconds — ABNORMAL HIGH (ref 24–36)

## 2020-08-25 LAB — BRAIN NATRIURETIC PEPTIDE: B Natriuretic Peptide: 517.4 pg/mL — ABNORMAL HIGH (ref 0.0–100.0)

## 2020-08-25 NOTE — Progress Notes (Signed)
ANTICOAGULATION CONSULT NOTE   Pharmacy Consult for Heparin Indication: ACS/afib  No Known Allergies  Patient Measurements: Height: 5\' 4"  (162.6 cm) Weight: 89.1 kg (196 lb 6.4 oz) IBW/kg (Calculated) : 54.7 Heparin Dosing Weight: 75 kg  Vital Signs: Temp: 98.2 F (36.8 C) (09/17 2051) Temp Source: Oral (09/17 2051) BP: 121/74 (09/17 2051) Pulse Rate: 91 (09/17 2051)  Labs: Recent Labs    08/23/20 0056 08/23/20 1000 08/23/20 1312 08/24/20 0155 08/24/20 1205 08/24/20 1346 08/24/20 2220 08/25/20 0637 08/25/20 1813  HGB  --    < > 9.2*  --  9.4*  --   --  9.2*  --   HCT  --    < > 27.0*  --  29.3*  --   --  28.7*  --   PLT  --   --   --   --  305  --   --  327  --   APTT 89*   < >  --    < > 171*   < > 71* 43* 60*  CREATININE 1.18*  --   --   --  1.66*  --   --  1.83*  --    < > = values in this interval not displayed.    Estimated Creatinine Clearance: 26.5 mL/min (A) (by C-G formula based on SCr of 1.83 mg/dL (H)).  Assessment: 79 yo female with new onset ACS per telephone conversation with Dr. Terri Skains of Cardiology. Patient has had a recent Neurosurgical procedure, therefore Neurosurgery is requesting no boluses and a tight APTT goal of 50-60.  F/u plan back to cath lab for intervention next week  PTT 60sec at goal on heparin drip 950 units/hr. Aptt's have been very labile, will make small adjustments as needed  No issues with line or bleeding reported per RN.  Goal of Therapy:  APTT 50-60 sec per neurosurgery Monitor platelets by anticoagulation protocol: Yes   Plan:  Continue  heparin drip 950 units/hr  aPTT and cbc daily   Bonnita Nasuti Pharm.D. CPP, BCPS Clinical Pharmacist [phone] 08/25/2020 9:12 PM

## 2020-08-25 NOTE — Progress Notes (Signed)
[facility]  PROGRESS NOTE  Kristin [name] YPP:[id number] DOB: 1941-07-21 DOA: 08/18/2020 PCP: Caren Macadam, MD   Brief HPI:   79 year old female with history of chronic back pain, hypertension, diabetes mellitus type 2 came to ED with complaints of bilateral lower extremity weakness and worsening lower back pain.  Patient had an MRI on 04/13/2020 which showed advanced lumbar facet arthrosis with multilevel degenerative anterolisthesis, severe spinal stenosis at L2-3 and L3-4.  Mild to moderate spinal stenosis at L4-5.  MRI ordered during this admission showed similar changes.  Neurosurgery was consulted and surgical intervention was recommended for possible phlegmon at L3-4.  No evidence of infection was found during lumbar laminectomy.  Patient has been receiving IV antibiotics.  Patient developed acute hypoxemic respiratory failure on 08/22/2020, likely from pulmonary edema.  Cardiology was consulted.    Subjective   Patient seen and examined, denies any complaints.  No chest pain or shortness of breath.   Assessment/Plan:     1. SIRS-patient presents with fever, tachycardia, tachypnea.  No clear source of infection.  No evidence of infection of lumbar spine or knee joint infection.  Patient was started on vancomycin and cefepime, vancomycin has been discontinued as of 08/21/2020.  Continue with IV cefepime at this time.  No evidence found during laminectomy and decompression.  Blood culture showed no growth.  Urine culture showed insignificant growth.  Synovial fluid right knee showed no growth.  CT abdomen was negative for infection.   2. L2 -L4 severe lumbar stenosis-patient presented with lower extremity pain and numbness.  Underwent bilateral L2, L3, partial L4 laminectomy for decompression of neural elements on 08/19/2020.  No evidence of infection was found.  PT evaluation recommended home health PT. patient is currently on heparin.  Monitor for hematoma/lower extremity  weakness. 3. Acute hypoxemic respiratory failure-secondary pulmonary edema.  BNP was elevated.  Cardiology was consulted.  Patient was started on IV Lasix.  Lasix has been changed to Bumex. 4. NSTEMI-troponin was elevated at 27,000, patient started on low-dose heparin with PTT goal 50-60.  No bolus.  No aspirin for now due to increased risk of bleeding.  Patient currently on heparin, IV nitroglycerin for pulmonary edema, statin.  IV nitroglycerin has been discontinued.  Patient underwent diagnostic catheterization  which showed proximal LAD occluded, LVEF 35% of the entire anterior, apical and apical inferior akinesis.  Continue heparin GTT. neurosurgery recommends stent/dual antiplatelet therapy until Monday or Tuesday which will be postop 9-day or 10 of her intervention. 5. Atrial fibrillation, new onset-patient currently on IV heparin.  Continue amiodarone GTT. CHA2DS2VASc score is 5, continue anticoagulation with IV heparin. 6. Bilateral knee pain-pseudogout.  Patient underwent arthrocentesis of right knee.  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  Orthopedics following.  Patient underwent injection of Depo-Medrol into the right knee.  Continue Voltaren gel. 7. Hypertension-continue Coreg, clonidine patch, as needed hydralazine. 8. Urine retention-Foley catheter in place. 9. Acute kidney injury-creatinine is elevated at 1.83.  Secondary to diuresis with Lasix. Continue to monitor BMP in a.m. 10. Hypokalemia-potassium is 3.5 today.     COVID-19 Labs  No results for input(s): DDIMER, FERRITIN, LDH, CRP in the last 72 hours.  Lab Results  Component Value Date   [place] NEGATIVE 08/18/2020     Scheduled medications:   . amiodarone  200 mg Oral Daily  . bumetanide (BUMEX) IV  1 mg Intravenous Q12H  . cholecalciferol  3,000 Units Oral Daily  . cloNIDine  0.1 mg Transdermal Weekly  . colchicine  0.3 mg Oral Daily  . diclofenac Sodium  2 g Topical QID  .  isosorbide-hydrALAZINE  1 tablet Oral TID  . metoprolol tartrate  25 mg Oral BID  . pantoprazole  40 mg Oral BID  . polyethylene glycol  17 g Oral BID  . potassium chloride  20 mEq Oral BID  . rosuvastatin  40 mg Oral QHS  . senna-docusate  1 tablet Oral BID  . sodium chloride flush  10-40 mL Intracatheter Q12H  . sodium chloride flush  3 mL Intravenous Q12H  . sodium chloride flush  3 mL Intravenous Q12H  . sucralfate  1 g Oral TID WC & HS         CBG: Recent Labs  Lab 08/24/20 0736 08/24/20 1201 08/24/20 1721 08/24/20 2053 08/25/20 0757  GLUCAP 152* 187* 123* 226* 143*    SpO2: 100 % O2 Flow Rate (L/min): 8 L/min FiO2 (%): 80 %    CBC: Recent Labs  Lab 08/20/20 1429 08/20/20 1429 08/21/20 0237 08/21/20 0237 08/22/20 1145 08/22/20 1145 08/23/20 1303 08/23/20 1309 08/23/20 1312 08/24/20 1205 08/25/20 0637  WBC 15.1*  --  14.5*  --  18.7*  --   --   --   --  11.2* 11.6*  HGB 8.9*   < > 9.0*   < > 9.5*   < > 9.5* 9.2* 9.2* 9.4* 9.2*  HCT 28.2*   < > 28.3*   < > 30.2*   < > 28.0* 27.0* 27.0* 29.3* 28.7*  MCV 91.3  --  90.7  --  90.1  --   --   --   --  89.1 90.8  PLT 259  --  303  --  372  --   --   --   --  305 327   < > = values in this interval not displayed.    Basic Metabolic Panel: Recent Labs  Lab 08/22/20 1652 08/22/20 1652 08/22/20 1759 08/22/20 1759 08/23/20 0056 08/23/20 0056 08/23/20 1303 08/23/20 1309 08/23/20 1312 08/24/20 1205 08/25/20 0637  NA 131*   < > 130*   < > 130*   < > 132* 134* 135 132* 134*  K 3.7   < > 3.5   < > 3.6   < > 3.1* 3.0* 2.9* 3.5 4.1  CL 94*  --  94*  --  95*  --   --   --   --  94* 97*  CO2 24  --  21*  --  22  --   --   --   --  28 29  GLUCOSE 185*  --  178*  --  190*  --   --   --   --  189* 144*  BUN 33*  --  34*  --  35*  --   --   --   --  37* 36*  CREATININE 1.17*  --  1.13*  --  1.18*  --   --   --   --  1.66* 1.83*  CALCIUM 9.2  --  9.0  --  8.6*  --   --   --   --  8.4* 8.5*  MG  --   --  2.0  --    --   --   --   --   --   --   --    < > = values in this interval not displayed.     Liver Function Tests: Recent Labs  Lab 08/22/20 1759  AST 190*  ALT 66*  ALKPHOS 79  BILITOT 0.5  PROT 7.2  ALBUMIN 2.8*     Antibiotics: Anti-infectives (From admission, onward)   Start     Dose/Rate Route Frequency Ordered Stop   08/20/20 0400  vancomycin (VANCOREADY) IVPB 750 mg/150 mL  Status:  Discontinued        750 mg 150 mL/hr over 60 Minutes Intravenous Every 12 hours 08/19/20 1005 08/21/20 1101   08/19/20 1030  ceFEPIme (MAXIPIME) 2 g in sodium chloride 0.9 % 100 mL IVPB        2 g 200 mL/hr over 30 Minutes Intravenous Every 12 hours 08/19/20 1005 08/24/20 1900   08/19/20 1030  vancomycin (VANCOREADY) IVPB 1750 mg/350 mL        1,750 mg 175 mL/hr over 120 Minutes Intravenous  Once 08/19/20 1005 08/19/20 1907   08/19/20 1030  ceFAZolin (ANCEF) IVPB 2g/100 mL premix        2 g 200 mL/hr over 30 Minutes Intravenous  Once 08/19/20 1019 08/19/20 1120   08/19/20 1021  ceFAZolin (ANCEF) 2-4 GM/100ML-% IVPB       Note to Pharmacy: Henrine Screws   : cabinet override      08/19/20 1021 08/19/20 1124   08/18/20 0515  ceFEPIme (MAXIPIME) 2 g in sodium chloride 0.9 % 100 mL IVPB        2 g 200 mL/hr over 30 Minutes Intravenous  Once 08/18/20 0504 08/18/20 0612   08/18/20 0515  metroNIDAZOLE (FLAGYL) IVPB 500 mg        500 mg 100 mL/hr over 60 Minutes Intravenous  Once 08/18/20 0504 08/18/20 0705   08/18/20 0515  vancomycin (VANCOCIN) IVPB 1000 mg/200 mL premix  Status:  Discontinued        1,000 mg 200 mL/hr over 60 Minutes Intravenous  Once 08/18/20 0504 08/18/20 0507   08/18/20 0515  vancomycin (VANCOREADY) IVPB 1750 mg/350 mL        1,750 mg 175 mL/hr over 120 Minutes Intravenous  Once 08/18/20 0507 08/18/20 1958       DVT prophylaxis: SCDs  Code Status: Full code  Family Communication: No family at bedside    Status is: Inpatient  Dispo: The patient is from: Home               Anticipated d/c is to: Skilled nursing facility              Anticipated d/c date is: 08/28/2020              Patient currently not medically stable for discharge  Barrier to discharge-ongoing treatment for acute hypoxemic respiratory failure, pulmonary edema     Consultants:  Neurosurgery  Orthopedics   Objective   Vitals:   08/25/20 0000 08/25/20 0322 08/25/20 0934 08/25/20 1052  BP: 128/78 110/90 (!) 99/55 108/62  Pulse: 84 90  91  Resp: 16 17  16   Temp: 97.6 F (36.4 C) 99.4 F (37.4 C)  98.2 F (36.8 C)  TempSrc: Oral Oral  Oral  SpO2: 100% 100%  100%  Weight:  89.1 kg    Height:        Intake/Output Summary (Last 24 hours) at 08/25/2020 1417 Last data filed at 08/25/2020 0700 Gross per 24 hour  Intake 931.46 ml  Output --  Net 931.46 ml    09/15 1901 - 09/17 0700 In: 1880.5 [P.O.:1149; I.V.:731.5] Out: 700 [Urine:700]  Filed Weights   08/20/20 0436 08/24/20 0500  08/25/20 0322  Weight: 93.3 kg 91.9 kg 89.1 kg    Physical Examination:   General-appears in no acute distress Heart-S1-S2, regular, no murmur auscultated Lungs-clear to auscultation bilaterally, no wheezing or crackles auscultated Abdomen-soft, nontender, no organomegaly Extremities-no edema in the lower extremities Neuro-alert, oriented x3, no focal deficit noted   Data Reviewed:   Recent Results (from the past 240 hour(s))  Culture, blood (Routine X 2) w Reflex to ID Panel     Status: None   Collection Time: 08/18/20 12:19 AM   Specimen: BLOOD RIGHT FOREARM  Result Value Ref Range Status   Specimen Description   Final    BLOOD RIGHT FOREARM Performed at [facility], [address], [place], [place] [address]    Special Requests   Final    BOTTLES DRAWN AEROBIC AND ANAEROBIC Blood Culture adequate volume Performed at [place] [address], [place], [place] [address]    Culture   Final    NO GROWTH 5 DAYS Performed at [place], [place] [address], [place], [place] [address]    Report Status 08/23/2020 FINAL  Final  SARS Coronavirus 2 by RT PCR (hospital order, performed in [facility] hospital lab) Nasopharyngeal Nasopharyngeal Swab     Status: None   Collection Time: 08/18/20 12:19 AM   Specimen: Nasopharyngeal Swab  Result Value Ref Range Status   SARS Coronavirus 2 NEGATIVE NEGATIVE Final    Comment: (NOTE) SARS-CoV-2 target nucleic acids are NOT DETECTED.  The SARS-CoV-2 RNA is generally detectable in upper and lower respiratory specimens during the acute phase of infection. The lowest concentration of SARS-CoV-2 viral copies this assay can detect is 250 copies / mL. A negative result does not preclude SARS-CoV-2 infection and should not be used as the sole basis for treatment or other patient management decisions.  A negative result may occur with improper specimen collection / handling, submission of specimen other than nasopharyngeal swab, presence of viral mutation(s) within the areas targeted by this assay, and inadequate number of viral copies (<250 copies / mL). A negative result must be combined with clinical observations, patient history, and epidemiological information.  Fact Sheet for Patients:   StrictlyIdeas.no  Fact Sheet for Healthcare Providers: BankingDealers.co.za  This test is not yet approved or  cleared by the [place] FDA and has been authorized for detection and/or diagnosis of SARS-CoV-2 by FDA under an Emergency Use Authorization (EUA).  This EUA will remain in effect (meaning this test can be used) for the duration of the COVID-19 declaration under Section 564(b)(1) of the Act, 21 U.S.C. section 360bbb-3(b)(1), unless the authorization is terminated or revoked sooner.  Performed at [facility], [place] [address] [place], [place] [address]   Culture, blood (Routine X 2) w Reflex to ID Panel     Status: None    Collection Time: 08/18/20  1:56 AM   Specimen: BLOOD LEFT FOREARM  Result Value Ref Range Status   Specimen Description   Final    BLOOD LEFT FOREARM Performed at [facility], [place] [address], [place], [place] [address]    Special Requests   Final    BOTTLES DRAWN AEROBIC AND ANAEROBIC Blood Culture results may not be optimal due to an inadequate volume of blood received in culture bottles Performed at [place] [address] [place] [address], [place], [place] [address]    Culture   Final    NO GROWTH 5 DAYS Performed  at [facility], [place] [address] [place], [place] [address]    Report Status 08/23/2020 FINAL  Final  Urine Culture     Status: Abnormal   Collection Time: 08/18/20  4:07 AM   Specimen: Urine, Catheterized  Result Value Ref Range Status   Specimen Description   Final    URINE, CATHETERIZED Performed at [facility] [address] [place], [place] [address]    Special Requests   Final    NONE Performed at [facility], [place] [address] [place], [place] [address]    Culture (A)  Final    <10,000 COLONIES/mL INSIGNIFICANT GROWTH Performed at [facility] [address], [place], [place] [address]    Report Status 08/19/2020 FINAL  Final  Surgical pcr screen     Status: None   Collection Time: 08/19/20  9:45 AM   Specimen: Nasal Mucosa; Nasal Swab  Result Value Ref Range Status   MRSA, PCR NEGATIVE NEGATIVE Final   Staphylococcus aureus NEGATIVE NEGATIVE Final    Comment: (NOTE) The Xpert SA Assay (FDA approved for NASAL specimens in patients 72 years of age and older), is one component of a comprehensive surveillance program. It is not intended to diagnose infection nor to guide or monitor treatment. Performed at [facility], [place] [address], [place], [place] [address]   Body fluid culture     Status: None   Collection Time: 08/19/20  2:50 PM   Specimen: Body Fluid  Result Value Ref  Range Status   Specimen Description FLUID  Final   Special Requests SYNOVIAL RIGHT KNEE  Final   Gram Stain   Final    MODERATE WBC PRESENT, PREDOMINANTLY PMN NO ORGANISMS SEEN    Culture   Final    NO GROWTH 3 DAYS Performed at [facility], [address] [place], [place] [address]    Report Status 08/23/2020 FINAL  Final    No results for input(s): LIPASE, AMYLASE in the last 168 hours. No results for input(s): AMMONIA in the last 168 hours.  Cardiac Enzymes: No results for input(s): CKTOTAL, CKMB, CKMBINDEX, TROPONINI in the last 168 hours. BNP (last 3 results) Recent Labs    08/23/20 0056 08/24/20 1205 08/25/20 0637  BNP 1,088.7* 704.9* 517.4*     Oswald Hillock   [facility] If 7PM-7AM, please contact night-coverage at [email or web address], Office  [phone]   08/25/2020, 2:17 PM  LOS: 7 days

## 2020-08-25 NOTE — TOC Initial Note (Signed)
Transition of Care [facility]) - Initial/Assessment Note    Patient Details  Name: Kristin [name] MRN: [id number] Date of Birth: [birth year]  [facility]) CM/SW Contact:    Trula Ore, [place] Phone Number: 08/25/2020, 2:02 PM  Clinical Narrative:                   CSW received consult for possible SNF placement at time of discharge. CSW spoke with patient and patients daughter at bedside regarding PT recommendation of SNF placement at time of discharge. Patient comes from home with daughter. Patient and patients daughter expressed understanding of PT recommendation and is agreeable to SNF placement at time of discharge. Patient and patients daughter reports preference for initial referral to be sent out near [place] area . CSW discussed insurance authorization process and   Let patient and patients daughter know that CSW will provide Medicare SNF ratings list once bed offers are in. Patient has received the COVID vaccines. No further questions reported at this time. CSW to continue to follow and assist with discharge planning needs.  Expected Discharge Plan: Skilled Nursing Facility Barriers to Discharge: Continued Medical Work up   Patient Goals and CMS Choice Patient states their goals for this hospitalization and ongoing recovery are:: to go to SNF CMS Medicare.gov Compare Post Acute Care list provided to:: Patient Choice offered to / list presented to : Patient  Expected Discharge Plan and Services Expected Discharge Plan: [place]   Discharge Planning Services: CM Consult Post Acute Care Choice: [place] arrangements for the past 2 months: Single Family Home                           HH Arranged: PT, OT [place] Agency: [facility] [employer]) Date [place]: 08/22/20 Time [place]: [id number] Representative spoke with at [place]: [place]  Prior Living Arrangements/Services Living arrangements  for the past 2 months: [place] with:: Self, Adult Children Patient language and need for interpreter reviewed:: Yes Do you feel safe going back to the place where you live?: No   SNF  Need for Family Participation in Patient Care: Yes (Comment) Care giver support system in place?: Yes (comment) Current home services: DME (cane, walker) Criminal Activity/Legal Involvement Pertinent to Current Situation/Hospitalization: No - Comment as needed  Activities of Daily Living Home Assistive Devices/Equipment: Cane (specify quad or straight), Eyeglasses, Scales, Walker (specify type), Wheelchair ADL Screening (condition at time of admission) Patient's cognitive ability adequate to safely complete daily activities?: Yes Is the patient deaf or have difficulty hearing?: No Does the patient have difficulty seeing, even when wearing glasses/contacts?: No Does the patient have difficulty concentrating, remembering, or making decisions?: No Patient able to express need for assistance with ADLs?: Yes Does the patient have difficulty dressing or bathing?: Yes Independently performs ADLs?: No Communication: Independent Dressing (OT): Needs assistance Is this a change from baseline?: Change from baseline, expected to last <3days Grooming: Needs assistance Is this a change from baseline?: Change from baseline, expected to last <3 days Feeding: Independent Bathing: Needs assistance Is this a change from baseline?: Change from baseline, expected to last <3 days Toileting: Needs assistance Is this a change from baseline?: Change from baseline, expected to last <3 days In/Out Bed: Dependent Is this a change from baseline?: Change from baseline, expected to last <3 days Walks in Home: Needs assistance Is this a change from  baseline?: Change from baseline, expected to last <3 days Does the patient have difficulty walking or climbing stairs?: Yes Weakness of Legs: Both Weakness of Arms/Hands:  None  Permission Sought/Granted Permission sought to share information with : Case Manager, Family Supports, Chartered certified accountant granted to share information with : Yes, Verbal Permission Granted  Share Information with NAME: [place]  Permission granted to share info w AGENCY: SNF  Permission granted to share info w Relationship: daughter  Permission granted to share info w Contact Information: Rema Fendt [phone]  Emotional Assessment Appearance:: Appears stated age Attitude/Demeanor/Rapport: Gracious Affect (typically observed): Calm Orientation: : Oriented to Self, Oriented to Place, Oriented to  Time, Oriented to Situation Alcohol / Substance Use: Not Applicable Psych Involvement: No (comment)  Admission diagnosis:  SIRS (systemic inflammatory response syndrome) (HCC) [R65.10] Lower extremity weakness [R29.898] Fever, unspecified fever cause [R50.9] Patient Active Problem List   Diagnosis Date Noted  . 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 AKI (acute kidney injury) ([place])   . Atherosclerosis of native coronary artery of native heart without angina pectoris   . NSTEMI (non-ST elevated myocardial infarction) ([place])   . Atrial fibrillation ([place])   . Mixed hyperlipidemia   . Elevated troponin   . Pulmonary edema   . Acute HFrEF (heart failure with reduced ejection fraction) ([place])   . Cardiomyopathy ([place])   . Effusion of right knee joint   . SIRS (systemic inflammatory response syndrome) ([place]) 08/18/2020  . Lower extremity weakness 08/18/2020  . Hypertension   . Chronic low back pain with bilateral sciatica    PCP:  Caren Macadam, MD Pharmacy:   [facility] [address] [place]), [place] [address] [place] [address] Phone: [phone] Fax: [phone]     Social Determinants of Health (SDOH) Interventions    Readmission Risk Interventions No flowsheet data  found.

## 2020-08-25 NOTE — [place] FL2 (Signed)
[place] MEDICAID FL2 LEVEL OF CARE SCREENING TOOL     IDENTIFICATION  Patient Name: Kristin [name] Birthdate: 1941-02-21 Sex: female Admission Date (Current Location): 08/18/2020  [facility] and [place] Number:  Herbalist and Address:  The . [facility], [place] [address], [place],  [address]      Provider Number: [id number]  Attending Physician Name and Address:  Oswald Hillock, MD  Relative Name and Phone Number:  Rema Fendt [phone]    Current Level of Care: Hospital Recommended Level of Care: [place] Prior Approval Number:    Date Approved/Denied:   PASRR Number: [id number]  Discharge Plan: SNF    Current Diagnoses: Patient Active Problem List   Diagnosis Date Noted  . 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 AKI (acute kidney injury) ([place])   . Atherosclerosis of native coronary artery of native heart without angina pectoris   . NSTEMI (non-ST elevated myocardial infarction) ([place])   . Atrial fibrillation ([place])   . Mixed hyperlipidemia   . Elevated troponin   . Pulmonary edema   . Acute HFrEF (heart failure with reduced ejection fraction) ([place])   . Cardiomyopathy ([place])   . Effusion of right knee joint   . SIRS (systemic inflammatory response syndrome) ([place]) 08/18/2020  . Lower extremity weakness 08/18/2020  . Hypertension   . Chronic low back pain with bilateral sciatica     Orientation RESPIRATION BLADDER Height & Weight     Self, Time, Situation, Place  O2 (Nasal Cannula 8 liters) Incontinent Weight: 196 lb 6.4 oz (89.1 kg) Height:  5\' 4"  (162.6 cm)  BEHAVIORAL SYMPTOMS/MOOD NEUROLOGICAL BOWEL NUTRITION STATUS      Continent Diet (See Discharge Summary)  AMBULATORY STATUS COMMUNICATION OF NEEDS Skin   Limited Assist Verbally Surgical wounds, Other (Comment) (Appropriate for ethnicity,Incision closed back;clean;dry;staples)                       Personal  Care Assistance Level of Assistance  Bathing, Feeding, Dressing Bathing Assistance: Limited assistance Feeding assistance: Independent (able to feed self) Dressing Assistance: Limited assistance     Functional Limitations Info  Sight, Hearing, Speech Sight Info: Impaired Hearing Info: Adequate Speech Info: Adequate    SPECIAL CARE FACTORS FREQUENCY  PT (By licensed PT), OT (By licensed OT)     PT Frequency: 5x min weekly OT Frequency: 5x min weekly            Contractures Contractures Info: Not present    Additional Factors Info  Code Status Code Status Info: FULL             Current Medications (08/25/2020):  This is the current hospital active medication list Current Facility-Administered Medications  Medication Dose Route Frequency Provider Last Rate Last Admin  . 0.9 %  sodium chloride infusion  250 mL Intravenous PRN [name], MD   Paused at 08/24/20 1757  . acetaminophen (TYLENOL) tablet 650 mg  650 mg Oral Q6H PRN [name], MD       Or  . acetaminophen (TYLENOL) suppository 650 mg  650 mg Rectal Q6H PRN [name], MD      . alum & mag hydroxide-simeth (MAALOX/MYLANTA) 200-200-20 MG/5ML suspension 30 mL  30 mL Oral Q6H PRN [name], MD   30 mL at 08/22/20 0830  . amiodarone (PACERONE) tablet 200 mg  200 mg Oral Daily Tolia, Sunit, DO   200  mg at 08/25/20 0934  . bisacodyl (DULCOLAX) suppository 10 mg  10 mg Rectal Daily PRN [name], MD      . bumetanide (BUMEX) injection 1 mg  1 mg Intravenous Q12H Tolia, Sunit, DO   1 mg at 08/25/20 0933  . cholecalciferol (VITAMIN D3) tablet 3,000 Units  3,000 Units Oral Daily [name], MD   3,000 Units at 08/25/20 0933  . cloNIDine (CATAPRES - Dosed in mg/24 hr) patch 0.1 mg  0.1 mg Transdermal Weekly [name], MD   0.1 mg at 08/19/20 7673  . colchicine tablet 0.3 mg  0.3 mg Oral Daily [name], MD   0.3 mg at 08/25/20 0933  . diclofenac Sodium (VOLTAREN) 1 % topical gel 2 g  2 g Topical QID [name], MD   2 g  at 08/25/20 1100  . heparin ADULT infusion 100 units/mL (25000 units/265mL sodium chloride 0.45%)  950 Units/hr Intravenous Continuous Lyndee Leo, RPH 9.5 mL/hr at 08/25/20 0901 950 Units/hr at 08/25/20 0901  . hydrALAZINE (APRESOLINE) injection 10 mg  10 mg Intravenous Q6H PRN [name], MD      . HYDROcodone-acetaminophen (NORCO/VICODIN) 5-325 MG per tablet 1 tablet  1 tablet Oral Q4H PRN [name], MD   1 tablet at 08/22/20 1026  . HYDROmorphone (DILAUDID) injection 0.5 mg  0.5 mg Intravenous Q2H PRN [name], MD   0.5 mg at 08/21/20 1625  . isosorbide-hydrALAZINE (BIDIL) 20-37.5 MG per tablet 1 tablet  1 tablet Oral TID Tolia, Sunit, DO   1 tablet at 08/25/20 0934  . menthol-cetylpyridinium (CEPACOL) lozenge 3 mg  1 lozenge Oral PRN [name], MD       Or  . phenol (CHLORASEPTIC) mouth spray 1 spray  1 spray Mouth/Throat PRN [name], MD      . methocarbamol (ROBAXIN) tablet 500 mg  500 mg Oral Q6H PRN [name], MD   500 mg at 08/20/20 2132   Or  . methocarbamol (ROBAXIN) 500 mg in dextrose 5 % 50 mL IVPB  500 mg Intravenous Q6H PRN [name], MD      . metoprolol tartrate (LOPRESSOR) tablet 25 mg  25 mg Oral BID [name], MD   25 mg at 08/25/20 0934  . morphine 2 MG/ML injection 2 mg  2 mg Intravenous Q2H PRN [name], MD   2 mg at 08/22/20 0220  . nitroGLYCERIN (NITROSTAT) SL tablet 0.4 mg  0.4 mg Sublingual Q5 min PRN [name], MD   0.4 mg at 08/22/20 1459  . nitroGLYCERIN 50 mg in dextrose 5 % 250 mL (0.2 mg/mL) infusion  0-200 mcg/min Intravenous Titrated [name], MD   Stopped at 08/25/20 0902  . ondansetron (ZOFRAN) tablet 4 mg  4 mg Oral Q6H PRN [name], MD       Or  . ondansetron [facility]) injection 4 mg  4 mg Intravenous Q6H PRN [name], MD      . oxyCODONE-acetaminophen (PERCOCET/ROXICET) 5-325 MG per tablet 1 tablet  1 tablet Oral BID PRN [name], MD   1 tablet at 08/18/20 0945  . pantoprazole (PROTONIX) EC tablet 40 mg  40 mg Oral BID [name], MD    40 mg at 08/25/20 0934  . polyethylene glycol (MIRALAX / GLYCOLAX) packet 17 g  17 g Oral BID [name], MD   17 g at 08/25/20 0933  . potassium chloride SA (KLOR-CON) CR tablet 20 mEq  20 mEq Oral BID [name], MD  20 mEq at 08/25/20 1224  . rosuvastatin (CRESTOR) tablet 40 mg  40 mg Oral QHS [name], MD   40 mg at 08/24/20 2210  . senna-docusate (Senokot-S) tablet 1 tablet  1 tablet Oral BID [name], MD   1 tablet at 08/25/20 0934  . sodium chloride flush (NS) 0.9 % injection 10-40 mL  10-40 mL Intracatheter Q12H [name], MD   10 mL at 08/23/20 2203  . sodium chloride flush (NS) 0.9 % injection 10-40 mL  10-40 mL Intracatheter PRN [name], MD      . sodium chloride flush (NS) 0.9 % injection 3 mL  3 mL Intravenous Q12H [name], MD      . sodium chloride flush (NS) 0.9 % injection 3 mL  3 mL Intravenous Q12H [name], MD   3 mL at 08/24/20 2215  . sodium chloride flush (NS) 0.9 % injection 3 mL  3 mL Intravenous PRN [name], MD   3 mL at 08/23/20 1446  . sucralfate (CARAFATE) 1 GM/10ML suspension 1 g  1 g Oral TID WC & HS [name], MD   1 g at 08/25/20 1224     Discharge Medications: Please see discharge summary for a list of discharge medications.  Relevant Imaging Results:  Relevant Lab Results:   Additional Information [id number]  Trula Ore, LCSWA

## 2020-08-25 NOTE — Progress Notes (Signed)
ANTICOAGULATION CONSULT NOTE   Pharmacy Consult for Heparin Indication: ACS/afib  No Known Allergies  Patient Measurements: Height: 5\' 4"  (162.6 cm) Weight: 89.1 kg (196 lb 6.4 oz) IBW/kg (Calculated) : 54.7 Heparin Dosing Weight: 75 kg  Vital Signs: Temp: 99.4 F (37.4 C) (09/17 0322) Temp Source: Oral (09/17 0322) BP: 110/90 (09/17 0322) Pulse Rate: 90 (09/17 0322)  Labs: Recent Labs    08/22/20 1145 08/22/20 1145 08/22/20 1323 08/22/20 1652 08/22/20 1759 08/22/20 1830 08/23/20 0056 08/23/20 1000 08/23/20 1312 08/24/20 0155 08/24/20 1205 08/24/20 1205 08/24/20 1346 08/24/20 2220 08/25/20 0637  HGB 9.5*  --   --   --   --   --   --    < > 9.2*  --  9.4*  --   --   --  9.2*  HCT 30.2*  --   --   --   --   --   --    < > 27.0*  --  29.3*  --   --   --  28.7*  PLT 372  --   --   --   --   --   --   --   --   --  305  --   --   --  327  APTT  --   --   --   --   --   --  89*   < >  --    < > 171*   < > 70* 71* 43*  CREATININE  --   --   --  1.17*   < >  --  1.18*  --   --   --  1.66*  --   --   --  1.83*  TROPONINIHS >27,000*   < > >27,000* >27,000*  --  >27,000*  --   --   --   --   --   --   --   --   --    < > = values in this interval not displayed.    Estimated Creatinine Clearance: 26.5 mL/min (A) (by C-G formula based on SCr of 1.83 mg/dL (H)).  Assessment: 79 yo female with new onset ACS per telephone conversation with Dr. Terri Skains of Cardiology. Patient has had a recent Neurosurgical procedure, therefore Neurosurgery is requesting no boluses and a tight APTT goal of 50-60.   PTT 41 sec (slightly below goal) this morning on gtt at 900 units/hr. Adjusted down overnight, aptt's have been very labile, will make small adjustment this morning. No issues with line or bleeding reported per RN.  Goal of Therapy:  APTT 50-60 sec per neurosurgery Monitor platelets by anticoagulation protocol: Yes   Plan:  Increase heparin drip to 950 units/hr  aPTT in 8 hours -  peripheral draw  Erin Hearing PharmD., BCPS Clinical Pharmacist 08/25/2020 8:51 AM

## 2020-08-25 NOTE — Progress Notes (Signed)
Neurosurgery Service Progress Note  Subjective: No acute events overnight, states her legs feel 'great', sig improved from preop, back pain is minimal   Objective: Vitals:   08/25/20 0000 08/25/20 0322 08/25/20 0934 08/25/20 1052  BP: 128/78 110/90 (!) 99/55 108/62  Pulse: 84 90  91  Resp: 16 17  16   Temp: 97.6 F (36.4 C) 99.4 F (37.4 C)  98.2 F (36.8 C)  TempSrc: Oral Oral  Oral  SpO2: 100% 100%  100%  Weight:  89.1 kg    Height:       Temp (24hrs), Avg:98.1 F (36.7 C), Min:97.6 F (36.4 C), Max:99.4 F (37.4 C)  CBC Latest Ref Rng & Units 08/25/2020 08/24/2020 08/23/2020  WBC 4.0 - 10.5 K/uL 11.6(H) 11.2(H) -  Hemoglobin 12.0 - 15.0 g/dL 9.2(L) 9.4(L) 9.2(L)  Hematocrit 36 - 46 % 28.7(L) 29.3(L) 27.0(L)  Platelets 150 - 400 K/uL 327 305 -   BMP Latest Ref Rng & Units 08/25/2020 08/24/2020 08/23/2020  Glucose 70 - 99 mg/dL 144(H) 189(H) -  BUN 8 - 23 mg/dL 36(H) 37(H) -  Creatinine 0.44 - 1.00 mg/dL 1.83(H) 1.66(H) -  BUN/Creat Ratio 12 - 28 - - -  Sodium 135 - 145 mmol/L 134(L) 132(L) 135  Potassium 3.5 - 5.1 mmol/L 4.1 3.5 2.9(L)  Chloride 98 - 111 mmol/L 97(L) 94(L) -  CO2 22 - 32 mmol/L 29 28 -  Calcium 8.9 - 10.3 mg/dL 8.5(L) 8.4(L) -    Intake/Output Summary (Last 24 hours) at 08/25/2020 1057 Last data filed at 08/25/2020 0700 Gross per 24 hour  Intake 931.46 ml  Output --  Net 931.46 ml    Current Facility-Administered Medications:  .  0.9 %  sodium chloride infusion, 250 mL, Intravenous, PRN, [name], MD, Paused at 08/24/20 1757 .  acetaminophen (TYLENOL) tablet 650 mg, 650 mg, Oral, Q6H PRN **OR** acetaminophen (TYLENOL) suppository 650 mg, 650 mg, Rectal, Q6H PRN, [name], MD .  alum & mag hydroxide-simeth (MAALOX/MYLANTA) 200-200-20 MG/5ML suspension 30 mL, 30 mL, Oral, Q6H PRN, [name], MD, 30 mL at 08/22/20 0830 .  amiodarone (PACERONE) tablet 200 mg, 200 mg, Oral, Daily, Tolia, Sunit, DO, 200 mg at 08/25/20 0934 .  bisacodyl (DULCOLAX)  suppository 10 mg, 10 mg, Rectal, Daily PRN, [name], MD .  bumetanide (BUMEX) injection 1 mg, 1 mg, Intravenous, Q12H, Tolia, Sunit, DO, 1 mg at 08/25/20 0933 .  cholecalciferol (VITAMIN D3) tablet 3,000 Units, 3,000 Units, Oral, Daily, [name], MD, 3,000 Units at 08/25/20 0933 .  cloNIDine (CATAPRES - Dosed in mg/24 hr) patch 0.1 mg, 0.1 mg, Transdermal, Weekly, [name], MD, 0.1 mg at 08/19/20 5027 .  colchicine tablet 0.3 mg, 0.3 mg, Oral, Daily, [name], MD, 0.3 mg at 08/25/20 0933 .  diclofenac Sodium (VOLTAREN) 1 % topical gel 2 g, 2 g, Topical, QID, [name], MD, 2 g at 08/24/20 2211 .  heparin ADULT infusion 100 units/mL (25000 units/244mL sodium chloride 0.45%), 950 Units/hr, Intravenous, Continuous, Lyndee Leo, [facility], Last Rate: 9.5 mL/hr at 08/25/20 0901, 950 Units/hr at 08/25/20 0901 .  hydrALAZINE (APRESOLINE) injection 10 mg, 10 mg, Intravenous, Q6H PRN, [name], MD .  HYDROcodone-acetaminophen (NORCO/VICODIN) 5-325 MG per tablet 1 tablet, 1 tablet, Oral, Q4H PRN, [name], MD, 1 tablet at 08/22/20 1026 .  HYDROmorphone (DILAUDID) injection 0.5 mg, 0.5 mg, Intravenous, Q2H PRN, [name], MD, 0.5 mg at 08/21/20 1625 .  isosorbide-hydrALAZINE (BIDIL) 20-37.5 MG per tablet 1 tablet, 1 tablet,  Oral, TID, Tolia, Sunit, DO, 1 tablet at 08/25/20 0934 .  menthol-cetylpyridinium (CEPACOL) lozenge 3 mg, 1 lozenge, Oral, PRN **OR** phenol (CHLORASEPTIC) mouth spray 1 spray, 1 spray, Mouth/Throat, PRN, [name], MD .  methocarbamol (ROBAXIN) tablet 500 mg, 500 mg, Oral, Q6H PRN, 500 mg at 08/20/20 2132 **OR** methocarbamol (ROBAXIN) 500 mg in dextrose 5 % 50 mL IVPB, 500 mg, Intravenous, Q6H PRN, [name], MD .  metoprolol tartrate (LOPRESSOR) tablet 25 mg, 25 mg, Oral, BID, [name], MD, 25 mg at 08/25/20 0934 .  morphine 2 MG/ML injection 2 mg, 2 mg, Intravenous, Q2H PRN, [name], MD, 2 mg at 08/22/20 0220 .  nitroGLYCERIN (NITROSTAT) SL tablet 0.4 mg, 0.4 mg,  Sublingual, Q5 min PRN, [name], MD, 0.4 mg at 08/22/20 1459 .  nitroGLYCERIN 50 mg in dextrose 5 % 250 mL (0.2 mg/mL) infusion, 0-200 mcg/min, Intravenous, Titrated, [name], MD, Stopped at 08/25/20 0902 .  ondansetron (ZOFRAN) tablet 4 mg, 4 mg, Oral, Q6H PRN **OR** ondansetron (ZOFRAN) injection 4 mg, 4 mg, Intravenous, Q6H PRN, [name], MD .  oxyCODONE-acetaminophen (PERCOCET/ROXICET) 5-325 MG per tablet 1 tablet, 1 tablet, Oral, BID PRN, [name], MD, 1 tablet at 08/18/20 0945 .  pantoprazole (PROTONIX) EC tablet 40 mg, 40 mg, Oral, BID, [name], MD, 40 mg at 08/25/20 0934 .  polyethylene glycol (MIRALAX / GLYCOLAX) packet 17 g, 17 g, Oral, BID, [name], MD, 17 g at 08/25/20 0933 .  potassium chloride SA (KLOR-CON) CR tablet 20 mEq, 20 mEq, Oral, BID, [name], MD, 20 mEq at 08/24/20 2210 .  rosuvastatin (CRESTOR) tablet 40 mg, 40 mg, Oral, QHS, [name], MD, 40 mg at 08/24/20 2210 .  senna-docusate (Senokot-S) tablet 1 tablet, 1 tablet, Oral, BID, [name], MD, 1 tablet at 08/25/20 0934 .  sodium chloride flush (NS) 0.9 % injection 10-40 mL, 10-40 mL, Intracatheter, Q12H, [name], MD, 10 mL at 08/23/20 2203 .  sodium chloride flush (NS) 0.9 % injection 10-40 mL, 10-40 mL, Intracatheter, PRN, [name], MD .  sodium chloride flush (NS) 0.9 % injection 3 mL, 3 mL, Intravenous, Q12H, [name], MD .  sodium chloride flush (NS) 0.9 % injection 3 mL, 3 mL, Intravenous, Q12H, [name], MD, 3 mL at 08/24/20 2215 .  sodium chloride flush (NS) 0.9 % injection 3 mL, 3 mL, Intravenous, PRN, [name], MD, 3 mL at 08/23/20 1446 .  sucralfate (CARAFATE) 1 GM/10ML suspension 1 g, 1 g, Oral, TID WC & HS, [name], MD, 1 g at 08/25/20 9326   Physical Exam: Strength 5/5x4, SILTx4, incision c/d/i with no e/o swelling/bruising  Assessment & Plan: 79 y.o. woman s/p L2-4 laminectomies for progressive weakness w/ NSTEMI, recovering well.  -no change in neurosurgical plan of  care, okay with continuing heparin gtt with plan to treat CV disease early next week  Judith Part  08/25/20 10:57 AM

## 2020-08-25 NOTE — Progress Notes (Signed)
Progress Note  Patient Name: Kristin [name] Date of Encounter: 08/25/2020  Attending physician: Oswald Hillock, MD Primary care provider: Caren Macadam, MD Primary Cardiologist: NA Consultant:Kemba Hoppes Azalia Bilis, [facility]  Subjective: Kristin [name] is a 79 y.o. female who was seen and examined at bedside at approximately 8:45 AM. No family present at bedside. Sitting upright having breakfast. She denies any chest pain and shortness of breath She is able to move all 4 extremities and follow to commands.  Objective: Vital Signs in the last 24 hours: Temp:  [97.6 F (36.4 C)-99.4 F (37.4 C)] 99.4 F (37.4 C) (09/17 0322) Pulse Rate:  [74-90] 90 (09/17 0322) Resp:  [14-17] 17 (09/17 0322) BP: (104-136)/(69-101) 110/90 (09/17 0322) SpO2:  [95 %-100 %] 100 % (09/17 0322) Weight:  [89.1 kg] 89.1 kg (09/17 0322)  Intake/Output:  Intake/Output Summary (Last 24 hours) at 08/25/2020 0913 Last data filed at 08/25/2020 0700 Gross per 24 hour  Intake 1880.46 ml  Output --  Net 1880.46 ml    Net IO Since Admission: 5,185.92 mL [08/25/20 0913]  Weights:  Filed Weights   08/20/20 0436 08/24/20 0500 08/25/20 0322  Weight: 93.3 kg 91.9 kg 89.1 kg    Telemetry: Normal sinus rhythm.  Physical examination: PHYSICAL EXAM: Vitals with BMI 08/25/2020 08/25/2020 08/24/2020  Height - - -  Weight 196 lbs 6 oz - -  BMI 97.3 - -  Systolic 532 992 426  Diastolic 90 78 71  Pulse 90 84 84   CONSTITUTIONAL: Well-developed and well-nourished. No acute distress.  Hemodynamically stable. Sitting upright in her hospital bed. SKIN: Skin is warm and dry. No rash noted.  HEAD: Normocephalic and atraumatic.  EYES: No scleral icterus MOUTH/THROAT: Dry oral membranes.  NECK: JVP present. No thyromegaly noted. No carotid bruits  LYMPHATIC: No visible cervical adenopathy.  CHEST Normal respiratory effort. No intercostal retractions  LUNGS: Clear to auscultation in the upper lung fields with rales noted  at the bases.  No expiratory wheezes. CARDIOVASCULAR: Regular rate and rhythm, positive S3-M1 soft holosystolic murmur heard at the apex, no gallops or rubs. ABDOMINAL: Obese, soft, nontender, distended, positive bowel sounds in all 4 quadrants, no apparent ascites.  EXTREMITIES: No peripheral edema, warm to touch bilaterally.  Able to lift both LE against gravity. HEMATOLOGIC: No significant bruising NEUROLOGIC: Oriented to person, place, and time. Nonfocal. Normal muscle tone.  PSYCHIATRIC: Normal mood and affect. Normal behavior. Cooperative  Lab Results: Hematology Recent Labs  Lab 08/22/20 1145 08/23/20 1303 08/23/20 1312 08/24/20 1205 08/25/20 0637  WBC 18.7*  --   --  11.2* 11.6*  RBC 3.35*  --   --  3.29* 3.16*  HGB 9.5*   < > 9.2* 9.4* 9.2*  HCT 30.2*   < > 27.0* 29.3* 28.7*  MCV 90.1  --   --  89.1 90.8  MCH 28.4  --   --  28.6 29.1  MCHC 31.5  --   --  32.1 32.1  RDW 12.9  --   --  13.2 13.2  PLT 372  --   --  305 327   < > = values in this interval not displayed.    Chemistry Recent Labs  Lab 08/22/20 1759 08/22/20 1759 08/23/20 0056 08/23/20 1303 08/23/20 1312 08/24/20 1205 08/25/20 0637  NA 130*   < > 130*   < > 135 132* 134*  K 3.5   < > 3.6   < > 2.9* 3.5 4.1  CL 94*   < >  95*  --   --  94* 97*  CO2 21*   < > 22  --   --  28 29  GLUCOSE 178*   < > 190*  --   --  189* 144*  BUN 34*   < > 35*  --   --  37* 36*  CREATININE 1.13*   < > 1.18*  --   --  1.66* 1.83*  CALCIUM 9.0   < > 8.6*  --   --  8.4* 8.5*  PROT 7.2  --   --   --   --   --   --   ALBUMIN 2.8*  --   --   --   --   --   --   AST 190*  --   --   --   --   --   --   ALT 66*  --   --   --   --   --   --   ALKPHOS 79  --   --   --   --   --   --   BILITOT 0.5  --   --   --   --   --   --   GFRNONAA 46*   < > 44*  --   --  29* 26*  GFRAA 53*   < > 50*  --   --  33* 30*  ANIONGAP 15   < > 13  --   --  10 8   < > = values in this interval not displayed.     Cardiac Enzymes: Cardiac Panel  (last 3 results) Recent Labs    08/22/20 1323 08/22/20 1652 08/22/20 1830  TROPONINIHS >27,000* >27,000* >27,000*    BNP (last 3 results) Recent Labs    08/23/20 0056 08/24/20 1205 08/25/20 0637  BNP 1,088.7* 704.9* 517.4*    ProBNP (last 3 results) No results for input(s): PROBNP in the last 8760 hours.   DDimer No results for input(s): DDIMER in the last 168 hours.   Hemoglobin A1c: No results found for: HGBA1C, MPG  TSH  Recent Labs    03/13/20 0949 08/22/20 1759  TSH 1.190 1.054    Lipid Panel No results found for: CHOL, TRIG, HDL, CHOLHDL, VLDL, LDLCALC, LDLDIRECT  Imaging: CARDIAC CATHETERIZATION  Result Date: 08/23/2020 Left and right heart catheterization 08/23/2020: RA 13/15, mean 12, RA saturation 56%; RV 51/8, EDP 12 mmHg; PA 54/26, mean 38 mmHg, PA saturation 45%; PW 21/21, mean 20 mmHg, aortic saturation 97%. Cardiac output 4.96, cardiac index 2.51.  SVR elevated at 1258.  PVR mildly elevated at 290.  QP/QS 0.79. Left main: Mild disease. LAD: Proximal 80% stenosis followed by 100% occlusion.  Before the occlusion, at the subtotal site, there is a large diagonal that comes off and has secondary branches.  Apical LAD is barely visualized with minimal amount of collaterals.  Thrombus is evident at the site of occlusion. Circumflex: Large vessel, moderate-sized OM1 and moderate-sized OM 2.  Minimal disease. RCA: Ostial 80 to 90% stenosis.  Mild disease in the remaining of the vessel, PL branch has a 50% stenosis of the proximal and ostial segment.  Severe dampening was evident with engagement of 5 French diagnostic catheter. LV: EDP mildly elevated at 15- 18 mmHg.  No pressure gradient across the aortic valve.  LVEF 30 to 35% with entire anterior, anterolateral, apical and apical inferior akinesis.  No significant MR. Impression: Patient's presentation is  consistent with acute systolic heart failure from recent acute myocardial infarction.  Patient is postop spine  surgery and not a candidate for anticoagulation with full dose or with antiplatelet therapy for now in view of risk of development of spine hematoma and paraparesis.  Also the infarct appears to have been completed.  As her EDP is only 15 and wedge although shows mild elevation at 20, she is saturating well and heart rate is much better improved and controlled with regard to atrial fibrillation.  Hence I did not proceed with implantation of intra-aortic balloon pump.  Will be aggressive with fluid management on the floor and watch her carefully.  Have a low threshold for intubation if necessary.  I suspect she probably will recover as it has been greater than 24 hours since her infarct.   Cardiac database: EKG: 08/18/2020: Normal sinus rhythm, 94 bpm, normal axis, without underlying injury pattern.  08/22/2020 1139: Atrial fibrillation, 95 bpm, PRWP, T wave inversions in lead I and aVL suggestive of high lateral ischemia, without underlying injury pattern.  08/22/2020 1332: Atrial fibrillation, 93 bpm,  poor R wave progression, and possible anteroseptal injury pattern.  Poor R wave progression and ST changes are new compared to EKG 08/18/2020.  Echocardiogram: ECHO COMPLETE WITH IMAGING ENHANCING AGENT 08/22/2020 1. Left ventricular ejection fraction, by estimation, is 30 to 35%. The left ventricle has moderately decreased function. The left ventricle demonstrates regional wall motion abnormalities. Hyokinesis of base to mid anterior wall, base to mid anterospetal wall, apical septal, and apex. There is moderate left ventricular hypertrophy. Diastolic function is not assessed due to atrial fibrillation. No apparent left ventricular thrombus. 2. Right ventricular systolic function is normal. The right ventricular size is normal. There is moderately elevated pulmonary artery systolic pressure. The estimated right ventricular systolic pressure is 65.7 mmHg. 3. The mitral valve is normal in structure. Mild  mitral valve regurgitation. No evidence of mitral stenosis. 4. The aortic valve is normal in structure. Aortic valve regurgitation is not visualized. Mild aortic valve sclerosis is present, with no evidence of aortic valve stenosis. 5. The inferior vena cava is dilated in size with >50% respiratory variability, suggesting right atrial pressure of 8 mmHg.  Scheduled Meds: . amiodarone  200 mg Oral Daily  . bumetanide (BUMEX) IV  1 mg Intravenous Q12H  . cholecalciferol  3,000 Units Oral Daily  . cloNIDine  0.1 mg Transdermal Weekly  . colchicine  0.3 mg Oral Daily  . diclofenac Sodium  2 g Topical QID  . isosorbide-hydrALAZINE  1 tablet Oral TID  . metoprolol tartrate  25 mg Oral BID  . pantoprazole  40 mg Oral BID  . polyethylene glycol  17 g Oral BID  . potassium chloride  20 mEq Oral BID  . rosuvastatin  40 mg Oral QHS  . senna-docusate  1 tablet Oral BID  . sodium chloride flush  10-40 mL Intracatheter Q12H  . sodium chloride flush  3 mL Intravenous Q12H  . sodium chloride flush  3 mL Intravenous Q12H  . sucralfate  1 g Oral TID WC & HS    Continuous Infusions: . sodium chloride Stopped (08/24/20 1757)  . heparin 950 Units/hr (08/25/20 0901)  . methocarbamol (ROBAXIN) IV    . nitroGLYCERIN Stopped (08/25/20 0902)    PRN Meds: sodium chloride, acetaminophen **OR** acetaminophen, alum & mag hydroxide-simeth, bisacodyl, hydrALAZINE, HYDROcodone-acetaminophen, HYDROmorphone (DILAUDID) injection, menthol-cetylpyridinium **OR** phenol, methocarbamol **OR** methocarbamol (ROBAXIN) IV, morphine injection, nitroGLYCERIN, ondansetron **OR** ondansetron (ZOFRAN) IV, oxyCODONE-acetaminophen, sodium  chloride flush, sodium chloride flush   IMPRESSION & RECOMMENDATIONS: Kristin [name] is a 79 y.o. female whose past medical history and cardiac risk factors include: NSTEMI, Acute pulmonary edema, Acute HFrEF, cardiomyopathy, Atrial fibrillation (new onset),  Lumbar stenosis status post  laminectomy, hypertension, CKD, diabetes, postmenopausal female, advanced age.   Impression:  NSTEMI  Currently on heparin gtt with goal aPTT between 50-60 per neurosurgery recommendations. Pharmacy to dose.    Currently on BiPAP as needed.  Continue statin  Continue home dose beta blockers.   Hold IV nitro drip for now as the patient's euvolemic, BNP trending down, sitting upright, requiring less BiPAP support    Transition from IV Lasix to IV Bumex, if Cr continues to trend up tomorrow would recommend holding/decreasing diuresis to stabilize renal function as patient will need angiography next week.   Hold Entresto, due to acute kidney injury  Continue BiDil  Daily weights trending down, I&O not strict.   Acute HFrEF: see above.   Acute Pulmonary Edema: continue BiPAP and diuresis, strict I and Os, daily weights.   Ischemic cardiomyopathy: See above  Established coronary artery disease without angina pectoris:  Not on antiplatelet therapy per neurosurgery recommendations.  Continue guideline directed medical therapy as discussed above.  Acute kidney injury: Multifactorial due to diuresis and recent contrast use  Avoid nephrotoxic agents  Hold Entresto transition to BiDil  Continue to monitor BUN and creatinine.  Paroxysmal atrial fibrillation:  Currently in normal sinus rhythm.  Transition IV amiodarone drip to oral 200 mg p.o. daily  On IV heparin with a goal PTT between 50-60 per neurosurgery recommendations.Pharmacy to dose.    CHA2DS2-VASc SCORE is 5 which correlates to 6.7 % risk of stroke per year (hypertension, age greater than 25, diabetes, female sex).  Continue telemetry.   Hyperlipidemia: Continue statin therapy.  Hypertension: Currently managed by primary team.  Patient's questions and concerns were addressed to her satisfaction. She voices understanding of the instructions provided during this encounter.   This note was created using a  voice recognition software as a result there may be grammatical errors inadvertently enclosed that do not reflect the nature of this encounter. Every attempt is made to correct such errors.  Rex Kras, DO, [facility]. [place] Office: [phone] 08/25/2020, 9:13 AM

## 2020-08-26 ENCOUNTER — Other Ambulatory Visit: Payer: Self-pay

## 2020-08-26 LAB — CBC
HCT: 26.2 % — ABNORMAL LOW (ref 36.0–46.0)
Hemoglobin: 8.4 g/dL — ABNORMAL LOW (ref 12.0–15.0)
MCH: 29 pg (ref 26.0–34.0)
MCHC: 32.1 g/dL (ref 30.0–36.0)
MCV: 90.3 fL (ref 80.0–100.0)
Platelets: 344 10*3/uL (ref 150–400)
RBC: 2.9 MIL/uL — ABNORMAL LOW (ref 3.87–5.11)
RDW: 13.5 % (ref 11.5–15.5)
WBC: 11.9 10*3/uL — ABNORMAL HIGH (ref 4.0–10.5)
nRBC: 0 % (ref 0.0–0.2)

## 2020-08-26 LAB — BASIC METABOLIC PANEL
Anion gap: 8 (ref 5–15)
BUN: 38 mg/dL — ABNORMAL HIGH (ref 8–23)
CO2: 29 mmol/L (ref 22–32)
Calcium: 8.7 mg/dL — ABNORMAL LOW (ref 8.9–10.3)
Chloride: 102 mmol/L (ref 98–111)
Creatinine, Ser: 1.87 mg/dL — ABNORMAL HIGH (ref 0.44–1.00)
GFR calc Af Amer: 29 mL/min — ABNORMAL LOW (ref >60)
GFR calc non Af Amer: 25 mL/min — ABNORMAL LOW (ref >60)
Glucose, Bld: 132 mg/dL — ABNORMAL HIGH (ref 70–99)
Potassium: 3.9 mmol/L (ref 3.5–5.1)
Sodium: 139 mmol/L (ref 135–145)

## 2020-08-26 LAB — BRAIN NATRIURETIC PEPTIDE: B Natriuretic Peptide: 675.7 pg/mL — ABNORMAL HIGH (ref 0.0–100.0)

## 2020-08-26 LAB — APTT
aPTT: 91 seconds — ABNORMAL HIGH (ref 24–36)
aPTT: 68 seconds — ABNORMAL HIGH (ref 24–36)

## 2020-08-26 LAB — GLUCOSE, CAPILLARY
Glucose-Capillary: 136 mg/dL — ABNORMAL HIGH (ref 70–99)
Glucose-Capillary: 134 mg/dL — ABNORMAL HIGH (ref 70–99)
Glucose-Capillary: 83 mg/dL (ref 70–99)
Glucose-Capillary: 139 mg/dL — ABNORMAL HIGH (ref 70–99)

## 2020-08-26 MED ORDER — PROMETHAZINE HCL 25 MG/ML IJ SOLN
12.5000 mg | Freq: Four times a day (QID) | INTRAMUSCULAR | Status: DC | PRN
  Administered 2020-08-27: 12.5 mg via INTRAVENOUS
  Filled 2020-08-26: qty 1

## 2020-08-26 MED ORDER — ISOSORB DINITRATE-HYDRALAZINE 20-37.5 MG PO TABS
2.0000 | ORAL_TABLET | Freq: Three times a day (TID) | ORAL | Status: DC
  Administered 2020-08-26 – 2020-08-27 (×2): 2 via ORAL
  Filled 2020-08-26 (×2): qty 2

## 2020-08-26 NOTE — Progress Notes (Addendum)
ANTICOAGULATION CONSULT NOTE   Pharmacy Consult for Heparin Indication: ACS/afib  No Known Allergies  Patient Measurements: Height: 5\' 4"  (162.6 cm) Weight: 80.2 kg (176 lb 11.2 oz) IBW/kg (Calculated) : 54.7 Heparin Dosing Weight: 75 kg  Vital Signs: Temp: 98.1 F (36.7 C) (09/18 0802) Temp Source: Oral (09/18 0802) BP: 127/71 (09/18 0802) Pulse Rate: 80 (09/18 0802)  Labs: Recent Labs    08/24/20 1205 08/24/20 1346 08/25/20 0637 08/25/20 1813 08/26/20 0505  HGB 9.4*   < > 9.2*  --  8.4*  HCT 29.3*  --  28.7*  --  26.2*  PLT 305  --  327  --  344  APTT 171*   < > 43* 60* 91*  CREATININE 1.66*  --  1.83*  --  1.87*   < > = values in this interval not displayed.    Estimated Creatinine Clearance: 24.6 mL/min (A) (by C-G formula based on SCr of 1.87 mg/dL (H)).  Assessment: 79 yo female with new onset ACS per telephone conversation with Dr. Terri Skains of Cardiology. Patient has had a recent Neurosurgical procedure, therefore Neurosurgery is requesting no boluses and a tight APTT goal of 50-60.   F/u plan back to cath lab for intervention next week.  Today PTT is above goal at 91 sec on heparin drip 950 units/hr. Aptt's have been very labile, will make small adjustments as needed.  No issues with line or bleeding reported per RN.  Goal of Therapy:  APTT 50-60 sec per neurosurgery Monitor platelets by anticoagulation protocol: Yes   Plan:  Decrease heparin drip to 850 units/hr aPTT and cbc daily   Fara Olden, PharmD PGY-1 Pharmacy Resident 08/26/2020 8:26 AM Please see AMION for all pharmacy numbers

## 2020-08-26 NOTE — Progress Notes (Addendum)
Subjective:  She is presently doing well denies dyspnea or chest pain.  Intake/Output from previous day:  I/O last 3 completed shifts: In: 1004.6 [P.O.:440; I.V.:564.6] Out: -  No intake/output data recorded.  Blood pressure 113/64, pulse 82, temperature 98 F (36.7 C), temperature source Oral, resp. rate 18, height 5' 4"  (1.626 m), weight 80.2 kg, SpO2 100 %. Physical Exam Cardiovascular:     Rate and Rhythm: Normal rate and regular rhythm.     Pulses: Intact distal pulses.     Heart sounds: Normal heart sounds. No murmur heard.  No gallop.      Comments: No leg edema, no JVD. Pulmonary:     Effort: Pulmonary effort is normal.     Breath sounds: Normal breath sounds.  Abdominal:     General: Bowel sounds are normal.     Palpations: Abdomen is soft.    Lab Results: BMP BNP (last 3 results) Recent Labs    08/24/20 1205 08/25/20 0637 08/26/20 0505  BNP 704.9* 517.4* 675.7*    ProBNP (last 3 results) No results for input(s): PROBNP in the last 8760 hours. BMP Latest Ref Rng & Units 08/26/2020 08/25/2020 08/24/2020  Glucose 70 - 99 mg/dL 132(H) 144(H) 189(H)  BUN 8 - 23 mg/dL 38(H) 36(H) 37(H)  Creatinine 0.44 - 1.00 mg/dL 1.87(H) 1.83(H) 1.66(H)  BUN/Creat Ratio 12 - 28 - - -  Sodium 135 - 145 mmol/L 139 134(L) 132(L)  Potassium 3.5 - 5.1 mmol/L 3.9 4.1 3.5  Chloride 98 - 111 mmol/L 102 97(L) 94(L)  CO2 22 - 32 mmol/L 29 29 28   Calcium 8.9 - 10.3 mg/dL 8.7(L) 8.5(L) 8.4(L)   Hepatic Function Latest Ref Rng & Units 08/22/2020 08/18/2020 02/28/2020  Total Protein 6.5 - 8.1 g/dL 7.2 8.0 6.8  Albumin 3.5 - 5.0 g/dL 2.8(L) 3.9 3.9  AST 15 - 41 U/L 190(H) 19 17  ALT 0 - 44 U/L 66(H) 16 12  Alk Phosphatase 38 - 126 U/L 79 79 73  Total Bilirubin 0.3 - 1.2 mg/dL 0.5 0.4 0.5   CBC Latest Ref Rng & Units 08/26/2020 08/25/2020 08/24/2020  WBC 4.0 - 10.5 K/uL 11.9(H) 11.6(H) 11.2(H)  Hemoglobin 12.0 - 15.0 g/dL 8.4(L) 9.2(L) 9.4(L)  Hematocrit 36 - 46 % 26.2(L) 28.7(L) 29.3(L)    Platelets 150 - 400 K/uL 344 327 305   Lipid Panel  No results found for: CHOL, TRIG, HDL, CHOLHDL, VLDL, LDLCALC, LDLDIRECT Cardiac Panel (last 3 results) No results for input(s): CKTOTAL, CKMB, TROPONINI, RELINDX in the last 72 hours.  HEMOGLOBIN A1C No results found for: HGBA1C, MPG TSH Recent Labs    03/13/20 0949 08/22/20 1759  TSH 1.190 1.054     Scheduled Meds: . amiodarone  200 mg Oral Daily  . bumetanide (BUMEX) IV  1 mg Intravenous Q12H  . cholecalciferol  3,000 Units Oral Daily  . cloNIDine  0.1 mg Transdermal Weekly  . colchicine  0.3 mg Oral Daily  . diclofenac Sodium  2 g Topical QID  . isosorbide-hydrALAZINE  1 tablet Oral TID  . metoprolol tartrate  25 mg Oral BID  . pantoprazole  40 mg Oral BID  . polyethylene glycol  17 g Oral BID  . potassium chloride  20 mEq Oral BID  . rosuvastatin  40 mg Oral QHS  . senna-docusate  1 tablet Oral BID  . sodium chloride flush  10-40 mL Intracatheter Q12H  . sodium chloride flush  3 mL Intravenous Q12H  . sodium chloride flush  3 mL  Intravenous Q12H  . sucralfate  1 g Oral TID WC & HS   Continuous Infusions: . sodium chloride Stopped (08/24/20 1757)  . heparin 850 Units/hr (08/26/20 0840)  . methocarbamol (ROBAXIN) IV    . nitroGLYCERIN Stopped (08/25/20 0902)   PRN Meds:.sodium chloride, acetaminophen **OR** acetaminophen, alum & mag hydroxide-simeth, bisacodyl, hydrALAZINE, HYDROcodone-acetaminophen, HYDROmorphone (DILAUDID) injection, menthol-cetylpyridinium **OR** phenol, methocarbamol **OR** methocarbamol (ROBAXIN) IV, morphine injection, nitroGLYCERIN, ondansetron **OR** ondansetron (ZOFRAN) IV, oxyCODONE-acetaminophen, sodium chloride flush, sodium chloride flush  Assessment/Plan:  1.  Peri-operative non-ST elevation myocardial infarction involving proximal LAD.  Completed infarct. 2.  Acute systolic heart failure 3.  Acute renal insufficiency, contributed by decreased cardiac output.  Patient only received  17 mL of contrast during coronary angiography.  Stage IV chronic kidney disease.  Baseline stage IIIb chronic kidney disease.  Do not suspect contrast nephropathy but overall hemodynamic low output acute renal failure. 4.  Hypertension well controlled. 5.  New onset atrial fibrillation, related to ACS.  Recommendation: Patient has acute renal failure on top of chronic kidney disease.  Patient has completed anterior infarct and the myocardium may still be viable and still worthwhile to proceed with coronary angiography and angioplasty to salvage especially a very large diagonal 1 that comes after the ostial LAD 80% stenosis.  At present, would like to revascularize the LAD itself.  She has ostial large RCA stenosis I would like to revascularize as well.  As she has completed the infarct and she is remained stable and CHF is improving, I would recommend continued medical therapy for now and plan on performing angiography in 2 to 3 weeks when she is more stable and renal function is remained stable.  Otherwise her chances of developing complete renal failure needing dialysis is extremely high which will complicate her overall recovery.  I will request orthopedics to give me permission to start aspirin and Eliquis 2.5 mg p.o. twice daily, we can then turn off the heparin.  Otherwise unstable cardiac medications for presently I would like to discontinue clonidine and titrate BiDil and metoprolol.   [name], MD, [facility] 08/26/2020, 2:32 PM Office: [phone]

## 2020-08-26 NOTE — TOC Progression Note (Signed)
Transition of Care [facility]) - Progression Note    Patient Details  Name: Kristin [name] MRN: [id number] Date of Birth: October 16, 1941  Transition of Care [facility] CM/SW [place], [place] Phone Number: [phone] 08/26/2020, 4:43 PM  Clinical Narrative:     CSW met with patient and patient's daughter about bed choices. CSW provided the current 3 bed choices. They chose [place]. CSW attempted to reach out to [place] however had to leave a voice mail.  TOC team will continue to assist with discharge planning needs. o   Expected Discharge Plan: Skilled Nursing Facility Barriers to Discharge: Continued Medical Work up  Expected Discharge Plan and Services Expected Discharge Plan: [place]   Discharge Planning Services: CM Consult Post Acute Care Choice: [place] arrangements for the past 2 months: Single Family Home                           HH Arranged: PT, OT [place] Agency: [facility] [employer] Date [place]: 08/22/20 Time [place]: 1315 Representative spoke with at [place]: [place] ([place]) Interventions    Readmission Risk Interventions No flowsheet data found.

## 2020-08-26 NOTE — Progress Notes (Signed)
ANTICOAGULATION CONSULT NOTE   Pharmacy Consult for Heparin Indication: ACS/afib  No Known Allergies  Patient Measurements: Height: 5\' 4"  (162.6 cm) Weight: 80.2 kg (176 lb 11.2 oz) IBW/kg (Calculated) : 54.7 Heparin Dosing Weight: 75 kg  Vital Signs: Temp: 98 F (36.7 C) (09/18 1137) Temp Source: Oral (09/18 1137) BP: 113/64 (09/18 1137) Pulse Rate: 82 (09/18 1137)  Labs: Recent Labs    08/24/20 1205 08/24/20 1346 08/25/20 0637 08/25/20 0637 08/25/20 1813 08/26/20 0505 08/26/20 1948  HGB 9.4*   < > 9.2*  --   --  8.4*  --   HCT 29.3*  --  28.7*  --   --  26.2*  --   PLT 305  --  327  --   --  344  --   APTT 171*   < > 43*   < > 60* 91* 68*  CREATININE 1.66*  --  1.83*  --   --  1.87*  --    < > = values in this interval not displayed.    Estimated Creatinine Clearance: 24.6 mL/min (A) (by C-G formula based on SCr of 1.87 mg/dL (H)).  Assessment: 79 yo female with new onset ACS per telephone conversation with Dr. Terri Skains of Cardiology. Patient has had a recent Neurosurgical procedure, therefore Neurosurgery is requesting no boluses and a tight APTT goal of 50-60.   F/u plan back to cath lab for intervention next week.  Heparin drip 850 uts/hr aptt 68sec slightly above goal but vary labile with small changes. No issues with line or bleeding reported per RN.  Goal of Therapy:  APTT 50-60 sec per neurosurgery Monitor platelets by anticoagulation protocol: Yes   Plan:  Continue lower dose heparin drip  850 units/hr aPTT and cbc daily    Bonnita Nasuti Pharm.D. CPP, BCPS Clinical Pharmacist [phone] 08/26/2020 8:39 PM   Please see AMION for all pharmacy numbers

## 2020-08-26 NOTE — Progress Notes (Signed)
[facility]  PROGRESS NOTE  Kristin [name] XLK:[id number] DOB: [birth year] DOA: 08/18/2020 PCP: Caren Macadam, MD   Brief HPI:   79 year old female with history of chronic back pain, hypertension, diabetes mellitus type 2 came to ED with complaints of bilateral lower extremity weakness and worsening lower back pain.  Patient had an MRI on 04/13/2020 which showed advanced lumbar facet arthrosis with multilevel degenerative anterolisthesis, severe spinal stenosis at L2-3 and L3-4.  Mild to moderate spinal stenosis at L4-5.  MRI ordered during this admission showed similar changes.  Neurosurgery was consulted and surgical intervention was recommended for possible phlegmon at L3-4.  No evidence of infection was found during lumbar laminectomy.  Patient has been receiving IV antibiotics.  Patient developed acute hypoxemic respiratory failure on 08/22/2020, likely from pulmonary edema.  Cardiology was consulted.    Subjective   Patient seen and examined, denies chest pain or shortness of breath.   Assessment/Plan:     1. SIRS-patient presents with fever, tachycardia, tachypnea.  No clear source of infection.  No evidence of infection of lumbar spine or knee joint infection.  Patient was started on vancomycin and cefepime, vancomycin has been discontinued as of 08/21/2020.  Continue with IV cefepime at this time.  No evidence found during laminectomy and decompression.  Blood culture showed no growth.  Urine culture showed insignificant growth.  Synovial fluid right knee showed no growth.  CT abdomen was negative for infection.   2. L2 -L4 severe lumbar stenosis-patient presented with lower extremity pain and numbness.  Underwent bilateral L2, L3, partial L4 laminectomy for decompression of neural elements on 08/19/2020.  No evidence of infection was found.  PT evaluation recommended home health PT. patient is currently on heparin.  Monitor for hematoma/lower extremity weakness. 3. Acute hypoxemic  respiratory failure-secondary pulmonary edema.  BNP was elevated.  Cardiology was consulted.  Patient was started on IV Lasix.  Lasix has been changed to Bumex. 4. NSTEMI-troponin was elevated at 27,000, patient started on low-dose heparin with PTT goal 50-60.  No bolus.  No aspirin for now due to increased risk of bleeding.  Patient currently on heparin, IV nitroglycerin for pulmonary edema, statin.  IV nitroglycerin has been discontinued.  Patient underwent diagnostic catheterization  which showed proximal LAD occluded, LVEF 35% of the entire anterior, apical and apical inferior akinesis.  Continue heparin GTT. neurosurgery recommends stent/dual antiplatelet therapy until Monday or Tuesday which will be postop 9-day or 10 of her intervention. 5. Atrial fibrillation, new onset-patient currently on IV heparin.  Continue amiodarone GTT. CHA2DS2VASc score is 5, continue anticoagulation with IV heparin. 6. Bilateral knee pain-pseudogout.  Patient underwent arthrocentesis of right knee.  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  Orthopedics following.  Patient underwent injection of Depo-Medrol into the right knee.  Continue Voltaren gel. 7. Hypertension-continue Coreg, clonidine patch, as needed hydralazine. 8. Urine retention-Foley catheter in place. 9. Acute kidney injury-creatinine is elevated at 1.87, secondary to diuresis with Lasix. Continue to monitor BMP in a.m. 10. Hypokalemia-potassium is 3.5 today.     COVID-19 Labs  No results for input(s): DDIMER, FERRITIN, LDH, CRP in the last 72 hours.  Lab Results  Component Value Date   [place] NEGATIVE 08/18/2020     Scheduled medications:   . amiodarone  200 mg Oral Daily  . bumetanide (BUMEX) IV  1 mg Intravenous Q12H  . cholecalciferol  3,000 Units Oral Daily  . cloNIDine  0.1 mg Transdermal Weekly  . colchicine  0.3 mg Oral Daily  .  diclofenac Sodium  2 g Topical QID  . isosorbide-hydrALAZINE  1 tablet Oral TID   . metoprolol tartrate  25 mg Oral BID  . pantoprazole  40 mg Oral BID  . polyethylene glycol  17 g Oral BID  . potassium chloride  20 mEq Oral BID  . rosuvastatin  40 mg Oral QHS  . senna-docusate  1 tablet Oral BID  . sodium chloride flush  10-40 mL Intracatheter Q12H  . sodium chloride flush  3 mL Intravenous Q12H  . sodium chloride flush  3 mL Intravenous Q12H  . sucralfate  1 g Oral TID WC & HS         CBG: Recent Labs  Lab 08/25/20 0757 08/25/20 1624 08/25/20 2144 08/26/20 0801 08/26/20 1140  GLUCAP 143* 131* 190* 136* 134*    SpO2: 100 % O2 Flow Rate (L/min): 8 L/min FiO2 (%): 80 %    CBC: Recent Labs  Lab 08/21/20 0237 08/21/20 0237 08/22/20 1145 08/23/20 1303 08/23/20 1309 08/23/20 1312 08/24/20 1205 08/25/20 0637 08/26/20 0505  WBC 14.5*  --  18.7*  --   --   --  11.2* 11.6* 11.9*  HGB 9.0*   < > 9.5*   < > 9.2* 9.2* 9.4* 9.2* 8.4*  HCT 28.3*   < > 30.2*   < > 27.0* 27.0* 29.3* 28.7* 26.2*  MCV 90.7  --  90.1  --   --   --  89.1 90.8 90.3  PLT 303  --  372  --   --   --  305 327 344   < > = values in this interval not displayed.    Basic Metabolic Panel: Recent Labs  Lab 08/22/20 1759 08/22/20 1759 08/23/20 0056 08/23/20 1303 08/23/20 1309 08/23/20 1312 08/24/20 1205 08/25/20 0637 08/26/20 0505  NA 130*   < > 130*   < > 134* 135 132* 134* 139  K 3.5   < > 3.6   < > 3.0* 2.9* 3.5 4.1 3.9  CL 94*  --  95*  --   --   --  94* 97* 102  CO2 21*  --  22  --   --   --  28 29 29   GLUCOSE 178*  --  190*  --   --   --  189* 144* 132*  BUN 34*  --  35*  --   --   --  37* 36* 38*  CREATININE 1.13*  --  1.18*  --   --   --  1.66* 1.83* 1.87*  CALCIUM 9.0  --  8.6*  --   --   --  8.4* 8.5* 8.7*  MG 2.0  --   --   --   --   --   --   --   --    < > = values in this interval not displayed.     Liver Function Tests: Recent Labs  Lab 08/22/20 1759  AST 190*  ALT 66*  ALKPHOS 79  BILITOT 0.5  PROT 7.2  ALBUMIN 2.8*      Antibiotics: Anti-infectives (From admission, onward)   Start     Dose/Rate Route Frequency Ordered Stop   08/20/20 0400  vancomycin (VANCOREADY) IVPB 750 mg/150 mL  Status:  Discontinued        750 mg 150 mL/hr over 60 Minutes Intravenous Every 12 hours 08/19/20 1005 08/21/20 1101   08/19/20 1030  ceFEPIme (MAXIPIME) 2 g in sodium chloride 0.9 %  100 mL IVPB        2 g 200 mL/hr over 30 Minutes Intravenous Every 12 hours 08/19/20 1005 08/24/20 1900   08/19/20 1030  vancomycin (VANCOREADY) IVPB 1750 mg/350 mL        1,750 mg 175 mL/hr over 120 Minutes Intravenous  Once 08/19/20 1005 08/19/20 1907   08/19/20 1030  ceFAZolin (ANCEF) IVPB 2g/100 mL premix        2 g 200 mL/hr over 30 Minutes Intravenous  Once 08/19/20 1019 08/19/20 1120   08/19/20 1021  ceFAZolin (ANCEF) 2-4 GM/100ML-% IVPB       Note to Pharmacy: Henrine Screws   : cabinet override      08/19/20 1021 08/19/20 1124   08/18/20 0515  ceFEPIme (MAXIPIME) 2 g in sodium chloride 0.9 % 100 mL IVPB        2 g 200 mL/hr over 30 Minutes Intravenous  Once 08/18/20 0504 08/18/20 0612   08/18/20 0515  metroNIDAZOLE (FLAGYL) IVPB 500 mg        500 mg 100 mL/hr over 60 Minutes Intravenous  Once 08/18/20 0504 08/18/20 0705   08/18/20 0515  vancomycin (VANCOCIN) IVPB 1000 mg/200 mL premix  Status:  Discontinued        1,000 mg 200 mL/hr over 60 Minutes Intravenous  Once 08/18/20 0504 08/18/20 0507   08/18/20 0515  vancomycin (VANCOREADY) IVPB 1750 mg/350 mL        1,750 mg 175 mL/hr over 120 Minutes Intravenous  Once 08/18/20 0507 08/18/20 1958       DVT prophylaxis: SCDs  Code Status: Full code  Family Communication: No family at bedside    Status is: Inpatient  Dispo: The patient is from: Home              Anticipated d/c is to: Skilled nursing facility              Anticipated d/c date is: 08/31/2020              Patient currently not medically stable for discharge  Barrier to discharge-ongoing treatment for  acute hypoxemic respiratory failure, pulmonary edema     Consultants:  Neurosurgery  Orthopedics   Objective   Vitals:   08/26/20 0004 08/26/20 0607 08/26/20 0802 08/26/20 1137  BP: (!) 107/53 108/65 127/71 113/64  Pulse: 92 83 80 82  Resp: 16 17 16 18   Temp: 98.3 F (36.8 C) 97.8 F (36.6 C) 98.1 F (36.7 C) 98 F (36.7 C)  TempSrc: Oral Axillary Oral Oral  SpO2: 100% 100% 100% 100%  Weight:  80.2 kg    Height:        Intake/Output Summary (Last 24 hours) at 08/26/2020 1316 Last data filed at 08/25/2020 2359 Gross per 24 hour  Intake 412.45 ml  Output --  Net 412.45 ml    09/16 1901 - 09/18 0700 In: 1004.6 [P.O.:440; I.V.:564.6] Out: -   Filed Weights   08/24/20 0500 08/25/20 0322 08/26/20 0607  Weight: 91.9 kg 89.1 kg 80.2 kg    Physical Examination:  General-appears in no acute distress Heart-S1-S2, regular, no murmur auscultated Lungs-bibasilar crackles Abdomen-soft, nontender, no organomegaly Extremities-no edema in the lower extremities Neuro-alert, oriented x3, no focal deficit noted   Data Reviewed:   Recent Results (from the past 240 hour(s))  Culture, blood (Routine X 2) w Reflex to ID Panel     Status: None   Collection Time: 08/18/20 12:19 AM   Specimen: BLOOD RIGHT FOREARM  Result  Value Ref Range Status   Specimen Description   Final    BLOOD RIGHT FOREARM Performed at [facility], [place] [address], [place], [place] [address]    Special Requests   Final    BOTTLES DRAWN AEROBIC AND ANAEROBIC Blood Culture adequate volume Performed at [place] [address] [place], [place] [address]    Culture   Final    NO GROWTH 5 DAYS Performed at [facility], [place] [address], [place], [place] [address]    Report Status 08/23/2020 FINAL  Final  SARS Coronavirus 2 by RT PCR (hospital order, performed in [facility] hospital lab) Nasopharyngeal Nasopharyngeal Swab     Status: None   Collection Time: 08/18/20  12:19 AM   Specimen: Nasopharyngeal Swab  Result Value Ref Range Status   SARS Coronavirus 2 NEGATIVE NEGATIVE Final    Comment: (NOTE) SARS-CoV-2 target nucleic acids are NOT DETECTED.  The SARS-CoV-2 RNA is generally detectable in upper and lower respiratory specimens during the acute phase of infection. The lowest concentration of SARS-CoV-2 viral copies this assay can detect is 250 copies / mL. A negative result does not preclude SARS-CoV-2 infection and should not be used as the sole basis for treatment or other patient management decisions.  A negative result may occur with improper specimen collection / handling, submission of specimen other than nasopharyngeal swab, presence of viral mutation(s) within the areas targeted by this assay, and inadequate number of viral copies (<250 copies / mL). A negative result must be combined with clinical observations, patient history, and epidemiological information.  Fact Sheet for Patients:   StrictlyIdeas.no  Fact Sheet for Healthcare Providers: BankingDealers.co.za  This test is not yet approved or  cleared by the [place] FDA and has been authorized for detection and/or diagnosis of SARS-CoV-2 by FDA under an Emergency Use Authorization (EUA).  This EUA will remain in effect (meaning this test can be used) for the duration of the COVID-19 declaration under Section 564(b)(1) of the Act, 21 U.S.C. section 360bbb-3(b)(1), unless the authorization is terminated or revoked sooner.  Performed at [facility], [place] [address], [place], [place] [address]   Culture, blood (Routine X 2) w Reflex to ID Panel     Status: None   Collection Time: 08/18/20  1:56 AM   Specimen: BLOOD LEFT FOREARM  Result Value Ref Range Status   Specimen Description   Final    BLOOD LEFT FOREARM Performed at [facility], [place] [address] [place], [place] [address]    Special  Requests   Final    BOTTLES DRAWN AEROBIC AND ANAEROBIC Blood Culture results may not be optimal due to an inadequate volume of blood received in culture bottles Performed at [place] [address] [place], [place] [address]    Culture   Final    NO GROWTH 5 DAYS Performed at [facility], [place] [address] [place], [place] [address]    Report Status 08/23/2020 FINAL  Final  Urine Culture     Status: Abnormal   Collection Time: 08/18/20  4:07 AM   Specimen: Urine, Catheterized  Result Value Ref Range Status   Specimen Description   Final    URINE, CATHETERIZED Performed at [place] [address] [place], [place] [address]    Special Requests   Final    NONE Performed at [facility], [place] [address] [place], [place]  [address]    Culture (A)  Final    <10,000 COLONIES/mL INSIGNIFICANT GROWTH Performed at [place] [address] [place], [place] [address]    Report Status 08/19/2020 FINAL  Final  Surgical pcr screen     Status: None   Collection Time: 08/19/20  9:45 AM   Specimen: Nasal Mucosa; Nasal Swab  Result Value Ref Range Status   MRSA, PCR NEGATIVE NEGATIVE Final   Staphylococcus aureus NEGATIVE NEGATIVE Final    Comment: (NOTE) The Xpert SA Assay (FDA approved for NASAL specimens in patients 1 years of age and older), is one component of a comprehensive surveillance program. It is not intended to diagnose infection nor to guide or monitor treatment. Performed at [facility], [place] [address], [place], [place] [address]   Body fluid culture     Status: None   Collection Time: 08/19/20  2:50 PM   Specimen: Body Fluid  Result Value Ref Range Status   Specimen Description FLUID  Final   Special Requests SYNOVIAL RIGHT KNEE  Final   Gram Stain   Final    MODERATE WBC PRESENT, PREDOMINANTLY PMN NO ORGANISMS SEEN    Culture   Final    NO GROWTH 3 DAYS Performed at [facility], [address], [place],  [address]    Report Status 08/23/2020 FINAL  Final    No results for input(s): LIPASE, AMYLASE in the last 168 hours. No results for input(s): AMMONIA in the last 168 hours.  Cardiac Enzymes: No results for input(s): CKTOTAL, CKMB, CKMBINDEX, TROPONINI in the last 168 hours. BNP (last 3 results) Recent Labs    08/24/20 1205 08/25/20 0637 08/26/20 0505  BNP 704.9* 517.4* 675.7*     Oswald Hillock   [facility] If 7PM-7AM, please contact night-coverage at [email or web address], Office  [phone]   08/26/2020, 1:16 PM  LOS: 8 days

## 2020-08-27 ENCOUNTER — Inpatient Hospital Stay (HOSPITAL_COMMUNITY): Payer: Medicare HMO

## 2020-08-27 ENCOUNTER — Telehealth: Payer: Self-pay | Admitting: Cardiology

## 2020-08-27 DIAGNOSIS — I480 Paroxysmal atrial fibrillation: Secondary | ICD-10-CM

## 2020-08-27 DIAGNOSIS — N179 Acute kidney failure, unspecified: Secondary | ICD-10-CM

## 2020-08-27 DIAGNOSIS — R778 Other specified abnormalities of plasma proteins: Secondary | ICD-10-CM

## 2020-08-27 DIAGNOSIS — I255 Ischemic cardiomyopathy: Secondary | ICD-10-CM

## 2020-08-27 DIAGNOSIS — I639 Cerebral infarction, unspecified: Secondary | ICD-10-CM

## 2020-08-27 DIAGNOSIS — E782 Mixed hyperlipidemia: Secondary | ICD-10-CM

## 2020-08-27 DIAGNOSIS — I2510 Atherosclerotic heart disease of native coronary artery without angina pectoris: Secondary | ICD-10-CM

## 2020-08-27 DIAGNOSIS — R42 Dizziness and giddiness: Secondary | ICD-10-CM

## 2020-08-27 LAB — CBC
HCT: 27.8 % — ABNORMAL LOW (ref 36.0–46.0)
Hemoglobin: 8.9 g/dL — ABNORMAL LOW (ref 12.0–15.0)
MCH: 30.1 pg (ref 26.0–34.0)
MCHC: 32 g/dL (ref 30.0–36.0)
MCV: 93.9 fL (ref 80.0–100.0)
Platelets: 328 10*3/uL (ref 150–400)
RBC: 2.96 MIL/uL — ABNORMAL LOW (ref 3.87–5.11)
RDW: 14 % (ref 11.5–15.5)
WBC: 12.3 10*3/uL — ABNORMAL HIGH (ref 4.0–10.5)
nRBC: 0.2 % (ref 0.0–0.2)

## 2020-08-27 LAB — BASIC METABOLIC PANEL
Anion gap: 8 (ref 5–15)
BUN: 34 mg/dL — ABNORMAL HIGH (ref 8–23)
CO2: 28 mmol/L (ref 22–32)
Calcium: 9.2 mg/dL (ref 8.9–10.3)
Chloride: 101 mmol/L (ref 98–111)
Creatinine, Ser: 1.68 mg/dL — ABNORMAL HIGH (ref 0.44–1.00)
GFR calc Af Amer: 33 mL/min — ABNORMAL LOW (ref >60)
GFR calc non Af Amer: 28 mL/min — ABNORMAL LOW (ref >60)
Glucose, Bld: 133 mg/dL — ABNORMAL HIGH (ref 70–99)
Potassium: 6.2 mmol/L — ABNORMAL HIGH (ref 3.5–5.1)
Sodium: 137 mmol/L (ref 135–145)

## 2020-08-27 LAB — HEPATIC FUNCTION PANEL
ALT: 45 U/L — ABNORMAL HIGH (ref 0–44)
AST: 50 U/L — ABNORMAL HIGH (ref 15–41)
Albumin: 2.5 g/dL — ABNORMAL LOW (ref 3.5–5.0)
Alkaline Phosphatase: 53 U/L (ref 38–126)
Bilirubin, Direct: 0.2 mg/dL (ref 0.0–0.2)
Indirect Bilirubin: 0.7 mg/dL (ref 0.3–0.9)
Total Bilirubin: 0.9 mg/dL (ref 0.3–1.2)
Total Protein: 6.5 g/dL (ref 6.5–8.1)

## 2020-08-27 LAB — GLUCOSE, CAPILLARY
Glucose-Capillary: 125 mg/dL — ABNORMAL HIGH (ref 70–99)
Glucose-Capillary: 97 mg/dL (ref 70–99)
Glucose-Capillary: 124 mg/dL — ABNORMAL HIGH (ref 70–99)

## 2020-08-27 LAB — HEMOGLOBIN A1C
Hgb A1c MFr Bld: 6.7 % — ABNORMAL HIGH (ref 4.8–5.6)
Mean Plasma Glucose: 145.59 mg/dL

## 2020-08-27 LAB — BRAIN NATRIURETIC PEPTIDE: B Natriuretic Peptide: 424 pg/mL — ABNORMAL HIGH (ref 0.0–100.0)

## 2020-08-27 LAB — APTT
aPTT: 21 seconds — ABNORMAL LOW (ref 24–36)
aPTT: 57 seconds — ABNORMAL HIGH (ref 24–36)

## 2020-08-27 LAB — LIPASE, BLOOD: Lipase: 27 U/L (ref 11–51)

## 2020-08-27 MED ORDER — SODIUM ZIRCONIUM CYCLOSILICATE 10 G PO PACK
10.0000 g | PACK | Freq: Once | ORAL | Status: DC
  Filled 2020-08-27: qty 1

## 2020-08-27 MED ORDER — ISOSORB DINITRATE-HYDRALAZINE 20-37.5 MG PO TABS
2.0000 | ORAL_TABLET | Freq: Three times a day (TID) | ORAL | Status: DC
  Administered 2020-08-27 – 2020-08-30 (×9): 2 via ORAL
  Filled 2020-08-27 (×9): qty 2

## 2020-08-27 MED ORDER — METOPROLOL TARTRATE 25 MG PO TABS
25.0000 mg | ORAL_TABLET | Freq: Two times a day (BID) | ORAL | Status: DC
  Administered 2020-08-27 – 2020-08-29 (×4): 25 mg via ORAL
  Filled 2020-08-27 (×4): qty 1

## 2020-08-27 MED ORDER — ASPIRIN 81 MG PO CHEW
81.0000 mg | CHEWABLE_TABLET | Freq: Every day | ORAL | Status: DC
  Administered 2020-08-27 – 2020-08-30 (×4): 81 mg via ORAL
  Filled 2020-08-27 (×4): qty 1

## 2020-08-27 MED ORDER — ASPIRIN 81 MG PO CHEW: 81.0000 mg | CHEWABLE_TABLET | Freq: Every day | ORAL | Status: DC

## 2020-08-27 MED ORDER — VITAMIN D 25 MCG (1000 UNIT) PO TABS
3000.0000 [IU] | ORAL_TABLET | Freq: Every day | ORAL | Status: DC
  Administered 2020-08-27 – 2020-08-30 (×4): 3000 [IU] via ORAL
  Filled 2020-08-27 (×4): qty 3

## 2020-08-27 MED ORDER — AMIODARONE HCL 200 MG PO TABS
200.0000 mg | ORAL_TABLET | Freq: Every day | ORAL | Status: DC
  Administered 2020-08-27 – 2020-08-30 (×4): 200 mg via ORAL
  Filled 2020-08-27 (×4): qty 1

## 2020-08-27 MED ORDER — CYCLOBENZAPRINE HCL 10 MG PO TABS
5.0000 mg | ORAL_TABLET | Freq: Once | ORAL | Status: AC
  Administered 2020-08-27: 5 mg via ORAL
  Filled 2020-08-27: qty 1

## 2020-08-27 MED ORDER — COLCHICINE 0.3 MG HALF TABLET
0.3000 mg | ORAL_TABLET | Freq: Every day | ORAL | Status: DC
  Administered 2020-08-27 – 2020-08-30 (×4): 0.3 mg via ORAL
  Filled 2020-08-27 (×4): qty 1

## 2020-08-27 MED ORDER — CYCLOBENZAPRINE HCL 10 MG PO TABS: 5.0000 mg | ORAL_TABLET | Freq: Once | ORAL | Status: DC

## 2020-08-27 MED ORDER — PANTOPRAZOLE SODIUM 40 MG PO TBEC
40.0000 mg | DELAYED_RELEASE_TABLET | Freq: Two times a day (BID) | ORAL | Status: DC
  Administered 2020-08-27 – 2020-08-30 (×6): 40 mg via ORAL
  Filled 2020-08-27 (×6): qty 1

## 2020-08-27 MED ORDER — POLYETHYLENE GLYCOL 3350 17 G PO PACK
17.0000 g | PACK | Freq: Two times a day (BID) | ORAL | Status: DC
  Administered 2020-08-27 – 2020-08-30 (×6): 17 g via ORAL
  Filled 2020-08-27 (×6): qty 1

## 2020-08-27 MED ORDER — SODIUM ZIRCONIUM CYCLOSILICATE 10 G PO PACK
10.0000 g | PACK | Freq: Once | ORAL | Status: AC
  Administered 2020-08-27: 10 g via ORAL
  Filled 2020-08-27: qty 1

## 2020-08-27 MED ORDER — SENNOSIDES-DOCUSATE SODIUM 8.6-50 MG PO TABS
1.0000 | ORAL_TABLET | Freq: Two times a day (BID) | ORAL | Status: DC
  Administered 2020-08-27 – 2020-08-30 (×6): 1 via ORAL
  Filled 2020-08-27 (×6): qty 1

## 2020-08-27 NOTE — Significant Event (Signed)
Rapid Response Event Note   Reason for Call :  Called d/t progressive weakness t/o night and questionable L facial droop.  Per RN, pt normally able to get up and walk to bathroom with little assistance. Tonight, pt has been c/o dizziness, vomiting and having diarrhea. She hasn't been able to get out of bed as well as she has been prior.   Initial Focused Assessment:  Pt laying in bed with eyes closed. She will awaken easily to verbal stimulation-she is alert and oriented.  Pt denies chest pain/SOB. Pupils 2, equal and reactive. Lungs diminished t/o BUE cool to touch, BLE warm to touch. T-97.8, HR-87, BP-144/78, RR-18, SpO2-97% on [place] 8L, NIH-0.   Interventions:  CBC/BMP already ordered for this AM.   Plan of Care:  Pt has no focal deficits. Unclear why pt is profoundly weak compared to prior shift. Also unclear as to why pt is having vomiting/diarrhea. Await AM lab results.  Continue to treat nausea with prn meds and inform day shift MD of happenings. Please call RRT if further assistance needed.    Event Summary:   MD Notified: RN to notify day shift team of happenings Call [place] End Time:0630  Dillard [place], RN

## 2020-08-27 NOTE — Progress Notes (Addendum)
Called by RN that patient having dizziness and blurred vision.  Came to see patient, she had Flexeril this morning for neck muscle spasm.  Her neck pain is better.  She has been feeling dizzy.  Blood glucose is 124 On exam there is no focal neurological deficit. Patient is on heparin for NSTEMI Will obtain CT head without contrast  Critical care time spent 35 minutes

## 2020-08-27 NOTE — TOC Progression Note (Addendum)
Transition of Care [facility]) - Progression Note    Patient Details  Name: Jerrika Ledlow MRN: [id number] Date of Birth: 02/07/1941  Transition of Care [facility]) CM/SW [place], [place] Phone Number: [phone] 08/27/2020, 10:42 AM  Clinical Narrative:     CSW spoke with [place] from [facility] and they can accept patient. Facility will start authorization tomorrow. MD has been alerted about the need for a new COVID. CSW updated patient's daughter about discharge plans.  TOC team will continue to assist with discharge planning needs.  Expected Discharge Plan: [place] Barriers to Discharge: Continued Medical Work up  Expected Discharge Plan and Services Expected Discharge Plan: [place]   Discharge Planning Services: CM Consult Post Acute Care Choice: [place] arrangements for the past 2 months: Single Family Home                           HH Arranged: PT, OT [place] Agency: [facility] [employer] Date [place]: 08/22/20 Time [place]: 0211 Representative spoke with at [place]: [place] ([place]) Interventions    Readmission Risk Interventions No flowsheet data found.

## 2020-08-27 NOTE — TOC Progression Note (Signed)
Transition of Care [facility]) - Progression Note    Patient Details  Name: Kristin [name] MRN: [id number] Date of Birth: 01-07-1941  Transition of Care [facility]) CM/SW [place], [place] Phone Number: [phone] 08/27/2020, 9:30 AM  Clinical Narrative:     CSW called [facility] back to ascertain if they had a bed available however CSW had to leave a voicemail.  TOC team will continue to assist with discharge planning needs.  Expected Discharge Plan: [place] Barriers to Discharge: Continued Medical Work up  Expected Discharge Plan and Services Expected Discharge Plan: [place]   Discharge Planning Services: CM Consult Post Acute Care Choice: [place] arrangements for the past 2 months: Single Family Home                           HH Arranged: PT, OT [place] Agency: [facility] [employer] Date [place]: 08/22/20 Time [place]: 0630 Representative spoke with at [place]: [place] ([place]) Interventions    Readmission Risk Interventions No flowsheet data found.

## 2020-08-27 NOTE — Consult Note (Signed)
Requesting Physician: Dr. Darrick Meigs    Chief Complaint: " heavy head", unsteadiness and generalized weakness  History obtained from: Patient and Chart     HPI:                                                                                                                                       Kristin [name] is a 79 y.o. female with past medical history significant for diabetes mellitus, hypertension, lipidemia presents to the emergency department with bilateral lower extremity weakness and low back pain and underwent bilateral L2-L4 laminectomy for severe lumbar stenosis on 08/19/2020.  Her's course was complicated with sepsis/SIRS and on broad-spectrum antibiotics, acute respiratory failure secondary to pulmonary edema, NSTEMI with elevated troponin of 27,000 on heparin drip as well as new onset atrial fibrillation.  Early in the morning at 6 AM, rapid response was called as patient was profoundly weak compared to prior shift.  Patient also had new onset vomiting.  There is no focal neurological deficits per rapid response nurse.  Later in the day, CT head was performed as patient complaining of dizziness and blurred vision.  CT head shows acute left cerebellar stroke, fairly large in size.  Date last known well: Unclear, likely 9/19 tPA Given: No, on heparin drip and outside window NIHSS: 3  Baseline MRS 0    Past Medical History:  Diagnosis Date  . Diabetes mellitus without complication ([place])   . Hypercholesteremia   . Hypertension   . Renal disorder    CKD Stage IV    Past Surgical History:  Procedure Laterality Date  . LUMBAR LAMINECTOMY/DECOMPRESSION MICRODISCECTOMY N/A 08/19/2020   Procedure: LUMBAR LAMINECTOMY/DECOMPRESSION  Lumbar two-three, Lumbar three-four;  Surgeon: Dawley, Theodoro Doing, DO;  Location: [facility];  Service: Neurosurgery;  Laterality: N/A;  . RIGHT/LEFT HEART CATH AND CORONARY ANGIOGRAPHY N/A 08/23/2020   Procedure: RIGHT/LEFT HEART CATH AND CORONARY ANGIOGRAPHY;  Surgeon:  [name], MD;  Location: [facility];  Service: Cardiovascular;  Laterality: N/A;    Family History  Problem Relation Age of Onset  . Diabetes Father    Social History:  reports that she has never smoked. She has never used smokeless tobacco. She reports that she does not drink alcohol and does not use drugs.  Allergies: No Known Allergies  Medications:  I reviewed home medications   ROS:                                                                                                                                     14 systems reviewed and negative except above    Examination:                                                                                                      General: Appears well-developed  Psych: Affect appropriate to situation Eyes: No scleral injection HENT: No OP obstrucion Head: Normocephalic.  Cardiovascular: Irregularly irregular rate and rhythm Respiratory: Effort normal and breath sounds normal to anterior ascultation GI: Soft.  No distension. There is no tenderness.  Skin: WDI    Neurological Examination Mental Status: Alert, oriented, thought content appropriate.  Speech fluent without evidence of aphasia. Able to follow 3 step commands without difficulty. Cranial Nerves: II: Visual fields grossly normal,  III,IV, VI: ptosis not present, extra-ocular motions intact bilaterally, pupils equal, round, reactive to light and accommodation V,VII: smile symmetric, facial light touch sensation normal bilaterally VIII: hearing normal bilaterally IX,X: uvula rises symmetrically XI: bilateral shoulder shrug XII: midline tongue extension Motor: Right : Upper extremity   5/5    Left:     Upper extremity   5/5  Lower extremity   5/5     Lower extremity   5/5 Tone and bulk:normal tone throughout; no atrophy noted Sensory:  Pinprick and light touch intact throughout, bilaterally Plantars: Right: downgoing   Left: downgoing Cerebellar: Impaired hand to nose in the left arm heel-to-shin on the left leg Gait: normal gait and station     Lab Results: Basic Metabolic Panel: Recent Labs  Lab 08/22/20 1759 08/22/20 1759 08/23/20 0056 08/23/20 0056 08/23/20 1303 08/23/20 1312 08/24/20 1205 08/24/20 1205 08/25/20 0637 08/26/20 0505 08/27/20 0818  NA 130*   < > 130*  --    < > 135 132*  --  134* 139 137  K 3.5   < > 3.6  --    < > 2.9* 3.5  --  4.1 3.9 6.2*  CL 94*   < > 95*  --   --   --  94*  --  97* 102 101  CO2 21*   < > 22  --   --   --  28  --  29 29 28   GLUCOSE 178*   < > 190*  --   --   --  189*  --  144* 132* 133*  BUN 34*   < > 35*  --   --   --  37*  --  36* 38* 34*  CREATININE 1.13*   < > 1.18*  --   --   --  1.66*  --  1.83* 1.87* 1.68*  CALCIUM 9.0   < > 8.6*   < >  --   --  8.4*   < > 8.5* 8.7* 9.2  MG 2.0  --   --   --   --   --   --   --   --   --   --    < > = values in this interval not displayed.    CBC: Recent Labs  Lab 08/22/20 1145 08/23/20 1303 08/23/20 1312 08/24/20 1205 08/25/20 0637 08/26/20 0505 08/27/20 0818  WBC 18.7*  --   --  11.2* 11.6* 11.9* 12.3*  HGB 9.5*   < > 9.2* 9.4* 9.2* 8.4* 8.9*  HCT 30.2*   < > 27.0* 29.3* 28.7* 26.2* 27.8*  MCV 90.1  --   --  89.1 90.8 90.3 93.9  PLT 372  --   --  305 327 344 328   < > = values in this interval not displayed.    Coagulation Studies: No results for input(s): LABPROT, INR in the last 72 hours.  Imaging: CT HEAD WO CONTRAST  Result Date: 08/27/2020 CLINICAL DATA:  79 year old female with dizziness and blurred vision. EXAM: CT HEAD WITHOUT CONTRAST TECHNIQUE: Contiguous axial images were obtained from the base of the skull through the vertex without intravenous contrast. COMPARISON:  None. FINDINGS: Brain: Confluent abnormal hypodensity in the left cerebellar hemisphere, a especially the superior cerebellar artery  territory (coronal image 43 and series 3, image 13. No significant posterior fossa mass effect at this time. No superimposed No acute intracranial hemorrhage identified. No ventriculomegaly. Patchy bilateral cerebral white matter hypodensity, slightly greater in the left hemisphere. Probable dilated perivascular space right basal ganglia. No acute cortically based cerebral hemisphere infarct is identified. Vascular: Calcified atherosclerosis at the skull base. No suspicious intracranial vascular hyperdensity. Skull: Negative.  Hyperostosis, normal variant. Sinuses/Orbits: Visualized paranasal sinuses and mastoids are clear. Other: Postoperative changes to both globes. No acute orbit or scalp soft tissue finding. IMPRESSION: 1. Positive for acute to subacute Infarct of the Left Cerebellum, primarily the left SCA territory. No associated hemorrhage or mass effect at this time. 2. Supratentorial changes of chronic small vessel disease. No other acute intracranial abnormality. Electronically Signed   By: Genevie Ann M.D.   On: 08/27/2020 18:26     ASSESSMENT AND PLAN   79 y.o. female with past medical history significant for diabetes mellitus, hypertension, lipidemia s/p  L2-L4 laminectomy with sepsis/SIRS and on broad-spectrum antibiotics, acute respiratory failure secondary to pulmonary edema, NSTEMI as well as new onset atrial fibrillation on heparin drip.  CT head shows acute left cerebellar stroke, moderately large in size.  Heparin drip appears to be on low-dose with a goal APTT of 40-50, agree with this as this reduces the chance of hemorrhagic conversion.  Ideally, would hold anticoagulation for 3 to 5 days, however given patient has NSTEMI-would continue at current dose and close monitoring. Etiology of stroke is likely cardioembolic in the setting of A. fib and low EF  Acute left cerebellar stroke  Recommendations -We will plan to get MRI for tomorrow morning-we will see if there is any interval  hemorrhagic transformation -MRA of  the head and carotid Dopplers -A1c lipid profile -Every 2 neurochecks for 12 hours, then every 4 neurochecks -Hold aspirin as long as patient being anticoagulated to minimize further increased risk of hemorrhage.  -PT OT evaluation -Swallow evaluation -Avoid hypotonic fluids such as D5 NS  New onset atrial fibrillation -Will need to be anticoagulated long-term for secondary prophylaxis for stroke     Niv Darley [facility] Pager Number [phone]

## 2020-08-27 NOTE — Progress Notes (Addendum)
ANTICOAGULATION CONSULT NOTE   Pharmacy Consult for Heparin Indication: ACS/afib  No Known Allergies  Patient Measurements: Height: 5\' 4"  (162.6 cm) Weight: 87.1 kg (192 lb) IBW/kg (Calculated) : 54.7 Heparin Dosing Weight: 75 kg  Vital Signs: Temp: 97.8 F (36.6 C) (09/19 0550) Temp Source: Oral (09/19 0550) BP: 144/78 (09/19 0550) Pulse Rate: 80 (09/19 0550)  Labs: Recent Labs    08/24/20 1205 08/24/20 1346 08/25/20 0637 08/25/20 1813 08/26/20 0505 08/26/20 1948 08/27/20 0231  HGB 9.4*   < > 9.2*  --  8.4*  --   --   HCT 29.3*  --  28.7*  --  26.2*  --   --   PLT 305  --  327  --  344  --   --   APTT 171*   < > 43*   < > 91* 68* 21*  CREATININE 1.66*  --  1.83*  --  1.87*  --   --    < > = values in this interval not displayed.    Estimated Creatinine Clearance: 25.6 mL/min (A) (by C-G formula based on SCr of 1.87 mg/dL (H)).  Assessment: 79 yo female with new onset ACS per telephone conversation with Dr. Terri Skains of Cardiology. Patient has had a recent Neurosurgical procedure, therefore Neurosurgery is requesting no boluses and a tight APTT goal of 50-60.   F/u plan back to cath lab for intervention next week.  This morning's APTT is significantly below goal at 21 sec on heparin drip 850 units/hr. APTT's have been very labile (yesterday was supratherapeutic on 950 units/hr). Will make small adjustments as needed.    CBC for today not yet drawn, CBC yesterday stable.  Viewed heparin drip in room, running appropriately, patient asleep. Confirmed with overnight RN there were no known issues with line or bleeding reported.   Goal of Therapy:  APTT 50-60 sec per neurosurgery Monitor platelets by anticoagulation protocol: Yes   Plan:  Increase heparin drip to 900 units/hr aPTT in 8 hours APTT and CBC daily  Fara Olden, PharmD PGY-1 Pharmacy Resident 08/27/2020 7:06 AM Please see AMION for all pharmacy numbers

## 2020-08-27 NOTE — Progress Notes (Signed)
[facility]  PROGRESS NOTE  Lorriann Hansmann ZOX:[id number] DOB: Mar 26, 1941 DOA: 08/18/2020 PCP: Caren Macadam, MD   Brief HPI:   79 year old female with history of chronic back pain, hypertension, diabetes mellitus type 2 came to ED with complaints of bilateral lower extremity weakness and worsening lower back pain.  Patient had an MRI on 04/13/2020 which showed advanced lumbar facet arthrosis with multilevel degenerative anterolisthesis, severe spinal stenosis at L2-3 and L3-4.  Mild to moderate spinal stenosis at L4-5.  MRI ordered during this admission showed similar changes.  Neurosurgery was consulted and surgical intervention was recommended for possible phlegmon at L3-4.  No evidence of infection was found during lumbar laminectomy.  Patient has been receiving IV antibiotics.  Patient developed acute hypoxemic respiratory failure on 08/22/2020, likely from pulmonary edema.  Cardiology was consulted.    Subjective   Patient seen and examined, denies chest pain or shortness of breath.   Assessment/Plan:     1. SIRS-patient presents with fever, tachycardia, tachypnea.  No clear source of infection.  No evidence of infection of lumbar spine or knee joint infection.  Patient was started on vancomycin and cefepime, vancomycin has been discontinued as of 08/21/2020.  Continue with IV cefepime at this time.  No evidence found during laminectomy and decompression.  Blood culture showed no growth.  Urine culture showed insignificant growth.  Synovial fluid right knee showed no growth.  CT abdomen was negative for infection.   2. L2 -L4 severe lumbar stenosis-patient presented with lower extremity pain and numbness.  Underwent bilateral L2, L3, partial L4 laminectomy for decompression of neural elements on 08/19/2020.  No evidence of infection was found.  PT evaluation recommended home health PT. patient is currently on heparin.  Monitor for hematoma/lower extremity weakness. 3. Acute hypoxemic  respiratory failure-secondary pulmonary edema.  BNP was elevated.  Cardiology was consulted.  Patient was started on IV Lasix.  Lasix has been changed to Bumex. 4. NSTEMI-troponin was elevated at 27,000, patient started on low-dose heparin with PTT goal 50-60.  No bolus.  No aspirin for now due to increased risk of bleeding.  Patient currently on heparin, IV nitroglycerin for pulmonary edema, statin.  IV nitroglycerin has been discontinued.  Patient underwent diagnostic catheterization  which showed proximal LAD occluded, LVEF 35% of the entire anterior, apical and apical inferior akinesis.  Continue heparin GTT. neurosurgery recommends stent/dual antiplatelet therapy until Monday or Tuesday which will be postop 9-day or 10 of her intervention. 5. Atrial fibrillation, new onset-patient currently on IV heparin.  Continue amiodarone GTT. CHA2DS2VASc score is 5, continue anticoagulation with IV heparin. 6. Bilateral knee pain-pseudogout.  Patient underwent arthrocentesis of right knee.  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  Orthopedics following.  Patient underwent injection of Depo-Medrol into the right knee.  Continue Voltaren gel. 7. Hypertension-continue Coreg, clonidine patch, as needed hydralazine. 8. Urine retention-Foley catheter in place. 9. Acute kidney injury-creatinine is elevated at 1.87, secondary to diuresis with Lasix. Continue to monitor BMP in a.m. 10. Hypokalemia-potassium is 3.5 today.     COVID-19 Labs  No results for input(s): DDIMER, FERRITIN, LDH, CRP in the last 72 hours.  Lab Results  Component Value Date   [place] NEGATIVE 08/18/2020     Scheduled medications:   . amiodarone  200 mg Oral Daily  . aspirin  81 mg Oral Daily  . bumetanide (BUMEX) IV  1 mg Intravenous Q12H  . cholecalciferol  3,000 Units Oral Daily  . colchicine  0.3 mg Oral Daily  .  cyclobenzaprine  5 mg Oral Once  . diclofenac Sodium  2 g Topical QID  .  isosorbide-hydrALAZINE  2 tablet Oral TID  . metoprolol tartrate  25 mg Oral BID  . pantoprazole  40 mg Oral BID  . polyethylene glycol  17 g Oral BID  . rosuvastatin  40 mg Oral QHS  . senna-docusate  1 tablet Oral BID  . sodium chloride flush  10-40 mL Intracatheter Q12H  . sodium chloride flush  3 mL Intravenous Q12H  . sodium chloride flush  3 mL Intravenous Q12H  . sucralfate  1 g Oral TID WC & HS         CBG: Recent Labs  Lab 08/26/20 1140 08/26/20 1657 08/26/20 2315 08/27/20 0748 08/27/20 1108  GLUCAP 134* 83 139* 125* 97    SpO2: 100 % O2 Flow Rate (L/min): 8 L/min FiO2 (%): 80 %    CBC: Recent Labs  Lab 08/22/20 1145 08/23/20 1303 08/23/20 1312 08/24/20 1205 08/25/20 0637 08/26/20 0505 08/27/20 0818  WBC 18.7*  --   --  11.2* 11.6* 11.9* 12.3*  HGB 9.5*   < > 9.2* 9.4* 9.2* 8.4* 8.9*  HCT 30.2*   < > 27.0* 29.3* 28.7* 26.2* 27.8*  MCV 90.1  --   --  89.1 90.8 90.3 93.9  PLT 372  --   --  305 327 344 328   < > = values in this interval not displayed.    Basic Metabolic Panel: Recent Labs  Lab 08/22/20 1759 08/22/20 1759 08/23/20 0056 08/23/20 1303 08/23/20 1312 08/24/20 1205 08/25/20 0637 08/26/20 0505 08/27/20 0818  NA 130*   < > 130*   < > 135 132* 134* 139 137  K 3.5   < > 3.6   < > 2.9* 3.5 4.1 3.9 6.2*  CL 94*   < > 95*  --   --  94* 97* 102 101  CO2 21*   < > 22  --   --  28 29 29 28   GLUCOSE 178*   < > 190*  --   --  189* 144* 132* 133*  BUN 34*   < > 35*  --   --  37* 36* 38* 34*  CREATININE 1.13*   < > 1.18*  --   --  1.66* 1.83* 1.87* 1.68*  CALCIUM 9.0   < > 8.6*  --   --  8.4* 8.5* 8.7* 9.2  MG 2.0  --   --   --   --   --   --   --   --    < > = values in this interval not displayed.     Liver Function Tests: Recent Labs  Lab 08/22/20 1759 08/26/20 2304  AST 190* 50*  ALT 66* 45*  ALKPHOS 79 53  BILITOT 0.5 0.9  PROT 7.2 6.5  ALBUMIN 2.8* 2.5*     Antibiotics: Anti-infectives (From admission, onward)    Start     Dose/Rate Route Frequency Ordered Stop   08/20/20 0400  vancomycin (VANCOREADY) IVPB 750 mg/150 mL  Status:  Discontinued        750 mg 150 mL/hr over 60 Minutes Intravenous Every 12 hours 08/19/20 1005 08/21/20 1101   08/19/20 1030  ceFEPIme (MAXIPIME) 2 g in sodium chloride 0.9 % 100 mL IVPB        2 g 200 mL/hr over 30 Minutes Intravenous Every 12 hours 08/19/20 1005 08/24/20 1900   08/19/20 1030  vancomycin (  VANCOREADY) IVPB 1750 mg/350 mL        1,750 mg 175 mL/hr over 120 Minutes Intravenous  Once 08/19/20 1005 08/19/20 1907   08/19/20 1030  ceFAZolin (ANCEF) IVPB 2g/100 mL premix        2 g 200 mL/hr over 30 Minutes Intravenous  Once 08/19/20 1019 08/19/20 1120   08/19/20 1021  ceFAZolin (ANCEF) 2-4 GM/100ML-% IVPB       Note to Pharmacy: Henrine Screws   : cabinet override      08/19/20 1021 08/19/20 1124   08/18/20 0515  ceFEPIme (MAXIPIME) 2 g in sodium chloride 0.9 % 100 mL IVPB        2 g 200 mL/hr over 30 Minutes Intravenous  Once 08/18/20 0504 08/18/20 0612   08/18/20 0515  metroNIDAZOLE (FLAGYL) IVPB 500 mg        500 mg 100 mL/hr over 60 Minutes Intravenous  Once 08/18/20 0504 08/18/20 0705   08/18/20 0515  vancomycin (VANCOCIN) IVPB 1000 mg/200 mL premix  Status:  Discontinued        1,000 mg 200 mL/hr over 60 Minutes Intravenous  Once 08/18/20 0504 08/18/20 0507   08/18/20 0515  vancomycin (VANCOREADY) IVPB 1750 mg/350 mL        1,750 mg 175 mL/hr over 120 Minutes Intravenous  Once 08/18/20 0507 08/18/20 1958       DVT prophylaxis: SCDs  Code Status: Full code  Family Communication: No family at bedside    Status is: Inpatient  Dispo: The patient is from: Home              Anticipated d/c is to: Skilled nursing facility              Anticipated d/c date is: 08/31/2020              Patient currently not medically stable for discharge  Barrier to discharge-ongoing treatment for acute hypoxemic respiratory failure, pulmonary edema      Consultants:  Neurosurgery  Orthopedics   Objective   Vitals:   08/26/20 2108 08/27/20 0055 08/27/20 0550 08/27/20 0750  BP: 134/76 (!) 151/86 (!) 144/78 122/76  Pulse: 86 84 80 84  Resp: 17  18 18   Temp: 98.6 F (37 C)  97.8 F (36.6 C) 98.3 F (36.8 C)  TempSrc: Oral  Oral Oral  SpO2: 97%   100%  Weight:   87.1 kg   Height:        Intake/Output Summary (Last 24 hours) at 08/27/2020 1109 Last data filed at 08/27/2020 0700 Gross per 24 hour  Intake 511.34 ml  Output 800 ml  Net -288.66 ml    09/17 1901 - 09/19 0700 In: 923.8 [P.O.:480; I.V.:443.8] Out: 800 [Urine:800]  Filed Weights   08/25/20 0322 08/26/20 0607 08/27/20 0550  Weight: 89.1 kg 80.2 kg 87.1 kg    Physical Examination:  General-appears in no acute distress Heart-S1-S2, regular, no murmur auscultated Lungs-bibasilar crackles Abdomen-soft, nontender, no organomegaly Extremities-no edema in the lower extremities Neuro-alert, oriented x3, no focal deficit noted   Data Reviewed:   Recent Results (from the past 240 hour(s))  Culture, blood (Routine X 2) w Reflex to ID Panel     Status: None   Collection Time: 08/18/20 12:19 AM   Specimen: BLOOD RIGHT FOREARM  Result Value Ref Range Status   Specimen Description   Final    BLOOD RIGHT FOREARM Performed at [facility], [address], [place], [place] [address]  Special Requests   Final    BOTTLES DRAWN AEROBIC AND ANAEROBIC Blood Culture adequate volume Performed at [facility] [address] [place], [place] [address]    Culture   Final    NO GROWTH 5 DAYS Performed at [facility], [place] [address] [place], [place] [address]    Report Status 08/23/2020 FINAL  Final  SARS Coronavirus 2 by RT PCR (hospital order, performed in [facility] hospital lab) Nasopharyngeal Nasopharyngeal Swab     Status: None   Collection Time: 08/18/20 12:19 AM   Specimen: Nasopharyngeal Swab  Result Value Ref Range  Status   SARS Coronavirus 2 NEGATIVE NEGATIVE Final    Comment: (NOTE) SARS-CoV-2 target nucleic acids are NOT DETECTED.  The SARS-CoV-2 RNA is generally detectable in upper and lower respiratory specimens during the acute phase of infection. The lowest concentration of SARS-CoV-2 viral copies this assay can detect is 250 copies / mL. A negative result does not preclude SARS-CoV-2 infection and should not be used as the sole basis for treatment or other patient management decisions.  A negative result may occur with improper specimen collection / handling, submission of specimen other than nasopharyngeal swab, presence of viral mutation(s) within the areas targeted by this assay, and inadequate number of viral copies (<250 copies / mL). A negative result must be combined with clinical observations, patient history, and epidemiological information.  Fact Sheet for Patients:   StrictlyIdeas.no  Fact Sheet for Healthcare Providers: BankingDealers.co.za  This test is not yet approved or  cleared by the [place] FDA and has been authorized for detection and/or diagnosis of SARS-CoV-2 by FDA under an Emergency Use Authorization (EUA).  This EUA will remain in effect (meaning this test can be used) for the duration of the COVID-19 declaration under Section 564(b)(1) of the Act, 21 U.S.C. section 360bbb-3(b)(1), unless the authorization is terminated or revoked sooner.  Performed at [facility], [place] [address], [place], [place] [address]   Culture, blood (Routine X 2) w Reflex to ID Panel     Status: None   Collection Time: 08/18/20  1:56 AM   Specimen: BLOOD LEFT FOREARM  Result Value Ref Range Status   Specimen Description   Final    BLOOD LEFT FOREARM Performed at [facility], [place] [address], [place], [place] [address]    Special Requests   Final    BOTTLES DRAWN AEROBIC AND ANAEROBIC Blood Culture  results may not be optimal due to an inadequate volume of blood received in culture bottles Performed at [facility] [address] [place], [place] [address]    Culture   Final    NO GROWTH 5 DAYS Performed at [facility], [place] [address], [place], [place] [address]    Report Status 08/23/2020 FINAL  Final  Urine Culture     Status: Abnormal   Collection Time: 08/18/20  4:07 AM   Specimen: Urine, Catheterized  Result Value Ref Range Status   Specimen Description   Final    URINE, CATHETERIZED Performed at [facility] [address], [place], [place] [address]    Special Requests   Final    NONE Performed at [facility], [place] [address], [place], [place] [address]    Culture (A)  Final    <10,000 COLONIES/mL INSIGNIFICANT GROWTH Performed at [facility] [address] [place],  [address]    Report Status  08/19/2020 FINAL  Final  Surgical pcr screen     Status: None   Collection Time: 08/19/20  9:45 AM   Specimen: Nasal Mucosa; Nasal Swab  Result Value Ref Range Status   MRSA, PCR NEGATIVE NEGATIVE Final   Staphylococcus aureus NEGATIVE NEGATIVE Final    Comment: (NOTE) The Xpert SA Assay (FDA approved for NASAL specimens in patients 74 years of age and older), is one component of a comprehensive surveillance program. It is not intended to diagnose infection nor to guide or monitor treatment. Performed at [facility], [place] [address], [place], [place] [address]   Body fluid culture     Status: None   Collection Time: 08/19/20  2:50 PM   Specimen: Body Fluid  Result Value Ref Range Status   Specimen Description FLUID  Final   Special Requests SYNOVIAL RIGHT KNEE  Final   Gram Stain   Final    MODERATE WBC PRESENT, PREDOMINANTLY PMN NO ORGANISMS SEEN    Culture   Final    NO GROWTH 3 DAYS Performed at [facility], [address], [place], [place] [address]    Report  Status 08/23/2020 FINAL  Final    Recent Labs  Lab 08/26/20 2304  LIPASE 27   No results for input(s): AMMONIA in the last 168 hours.  Cardiac Enzymes: No results for input(s): CKTOTAL, CKMB, CKMBINDEX, TROPONINI in the last 168 hours. BNP (last 3 results) Recent Labs    08/25/20 0637 08/26/20 0505 08/27/20 0818  BNP 517.4* 675.7* 424.0*     [place]   [facility] If 7PM-7AM, please contact night-coverage at [email or web address], Office  [phone]   08/27/2020, 11:09 AM  LOS: 9 days

## 2020-08-27 NOTE — Progress Notes (Signed)
[facility]  PROGRESS NOTE  Kristin [name] WIO:[id number] DOB: 06-13-1941 DOA: 08/18/2020 PCP: Caren Macadam, MD   Brief HPI:   79 year old female with history of chronic back pain, hypertension, diabetes mellitus type 2 came to ED with complaints of bilateral lower extremity weakness and worsening lower back pain.  Patient had an MRI on 04/13/2020 which showed advanced lumbar facet arthrosis with multilevel degenerative anterolisthesis, severe spinal stenosis at L2-3 and L3-4.  Mild to moderate spinal stenosis at L4-5.  MRI ordered during this admission showed similar changes.  Neurosurgery was consulted and surgical intervention was recommended for possible phlegmon at L3-4.  No evidence of infection was found during lumbar laminectomy.  Patient has been receiving IV antibiotics.  Patient developed acute hypoxemic respiratory failure on 08/22/2020, likely from pulmonary edema.  Cardiology was consulted.    Subjective   Patient seen and examined, complains of neck muscle spasm.   Assessment/Plan:     1. SIRS-patient presents with fever, tachycardia, tachypnea.  No clear source of infection.  No evidence of infection of lumbar spine or knee joint infection.  Patient was started on vancomycin and cefepime, vancomycin has been discontinued as of 08/21/2020.  Continue with IV cefepime at this time.  No evidence found during laminectomy and decompression.  Blood culture showed no growth.  Urine culture showed insignificant growth.  Synovial fluid right knee showed no growth.  CT abdomen was negative for infection.   2. Neck pain/muscle spasm-patient already has Robaxin as needed ordered. 3. L2 -L4 severe lumbar stenosis-patient presented with lower extremity pain and numbness.  Underwent bilateral L2, L3, partial L4 laminectomy for decompression of neural elements on 08/19/2020.  No evidence of infection was found.  PT evaluation recommended home health PT. patient is currently on heparin.   Monitor for hematoma/lower extremity weakness. 4. Acute hypoxemic respiratory failure-secondary pulmonary edema.  BNP was elevated.  Cardiology was consulted.  Patient was started on IV Lasix.  Lasix has been changed to Bumex. 5. NSTEMI-troponin was elevated at 27,000, patient started on low-dose heparin with PTT goal 50-60.  No bolus.  No aspirin for now due to increased risk of bleeding.  Patient currently on heparin, IV nitroglycerin for pulmonary edema, statin.  IV nitroglycerin has been discontinued.  Patient underwent diagnostic catheterization  which showed proximal LAD occluded, LVEF 35% of the entire anterior, apical and apical inferior akinesis.  Continue heparin GTT. neurosurgery recommends stent/dual antiplatelet therapy until Monday or Tuesday which will be postop 9-day or 10 of her intervention.  Patient has been started on aspirin 81 mg p.o. daily.  Plan to start Eliquis in next 2 to 3 days after clearance by neurosurgery.  Cardiology plans for cardiac cath in 2 to 3 weeks.  Cardiac cath has been canceled during this admission due to worsening renal function. 6. Atrial fibrillation, new onset-patient currently on IV heparin.  Continue amiodarone GTT. CHA2DS2VASc score is 5, continue anticoagulation with IV heparin. 7. Bilateral knee pain-pseudogout.  Patient underwent arthrocentesis of right knee.  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  Orthopedics following.  Patient underwent injection of Depo-Medrol into the right knee.  Continue Voltaren gel. 8. Hypertension-continue Coreg, clonidine patch, as needed hydralazine. 9. Hyperkalemia-potassium elevated 6.2.  Will hold potassium supplementation.  1 dose of Lokelma 10 g p.o. will be given.  Follow BMP in am. 10. Urine retention-Foley catheter in place. 11. Acute kidney injury-creatinine is elevated at 1.68, improved from 1.87 yesterday, secondary to diuresis with Lasix. Continue to monitor BMP  in a.m.      COVID-19  Labs  No results for input(s): DDIMER, FERRITIN, LDH, CRP in the last 72 hours.  Lab Results  Component Value Date   [place] NEGATIVE 08/18/2020     Scheduled medications:   . amiodarone  200 mg Oral Daily  . aspirin  81 mg Oral Daily  . bumetanide (BUMEX) IV  1 mg Intravenous Q12H  . cholecalciferol  3,000 Units Oral Daily  . colchicine  0.3 mg Oral Daily  . cyclobenzaprine  5 mg Oral Once  . diclofenac Sodium  2 g Topical QID  . isosorbide-hydrALAZINE  2 tablet Oral TID  . metoprolol tartrate  25 mg Oral BID  . pantoprazole  40 mg Oral BID  . polyethylene glycol  17 g Oral BID  . rosuvastatin  40 mg Oral QHS  . senna-docusate  1 tablet Oral BID  . sodium chloride flush  10-40 mL Intracatheter Q12H  . sodium chloride flush  3 mL Intravenous Q12H  . sodium chloride flush  3 mL Intravenous Q12H  . sucralfate  1 g Oral TID WC & HS         CBG: Recent Labs  Lab 08/26/20 1140 08/26/20 1657 08/26/20 2315 08/27/20 0748 08/27/20 1108  GLUCAP 134* 83 139* 125* 97    SpO2: 99 % O2 Flow Rate (L/min): 8 L/min FiO2 (%): 80 %    CBC: Recent Labs  Lab 08/22/20 1145 08/23/20 1303 08/23/20 1312 08/24/20 1205 08/25/20 0637 08/26/20 0505 08/27/20 0818  WBC 18.7*  --   --  11.2* 11.6* 11.9* 12.3*  HGB 9.5*   < > 9.2* 9.4* 9.2* 8.4* 8.9*  HCT 30.2*   < > 27.0* 29.3* 28.7* 26.2* 27.8*  MCV 90.1  --   --  89.1 90.8 90.3 93.9  PLT 372  --   --  305 327 344 328   < > = values in this interval not displayed.    Basic Metabolic Panel: Recent Labs  Lab 08/22/20 1759 08/22/20 1759 08/23/20 0056 08/23/20 1303 08/23/20 1312 08/24/20 1205 08/25/20 0637 08/26/20 0505 08/27/20 0818  NA 130*   < > 130*   < > 135 132* 134* 139 137  K 3.5   < > 3.6   < > 2.9* 3.5 4.1 3.9 6.2*  CL 94*   < > 95*  --   --  94* 97* 102 101  CO2 21*   < > 22  --   --  28 29 29 28   GLUCOSE 178*   < > 190*  --   --  189* 144* 132* 133*  BUN 34*   < > 35*  --   --  37* 36* 38* 34*   CREATININE 1.13*   < > 1.18*  --   --  1.66* 1.83* 1.87* 1.68*  CALCIUM 9.0   < > 8.6*  --   --  8.4* 8.5* 8.7* 9.2  MG 2.0  --   --   --   --   --   --   --   --    < > = values in this interval not displayed.     Liver Function Tests: Recent Labs  Lab 08/22/20 1759 08/26/20 2304  AST 190* 50*  ALT 66* 45*  ALKPHOS 79 53  BILITOT 0.5 0.9  PROT 7.2 6.5  ALBUMIN 2.8* 2.5*     Antibiotics: Anti-infectives (From admission, onward)   Start     Dose/Rate  Route Frequency Ordered Stop   08/20/20 0400  vancomycin (VANCOREADY) IVPB 750 mg/150 mL  Status:  Discontinued        750 mg 150 mL/hr over 60 Minutes Intravenous Every 12 hours 08/19/20 1005 08/21/20 1101   08/19/20 1030  ceFEPIme (MAXIPIME) 2 g in sodium chloride 0.9 % 100 mL IVPB        2 g 200 mL/hr over 30 Minutes Intravenous Every 12 hours 08/19/20 1005 08/24/20 1900   08/19/20 1030  vancomycin (VANCOREADY) IVPB 1750 mg/350 mL        1,750 mg 175 mL/hr over 120 Minutes Intravenous  Once 08/19/20 1005 08/19/20 1907   08/19/20 1030  ceFAZolin (ANCEF) IVPB 2g/100 mL premix        2 g 200 mL/hr over 30 Minutes Intravenous  Once 08/19/20 1019 08/19/20 1120   08/19/20 1021  ceFAZolin (ANCEF) 2-4 GM/100ML-% IVPB       Note to Pharmacy: Henrine Screws   : cabinet override      08/19/20 1021 08/19/20 1124   08/18/20 0515  ceFEPIme (MAXIPIME) 2 g in sodium chloride 0.9 % 100 mL IVPB        2 g 200 mL/hr over 30 Minutes Intravenous  Once 08/18/20 0504 08/18/20 0612   08/18/20 0515  metroNIDAZOLE (FLAGYL) IVPB 500 mg        500 mg 100 mL/hr over 60 Minutes Intravenous  Once 08/18/20 0504 08/18/20 0705   08/18/20 0515  vancomycin (VANCOCIN) IVPB 1000 mg/200 mL premix  Status:  Discontinued        1,000 mg 200 mL/hr over 60 Minutes Intravenous  Once 08/18/20 0504 08/18/20 0507   08/18/20 0515  vancomycin (VANCOREADY) IVPB 1750 mg/350 mL        1,750 mg 175 mL/hr over 120 Minutes Intravenous  Once 08/18/20 0507 08/18/20 1958        DVT prophylaxis: SCDs  Code Status: Full code  Family Communication: No family at bedside    Status is: Inpatient  Dispo: The patient is from: Home              Anticipated d/c is to: Skilled nursing facility              Anticipated d/c date is: 08/31/2020              Patient currently not medically stable for discharge  Barrier to discharge-ongoing treatment for acute hypoxemic respiratory failure, pulmonary edema     Consultants:  Neurosurgery  Orthopedics   Objective   Vitals:   08/27/20 0055 08/27/20 0550 08/27/20 0750 08/27/20 1110  BP: (!) 151/86 (!) 144/78 122/76 128/76  Pulse: 84 80 84 83  Resp:  18 18   Temp:  97.8 F (36.6 C) 98.3 F (36.8 C) 98.3 F (36.8 C)  TempSrc:  Oral Oral Oral  SpO2:   100% 99%  Weight:  87.1 kg    Height:        Intake/Output Summary (Last 24 hours) at 08/27/2020 1116 Last data filed at 08/27/2020 0700 Gross per 24 hour  Intake 511.34 ml  Output 800 ml  Net -288.66 ml    09/17 1901 - 09/19 0700 In: 923.8 [P.O.:480; I.V.:443.8] Out: 800 [Urine:800]  Filed Weights   08/25/20 0322 08/26/20 0607 08/27/20 0550  Weight: 89.1 kg 80.2 kg 87.1 kg    Physical Examination:  General-appears in no acute distress Heart-S1-S2, regular, no murmur auscultated Lungs-clear to auscultation bilaterally, no wheezing or crackles auscultated Abdomen-soft,  nontender, no organomegaly Extremities-no edema in the lower extremities Neuro-alert, oriented x3, no focal deficit noted   Data Reviewed:   Recent Results (from the past 240 hour(s))  Culture, blood (Routine X 2) w Reflex to ID Panel     Status: None   Collection Time: 08/18/20 12:19 AM   Specimen: BLOOD RIGHT FOREARM  Result Value Ref Range Status   Specimen Description   Final    BLOOD RIGHT FOREARM Performed at [facility] [address] [place], [place] [address]    Special Requests   Final    BOTTLES DRAWN AEROBIC AND ANAEROBIC Blood Culture adequate  volume Performed at [facility] [address] [place], [place] [address]    Culture   Final    NO GROWTH 5 DAYS Performed at [facility], [place] [address], [place], [place] [address]    Report Status 08/23/2020 FINAL  Final  SARS Coronavirus 2 by RT PCR (hospital order, performed in [facility] hospital lab) Nasopharyngeal Nasopharyngeal Swab     Status: None   Collection Time: 08/18/20 12:19 AM   Specimen: Nasopharyngeal Swab  Result Value Ref Range Status   SARS Coronavirus 2 NEGATIVE NEGATIVE Final    Comment: (NOTE) SARS-CoV-2 target nucleic acids are NOT DETECTED.  The SARS-CoV-2 RNA is generally detectable in upper and lower respiratory specimens during the acute phase of infection. The lowest concentration of SARS-CoV-2 viral copies this assay can detect is 250 copies / mL. A negative result does not preclude SARS-CoV-2 infection and should not be used as the sole basis for treatment or other patient management decisions.  A negative result may occur with improper specimen collection / handling, submission of specimen other than nasopharyngeal swab, presence of viral mutation(s) within the areas targeted by this assay, and inadequate number of viral copies (<250 copies / mL). A negative result must be combined with clinical observations, patient history, and epidemiological information.  Fact Sheet for Patients:   StrictlyIdeas.no  Fact Sheet for Healthcare Providers: BankingDealers.co.za  This test is not yet approved or  cleared by the [place] FDA and has been authorized for detection and/or diagnosis of SARS-CoV-2 by FDA under an Emergency Use Authorization (EUA).  This EUA will remain in effect (meaning this test can be used) for the duration of the COVID-19 declaration under Section 564(b)(1) of the Act, 21 U.S.C. section 360bbb-3(b)(1), unless the authorization is terminated or revoked  sooner.  Performed at [facility], [place] [address] [place], [place] [address]   Culture, blood (Routine X 2) w Reflex to ID Panel     Status: None   Collection Time: 08/18/20  1:56 AM   Specimen: BLOOD LEFT FOREARM  Result Value Ref Range Status   Specimen Description   Final    BLOOD LEFT FOREARM Performed at [facility], [place] [address], [place], [place] [address]    Special Requests   Final    BOTTLES DRAWN AEROBIC AND ANAEROBIC Blood Culture results may not be optimal due to an inadequate volume of blood received in culture bottles Performed at [facility] [address] [place], [place] [address]    Culture   Final    NO GROWTH 5 DAYS Performed at [facility], [place] [address] [place], [place] [address]    Report Status 08/23/2020 FINAL  Final  Urine Culture     Status: Abnormal   Collection Time: 08/18/20  4:07  AM   Specimen: Urine, Catheterized  Result Value Ref Range Status   Specimen Description   Final    URINE, CATHETERIZED Performed at [facility] [address], [place], [place] [address]    Special Requests   Final    NONE Performed at [facility], [place] [address], [place], [place] [address]    Culture (A)  Final    <10,000 COLONIES/mL INSIGNIFICANT GROWTH Performed at [facility] [address], [place], [place] [address]    Report Status 08/19/2020 FINAL  Final  Surgical pcr screen     Status: None   Collection Time: 08/19/20  9:45 AM   Specimen: Nasal Mucosa; Nasal Swab  Result Value Ref Range Status   MRSA, PCR NEGATIVE NEGATIVE Final   Staphylococcus aureus NEGATIVE NEGATIVE Final    Comment: (NOTE) The Xpert SA Assay (FDA approved for NASAL specimens in patients 31 years of age and older), is one component of a comprehensive surveillance program. It is not intended to diagnose infection nor to guide or monitor treatment. Performed at [facility], [place] [address], [place], [place] [address]   Body fluid culture     Status: None   Collection Time: 08/19/20  2:50 PM   Specimen: Body Fluid  Result Value Ref Range Status   Specimen Description FLUID  Final   Special Requests SYNOVIAL RIGHT KNEE  Final   Gram Stain   Final    MODERATE WBC PRESENT, PREDOMINANTLY PMN NO ORGANISMS SEEN    Culture   Final    NO GROWTH 3 DAYS Performed at [facility], [address], [place], [place] [address]    Report Status 08/23/2020 FINAL  Final    Recent Labs  Lab 08/26/20 2304  LIPASE 27   No results for input(s): AMMONIA in the last 168 hours.  Cardiac Enzymes: No results for input(s): CKTOTAL, CKMB, CKMBINDEX, TROPONINI in the last 168 hours. BNP (last 3 results) Recent Labs    08/25/20 0637 08/26/20 0505 08/27/20 0818  BNP 517.4* 675.7* 424.0*     [place]   [facility] If 7PM-7AM, please contact night-coverage at [email or web address], Office  [phone]   08/27/2020, 11:16 AM  LOS: 9 days

## 2020-08-27 NOTE — Progress Notes (Signed)
No plans of doing cardiac catheterization during this hospitalization. Due to renal insufficiency, plan on seeing the patient back in office in 1 week following discharge and PCI in 2-3 weeks. No contraindication for PT/OT and start ASA today and Eliquis once Neurosurgery rounds on her. Tolerating BiDil 2 tab TID. Continue present dose Metoprolol.    Vitals with BMI 08/27/2020 08/27/2020 08/27/2020  Height - - -  Weight - 192 lbs -  BMI - 02.40 -  Systolic 973 532 992  Diastolic 76 78 86  Pulse 84 80 84     Scheduled Meds: . amiodarone  200 mg Oral Daily  . bumetanide (BUMEX) IV  1 mg Intravenous Q12H  . cholecalciferol  3,000 Units Oral Daily  . colchicine  0.3 mg Oral Daily  . diclofenac Sodium  2 g Topical QID  . isosorbide-hydrALAZINE  2 tablet Oral TID  . metoprolol tartrate  25 mg Oral BID  . pantoprazole  40 mg Oral BID  . polyethylene glycol  17 g Oral BID  . potassium chloride  20 mEq Oral BID  . rosuvastatin  40 mg Oral QHS  . senna-docusate  1 tablet Oral BID  . sodium chloride flush  10-40 mL Intracatheter Q12H  . sodium chloride flush  3 mL Intravenous Q12H  . sodium chloride flush  3 mL Intravenous Q12H  . sucralfate  1 g Oral TID WC & HS   Continuous Infusions: . sodium chloride Stopped (08/24/20 1757)  . heparin 900 Units/hr (08/27/20 0732)  . methocarbamol (ROBAXIN) IV     PRN Meds:.sodium chloride, acetaminophen **OR** acetaminophen, alum & mag hydroxide-simeth, bisacodyl, hydrALAZINE, HYDROcodone-acetaminophen, HYDROmorphone (DILAUDID) injection, menthol-cetylpyridinium **OR** phenol, methocarbamol **OR** methocarbamol (ROBAXIN) IV, morphine injection, nitroGLYCERIN, ondansetron **OR** ondansetron (ZOFRAN) IV, oxyCODONE-acetaminophen, promethazine, sodium chloride flush, sodium chloride flush    [name], MD, [facility] 08/27/2020, 8:17 AM Office: [phone]

## 2020-08-27 NOTE — Progress Notes (Signed)
Pt in no distress at this time needing v60 bipap.  Machine removed and pt doing well on 4LPM [place].  RT will continue to monitor.

## 2020-08-27 NOTE — Progress Notes (Signed)
ANTICOAGULATION CONSULT NOTE   Pharmacy Consult for Heparin Indication: ACS/afib  No Known Allergies  Patient Measurements: Height: 5\' 4"  (162.6 cm) Weight: 87.1 kg (192 lb) IBW/kg (Calculated) : 54.7 Heparin Dosing Weight: 75 kg  Vital Signs: Temp: 98.3 F (36.8 C) (09/19 1110) Temp Source: Oral (09/19 1110) BP: 124/69 (09/19 1515) Pulse Rate: 87 (09/19 1515)  Labs: Recent Labs    08/25/20 1937 08/25/20 1813 08/26/20 0505 08/26/20 0505 08/26/20 1948 08/27/20 0231 08/27/20 0818 08/27/20 1523  HGB 9.2*  --  8.4*  --   --   --  8.9*  --   HCT 28.7*  --  26.2*  --   --   --  27.8*  --   PLT 327  --  344  --   --   --  328  --   APTT 43*   < > 91*   < > 68* 21*  --  57*  CREATININE 1.83*  --  1.87*  --   --   --  1.68*  --    < > = values in this interval not displayed.    Estimated Creatinine Clearance: 28.5 mL/min (A) (by C-G formula based on SCr of 1.68 mg/dL (H)).  Assessment: 79 yo female with new onset ACS per telephone conversation with Dr. Terri Skains of Cardiology. Patient has had a recent Neurosurgical procedure, therefore Neurosurgery is requesting no boluses and a tight APTT goal of 50-60.   F/u plan back to cath lab for intervention next week.  APTT this afternoon is therapeutic at 57 on 900 units/hr. APTT's have been very labile. Will make small adjustments as needed.    CBC for today not yet drawn, CBC yesterday stable.    Goal of Therapy:  APTT 50-60 sec per neurosurgery Monitor platelets by anticoagulation protocol: Yes   Plan:  Continue IV heparin at 900 units/hr  aPTT in 8 hours APTT and CBC daily  Albertina Parr, PharmD., BCPS, BCCCP Clinical Pharmacist Clinical phone for 08/27/20 until 10pm: [phone] If after 10pm, please refer to [facility] for unit-specific pharmacist

## 2020-08-27 NOTE — Progress Notes (Signed)
Patient complaining to RN of blurry vision and intermittent dizziness that has been ongoing since yesterday. Patient is A&Ox4 and moves all extremities equally. Set of vital signs and CBG obtained. Dr. Darrick Meigs notified via phone and came to bedside to see patient.

## 2020-08-27 NOTE — Progress Notes (Signed)
Called by RN for abnormal CT head results. Reviewed CT head , it shows acute to subacute stroke in left cerebellum. Called and discussed with neurologist on call Dr Leonel Ramsay, he will see patient in consult.

## 2020-08-28 ENCOUNTER — Inpatient Hospital Stay (HOSPITAL_COMMUNITY): Payer: Medicare HMO

## 2020-08-28 DIAGNOSIS — I6340 Cerebral infarction due to embolism of unspecified cerebral artery: Secondary | ICD-10-CM

## 2020-08-28 LAB — CBC
HCT: 27.9 % — ABNORMAL LOW (ref 36.0–46.0)
Hemoglobin: 8.7 g/dL — ABNORMAL LOW (ref 12.0–15.0)
MCH: 29.3 pg (ref 26.0–34.0)
MCHC: 31.2 g/dL (ref 30.0–36.0)
MCV: 93.9 fL (ref 80.0–100.0)
Platelets: 387 10*3/uL (ref 150–400)
RBC: 2.97 MIL/uL — ABNORMAL LOW (ref 3.87–5.11)
RDW: 14.1 % (ref 11.5–15.5)
WBC: 10.7 10*3/uL — ABNORMAL HIGH (ref 4.0–10.5)
nRBC: 0 % (ref 0.0–0.2)

## 2020-08-28 LAB — APTT
aPTT: 71 seconds — ABNORMAL HIGH (ref 24–36)
aPTT: 52 seconds — ABNORMAL HIGH (ref 24–36)

## 2020-08-28 LAB — BASIC METABOLIC PANEL
Anion gap: 11 (ref 5–15)
BUN: 29 mg/dL — ABNORMAL HIGH (ref 8–23)
CO2: 27 mmol/L (ref 22–32)
Calcium: 9.3 mg/dL (ref 8.9–10.3)
Chloride: 98 mmol/L (ref 98–111)
Creatinine, Ser: 1.79 mg/dL — ABNORMAL HIGH (ref 0.44–1.00)
GFR calc Af Amer: 30 mL/min — ABNORMAL LOW (ref >60)
GFR calc non Af Amer: 26 mL/min — ABNORMAL LOW (ref >60)
Glucose, Bld: 137 mg/dL — ABNORMAL HIGH (ref 70–99)
Potassium: 4.3 mmol/L (ref 3.5–5.1)
Sodium: 136 mmol/L (ref 135–145)

## 2020-08-28 LAB — GLUCOSE, CAPILLARY
Glucose-Capillary: 124 mg/dL — ABNORMAL HIGH (ref 70–99)
Glucose-Capillary: 153 mg/dL — ABNORMAL HIGH (ref 70–99)
Glucose-Capillary: 107 mg/dL — ABNORMAL HIGH (ref 70–99)
Glucose-Capillary: 132 mg/dL — ABNORMAL HIGH (ref 70–99)

## 2020-08-28 NOTE — Progress Notes (Signed)
Inpatient Rehab Admissions Coordinator Note:   Per therapy recommendations, pt was screened for CIR candidacy by Meighan Treto, MS CCC-SLP. At this time, Pt. Appears to have functional decline and is a good candidate for CIR. Will place order for rehab consult per protocol.  Please contact me with questions.   Melecio Cueto, MS, CCC-SLP Rehab Admissions Coordinator  [phone] (celll) [phone] (office)  

## 2020-08-28 NOTE — Progress Notes (Signed)
[place] for Heparin Indication: ACS/afib  Assessment: 79 yo female with new onset ACS per telephone conversation with Dr. Terri Skains of Cardiology. Patient has had a recent Neurosurgical procedure, therefore Neurosurgery is requesting no boluses and a tight APTT goal of 50-60.   F/u plan back to cath lab for intervention next week.  APTT this afternoon is therapeutic at 57 on 900 units/hr. APTT's have been very labile. Will make small adjustments as needed.    APTT 71 sec.  Hg 9.8    Goal of Therapy:  APTT 50-60 sec per neurosurgery Monitor platelets by anticoagulation protocol: Yes   Plan:  Decrease IV heparin to 850 units/hr  APTT and CBC daily  Thanks for allowing pharmacy to be a part of this patient's care.  Excell Seltzer, PharmD Clinical Pharmacist

## 2020-08-28 NOTE — Progress Notes (Signed)
   Providing Compassionate, Quality Care - Together  NEUROSURGERY PROGRESS NOTE   S: no new complaints this am, noted to have cva over weekend  O: EXAM:  BP 123/76 (BP Location: Right Arm)   Pulse 98   Temp 98.2 F (36.8 C) (Oral)   Resp 16   Ht 5\' 4"  (1.626 m)   Wt 86.8 kg   SpO2 90%   BMI 32.85 kg/m   Awake, alert, oriented  Speech fluent, appropriate  CNs grossly intact  5/5 BUE/BLE   ASSESSMENT:  79 y.o.femalewith  1.L2-L4 severe lumbar stenosis 2.Lower extremity pain and weakness  S/p L2-4 lami on 08/19/2020  PLAN: -pt/ot - pain control -heparin drip for now, goal ptt 50-60 - ok from nsx standpoint (postop) for aspirin/anticoag for stroke prevention once cleared by neurology   Thank you for allowing me to participate in this patient's care.  Please do not hesitate to call with questions or concerns.   Elwin Sleight, [facility] Cell: [phone]

## 2020-08-28 NOTE — Consult Note (Signed)
Physical Medicine and Rehabilitation Consult   Reason for Consult: Stroke with functional deficits.  Referring Physician: Dr. Darrick Meigs   HPI: Kristin [name] is a 79 y.o. female with history of HTN, T2DM, CKD IV, chronic LBP due to lumbar stenosis with plans for L2-L4 laminectomy who was admitted on 08/19/20 with severe BLE weakness with numbness and urinary retention. MRI spine showed advanced facet arthritis L2/3 with severe stenosis L2-L4 with posterior periarticular inflammation L2-L4. Blood cultures done and patient started on IV antibiotics empirically due to concerns of septic arthritis. She was OR the same day for L2-L4 laminectomy by Dr. Reatha Armour.  She was also found to have right knee effusion with work up positive for severe OA and gout flare. Knee aspirated and treated with steroid injection by Dr. Ninfa Linden.    She developed hypoxia with transient CP as well as confusion on 09/14 and was have NSTEMI with new onset AFib. She was started on IV heparin, treated with diuresis a swell as BIPAP. 2D echo showed decrease in EF-30-35% and she underwent cardiac cath revealing 80 to 100% stenosis of LAD with thrombus at site of occlusion. DAPT X 12 months recommended by Dr. Einar Gip. On 09/19, patient reported blurry vision with dizziness and weakness. MRI brain done revealing acute bilateral cerebellar, right parietal and right midbrain strokes felt to be cardiembolic in setting of low EF/A fib. She was making good gains but now has developed dysmetria LUE>RUE with ataxia, blurred vision and balance deficits affecting ADLs and mobility. CIR recommended due to functional decline.     Review of Systems  Constitutional: Positive for malaise/fatigue (for the past 4 months).  HENT: Negative for hearing loss and tinnitus.   Eyes: Positive for blurred vision. Negative for double vision.  Respiratory: Negative for cough and shortness of breath.   Cardiovascular: Negative for chest pain, palpitations and  leg swelling.  Gastrointestinal: Negative for constipation, heartburn and nausea.  Genitourinary: Positive for dysuria and urgency.  Musculoskeletal: Positive for joint pain (chronic right knee pain).  Skin: Negative for itching and rash.  Neurological: Positive for dizziness and weakness. Negative for sensory change and headaches.  Psychiatric/Behavioral: The patient does not have insomnia.      Past Medical History:  Diagnosis Date   Diabetes mellitus without complication ([place])    Hypercholesteremia    Hypertension    Renal disorder    CKD Stage IV    Past Surgical History:  Procedure Laterality Date   LUMBAR LAMINECTOMY/DECOMPRESSION MICRODISCECTOMY N/A 08/19/2020   Procedure: LUMBAR LAMINECTOMY/DECOMPRESSION  Lumbar two-three, Lumbar three-four;  Surgeon: Dawley, Theodoro Doing, DO;  Location: [facility];  Service: Neurosurgery;  Laterality: N/A;   RIGHT/LEFT HEART CATH AND CORONARY ANGIOGRAPHY N/A 08/23/2020   Procedure: RIGHT/LEFT HEART CATH AND CORONARY ANGIOGRAPHY;  Surgeon: [name], MD;  Location: [facility];  Service: Cardiovascular;  Laterality: N/A;    Family History  Problem Relation Age of Onset   Diabetes Father     Social History:  Lives with daughter. Widowed last Oct and who moved to [place] in Jan to be closer to family. Retired Hotel manager. She was independent with cane when out of home. She reports that she has never smoked. She has never used smokeless tobacco. She reports that she does not drink alcohol and does not use drugs.    Allergies: No Known Allergies    Medications Prior to Admission  Medication Sig Dispense Refill   amLODipine (NORVASC) 10 MG tablet Take 1  tablet (10 mg total) by mouth daily. 90 tablet 1   carvedilol (COREG) 12.5 MG tablet Take 1 tablet (12.5 mg total) by mouth 2 (two) times daily with a meal. 180 tablet 1   cholecalciferol (VITAMIN D) 25 MCG (1000 UNIT) tablet Take 3,000 Units by mouth daily.     hydrALAZINE  (APRESOLINE) 25 MG tablet Take 25 mg by mouth in the morning and at bedtime.     losartan (COZAAR) 25 MG tablet Take 25 mg by mouth daily.     oxyCODONE-acetaminophen (PERCOCET/ROXICET) 5-325 MG tablet Take 1 tablet by mouth 2 (two) times daily as needed for severe pain.     rosuvastatin (CRESTOR) 20 MG tablet Take 1 tablet (20 mg total) by mouth at bedtime. 90 tablet 1   cloNIDine (CATAPRES) 0.1 MG tablet Take 1 tablet (0.1 mg total) by mouth 2 (two) times daily. (Patient not taking: Reported on 08/18/2020) 180 tablet 1   HYDROcodone-acetaminophen (NORCO/VICODIN) 5-325 MG tablet Take 1-2 tablets by mouth every 6 (six) hours as needed. (Patient not taking: Reported on 08/18/2020) 20 tablet 0   Misc. Devices MISC Please dispense one power stair lift for at home daily use prn debility. 1 Device 0   predniSONE (DELTASONE) 10 MG tablet Take 2 tablets (20 mg total) by mouth 2 (two) times daily with a meal. (Patient not taking: Reported on 08/18/2020) 20 tablet 0   traZODone (DESYREL) 50 MG tablet Take 0.5-1 tablets (25-50 mg total) by mouth at bedtime as needed for sleep. (Patient not taking: Reported on 03/31/2020) 30 tablet 3    Home: [place] expects to be discharged to:: Private residence Living Arrangements: Children Available Help at Discharge: Family, Available PRN/intermittently Type of Home: House Home Access: Stairs to enter Technical brewer of Steps: 4 Entrance Stairs-Rails: Right, Left Home Layout: One level Bathroom Shower/Tub: Multimedia programmer: Handicapped height Home Equipment: Environmental consultant - 2 wheels  Functional History: Prior Function Level of Independence: Independent Functional Status:  Mobility: Bed Mobility Overal bed mobility: Needs Assistance Bed Mobility: Rolling, Sidelying to Sit Rolling: Min assist Sidelying to sit: Min assist, HOB elevated Supine to sit: HOB elevated, Min guard General bed mobility comments: Step by step  cues to reach for rail, bring LEs to EOB and elevate trunk. Min A to perform. "feel woozy" once upright. Transfers Overall transfer level: Needs assistance Equipment used: None Transfers: Sit to/from Stand Sit to Stand: Mod assist, Min assist General transfer comment: ASsist to power to standing, unsteady and mild ataxic like movements noted once upright. Reports blurry vision. Ambulation/Gait Ambulation/Gait assistance: Mod assist Gait Distance (Feet): 5 Feet Assistive device: 1 person hand held assist Gait Pattern/deviations: Ataxic General Gait Details: Able to take a few steps to get to chair requiring Mod A due to unsteadiness, impaired coordination and posterior lean. Gait velocity: 0.3 m/s Gait velocity interpretation: <1.31 ft/sec, indicative of household ambulator Stairs: Yes Stairs assistance: Min guard Stair Management: One rail Right, Forwards, Step to pattern Number of Stairs: 3 General stair comments: single UE on rail and single UE supported through [place]. LLE leading up and RLE leading down due to pain in R knee    ADL: ADL Overall ADL's : Needs assistance/impaired Eating/Feeding: Independent Grooming: Supervision/safety, Standing Upper Body Bathing: Minimal assistance, Sitting Lower Body Bathing: Moderate assistance, With adaptive equipment, Cueing for back precautions Upper Body Dressing : Minimal assistance, Sitting Lower Body Dressing: Moderate assistance, With adaptive equipment, Cueing for back precautions Toilet Transfer: Min guard, Ambulation, BSC  Toilet Transfer Details (indicate cue type and reason): pt standing and therapist placign commode behind the patient Toileting- Clothing Manipulation and Hygiene: Sit to/from stand, Moderate assistance (to manage underware) Toileting - Clothing Manipulation Details (indicate cue type and reason): no void Functional mobility during ADLs: Min guard General ADL Comments: Pt. ed on back precautioins.    Cognition: Cognition Overall Cognitive Status: No family/caregiver present to determine baseline cognitive functioning Orientation Level: (P) Oriented X4 Cognition Arousal/Alertness: Awake/alert Behavior During Therapy: WFL for tasks assessed/performed Overall Cognitive Status: No family/caregiver present to determine baseline cognitive functioning Area of Impairment: Problem solving, Safety/judgement, Following commands Following Commands: Follows one step commands with increased time Safety/Judgement: Decreased awareness of safety, Decreased awareness of deficits Problem Solving: Slow processing, Decreased initiation, Difficulty sequencing, Requires verbal cues, Requires tactile cues General Comments: Not able to state she had a stroke. Follows commands with increased time/repetition. Pt is sitting in bed that is saturated in wet linens and gown.  Blood pressure 123/76, pulse 98, temperature 98.2 F (36.8 C), temperature source Oral, resp. rate 16, height 5\' 4"  (1.626 m), weight 86.8 kg, SpO2 90 %. Physical Exam General: Alert and oriented x 3, No apparent distress HEENT: Head is normocephalic, atraumatic, impaired visual tracking Neck: Supple without JVD or lymphadenopathy Heart: Reg rate and rhythm. No murmurs rubs or gallops Chest: Decreased breath sounds present.  Abdomen: Soft, non-tender, non-distended, bowel sounds positive. Extremities: Bilateral lower extremity edema Skin: Clean and intact without signs of breakdown Neuro: Speech clear. Able to answer basic orientation questions but had difficulty remembering name of city. Decrease in fine motor movements BUE and LUE with dysmetria. Impaired FTN bilaterally. 4+/5 strength throughout Psych: Pt's affect is appropriate. Pt is cooperative   Results for orders placed or performed during the hospital encounter of 08/18/20 (from the past 24 hour(s))  APTT     Status: Abnormal   Collection Time: 08/27/20  3:23 PM  Result Value  Ref Range   aPTT 57 (H) 24 - 36 seconds  Glucose, capillary     Status: Abnormal   Collection Time: 08/27/20  5:04 PM  Result Value Ref Range   Glucose-Capillary 124 (H) 70 - 99 mg/dL  APTT     Status: Abnormal   Collection Time: 08/28/20 12:36 AM  Result Value Ref Range   aPTT 71 (H) 24 - 36 seconds  CBC     Status: Abnormal   Collection Time: 08/28/20  2:11 AM  Result Value Ref Range   WBC 10.7 (H) 4.0 - 10.5 K/uL   RBC 2.97 (L) 3.87 - 5.11 MIL/uL   Hemoglobin 8.7 (L) 12.0 - 15.0 g/dL   HCT 27.9 (L) 36 - 46 %   MCV 93.9 80.0 - 100.0 fL   MCH 29.3 26.0 - 34.0 pg   MCHC 31.2 30.0 - 36.0 g/dL   RDW 14.1 11.5 - 15.5 %   Platelets 387 150 - 400 K/uL   nRBC 0.0 0.0 - 0.2 %  Basic metabolic panel     Status: Abnormal   Collection Time: 08/28/20  2:11 AM  Result Value Ref Range   Sodium 136 135 - 145 mmol/L   Potassium 4.3 3.5 - 5.1 mmol/L   Chloride 98 98 - 111 mmol/L   CO2 27 22 - 32 mmol/L   Glucose, Bld 137 (H) 70 - 99 mg/dL   BUN 29 (H) 8 - 23 mg/dL   Creatinine, Ser 1.79 (H) 0.44 - 1.00 mg/dL   Calcium 9.3 8.9 -  10.3 mg/dL   GFR calc non Af Amer 26 (L) >60 mL/min   GFR calc Af Amer 30 (L) >60 mL/min   Anion gap 11 5 - 15  APTT     Status: Abnormal   Collection Time: 08/28/20  9:03 AM  Result Value Ref Range   aPTT 52 (H) 24 - 36 seconds  Glucose, capillary     Status: Abnormal   Collection Time: 08/28/20 10:17 AM  Result Value Ref Range   Glucose-Capillary 124 (H) 70 - 99 mg/dL  Glucose, capillary     Status: Abnormal   Collection Time: 08/28/20 11:46 AM  Result Value Ref Range   Glucose-Capillary 153 (H) 70 - 99 mg/dL   CT HEAD WO CONTRAST  Result Date: 08/27/2020 CLINICAL DATA:  79 year old female with dizziness and blurred vision. EXAM: CT HEAD WITHOUT CONTRAST TECHNIQUE: Contiguous axial images were obtained from the base of the skull through the vertex without intravenous contrast. COMPARISON:  None. FINDINGS: Brain: Confluent abnormal hypodensity in the left  cerebellar hemisphere, a especially the superior cerebellar artery territory (coronal image 43 and series 3, image 13. No significant posterior fossa mass effect at this time. No superimposed No acute intracranial hemorrhage identified. No ventriculomegaly. Patchy bilateral cerebral white matter hypodensity, slightly greater in the left hemisphere. Probable dilated perivascular space right basal ganglia. No acute cortically based cerebral hemisphere infarct is identified. Vascular: Calcified atherosclerosis at the skull base. No suspicious intracranial vascular hyperdensity. Skull: Negative.  Hyperostosis, normal variant. Sinuses/Orbits: Visualized paranasal sinuses and mastoids are clear. Other: Postoperative changes to both globes. No acute orbit or scalp soft tissue finding. IMPRESSION: 1. Positive for acute to subacute Infarct of the Left Cerebellum, primarily the left SCA territory. No associated hemorrhage or mass effect at this time. 2. Supratentorial changes of chronic small vessel disease. No other acute intracranial abnormality. Electronically Signed   By: Genevie Ann M.D.   On: 08/27/2020 18:26   MR ANGIO HEAD WO CONTRAST  Result Date: 08/28/2020 CLINICAL DATA:  Stroke follow-up.  Dizziness and blurred vision EXAM: MRI HEAD WITHOUT CONTRAST MRA HEAD WITHOUT CONTRAST TECHNIQUE: Multiplanar, multiecho pulse sequences of the brain and surrounding structures were obtained without intravenous contrast. Angiographic images of the head were obtained using MRA technique without contrast. COMPARISON:  Head CT from yesterday FINDINGS: MRI HEAD FINDINGS Brain: Confirmed acute infarct in the superior left cerebellum, superior cerebellar artery distribution. Small acute to subacute infarcts seen in the lateral right cerebellum and midbrain. Moderate area of acute infarction in the low right parietal cortex. Chronic small vessel ischemia in the cerebral white matter, moderately extensive. No evidence of hemorrhage,  hydrocephalus, or mass. The scan is progressively motion degraded, which could obscure findings. Vascular: Normal flow voids.  See below for MRA Skull and upper cervical spine: Normal marrow signal Sinuses/Orbits: Negative MRA HEAD FINDINGS Left dominant vertebral artery. Symmetric carotid artery size. Fetal type left PCA. Extensive atheromatous irregularity of large and medium size vessels. Motion artifact likely contributes to the degree of vessel irregularity. In the anterior circulation most notable is a moderate to advanced left M1 segment stenosis. In the posterior circulation there is extensive atheromatous irregularity of the basilar with prominent narrowing (over 50%) just before the SCA and PCA origin. Advanced left P1 2 junction and right P3, P4 stenoses. IMPRESSION: Brain MRI: 1. Acute infarcts in the superior left cerebellum and right parietal cortex. 2. Smaller infarcts seen in the right cerebellum and midbrain which are likely subacute. 3. Background of  chronic small vessel ischemia. Intracranial MRA: Severe intracranial atherosclerosis with multifocal large and medium vessel stenosis as described above. Electronically Signed   By: Monte Fantasia M.D.   On: 08/28/2020 08:28   MR BRAIN WO CONTRAST  Result Date: 08/28/2020 CLINICAL DATA:  Stroke follow-up.  Dizziness and blurred vision EXAM: MRI HEAD WITHOUT CONTRAST MRA HEAD WITHOUT CONTRAST TECHNIQUE: Multiplanar, multiecho pulse sequences of the brain and surrounding structures were obtained without intravenous contrast. Angiographic images of the head were obtained using MRA technique without contrast. COMPARISON:  Head CT from yesterday FINDINGS: MRI HEAD FINDINGS Brain: Confirmed acute infarct in the superior left cerebellum, superior cerebellar artery distribution. Small acute to subacute infarcts seen in the lateral right cerebellum and midbrain. Moderate area of acute infarction in the low right parietal cortex. Chronic small vessel  ischemia in the cerebral white matter, moderately extensive. No evidence of hemorrhage, hydrocephalus, or mass. The scan is progressively motion degraded, which could obscure findings. Vascular: Normal flow voids.  See below for MRA Skull and upper cervical spine: Normal marrow signal Sinuses/Orbits: Negative MRA HEAD FINDINGS Left dominant vertebral artery. Symmetric carotid artery size. Fetal type left PCA. Extensive atheromatous irregularity of large and medium size vessels. Motion artifact likely contributes to the degree of vessel irregularity. In the anterior circulation most notable is a moderate to advanced left M1 segment stenosis. In the posterior circulation there is extensive atheromatous irregularity of the basilar with prominent narrowing (over 50%) just before the SCA and PCA origin. Advanced left P1 2 junction and right P3, P4 stenoses. IMPRESSION: Brain MRI: 1. Acute infarcts in the superior left cerebellum and right parietal cortex. 2. Smaller infarcts seen in the right cerebellum and midbrain which are likely subacute. 3. Background of chronic small vessel ischemia. Intracranial MRA: Severe intracranial atherosclerosis with multifocal large and medium vessel stenosis as described above. Electronically Signed   By: Monte Fantasia M.D.   On: 08/28/2020 08:28   VAS US CAROTID  Result Date: 08/28/2020 Carotid Arterial Duplex Study Indications:  TIA, CVA and Syncope. Risk Factors: Hypertension, hyperlipidemia. Performing Technologist: Griffin Basil  Examination Guidelines: A complete evaluation includes B-mode imaging, spectral Doppler, color Doppler, and power Doppler as needed of all accessible portions of each vessel. Bilateral testing is considered an integral part of a complete examination. Limited examinations for reoccurring indications may be performed as noted.  Right Carotid Findings: +----------+--------+--------+--------+------------------+------------------+             PSV cm/s EDV  cm/s Stenosis Plaque Description Comments            +----------+--------+--------+--------+------------------+------------------+  CCA Prox   159      25                                                       +----------+--------+--------+--------+------------------+------------------+  CCA Distal 118      29                                   intimal thickening  +----------+--------+--------+--------+------------------+------------------+  ICA Prox   80       36       1-39%  intimal thickening  +----------+--------+--------+--------+------------------+------------------+  ICA Distal 57       22                                                       +----------+--------+--------+--------+------------------+------------------+  ECA        100      14                                                       +----------+--------+--------+--------+------------------+------------------+ +----------+--------+-------+--------+-------------------+             PSV cm/s EDV cms Describe Arm Pressure (mmHG)  +----------+--------+-------+--------+-------------------+  Subclavian 133      14                                    +----------+--------+-------+--------+-------------------+ +---------+--------+--+--------+--+----------+  Vertebral PSV cm/s 37 EDV cm/s 12 Retrograde  +---------+--------+--+--------+--+----------+  Left Carotid Findings: +----------+--------+--------+--------+------------------+------------------+             PSV cm/s EDV cm/s Stenosis Plaque Description Comments            +----------+--------+--------+--------+------------------+------------------+  CCA Prox   102      27                                                       +----------+--------+--------+--------+------------------+------------------+  CCA Distal 121      32                                   intimal thickening  +----------+--------+--------+--------+------------------+------------------+  ICA Prox   116      39        1-39%                       intimal thickening  +----------+--------+--------+--------+------------------+------------------+  ICA Distal 132      52                                                       +----------+--------+--------+--------+------------------+------------------+  ECA        106      19                                                       +----------+--------+--------+--------+------------------+------------------+ +----------+--------+--------+--------+-------------------+             PSV cm/s EDV cm/s Describe Arm Pressure (mmHG)  +----------+--------+--------+--------+-------------------+  Subclavian 190      5                                      +----------+--------+--------+--------+-------------------+ +---------+--------+--+--------+--+---------+  Vertebral PSV cm/s 70 EDV cm/s 31 Antegrade  +---------+--------+--+--------+--+---------+   Summary: Right Carotid: Velocities in the right ICA are consistent with a 1-39% stenosis. Left Carotid: Velocities in the left ICA are consistent with a 1-39% stenosis. Vertebrals: Left vertebral artery demonstrates antegrade flow. Right vertebral             artery demonstrates retrograde flow. *See table(s) above for measurements and observations.     Preliminary    Assessment/Plan: Diagnosis: Acute left cerebellar stroke 1. Does the need for close, 24 hr/day medical supervision in concert with the patient's rehab needs make it unreasonable for this patient to be served in a less intensive setting? Yes 2. Co-Morbidities requiring supervision/potential complications: new onset atrial fibrillation, DM, HTN, lipidemia s/p L2-L4 laminectomy, sepsis/SIRS, acute respiratory failure, pulmonary edema 3. Due to bladder management, bowel management, safety, skin/wound care, disease management, medication administration, pain management and patient education, does the patient require 24 hr/day rehab nursing? Yes 4. Does the patient require coordinated care  of a physician, rehab nurse, therapy disciplines of PT, OT to address physical and functional deficits in the context of the above medical diagnosis(es)? Yes Addressing deficits in the following areas: balance, endurance, locomotion, strength, transferring, bowel/bladder control, bathing, dressing, feeding, grooming, toileting and psychosocial support 5. Can the patient actively participate in an intensive therapy program of at least 3 hrs of therapy per day at least 5 days per week? Yes 6. The potential for patient to make measurable gains while on inpatient rehab is excellent 7. Anticipated functional outcomes upon discharge from inpatient rehab are mod I  with PT, mod I with OT, Iwith SLP. 8. Estimated rehab length of stay to reach the above functional goals is: 2-3 weeks 9. Anticipated discharge destination: Home 10. Overall Rehab/Functional Prognosis: excellent  RECOMMENDATIONS: This patient's condition is appropriate for continued rehabilitative care in the following setting: CIR Patient has agreed to participate in recommended program. Yes Note that insurance prior authorization may be required for reimbursement for recommended care.  Comment: Thank you for this consult. Admission coordinator to follow.   I have personally performed a face to face diagnostic evaluation, including, but not limited to relevant history and physical exam findings, of this patient and developed relevant assessment and plan.  Additionally, I have reviewed and concur with the physician assistant's documentation above.  Leeroy Cha, MD  Bary Leriche, PA-C 08/28/2020

## 2020-08-28 NOTE — Progress Notes (Signed)
Subjective:  She is presently doing well denies dyspnea or chest pain. Events regarding CVA noted.   Intake/Output from previous day:  I/O last 3 completed shifts: In: 1023.7 [P.O.:600; I.V.:423.7] Out: 1150 [Urine:1150] No intake/output data recorded.  Blood pressure 126/74, pulse 98, temperature 98.1 F (36.7 C), temperature source Oral, resp. rate 19, height 5' 4"  (1.626 m), weight 87.1 kg, SpO2 100 %. Physical Exam Cardiovascular:     Rate and Rhythm: Normal rate and regular rhythm.     Pulses: Intact distal pulses.     Heart sounds: Normal heart sounds. No murmur heard.  No gallop.      Comments: No leg edema, no JVD. Pulmonary:     Effort: Pulmonary effort is normal.     Breath sounds: Normal breath sounds.  Abdominal:     General: Bowel sounds are normal.     Palpations: Abdomen is soft.    Lab Results: BMP BNP (last 3 results) Recent Labs    08/25/20 0637 08/26/20 0505 08/27/20 0818  BNP 517.4* 675.7* 424.0*    ProBNP (last 3 results) No results for input(s): PROBNP in the last 8760 hours. BMP Latest Ref Rng & Units 08/28/2020 08/27/2020 08/26/2020  Glucose 70 - 99 mg/dL 137(H) 133(H) 132(H)  BUN 8 - 23 mg/dL 29(H) 34(H) 38(H)  Creatinine 0.44 - 1.00 mg/dL 1.79(H) 1.68(H) 1.87(H)  BUN/Creat Ratio 12 - 28 - - -  Sodium 135 - 145 mmol/L 136 137 139  Potassium 3.5 - 5.1 mmol/L 4.3 6.2(H) 3.9  Chloride 98 - 111 mmol/L 98 101 102  CO2 22 - 32 mmol/L 27 28 29   Calcium 8.9 - 10.3 mg/dL 9.3 9.2 8.7(L)   Hepatic Function Latest Ref Rng & Units 08/26/2020 08/22/2020 08/18/2020  Total Protein 6.5 - 8.1 g/dL 6.5 7.2 8.0  Albumin 3.5 - 5.0 g/dL 2.5(L) 2.8(L) 3.9  AST 15 - 41 U/L 50(H) 190(H) 19  ALT 0 - 44 U/L 45(H) 66(H) 16  Alk Phosphatase 38 - 126 U/L 53 79 79  Total Bilirubin 0.3 - 1.2 mg/dL 0.9 0.5 0.4  Bilirubin, Direct 0.0 - 0.2 mg/dL 0.2 - -   CBC Latest Ref Rng & Units 08/28/2020 08/27/2020 08/26/2020  WBC 4.0 - 10.5 K/uL 10.7(H) 12.3(H) 11.9(H)  Hemoglobin  12.0 - 15.0 g/dL 8.7(L) 8.9(L) 8.4(L)  Hematocrit 36 - 46 % 27.9(L) 27.8(L) 26.2(L)  Platelets 150 - 400 K/uL 387 328 344   Lipid Panel  No results found for: CHOL, TRIG, HDL, CHOLHDL, VLDL, LDLCALC, LDLDIRECT Cardiac Panel (last 3 results) No results for input(s): CKTOTAL, CKMB, TROPONINI, RELINDX in the last 72 hours.  HEMOGLOBIN A1C Lab Results  Component Value Date   HGBA1C 6.7 (H) 08/27/2020   MPG 145.59 08/27/2020   TSH Recent Labs    03/13/20 0949 08/22/20 1759  TSH 1.190 1.054     Scheduled Meds:  amiodarone  200 mg Oral Daily   aspirin  81 mg Oral Daily   bumetanide (BUMEX) IV  1 mg Intravenous Q12H   cholecalciferol  3,000 Units Oral Daily   colchicine  0.3 mg Oral Daily   diclofenac Sodium  2 g Topical QID   isosorbide-hydrALAZINE  2 tablet Oral TID   metoprolol tartrate  25 mg Oral BID   pantoprazole  40 mg Oral BID   polyethylene glycol  17 g Oral BID   rosuvastatin  40 mg Oral QHS   senna-docusate  1 tablet Oral BID   sodium chloride flush  10-40 mL Intracatheter Q12H  sucralfate  1 g Oral TID WC & HS   Continuous Infusions:  sodium chloride Stopped (08/24/20 1757)   heparin 850 Units/hr (08/28/20 0902)   methocarbamol (ROBAXIN) IV     PRN Meds:.sodium chloride, acetaminophen **OR** acetaminophen, alum & mag hydroxide-simeth, bisacodyl, hydrALAZINE, HYDROcodone-acetaminophen, HYDROmorphone (DILAUDID) injection, menthol-cetylpyridinium **OR** phenol, methocarbamol **OR** methocarbamol (ROBAXIN) IV, morphine injection, nitroGLYCERIN, ondansetron **OR** ondansetron (ZOFRAN) IV, oxyCODONE-acetaminophen, promethazine, sodium chloride flush  Assessment/Plan:  1.  Peri-operative non-ST elevation myocardial infarction involving proximal LAD.  Completed infarct. 2.  Acute systolic heart failure 3.  Acute renal insufficiency, contributed by decreased cardiac output.  Patient only received 17 mL of contrast during coronary angiography.  Stage IV chronic kidney  disease.  Baseline stage IIIb chronic kidney disease.  Do not suspect contrast nephropathy but overall hemodynamic low output acute renal failure. 4.  Hypertension well controlled. 5.  New onset atrial fibrillation, related to ACS. 6. Embolic stroke probably related to A. Fib.  CHA2DS2-VASc Score is 8.  Yearly risk of stroke: >10% (A, F, DM, HTN, Vasc Dz, Stroke).  Score of 1=0.6; 2=2.2; 3=3.2; 4=4.8; 5=7.2; 6=9.8; 7=>9.8) -(CHF; HTN; vasc disease DM,  Female = 1; Age <65 =0; 65-74 = 1,  >75 =2; stroke/embolism= 2).    Recommendation:   Patient's acute CHF has resolved and stable, she is tolerating cardiac medications well.  She has new stroke with minimal hemorrhagic conversion.  Will wait on neurology to give me recommendation regarding starting her back on aspirin and also Eliquis or Xarelto.  As previously recommended, no plans for coronary angiography for now until she is medically stable including renal function, new stroke.  Blood pressures well controlled, she is maintaining sinus rhythm.  Continue amiodarone and present medications.   [name], MD, [facility] 08/28/2020, 9:14 AM Office: [phone]

## 2020-08-28 NOTE — Telephone Encounter (Signed)
Correction made.   Appt with st 9/28 at 11:45am   Thank you

## 2020-08-28 NOTE — Progress Notes (Signed)
Inpatient Rehabilitation Admissions Coordinator  I contacted pt's daughter by phone to discuss goals and expectations of a Cir admit. Daughter is concerned that ELOS of CIR is not long enough for patient to then go home independently while she works 4 am until 1 pm. Daughter prefers to continue to pursue [facility] admit. I have alerted acute team and TOC. I will not pursue insurance approval for CIR admit. Please call me with any questions.  Danne Baxter, RN, MSN Rehab Admissions Coordinator [phone] 08/28/2020 4:39 PM

## 2020-08-28 NOTE — Progress Notes (Signed)
[facility]  PROGRESS NOTE  Kristin [name] URK:[id number] DOB: 10-29-1941 DOA: 08/18/2020 PCP: Caren Macadam, MD   Brief HPI:   79 year old female with history of chronic back pain, hypertension, diabetes mellitus type 2 came to ED with complaints of bilateral lower extremity weakness and worsening lower back pain.  Patient had an MRI on 04/13/2020 which showed advanced lumbar facet arthrosis with multilevel degenerative anterolisthesis, severe spinal stenosis at L2-3 and L3-4.  Mild to moderate spinal stenosis at L4-5.  MRI ordered during this admission showed similar changes.  Neurosurgery was consulted and surgical intervention was recommended for possible phlegmon at L3-4.  No evidence of infection was found during lumbar laminectomy.  Patient has been receiving IV antibiotics.  Patient developed acute hypoxemic respiratory failure on 08/22/2020, likely from pulmonary edema.  Cardiology was consulted.    Subjective   Patient seen and examined, developed dizziness yesterday.  CT head showed acute large cerebellar stroke.  Neurology was consulted.   Assessment/Plan:     1. SIRS-patient presents with fever, tachycardia, tachypnea.  No clear source of infection.  No evidence of infection of lumbar spine or knee joint infection.  Patient was started on vancomycin and cefepime, vancomycin has been discontinued as of 08/21/2020.  Continue with IV cefepime at this time.  No evidence found during laminectomy and decompression.  Blood culture showed no growth.  Urine culture showed insignificant growth.  Synovial fluid right knee showed no growth.  CT abdomen was negative for infection.   2. Left cerebellar stroke-patient developed dizziness yesterday, CT head obtained showed large left cerebellar stroke.  Neurology was consulted.  MRI brain obtained showed acute infarct in superior left cerebellum and right parietal cortex.  Small infarcts in right cerebellum and midbrain likely subacute.  Patient  is already on aspirin and heparin.  Carotid Dopplers are pending. 3. Neck pain/muscle spasm-patient already has Robaxin as needed ordered. 4. L2 -L4 severe lumbar stenosis-patient presented with lower extremity pain and numbness.  Underwent bilateral L2, L3, partial L4 laminectomy for decompression of neural elements on 08/19/2020.  No evidence of infection was found.  PT evaluation recommended CIR. Patient is currently on heparin.  Monitor for hematoma/lower extremity weakness. 5. Acute hypoxemic respiratory failure-secondary pulmonary edema.  BNP was elevated.  Cardiology was consulted.  Patient was started on IV Lasix.  Lasix has been changed to Bumex. 6. NSTEMI-troponin was elevated at 27,000, patient started on low-dose heparin with PTT goal 50-60.  No bolus.  No aspirin for now due to increased risk of bleeding.  Patient currently on heparin, IV nitroglycerin for pulmonary edema, statin.  IV nitroglycerin has been discontinued.  Patient underwent diagnostic catheterization  which showed proximal LAD occluded, LVEF 35% of the entire anterior, apical and apical inferior akinesis.  Continue heparin GTT. neurosurgery recommends stent/dual antiplatelet therapy until Monday or Tuesday which will be postop 9-day or 10 of her intervention.  Patient has been started on aspirin 81 mg p.o. daily.  Plan to start Eliquis when okay with neurology.  Neurosurgery has cleared patient to get Eliquis.  Cardiology plans for cardiac cath in 2 to 3 weeks.  Cardiac cath has been canceled during this admission due to worsening renal function. 7. Atrial fibrillation, new onset-patient currently on IV heparin.   CHA2DS2VASc score is 5, continue anticoagulation with IV heparin.  Plan to switch to Eliquis when okay with neurology. 8. Bilateral knee pain-pseudogout.  Patient underwent arthrocentesis of right knee.  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  Orthopedics  following.  Patient underwent injection of  Depo-Medrol into the right knee.  Continue Voltaren gel. 9. Hypertension-continue Coreg, clonidine patch, as needed hydralazine. 10. Hyperkalemia-potassium elevated 6.2.  Will hold potassium supplementation.  1 dose of Lokelma 10 g p.o. will be given.  Follow BMP in am. 11. Urine retention-Foley catheter in place. 12. Acute kidney injury-creatinine is elevated at 1.79.  Likely from diuresis.      COVID-19 Labs  No results for input(s): DDIMER, FERRITIN, LDH, CRP in the last 72 hours.  Lab Results  Component Value Date    NEGATIVE 08/18/2020     Scheduled medications:   . amiodarone  200 mg Oral Daily  . aspirin  81 mg Oral Daily  . bumetanide (BUMEX) IV  1 mg Intravenous Q12H  . cholecalciferol  3,000 Units Oral Daily  . colchicine  0.3 mg Oral Daily  . diclofenac Sodium  2 g Topical QID  . isosorbide-hydrALAZINE  2 tablet Oral TID  . metoprolol tartrate  25 mg Oral BID  . pantoprazole  40 mg Oral BID  . polyethylene glycol  17 g Oral BID  . rosuvastatin  40 mg Oral QHS  . senna-docusate  1 tablet Oral BID  . sodium chloride flush  10-40 mL Intracatheter Q12H  . sucralfate  1 g Oral TID WC & HS         CBG: Recent Labs  Lab 08/27/20 0748 08/27/20 1108 08/27/20 1704 08/28/20 1017 08/28/20 1146  GLUCAP 125* 97 124* 124* 153*    SpO2: 90 % O2 Flow Rate (L/min): 4 L/min FiO2 (%): 80 %    CBC: Recent Labs  Lab 08/24/20 1205 08/25/20 0637 08/26/20 0505 08/27/20 0818 08/28/20 0211  WBC 11.2* 11.6* 11.9* 12.3* 10.7*  HGB 9.4* 9.2* 8.4* 8.9* 8.7*  HCT 29.3* 28.7* 26.2* 27.8* 27.9*  MCV 89.1 90.8 90.3 93.9 93.9  PLT 305 327 344 328 409    Basic Metabolic Panel: Recent Labs  Lab 08/22/20 1759 08/23/20 0056 08/24/20 1205 08/25/20 0637 08/26/20 0505 08/27/20 0818 08/28/20 0211  NA 130*   < > 132* 134* 139 137 136  K 3.5   < > 3.5 4.1 3.9 6.2* 4.3  CL 94*   < > 94* 97* 102 101 98  CO2 21*   < > 28 29 29 28 27   GLUCOSE 178*   < > 189*  144* 132* 133* 137*  BUN 34*   < > 37* 36* 38* 34* 29*  CREATININE 1.13*   < > 1.66* 1.83* 1.87* 1.68* 1.79*  CALCIUM 9.0   < > 8.4* 8.5* 8.7* 9.2 9.3  MG 2.0  --   --   --   --   --   --    < > = values in this interval not displayed.     Liver Function Tests: Recent Labs  Lab 08/22/20 1759 08/26/20 2304  AST 190* 50*  ALT 66* 45*  ALKPHOS 79 53  BILITOT 0.5 0.9  PROT 7.2 6.5  ALBUMIN 2.8* 2.5*     Antibiotics: Anti-infectives (From admission, onward)   Start     Dose/Rate Route Frequency Ordered Stop   08/20/20 0400  vancomycin (VANCOREADY) IVPB 750 mg/150 mL  Status:  Discontinued        750 mg 150 mL/hr over 60 Minutes Intravenous Every 12 hours 08/19/20 1005 08/21/20 1101   08/19/20 1030  ceFEPIme (MAXIPIME) 2 g in sodium chloride 0.9 % 100 mL IVPB  2 g 200 mL/hr over 30 Minutes Intravenous Every 12 hours 08/19/20 1005 08/24/20 1900   08/19/20 1030  vancomycin (VANCOREADY) IVPB 1750 mg/350 mL        1,750 mg 175 mL/hr over 120 Minutes Intravenous  Once 08/19/20 1005 08/19/20 1907   08/19/20 1030  ceFAZolin (ANCEF) IVPB 2g/100 mL premix        2 g 200 mL/hr over 30 Minutes Intravenous  Once 08/19/20 1019 08/19/20 1120   08/19/20 1021  ceFAZolin (ANCEF) 2-4 GM/100ML-% IVPB       Note to Pharmacy: Henrine Screws   : cabinet override      08/19/20 1021 08/19/20 1124   08/18/20 0515  ceFEPIme (MAXIPIME) 2 g in sodium chloride 0.9 % 100 mL IVPB        2 g 200 mL/hr over 30 Minutes Intravenous  Once 08/18/20 0504 08/18/20 0612   08/18/20 0515  metroNIDAZOLE (FLAGYL) IVPB 500 mg        500 mg 100 mL/hr over 60 Minutes Intravenous  Once 08/18/20 0504 08/18/20 0705   08/18/20 0515  vancomycin (VANCOCIN) IVPB 1000 mg/200 mL premix  Status:  Discontinued        1,000 mg 200 mL/hr over 60 Minutes Intravenous  Once 08/18/20 0504 08/18/20 0507   08/18/20 0515  vancomycin (VANCOREADY) IVPB 1750 mg/350 mL        1,750 mg 175 mL/hr over 120 Minutes Intravenous  Once  08/18/20 0507 08/18/20 1958       DVT prophylaxis: SCDs  Code Status: Full code  Family Communication: No family at bedside    Status is: Inpatient  Dispo: The patient is from: Home              Anticipated d/c is to: Skilled nursing facility              Anticipated d/c date is: 08/31/2020              Patient currently not medically stable for discharge  Barrier to discharge-ongoing treatment for acute hypoxemic respiratory failure, pulmonary edema     Consultants:  Neurosurgery  Orthopedics   Objective   Vitals:   08/27/20 1900 08/27/20 2332 08/28/20 0900 08/28/20 1139  BP: 136/72 126/74  123/76  Pulse: 92 98    Resp: 20 19  16   Temp: 98.2 F (36.8 C) 98.1 F (36.7 C)  98.2 F (36.8 C)  TempSrc: Oral Oral  Oral  SpO2: 100%   90%  Weight:   86.8 kg   Height:        Intake/Output Summary (Last 24 hours) at 08/28/2020 1439 Last data filed at 08/28/2020 0600 Gross per 24 hour  Intake 512.31 ml  Output 350 ml  Net 162.31 ml    09/18 1901 - 09/20 0700 In: 1023.7 [P.O.:600; I.V.:423.7] Out: 1150 [Urine:1150]  Filed Weights   08/26/20 0607 08/27/20 0550 08/28/20 0900  Weight: 80.2 kg 87.1 kg 86.8 kg    Physical Examination:  General-appears in no acute distress Heart-S1-S2, regular, no murmur auscultated Lungs-clear to auscultation bilaterally, no wheezing or crackles auscultated Abdomen-soft, nontender, no organomegaly Extremities-no edema in the lower extremities Neuro-alert, oriented x3, no focal deficit noted   Data Reviewed:   Recent Results (from the past 240 hour(s))  Surgical pcr screen     Status: None   Collection Time: 08/19/20  9:45 AM   Specimen: Nasal Mucosa; Nasal Swab  Result Value Ref Range Status   MRSA, PCR NEGATIVE NEGATIVE Final  Staphylococcus aureus NEGATIVE NEGATIVE Final    Comment: (NOTE) The Xpert SA Assay (FDA approved for NASAL specimens in patients 3 years of age and older), is one component of a  comprehensive surveillance program. It is not intended to diagnose infection nor to guide or monitor treatment. Performed at [facility], [place] [address] [place], [place] [address]   Body fluid culture     Status: None   Collection Time: 08/19/20  2:50 PM   Specimen: Body Fluid  Result Value Ref Range Status   Specimen Description FLUID  Final   Special Requests SYNOVIAL RIGHT KNEE  Final   Gram Stain   Final    MODERATE WBC PRESENT, PREDOMINANTLY PMN NO ORGANISMS SEEN    Culture   Final    NO GROWTH 3 DAYS Performed at [facility], [address], [place], [place] [address]    Report Status 08/23/2020 FINAL  Final    Recent Labs  Lab 08/26/20 2304  LIPASE 27   No results for input(s): AMMONIA in the last 168 hours.  Cardiac Enzymes: No results for input(s): CKTOTAL, CKMB, CKMBINDEX, TROPONINI in the last 168 hours. BNP (last 3 results) Recent Labs    08/25/20 0637 08/26/20 0505 08/27/20 0818  BNP 517.4* 675.7* 424.0*     [place]   [facility] If 7PM-7AM, please contact night-coverage at [email or web address], Office  [phone]   08/28/2020, 2:39 PM  LOS: 10 days

## 2020-08-28 NOTE — Telephone Encounter (Signed)
Dr Einar Gip, please note 9/28 is a Tuesday - Did you meant to give her 9/27? If so, I added her to Monday 9/27 at 11:45am  w/ TOC.- Please advice if this needs to be changed.   Thank you   -Leda Quail

## 2020-08-28 NOTE — Care Management Important Message (Signed)
Important Message  Patient Details  Name: Kristin [name] MRN: [id number] Date of Birth: July 12, 1941   Medicare Important Message Given:  Yes     Shelda Altes 08/28/2020, 11:37 AM

## 2020-08-28 NOTE — Telephone Encounter (Signed)
Under ST not me. Any day is fine

## 2020-08-28 NOTE — Progress Notes (Signed)
Carotid study completed.   See CVProc for preliminary results.   Teasia Zapf, RDMS, RVT 

## 2020-08-28 NOTE — Progress Notes (Signed)
Physical Therapy Treatment Patient Details Name: Kristin [name] MRN: [id number] DOB: 24-Apr-1941 Today's Date: 08/28/2020    History of Present Illness This is an 79 year old female with a history of HTN, CKD, DM, chronic low back pain that presents with worsening low back pain and lower extremity weakness. Now s/p Lumbar Laminectomy for decompression; Also with knee pain, Ortho is addressing, considering injections. 08/22/20 developed acute decompensated systolic heart failure and pulmonary edema needing BiPAP. Brain MRI 9/20-Acute infarcts in the superior left cerebellum and right parietal cortex.Smaller infarcts in right cerebellum and midbrain whichare likely subacute.    PT Comments    Performed re-evaluation of patient this session due to change in functional/cognitive status and new diagnosis of CVA. Pt presents with dysmetria LUE>RUE, dysdiadochokinesia BLEs/UEs, incoordination, blurry vision, impaired balance and truncal ataxia worsened with movement. Also reports feeling of "wooziness." Pt requires step by step cues for sequencing to perform tasks. Requires Min-Mod A for bed mobility, transfers and gait. Discharge recommendation updated to CIR to maximize independence and mobility prior to return home. Will follow acutely.   Follow Up Recommendations  CIR;Supervision for mobility/OOB;Supervision/Assistance - 24 hour     Equipment Recommendations  Other (comment) (defer to next venue)    Recommendations for Other Services Rehab consult     Precautions / Restrictions Precautions Precautions: Back Precaution Booklet Issued: No Restrictions Weight Bearing Restrictions: No    Mobility  Bed Mobility Overal bed mobility: Needs Assistance Bed Mobility: Rolling;Sidelying to Sit Rolling: Min assist Sidelying to sit: Min assist;HOB elevated       General bed mobility comments: Step by step cues to reach for rail, bring LEs to EOB and elevate trunk. Min A to perform. "feel woozy"  once upright.  Transfers Overall transfer level: Needs assistance Equipment used: None Transfers: Sit to/from Stand Sit to Stand: Mod assist;Min assist         General transfer comment: ASsist to power to standing, unsteady and mild ataxic like movements noted once upright. Reports blurry vision.  Ambulation/Gait Ambulation/Gait assistance: Mod assist Gait Distance (Feet): 5 Feet Assistive device: 1 person hand held assist Gait Pattern/deviations: Ataxic   Gait velocity interpretation: <1.31 ft/sec, indicative of household ambulator General Gait Details: Able to take a few steps to get to chair requiring Mod A due to unsteadiness, impaired coordination and posterior lean.   Stairs             Wheelchair Mobility    Modified Rankin (Stroke Patients Only) Modified Rankin (Stroke Patients Only) Pre-Morbid Rankin Score: No significant disability Modified Rankin: Moderately severe disability     Balance Overall balance assessment: Needs assistance Sitting-balance support: Feet supported;Single extremity supported Sitting balance-Leahy Scale: Fair Sitting balance - Comments: Some mild ataxic movements through trunk; educated on gaze stabilization.   Standing balance support: During functional activity Standing balance-Leahy Scale: Poor Standing balance comment: Requires external support. Ataxia worse in standing.                            Cognition Arousal/Alertness: Awake/alert Behavior During Therapy: WFL for tasks assessed/performed Overall Cognitive Status: No family/caregiver present to determine baseline cognitive functioning Area of Impairment: Problem solving;Safety/judgement;Following commands                       Following Commands: Follows one step commands with increased time Safety/Judgement: Decreased awareness of safety;Decreased awareness of deficits   Problem Solving: Slow processing;Decreased initiation;Difficulty  sequencing;Requires verbal  cues;Requires tactile cues General Comments: Not able to state she had a stroke. Follows commands with increased time/repetition. Pt is sitting in bed that is saturated in wet linens and gown.      Exercises      General Comments General comments (skin integrity, edema, etc.): Sp02 in high 90s on RA during session.No SOB noted.  New findings from re evaluation since new CVA- dysmetria LUE>RUE and difficulty with finger to nose, dysdiadochokinesia BLEs/UEs, incoordination, blurry vision and truncal ataxia      Pertinent Vitals/Pain Pain Assessment: No/denies pain    Home Living                      Prior Function            PT Goals (current goals can now be found in the care plan section) Progress towards PT goals: Not progressing toward goals - comment (due to new stroke)    Frequency    Min 5X/week      PT Plan Discharge plan needs to be updated    Co-evaluation              AM-PAC PT "6 Clicks" Mobility   Outcome Measure  Help needed turning from your back to your side while in a flat bed without using bedrails?: A Little Help needed moving from lying on your back to sitting on the side of a flat bed without using bedrails?: A Little Help needed moving to and from a bed to a chair (including a wheelchair)?: A Lot Help needed standing up from a chair using your arms (e.g., wheelchair or bedside chair)?: A Lot Help needed to walk in hospital room?: A Lot Help needed climbing 3-5 steps with a railing? : A Lot 6 Click Score: 14    End of Session Equipment Utilized During Treatment: Gait belt Activity Tolerance: Patient tolerated treatment well Patient left: in chair;with call bell/phone within reach;with nursing/sitter in room (to give pt a bath) Nurse Communication: Mobility status PT Visit Diagnosis: Unsteadiness on feet (R26.81);Other abnormalities of gait and mobility (R26.89);Ataxic gait (R26.0);Difficulty in walking,  not elsewhere classified (R26.2)     Time: 3419-3790 PT Time Calculation (min) (ACUTE ONLY): 16 min  Charges:    1 Re-eval charge                    Zettie Cooley, DPT Acute Rehabilitation Services Pager [phone] Office [phone]       Marguarite Arbour A Sabra Heck 08/28/2020, 10:40 AM

## 2020-08-28 NOTE — Progress Notes (Signed)
STROKE TEAM PROGRESS NOTE   INTERVAL HISTORY RN at bedside.  Patient sitting in chair, stating that she is to have blurry vision, but dizziness and vomiting much improved.  She still feels generalized weakness.  On exam, she still has left finger-to-nose ataxia.  No diplopia.  She is on aspirin 81 and heparin IV.  Plan to transition to Eliquis in 2 days per neurosurgery.  Vitals:   08/27/20 1900 08/27/20 2332 08/28/20 0900 08/28/20 1139  BP: 136/72 126/74  123/76  Pulse: 92 98    Resp: 20 19  16   Temp: 98.2 F (36.8 C) 98.1 F (36.7 C)  98.2 F (36.8 C)  TempSrc: Oral Oral  Oral  SpO2: 100%   90%  Weight:   86.8 kg   Height:       CBC:  Recent Labs  Lab 08/27/20 0818 08/28/20 0211  WBC 12.3* 10.7*  HGB 8.9* 8.7*  HCT 27.8* 27.9*  MCV 93.9 93.9  PLT 328 161   Basic Metabolic Panel:  Recent Labs  Lab 08/22/20 1759 08/23/20 0056 08/27/20 0818 08/28/20 0211  NA 130*   < > 137 136  K 3.5   < > 6.2* 4.3  CL 94*   < > 101 98  CO2 21*   < > 28 27  GLUCOSE 178*   < > 133* 137*  BUN 34*   < > 34* 29*  CREATININE 1.13*   < > 1.68* 1.79*  CALCIUM 9.0   < > 9.2 9.3  MG 2.0  --   --   --    < > = values in this interval not displayed.   Lipid Panel: No results for input(s): CHOL, TRIG, HDL, CHOLHDL, VLDL, LDLCALC in the last 168 hours. HgbA1c:  Recent Labs  Lab 08/27/20 0818  HGBA1C 6.7*   Urine Drug Screen: No results for input(s): LABOPIA, COCAINSCRNUR, LABBENZ, AMPHETMU, THCU, LABBARB in the last 168 hours.  Alcohol Level No results for input(s): ETH in the last 168 hours.  IMAGING past 24 hours CT HEAD WO CONTRAST  Result Date: 08/27/2020 CLINICAL DATA:  79 year old female with dizziness and blurred vision. EXAM: CT HEAD WITHOUT CONTRAST TECHNIQUE: Contiguous axial images were obtained from the base of the skull through the vertex without intravenous contrast. COMPARISON:  None. FINDINGS: Brain: Confluent abnormal hypodensity in the left cerebellar hemisphere, a  especially the superior cerebellar artery territory (coronal image 43 and series 3, image 13. No significant posterior fossa mass effect at this time. No superimposed No acute intracranial hemorrhage identified. No ventriculomegaly. Patchy bilateral cerebral white matter hypodensity, slightly greater in the left hemisphere. Probable dilated perivascular space right basal ganglia. No acute cortically based cerebral hemisphere infarct is identified. Vascular: Calcified atherosclerosis at the skull base. No suspicious intracranial vascular hyperdensity. Skull: Negative.  Hyperostosis, normal variant. Sinuses/Orbits: Visualized paranasal sinuses and mastoids are clear. Other: Postoperative changes to both globes. No acute orbit or scalp soft tissue finding. IMPRESSION: 1. Positive for acute to subacute Infarct of the Left Cerebellum, primarily the left SCA territory. No associated hemorrhage or mass effect at this time. 2. Supratentorial changes of chronic small vessel disease. No other acute intracranial abnormality. Electronically Signed   By: Genevie Ann M.D.   On: 08/27/2020 18:26   MR ANGIO HEAD WO CONTRAST  Result Date: 08/28/2020 CLINICAL DATA:  Stroke follow-up.  Dizziness and blurred vision EXAM: MRI HEAD WITHOUT CONTRAST MRA HEAD WITHOUT CONTRAST TECHNIQUE: Multiplanar, multiecho pulse sequences of the brain and surrounding  structures were obtained without intravenous contrast. Angiographic images of the head were obtained using MRA technique without contrast. COMPARISON:  Head CT from yesterday FINDINGS: MRI HEAD FINDINGS Brain: Confirmed acute infarct in the superior left cerebellum, superior cerebellar artery distribution. Small acute to subacute infarcts seen in the lateral right cerebellum and midbrain. Moderate area of acute infarction in the low right parietal cortex. Chronic small vessel ischemia in the cerebral white matter, moderately extensive. No evidence of hemorrhage, hydrocephalus, or mass. The  scan is progressively motion degraded, which could obscure findings. Vascular: Normal flow voids.  See below for MRA Skull and upper cervical spine: Normal marrow signal Sinuses/Orbits: Negative MRA HEAD FINDINGS Left dominant vertebral artery. Symmetric carotid artery size. Fetal type left PCA. Extensive atheromatous irregularity of large and medium size vessels. Motion artifact likely contributes to the degree of vessel irregularity. In the anterior circulation most notable is a moderate to advanced left M1 segment stenosis. In the posterior circulation there is extensive atheromatous irregularity of the basilar with prominent narrowing (over 50%) just before the SCA and PCA origin. Advanced left P1 2 junction and right P3, P4 stenoses. IMPRESSION: Brain MRI: 1. Acute infarcts in the superior left cerebellum and right parietal cortex. 2. Smaller infarcts seen in the right cerebellum and midbrain which are likely subacute. 3. Background of chronic small vessel ischemia. Intracranial MRA: Severe intracranial atherosclerosis with multifocal large and medium vessel stenosis as described above. Electronically Signed   By: Monte Fantasia M.D.   On: 08/28/2020 08:28   MR BRAIN WO CONTRAST  Result Date: 08/28/2020 CLINICAL DATA:  Stroke follow-up.  Dizziness and blurred vision EXAM: MRI HEAD WITHOUT CONTRAST MRA HEAD WITHOUT CONTRAST TECHNIQUE: Multiplanar, multiecho pulse sequences of the brain and surrounding structures were obtained without intravenous contrast. Angiographic images of the head were obtained using MRA technique without contrast. COMPARISON:  Head CT from yesterday FINDINGS: MRI HEAD FINDINGS Brain: Confirmed acute infarct in the superior left cerebellum, superior cerebellar artery distribution. Small acute to subacute infarcts seen in the lateral right cerebellum and midbrain. Moderate area of acute infarction in the low right parietal cortex. Chronic small vessel ischemia in the cerebral white  matter, moderately extensive. No evidence of hemorrhage, hydrocephalus, or mass. The scan is progressively motion degraded, which could obscure findings. Vascular: Normal flow voids.  See below for MRA Skull and upper cervical spine: Normal marrow signal Sinuses/Orbits: Negative MRA HEAD FINDINGS Left dominant vertebral artery. Symmetric carotid artery size. Fetal type left PCA. Extensive atheromatous irregularity of large and medium size vessels. Motion artifact likely contributes to the degree of vessel irregularity. In the anterior circulation most notable is a moderate to advanced left M1 segment stenosis. In the posterior circulation there is extensive atheromatous irregularity of the basilar with prominent narrowing (over 50%) just before the SCA and PCA origin. Advanced left P1 2 junction and right P3, P4 stenoses. IMPRESSION: Brain MRI: 1. Acute infarcts in the superior left cerebellum and right parietal cortex. 2. Smaller infarcts seen in the right cerebellum and midbrain which are likely subacute. 3. Background of chronic small vessel ischemia. Intracranial MRA: Severe intracranial atherosclerosis with multifocal large and medium vessel stenosis as described above. Electronically Signed   By: Monte Fantasia M.D.   On: 08/28/2020 08:28   VAS US CAROTID  Result Date: 08/28/2020 Carotid Arterial Duplex Study Indications:  TIA, CVA and Syncope. Risk Factors: Hypertension, hyperlipidemia. Performing Technologist: Griffin Basil  Examination Guidelines: A complete evaluation includes B-mode imaging, spectral Doppler, color  Doppler, and power Doppler as needed of all accessible portions of each vessel. Bilateral testing is considered an integral part of a complete examination. Limited examinations for reoccurring indications may be performed as noted.  Right Carotid Findings: +----------+--------+--------+--------+------------------+------------------+           PSV cm/sEDV cm/sStenosisPlaque  DescriptionComments           +----------+--------+--------+--------+------------------+------------------+ CCA Prox  159     25                                                   +----------+--------+--------+--------+------------------+------------------+ CCA Distal118     29                                intimal thickening +----------+--------+--------+--------+------------------+------------------+ ICA Prox  80      36      1-39%                     intimal thickening +----------+--------+--------+--------+------------------+------------------+ ICA Distal57      22                                                   +----------+--------+--------+--------+------------------+------------------+ ECA       100     14                                                   +----------+--------+--------+--------+------------------+------------------+ +----------+--------+-------+--------+-------------------+           PSV cm/sEDV cmsDescribeArm Pressure (mmHG) +----------+--------+-------+--------+-------------------+ [id number]     14                                 +----------+--------+-------+--------+-------------------+ +---------+--------+--+--------+--+----------+ VertebralPSV cm/s37EDV cm/s12Retrograde +---------+--------+--+--------+--+----------+  Left Carotid Findings: +----------+--------+--------+--------+------------------+------------------+           PSV cm/sEDV cm/sStenosisPlaque DescriptionComments           +----------+--------+--------+--------+------------------+------------------+ CCA Prox  102     27                                                   +----------+--------+--------+--------+------------------+------------------+ CCA Distal121     32                                intimal thickening +----------+--------+--------+--------+------------------+------------------+ ICA Prox  116     39      1-39%                      intimal thickening +----------+--------+--------+--------+------------------+------------------+ ICA Distal132     52                                                   +----------+--------+--------+--------+------------------+------------------+  ECA       106     19                                                   +----------+--------+--------+--------+------------------+------------------+ +----------+--------+--------+--------+-------------------+           PSV cm/sEDV cm/sDescribeArm Pressure (mmHG) +----------+--------+--------+--------+-------------------+ Subclavian190     5                                   +----------+--------+--------+--------+-------------------+ +---------+--------+--+--------+--+---------+ VertebralPSV cm/s70EDV cm/s31Antegrade +---------+--------+--+--------+--+---------+   Summary: Right Carotid: Velocities in the right ICA are consistent with a 1-39% stenosis. Left Carotid: Velocities in the left ICA are consistent with a 1-39% stenosis. Vertebrals: Left vertebral artery demonstrates antegrade flow. Right vertebral             artery demonstrates retrograde flow. *See table(s) above for measurements and observations.     Preliminary     PHYSICAL EXAM  Temp:  [97.5 F (36.4 C)-98.2 F (36.8 C)] 97.5 F (36.4 C) (09/20 1610) Pulse Rate:  [92-98] 98 (09/19 2332) Resp:  [16-20] 18 (09/20 1610) BP: (123-136)/(67-76) 125/67 (09/20 1610) SpO2:  [90 %-100 %] 90 % (09/20 1139) Weight:  [86.8 kg] 86.8 kg (09/20 0900)  General - Well nourished, well developed, in no apparent distress.  Ophthalmologic - fundi not visualized due to noncooperation.  Cardiovascular - Regular rhythm and rate, not in afib.  Mental Status -  Level of arousal and orientation to time, place, and person were intact. Language including expression, naming, repetition, comprehension was assessed and found intact.  Mild dysarthria  Cranial Nerves II - XII - II -  Visual field intact OU. III, IV, VI - Extraocular movements intact. V - Facial sensation intact bilaterally. VII - Facial movement intact bilaterally. VIII - Hearing & vestibular intact bilaterally. X - Palate elevates symmetrically.  Mild dysarthria XI - Chin turning & shoulder shrug intact bilaterally. XII - Tongue protrusion intact.  Motor Strength - The patient's strength was normal in all extremities and pronator drift was absent.  Bulk was normal and fasciculations were absent.   Motor Tone - Muscle tone was assessed at the neck and appendages and was normal.  Reflexes - The patient's reflexes were symmetrical in all extremities and she had no pathological reflexes.  Sensory - Light touch, temperature/pinprick were assessed and were symmetrical.    Coordination - The patient had grossly normal movements in the right hand with no ataxia or dysmetria.  However left finger-to-nose ataxic. Tremor was absent.  Gait and Station - deferred.   ASSESSMENT/PLAN Kristin [name] is a 79 y.o. female with history of diabetes mellitus, hypertension, hyperlipidemia presenting to ED with bilateral lower extremity weakness and low back pain and underwent bilateral L2-L4 laminectomy for severe lumbar stenosis on 08/19/2020.  Post op course complicated with sepsis/SIRS on broad-spectrum antibiotics, acute respiratory failure secondary to pulmonary edema, NSTEMI with elevated troponin of 27,000 on heparin drip as well as new onset atrial fibrillation. Early in the morning of 9/19 at 6 AM, rapid response was called as patient was profoundly weak compared to prior shift with new onset vomiting and no focal neurological deficits per rapid response nurse.  Later in the day, CT head was performed as patient complaining of dizziness  and blurred vision.  CT head shows acute left cerebellar stroke, fairly large in size.  Stroke: Left cerebellum and right parietal infarcts embolic secondary to new onset AF and  cardiomyopathy  CT head L cerebellar SCA territory infarct. Small vessel disease. Atrophy.   MRI  Acute superior L cerebellar and R parietal cortex infarcts. Subacute R cerebellar and midbrain infarcts. Small vessel disease.   MRA  Severe intracranial atherosclerosis  Carotid Doppler  B ICA 1-39% stenosis, VAs antegrade   2D Echo EF 30-35%. In AF. Moderate LVH.   LDL pending   HgbA1c 6.7  VTE prophylaxis - IV heparin  No antithrombotic prior to admission, now on aspirin 81 mg daily and heparin IV.  Patient tolerating well please anticoagulation, plan convert to Eliquis in 2 days per neurosurgery.  Therapy recommendations:  CIR  Disposition:  pending   Atrial Fibrillation, new onset  CHA2DS2-VASc Score = at least 8, ?2 oral anticoagulation recommended  Age in Years:  ?66   +2    Sex:  Female   Female   +1    Hypertension History:  yes   +1     Diabetes Mellitus:  yes   +1  Congestive Heart Failure History:  yes   +1  Vascular Disease History:  0     Stroke/TIA/Thromboembolism History:  yes   +2 . Currently on IV heparin, will transition to eliquis in 2 days per NSG   NSTEMI Cardiomyopathy  s/p cath w/ LAD occlusion.   EF 30-35%  On IV heparin.   Cath/stent on hold w/ elevated renal labs.  Hypertension  Stable . Long-term BP goal normotensive  Hyperlipidemia  Home meds:  crestor 20  Now on crestor 40  LDL pending, goal < 70  Continue statin at discharge  Diabetes type II Controlled  HgbA1c 6.7, goal < 7.0  CBGs  SSI  PCP follow up  Other Stroke Risk Factors  Advanced age  Obesity, Body mass index is 32.85 kg/m., recommend weight loss, diet and exercise as appropriate   Other Active Problems  AKI on CKD stage IV - Cre 1.79  SIRS w/o source found. tx w/ Vanc, now off. On IV cefepime.  Neck pain/ muscle spasm on robaxin  L2-L3 severe lumbar stenosis s/p B L2, L# and partial L4 laminectomy. HH PT  Acute hypoxemic respiratory failure d/t  pulmonary edema  B knee pain d/t pseudogout. Ortho onboard. Got steroid injection R knee. On Voltaren gel.  Hyperkalemia  Urinary retention w/ foley  Hospital day # 10  Rosalin Hawking, MD PhD Stroke Neurology 08/28/2020 6:32 PM    To contact Stroke Continuity provider, please refer to [email or web address] After hours, contact General Neurology

## 2020-08-28 NOTE — Progress Notes (Signed)
ANTICOAGULATION CONSULT NOTE   Pharmacy Consult for Heparin Indication: ACS/afib  Assessment: 79 yo female with new onset ACS. Pt also has new onset AFib. Patient has had a recent Neurosurgical procedure, therefore Neurosurgery is requesting no boluses and a tight APTT goal of 50-60.   Pt remains on IV heparin for now, aPTT at goal at 52 seconds. Pt noted to have acute CVA 9/19 pm, concern it was cardioembolic. Per neurology, continuing low dose heparin for now.    Goal of Therapy:  APTT 50-60 sec per neurosurgery Monitor platelets by anticoagulation protocol: Yes   Plan:  Continue heparin 850 units/h Daily aPTT    Arrie Senate, PharmD, BCPS Clinical Pharmacist [phone] Please check AMION for all [facility] numbers 08/28/2020

## 2020-08-29 LAB — GLUCOSE, CAPILLARY
Glucose-Capillary: 135 mg/dL — ABNORMAL HIGH (ref 70–99)
Glucose-Capillary: 127 mg/dL — ABNORMAL HIGH (ref 70–99)
Glucose-Capillary: 163 mg/dL — ABNORMAL HIGH (ref 70–99)
Glucose-Capillary: 118 mg/dL — ABNORMAL HIGH (ref 70–99)

## 2020-08-29 LAB — LIPID PANEL
Cholesterol: 130 mg/dL (ref 0–200)
HDL: 47 mg/dL (ref >40)
LDL Cholesterol: 65 mg/dL (ref 0–99)
Total CHOL/HDL Ratio: 2.8 RATIO
Triglycerides: 89 mg/dL (ref <150)
VLDL: 18 mg/dL (ref 0–40)

## 2020-08-29 LAB — BASIC METABOLIC PANEL
Anion gap: 17 — ABNORMAL HIGH (ref 5–15)
BUN: 33 mg/dL — ABNORMAL HIGH (ref 8–23)
CO2: 23 mmol/L (ref 22–32)
Calcium: 9.3 mg/dL (ref 8.9–10.3)
Chloride: 96 mmol/L — ABNORMAL LOW (ref 98–111)
Creatinine, Ser: 1.91 mg/dL — ABNORMAL HIGH (ref 0.44–1.00)
GFR calc Af Amer: 28 mL/min — ABNORMAL LOW (ref >60)
GFR calc non Af Amer: 24 mL/min — ABNORMAL LOW (ref >60)
Glucose, Bld: 126 mg/dL — ABNORMAL HIGH (ref 70–99)
Potassium: 4.7 mmol/L (ref 3.5–5.1)
Sodium: 136 mmol/L (ref 135–145)

## 2020-08-29 LAB — APTT: aPTT: 21 seconds — ABNORMAL LOW (ref 24–36)

## 2020-08-29 MED ORDER — METOPROLOL TARTRATE 50 MG PO TABS
50.0000 mg | ORAL_TABLET | Freq: Two times a day (BID) | ORAL | Status: DC
  Administered 2020-08-29 – 2020-08-30 (×2): 50 mg via ORAL
  Filled 2020-08-29 (×2): qty 1

## 2020-08-29 MED ORDER — BUMETANIDE 1 MG PO TABS
1.0000 mg | ORAL_TABLET | Freq: Two times a day (BID) | ORAL | Status: DC
  Administered 2020-08-29 – 2020-08-30 (×2): 1 mg via ORAL
  Filled 2020-08-29 (×4): qty 1

## 2020-08-29 MED ORDER — APIXABAN 2.5 MG PO TABS
2.5000 mg | ORAL_TABLET | Freq: Two times a day (BID) | ORAL | Status: DC
  Administered 2020-08-29 – 2020-08-30 (×3): 2.5 mg via ORAL
  Filled 2020-08-29 (×3): qty 1

## 2020-08-29 NOTE — Discharge Instructions (Signed)
Apixaban oral tablets What is this medicine? APIXABAN (a PIX a ban) is an anticoagulant (blood thinner). It is used to lower the chance of stroke in people with a medical condition called atrial fibrillation. It is also used to treat or prevent blood clots in the lungs or in the veins. This medicine may be used for other purposes; ask your health care provider or pharmacist if you have questions. COMMON BRAND NAME(S): Eliquis What should I tell my health care provider before I take this medicine? They need to know if you have any of these conditions:  antiphospholipid antibody syndrome  bleeding disorders  bleeding in the brain  blood in your stools (black or tarry stools) or if you have blood in your vomit  history of blood clots  history of stomach bleeding  kidney disease  liver disease  mechanical heart valve  an unusual or allergic reaction to apixaban, other medicines, foods, dyes, or preservatives  pregnant or trying to get pregnant  breast-feeding How should I use this medicine? Take this medicine by mouth with a glass of water. Follow the directions on the prescription label. You can take it with or without food. If it upsets your stomach, take it with food. Take your medicine at regular intervals. Do not take it more often than directed. Do not stop taking except on your doctor's advice. Stopping this medicine may increase your risk of a blood clot. Be sure to refill your prescription before you run out of medicine. Talk to your pediatrician regarding the use of this medicine in children. Special care may be needed. Overdosage: If you think you have taken too much of this medicine contact a poison control center or emergency room at once. NOTE: This medicine is only for you. Do not share this medicine with others. What if I miss a dose? If you miss a dose, take it as soon as you can. If it is almost time for your next dose, take only that dose. Do not take double or  extra doses. What may interact with this medicine? This medicine may interact with the following:  aspirin and aspirin-like medicines  certain medicines for fungal infections like ketoconazole and itraconazole  certain medicines for seizures like carbamazepine and phenytoin  certain medicines that treat or prevent blood clots like warfarin, enoxaparin, and dalteparin  clarithromycin  NSAIDs, medicines for pain and inflammation, like ibuprofen or naproxen  rifampin  ritonavir  St. John's wort This list may not describe all possible interactions. Give your health care provider a list of all the medicines, herbs, non-prescription drugs, or dietary supplements you use. Also tell them if you smoke, drink alcohol, or use illegal drugs. Some items may interact with your medicine. What should I watch for while using this medicine? Visit your healthcare professional for regular checks on your progress. You may need blood work done while you are taking this medicine. Your condition will be monitored carefully while you are receiving this medicine. It is important not to miss any appointments. Avoid sports and activities that might cause injury while you are using this medicine. Severe falls or injuries can cause unseen bleeding. Be careful when using sharp tools or knives. Consider using an electric razor. Take special care brushing or flossing your teeth. Report any injuries, bruising, or red spots on the skin to your healthcare professional. If you are going to need surgery or other procedure, tell your healthcare professional that you are taking this medicine. Wear a medical ID bracelet   or chain. Carry a card that describes your disease and details of your medicine and dosage times. What side effects may I notice from receiving this medicine? Side effects that you should report to your doctor or health care professional as soon as possible:  allergic reactions like skin rash, itching or hives,  swelling of the face, lips, or tongue  signs and symptoms of bleeding such as bloody or black, tarry stools; red or dark-brown urine; spitting up blood or brown material that looks like coffee grounds; red spots on the skin; unusual bruising or bleeding from the eye, gums, or nose  signs and symptoms of a blood clot such as chest pain; shortness of breath; pain, swelling, or warmth in the leg  signs and symptoms of a stroke such as changes in vision; confusion; trouble speaking or understanding; severe headaches; sudden numbness or weakness of the face, arm or leg; trouble walking; dizziness; loss of coordination This list may not describe all possible side effects. Call your doctor for medical advice about side effects. You may report side effects to FDA at 1-800-FDA-1088. Where should I keep my medicine? Keep out of the reach of children. Store at room temperature between 20 and 25 degrees C (68 and 77 degrees F). Throw away any unused medicine after the expiration date. NOTE: This sheet is a summary. It may not cover all possible information. If you have questions about this medicine, talk to your doctor, pharmacist, or health care provider.  2020 Elsevier/Gold Standard (2018-08-05 17:39:34)  

## 2020-08-29 NOTE — Progress Notes (Signed)
Physical Therapy Treatment Patient Details Name: Kristin [name] MRN: [id number] DOB: 1941/07/11 Today's Date: 08/29/2020    History of Present Illness Pt is an 79 y.o. female admitted 08/18/20 with worsening LBP and LE weakness. S/p lumbar laminectomy for decompression 9/11. On 9/14, pt with MI, new onset afib, required BiPAP. 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 PMH includes HTN, CKD, DM, chronic LBP.   PT Comments    Pt progressing with mobility. Today's session focused on transfer and gait training. Pt requires consistent modA to maintain stability with ambulation due to ataxia resulting in LOB while walking. Pt pleasant and motivated to participate. Noted daughter's plan for SNF instead of CIR. Continue to recommend intensive CIR-level therapies to maximize functional mobility and independence prior to return home.    Follow Up Recommendations  CIR;Supervision for mobility/OOB     Equipment Recommendations   (defer)    Recommendations for Other Services       Precautions / Restrictions Precautions Precautions: Back Precaution Comments: Pt able to recall 2/3 back precautions, cued for "no lifting" Restrictions Weight Bearing Restrictions: No    Mobility  Bed Mobility Overal bed mobility: Needs Assistance Bed Mobility: Rolling;Sidelying to Sit Rolling: Supervision Sidelying to sit: Supervision;HOB elevated          Transfers Overall transfer level: Needs assistance Equipment used: None;Rolling walker (2 wheeled) Transfers: Sit to/from Stand Sit to Stand: Mod assist;Min assist         General transfer comment: Performed multiple sit<>stands throughout session from bed, BSC and recliner, repeated cues for sequencing; up modA to maintain stability with heavy reliance on UE support when standing without DME; minA to  maintain stability with RW  Ambulation/Gait Ambulation/Gait assistance: Mod assist Gait Distance (Feet): 36 Feet Assistive device: 1 person hand held assist;Rolling walker (2 wheeled) Gait Pattern/deviations: Step-through pattern;Wide base of support;Ataxic Gait velocity: Decreased   General Gait Details: Very unsteady, ataxic gait, reliant on HHA and consistent modA to maintain stability ambulating 36' in room, seated rest secondary to fatigue; stability improved with RW, but pt still requires frequent assist due to multiple bouts of LOB   Stairs             Wheelchair Mobility    Modified Rankin (Stroke Patients Only)       Balance Overall balance assessment: Needs assistance   Sitting balance-Leahy Scale: Fair Sitting balance - Comments: Able to adjust bilateral socks sitting in figure four technique without back support; mild ataxic trunk movements noted   Standing balance support: During functional activity Standing balance-Leahy Scale: Poor Standing balance comment: Reliant on single UE support or external assist               High Level Balance Comments: Standing toe taps 5x each side (ataxic LLE > RLE), reliant on HHA and modA to maintain stability            Cognition Arousal/Alertness: Awake/alert Behavior During Therapy: WFL for tasks assessed/performed Overall Cognitive Status: No family/caregiver present to determine baseline cognitive functioning Area of Impairment: Attention;Following commands;Safety/judgement;Awareness;Problem solving                   Current Attention Level: Selective   Following Commands: Follows one step commands with increased time;Follows multi-step commands inconsistently Safety/Judgement: Decreased awareness of safety;Decreased awareness of deficits Awareness: Emergent Problem Solving: Slow  processing;Decreased initiation;Difficulty sequencing;Requires verbal cues;Requires tactile cues General Comments:  Pleasant and motivated to participate. Able to relate her difficulty walking and discoordination to the fact she had stroke. Requires frequent cues for sequencing and reminders of task      Exercises      General Comments General comments (skin integrity, edema, etc.): SpO2 96% RA, HR 80s-90s, BP 106/61. Continues to demonstrate dysmetria (L > R) and dysdiadochokinesia      Pertinent Vitals/Pain Pain Assessment: Faces Faces Pain Scale: Hurts a little bit Pain Location: L knee > lower back Pain Descriptors / Indicators: Guarding Pain Intervention(s): Monitored during session    Home Living                      Prior Function            PT Goals (current goals can now be found in the care plan section) Acute Rehab PT Goals Patient Stated Goal: to get stonger Progress towards PT goals: Progressing toward goals    Frequency    Min 4X/week      PT Plan Frequency needs to be updated    Co-evaluation              AM-PAC PT "6 Clicks" Mobility   Outcome Measure  Help needed turning from your back to your side while in a flat bed without using bedrails?: A Little Help needed moving from lying on your back to sitting on the side of a flat bed without using bedrails?: A Little Help needed moving to and from a bed to a chair (including a wheelchair)?: A Little Help needed standing up from a chair using your arms (e.g., wheelchair or bedside chair)?: A Lot Help needed to walk in hospital room?: A Lot Help needed climbing 3-5 steps with a railing? : A Lot 6 Click Score: 15    End of Session Equipment Utilized During Treatment: Gait belt Activity Tolerance: Patient tolerated treatment well Patient left: in chair;with call bell/phone within reach;with nursing/sitter in room Nurse Communication: Mobility status PT Visit Diagnosis: Unsteadiness on feet (R26.81);Other abnormalities of gait and mobility (R26.89);Ataxic gait (R26.0);Difficulty in walking, not  elsewhere classified (R26.2) Pain - Right/Left: Right Pain - part of body: Knee     Time: 1325-1350 PT Time Calculation (min) (ACUTE ONLY): 25 min  Charges:  $Gait Training: 8-22 mins $Therapeutic Activity: 8-22 mins                    Mabeline Caras, PT, DPT Acute Rehabilitation Services  Pager [phone] Office [place] 08/29/2020, 3:42 PM

## 2020-08-29 NOTE — Progress Notes (Addendum)
ANTICOAGULATION CONSULT NOTE   Pharmacy Consult for Heparin Indication: ACS/afib  Assessment: 79 yo female with new onset ACS. Pt also has new onset AFib. Patient has had a recent Neurosurgical procedure, therefore Neurosurgery is requesting no boluses and a tight APTT goal of 50-60.   Pt noted to have acute CVA 9/19 pm, concern it was cardioembolic. Per neurology and neurosurgery, ok continuing low dose heparin for now, planning to transition to [place] soon.  ADDENDUM: Will begin apixaban today. Pt is 79 y/o and SCr >1.5, therefore will utilize 2.5mg  BID dosing.    Goal of Therapy:  APTT 50-60 sec per neurosurgery Monitor platelets by anticoagulation protocol: Yes   Plan:  Stop heparin Begin apixaban 2.5mg  BID   Arrie Senate, PharmD, BCPS Clinical Pharmacist [phone] Please check AMION for all [place] numbers 08/29/2020

## 2020-08-29 NOTE — Progress Notes (Addendum)
[facility]  PROGRESS NOTE  Kristin [name] FUX:[id number] DOB: [birth year] DOA: 08/18/2020 PCP: Caren Macadam, MD   Brief HPI:   79 year old female with history of chronic back pain, hypertension, diabetes mellitus type 2 came to ED with complaints of bilateral lower extremity weakness and worsening lower back pain.  Patient had an MRI on 04/13/2020 which showed advanced lumbar facet arthrosis with multilevel degenerative anterolisthesis, severe spinal stenosis at L2-3 and L3-4.  Mild to moderate spinal stenosis at L4-5.  MRI ordered during this admission showed similar changes.  Neurosurgery was consulted and surgical intervention was recommended for possible phlegmon at L3-4.  No evidence of infection was found during lumbar laminectomy.  Patient has been receiving IV antibiotics.  Patient developed acute hypoxemic respiratory failure on 08/22/2020, likely from pulmonary edema.  Cardiology was consulted.  Patient was diagnosed with NSTEMI, underwent diagnostic cardiac cath.  Then developed acute kidney injury.  Due to high bleeding risk aspirin and Eliquis were held as per neurosurgery.  Patient complained of dizziness and was diagnosed with left cerebellar stroke.  She has been started back on aspirin and Eliquis after neurosurgery clearance.  Cardiology does not plan any PCI at this time.  Patient to follow-up with cardiology as outpatient    Subjective   Patient seen and examined, denies any complaints.   Assessment/Plan:     1. SIRS-patient presents with fever, tachycardia, tachypnea.  No clear source of infection.  No evidence of infection of lumbar spine or knee joint infection.  Patient was started on vancomycin and cefepime, vancomycin has been discontinued as of 08/21/2020.  Continue with IV cefepime at this time.  No evidence found during laminectomy and decompression.  Blood culture showed no growth.  Urine culture showed insignificant growth.  Synovial fluid right knee showed no  growth.  CT abdomen was negative for infection.    2. Left cerebellar stroke-patient developed dizziness yesterday, CT head obtained showed large left cerebellar stroke.  Neurology was consulted.  MRI brain obtained showed acute infarct in superior left cerebellum and right parietal cortex.  Small infarcts in right cerebellum and midbrain likely subacute.  Patient  is now on aspirin and Eliquis.  Continue rosuvastatin  3. Neck pain/muscle spasm-patient already has Robaxin as needed ordered.  4. L2 -L4 severe lumbar stenosis-patient presented with lower extremity pain and numbness.  Underwent bilateral L2, L3, partial L4 laminectomy for decompression of neural elements on 08/19/2020.  No evidence of infection was found.  PT evaluation recommended CIR. Patient is currently refusing to go to CIR.  Plan to go to skilled nursing facility for rehab.    5. Acute hypoxemic respiratory failure-secondary pulmonary edema.  BNP was elevated.  Cardiology was consulted.  Patient was started on IV Lasix.  Lasix has been changed to Bumex.  6. NSTEMI-troponin was elevated at 27,000, patient started on low-dose heparin with PTT goal 50-60.  No bolus.  Patient was started on heparin, IV nitroglycerin, statin.  I will add glycerin was discontinued.    Patient underwent diagnostic catheterization  which showed proximal LAD occluded, LVEF 35% of the entire anterior, apical and apical inferior akinesis.  Neurosurgery recommended stent/dual antiplatelet therapy to be held until postop 9-day or 10 of her intervention.  Patient has been started on aspirin 81 mg p.o. daily.   Neurosurgery has cleared patient to get Eliquis.  Eliquis has been started.  Cardiology plans for cardiac cath with PCI in 2 to 3 weeks.  Cardiac cath has been canceled during this  admission due to worsening renal function.  7. Atrial fibrillation, new onset-patient currently on IV heparin.   CHA2DS2VASc score is 5, continue anticoagulation with IV heparin.   Plan to switch to Eliquis when okay with neurology.  Continue amiodarone 200 mg p.o. daily  8. Bilateral knee pain-pseudogout.  Patient underwent arthrocentesis of right knee.  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  Orthopedics following.  Patient underwent injection of Depo-Medrol into the right knee.  Continue Voltaren gel.  9. Hypertension-continue Coreg, clonidine patch, as needed hydralazine.  10. Hyperkalemia-resolved.  Hold potassium supplementation.  11. Urine retention-Foley catheter in place.  12. Acute kidney injury-creatinine is elevated at 1.91.  Likely from diuresis.  Follow BMP in am      COVID-19 Labs  No results for input(s): DDIMER, FERRITIN, LDH, CRP in the last 72 hours.  Lab Results  Component Value Date   [place] NEGATIVE 08/18/2020     Scheduled medications:   . amiodarone  200 mg Oral Daily  . apixaban  2.5 mg Oral BID  . aspirin  81 mg Oral Daily  . bumetanide  1 mg Oral BID  . cholecalciferol  3,000 Units Oral Daily  . colchicine  0.3 mg Oral Daily  . diclofenac Sodium  2 g Topical QID  . isosorbide-hydrALAZINE  2 tablet Oral TID  . metoprolol tartrate  50 mg Oral BID  . pantoprazole  40 mg Oral BID  . polyethylene glycol  17 g Oral BID  . rosuvastatin  40 mg Oral QHS  . senna-docusate  1 tablet Oral BID  . sodium chloride flush  10-40 mL Intracatheter Q12H  . sucralfate  1 g Oral TID WC & HS         CBG: Recent Labs  Lab 08/28/20 1146 08/28/20 1709 08/28/20 2110 08/29/20 0817 08/29/20 1156  GLUCAP 153* 107* 132* 135* 127*    SpO2: 96 % O2 Flow Rate (L/min): 4 L/min FiO2 (%): 80 %    CBC: Recent Labs  Lab 08/24/20 1205 08/25/20 0637 08/26/20 0505 08/27/20 0818 08/28/20 0211  WBC 11.2* 11.6* 11.9* 12.3* 10.7*  HGB 9.4* 9.2* 8.4* 8.9* 8.7*  HCT 29.3* 28.7* 26.2* 27.8* 27.9*  MCV 89.1 90.8 90.3 93.9 93.9  PLT 305 327 344 328 409    Basic Metabolic Panel: Recent Labs  Lab 08/22/20 1759  08/23/20 0056 08/25/20 0637 08/26/20 0505 08/27/20 0818 08/28/20 0211 08/29/20 0250  NA 130*   < > 134* 139 137 136 136  K 3.5   < > 4.1 3.9 6.2* 4.3 4.7  CL 94*   < > 97* 102 101 98 96*  CO2 21*   < > 29 29 28 27 23   GLUCOSE 178*   < > 144* 132* 133* 137* 126*  BUN 34*   < > 36* 38* 34* 29* 33*  CREATININE 1.13*   < > 1.83* 1.87* 1.68* 1.79* 1.91*  CALCIUM 9.0   < > 8.5* 8.7* 9.2 9.3 9.3  MG 2.0  --   --   --   --   --   --    < > = values in this interval not displayed.     Liver Function Tests: Recent Labs  Lab 08/22/20 1759 08/26/20 2304  AST 190* 50*  ALT 66* 45*  ALKPHOS 79 53  BILITOT 0.5 0.9  PROT 7.2 6.5  ALBUMIN 2.8* 2.5*     Antibiotics: Anti-infectives (From admission, onward)   Start     Dose/Rate Route  Frequency Ordered Stop   08/20/20 0400  vancomycin (VANCOREADY) IVPB 750 mg/150 mL  Status:  Discontinued        750 mg 150 mL/hr over 60 Minutes Intravenous Every 12 hours 08/19/20 1005 08/21/20 1101   08/19/20 1030  ceFEPIme (MAXIPIME) 2 g in sodium chloride 0.9 % 100 mL IVPB        2 g 200 mL/hr over 30 Minutes Intravenous Every 12 hours 08/19/20 1005 08/24/20 1900   08/19/20 1030  vancomycin (VANCOREADY) IVPB 1750 mg/350 mL        1,750 mg 175 mL/hr over 120 Minutes Intravenous  Once 08/19/20 1005 08/19/20 1907   08/19/20 1030  ceFAZolin (ANCEF) IVPB 2g/100 mL premix        2 g 200 mL/hr over 30 Minutes Intravenous  Once 08/19/20 1019 08/19/20 1120   08/19/20 1021  ceFAZolin (ANCEF) 2-4 GM/100ML-% IVPB       Note to Pharmacy: Henrine Screws   : cabinet override      08/19/20 1021 08/19/20 1124   08/18/20 0515  ceFEPIme (MAXIPIME) 2 g in sodium chloride 0.9 % 100 mL IVPB        2 g 200 mL/hr over 30 Minutes Intravenous  Once 08/18/20 0504 08/18/20 0612   08/18/20 0515  metroNIDAZOLE (FLAGYL) IVPB 500 mg        500 mg 100 mL/hr over 60 Minutes Intravenous  Once 08/18/20 0504 08/18/20 0705   08/18/20 0515  vancomycin (VANCOCIN) IVPB 1000 mg/200 mL  premix  Status:  Discontinued        1,000 mg 200 mL/hr over 60 Minutes Intravenous  Once 08/18/20 0504 08/18/20 0507   08/18/20 0515  vancomycin (VANCOREADY) IVPB 1750 mg/350 mL        1,750 mg 175 mL/hr over 120 Minutes Intravenous  Once 08/18/20 0507 08/18/20 1958       DVT prophylaxis: SCDs  Code Status: Full code  Family Communication: No family at bedside    Status is: Inpatient  Dispo: The patient is from: Home              Anticipated d/c is to: Skilled nursing facility              Anticipated d/c date is: 08/31/2020              Patient currently not medically stable for discharge  Barrier to discharge-ongoing treatment for acute hypoxemic respiratory failure, pulmonary edema, CVA     Consultants:  Neurosurgery  Orthopedics   Objective   Vitals:   08/28/20 1957 08/29/20 0600 08/29/20 0820 08/29/20 1159  BP: (!) 124/97 127/81  111/64  Pulse: 88 (!) 101  86  Resp: 18 19 19    Temp: 98.2 F (36.8 C) 98.2 F (36.8 C) 98.7 F (37.1 C) 98.2 F (36.8 C)  TempSrc: Oral Oral Oral Oral  SpO2: 97% 97%  96%  Weight:  85.2 kg    Height:        Intake/Output Summary (Last 24 hours) at 08/29/2020 1351 Last data filed at 08/29/2020 1610 Gross per 24 hour  Intake 356.5 ml  Output 500 ml  Net -143.5 ml    09/19 1901 - 09/21 0700 In: 876.4 [P.O.:640; I.V.:236.4] Out: 1850 [Urine:1850]  Filed Weights   08/27/20 0550 08/28/20 0900 08/29/20 0600  Weight: 87.1 kg 86.8 kg 85.2 kg    Physical Examination:  General-appears in no acute distress Heart-S1-S2, regular, no murmur auscultated Lungs-clear to auscultation bilaterally, no wheezing or  crackles auscultated Abdomen-soft, nontender, no organomegaly Extremities-no edema in the lower extremities Neuro-alert, oriented x3, no focal deficit noted  Data Reviewed:   Recent Results (from the past 240 hour(s))  Body fluid culture     Status: None   Collection Time: 08/19/20  2:50 PM   Specimen: Body Fluid   Result Value Ref Range Status   Specimen Description FLUID  Final   Special Requests SYNOVIAL RIGHT KNEE  Final   Gram Stain   Final    MODERATE WBC PRESENT, PREDOMINANTLY PMN NO ORGANISMS SEEN    Culture   Final    NO GROWTH 3 DAYS Performed at [facility], [address] [place], [place] [address]    Report Status 08/23/2020 FINAL  Final    Recent Labs  Lab 08/26/20 2304  LIPASE 27   No results for input(s): AMMONIA in the last 168 hours.  Cardiac Enzymes: No results for input(s): CKTOTAL, CKMB, CKMBINDEX, TROPONINI in the last 168 hours. BNP (last 3 results) Recent Labs    08/25/20 0637 08/26/20 0505 08/27/20 0818  BNP 517.4* 675.7* 424.0*     [place]   [facility] If 7PM-7AM, please contact night-coverage at [email or web address], Office  [phone]   08/29/2020, 1:51 PM  LOS: 11 days

## 2020-08-29 NOTE — Progress Notes (Signed)
Occupational Therapy Treatment Patient Details Name: Kristin [name] MRN: [id number] DOB: 1940-12-12 Today's Date: 08/29/2020    History of present illness This is an 79 year old female with a history of HTN, CKD, DM, chronic low back pain that presents with worsening low back pain and lower extremity weakness. Now s/p Lumbar Laminectomy for decompression; Also with knee pain, Ortho is addressing, considering injections. 08/22/20 developed acute decompensated systolic heart failure and pulmonary edema needing BiPAP. Brain MRI 9/20-Acute infarcts in the superior left cerebellum and right parietal cortex.Smaller infarcts in right cerebellum and midbrain whichare likely subacute.   OT comments  Pt received sitting up in bed, HOB elevated, awake and alert agreeable to session. Reports having low energy and feeling light headed, BP checked at 110/61 prior to any movement. Once at EOB 122/61, less c/o feeling light headed. Pt able to state 2/3 back precautions for safety with AE, Supervision and set up grooming at EOB, Min guard BSC transfer with RW (VC's hand placement). Pt is progressing toward OT and will benefit from continued acute OT to address established deficits to maximize independence prior to dc to [place]. DC and Freq remains the same.    Follow Up Recommendations  Home health OT    Equipment Recommendations  3 in 1 bedside commode    Recommendations for Other Services      Precautions / Restrictions Precautions Precautions: Back Precaution Booklet Issued: No Precaution Comments: educated on position change needed for back precautions       Mobility Bed Mobility Overal bed mobility: Needs Assistance Bed Mobility: Supine to Sit     Supine to sit: HOB elevated;Min guard     General bed mobility comments: good sequencing of rolling toward L side and present BLE to EOB with use of Bed rail to elevate trunk.   Transfers Overall transfer level: Needs assistance Equipment used:  None Transfers: Sit to/from Stand Sit to Stand: Min assist         General transfer comment: no physical assistance to power up to stand, however, min a for safety and cueing.    Balance Overall balance assessment: Needs assistance Sitting-balance support: Feet supported;Single extremity supported Sitting balance-Leahy Scale: Fair Sitting balance - Comments: Some mild ataxic movements through trunk; educated on gaze stabilization.   Standing balance support: During functional activity Standing balance-Leahy Scale: Poor Standing balance comment: Requires external support. Ataxia worse in standing.                           ADL either performed or assessed with clinical judgement   ADL Overall ADL's : Needs assistance/impaired     Grooming: Supervision/safety;Set up;Sitting Grooming Details (indicate cue type and reason): oral care and washing face at EOB due to c/o light headedness in sitting prior to standing, BP value 110/61 and 122/61             Lower Body Dressing: Minimal assistance Lower Body Dressing Details (indicate cue type and reason): safety to prevent bending forward instructed on figure 4 position to limit bending of back.  Toilet Transfer: Min guard;Ambulation;BSC;RW Toilet Transfer Details (indicate cue type and reason): stand pivot transfer to [facility] with RW, VC's for hand placement and sequencing due to some ataxic movement.  Toileting- Clothing Manipulation and Hygiene: Supervision/safety;Sitting/lateral lean       Functional mobility during ADLs: Min guard;Rolling walker;Cueing for safety;Cueing for sequencing General ADL Comments: Pt instructed to state 3/3 back precautions, able to state 2/3.  Vision       Perception     Praxis      Cognition Arousal/Alertness: Awake/alert Behavior During Therapy: WFL for tasks assessed/performed Overall Cognitive Status: No family/caregiver present to determine baseline cognitive  functioning Area of Impairment: Problem solving;Safety/judgement;Following commands                       Following Commands: Follows one step commands with increased time Safety/Judgement: Decreased awareness of safety;Decreased awareness of deficits   Problem Solving: Slow processing;Decreased initiation;Difficulty sequencing;Requires verbal cues;Requires tactile cues General Comments: Not able to state she had a stroke. Follows commands with increased time/repetition. Pt is sitting in bed that is saturated in wet linens and gown.        Exercises     Shoulder Instructions       General Comments Pt on RA once received, SpO2 of 96-97%    Pertinent Vitals/ Pain       Pain Assessment: Faces Faces Pain Scale: Hurts a little bit Pain Location: R knee and back initially, pt reports decrease in pain following mobility Pain Descriptors / Indicators: Grimacing;Guarding;Aching;Discomfort Pain Intervention(s): Monitored during session;Repositioned  Home Living                                          Prior Functioning/Environment              Frequency  Min 2X/week        Progress Toward Goals  OT Goals(current goals can now be found in the care plan section)  Progress towards OT goals: Progressing toward goals  Acute Rehab OT Goals Patient Stated Goal: to get stonger OT Goal Formulation: With patient Time For Goal Achievement: 09/03/20 Potential to Achieve Goals: Good ADL Goals Pt Will Perform Upper Body Dressing: with modified independence;sitting Pt Will Perform Lower Body Dressing: with modified independence;sit to/from stand;with adaptive equipment Pt Will Transfer to Toilet: with modified independence;ambulating;bedside commode Pt Will Perform Tub/Shower Transfer: Shower transfer;with supervision;rolling walker;ambulating Additional ADL Goal #1: pt will complete bed mobility with 100 back precautions as precursors for adls  Plan  Discharge plan remains appropriate    Co-evaluation                 AM-PAC OT "6 Clicks" Daily Activity     Outcome Measure   Help from another person eating meals?: None Help from another person taking care of personal grooming?: A Little Help from another person toileting, which includes using toliet, bedpan, or urinal?: A Little Help from another person bathing (including washing, rinsing, drying)?: A Little Help from another person to put on and taking off regular upper body clothing?: A Little Help from another person to put on and taking off regular lower body clothing?: A Little 6 Click Score: 19    End of Session Equipment Utilized During Treatment: Rolling walker;Gait belt;Oxygen  OT Visit Diagnosis: Unsteadiness on feet (R26.81);Muscle weakness (generalized) (M62.81);Pain Pain - Right/Left: Right Pain - part of body: Leg   Activity Tolerance Patient tolerated treatment well   Patient Left with call bell/phone within reach;in bed;with bed alarm set   Nurse Communication Mobility status;Precautions        Time: 1194-1740 OT Time Calculation (min): 43 min  Charges: OT General Charges $OT Visit: 1 Visit OT Treatments $Self Care/Home Management : 38-52 mins  Minus Breeding, MSOT, OTR/L  Supplemental  Rehabilitation Services  [phone]    Marius Ditch 08/29/2020, 9:39 AM

## 2020-08-29 NOTE — Progress Notes (Signed)
STROKE TEAM PROGRESS NOTE   INTERVAL HISTORY RN at bedside.  Patient sitting in bed, awake alert, denies headache, nausea vomiting or dizziness.  Left finger-to-nose ataxia improved, still has left heel-to-shin ataxia.  Vitals:   08/28/20 1610 08/28/20 1957 08/29/20 0600 08/29/20 0820  BP: 125/67 (!) 124/97 127/81   Pulse:  88 (!) 101   Resp: 18 18 19 19   Temp: (!) 97.5 F (36.4 C) 98.2 F (36.8 C) 98.2 F (36.8 C) 98.7 F (37.1 C)  TempSrc: Oral Oral Oral Oral  SpO2:  97% 97%   Weight:   85.2 kg   Height:       CBC:  Recent Labs  Lab 08/27/20 0818 08/28/20 0211  WBC 12.3* 10.7*  HGB 8.9* 8.7*  HCT 27.8* 27.9*  MCV 93.9 93.9  PLT 328 761   Basic Metabolic Panel:  Recent Labs  Lab 08/22/20 1759 08/23/20 0056 08/28/20 0211 08/29/20 0250  NA 130*   < > 136 136  K 3.5   < > 4.3 4.7  CL 94*   < > 98 96*  CO2 21*   < > 27 23  GLUCOSE 178*   < > 137* 126*  BUN 34*   < > 29* 33*  CREATININE 1.13*   < > 1.79* 1.91*  CALCIUM 9.0   < > 9.3 9.3  MG 2.0  --   --   --    < > = values in this interval not displayed.   Lipid Panel:  Recent Labs  Lab 08/29/20 0250  CHOL 130  TRIG 89  HDL 47  CHOLHDL 2.8  VLDL 18  LDLCALC 65   HgbA1c:  Recent Labs  Lab 08/27/20 0818  HGBA1C 6.7*   Urine Drug Screen: No results for input(s): LABOPIA, COCAINSCRNUR, LABBENZ, AMPHETMU, THCU, LABBARB in the last 168 hours.  Alcohol Level No results for input(s): ETH in the last 168 hours.  IMAGING past 24 hours No results found.  PHYSICAL EXAM    Temp:  [97.5 F (36.4 C)-98.7 F (37.1 C)] 98.7 F (37.1 C) (09/21 0820) Pulse Rate:  [88-101] 101 (09/21 0600) Resp:  [16-19] 19 (09/21 0820) BP: (123-127)/(67-97) 127/81 (09/21 0600) SpO2:  [90 %-97 %] 97 % (09/21 0600) Weight:  [85.2 kg] 85.2 kg (09/21 0600)  General - Well nourished, well developed, in no apparent distress.  Ophthalmologic - fundi not visualized due to noncooperation.  Cardiovascular - Regular rhythm and  rate, not in afib.  Mental Status -  Level of arousal and orientation to time, place, and person were intact. Language including expression, naming, repetition, comprehension was assessed and found intact.  Mild dysarthria  Cranial Nerves II - XII - II - Visual field intact OU. III, IV, VI - Extraocular movements intact. V - Facial sensation intact bilaterally. VII - Facial movement intact bilaterally. VIII - Hearing & vestibular intact bilaterally. X - Palate elevates symmetrically.  Mild dysarthria XI - Chin turning & shoulder shrug intact bilaterally. XII - Tongue protrusion intact.  Motor Strength - The patient's strength was normal in all extremities and pronator drift was absent.  Bulk was normal and fasciculations were absent.   Motor Tone - Muscle tone was assessed at the neck and appendages and was normal.  Reflexes - The patient's reflexes were symmetrical in all extremities and she had no pathological reflexes.  Sensory - Light touch, temperature/pinprick were assessed and were symmetrical.    Coordination - The patient had grossly normal movements in the  right hand and feet with no ataxia or dysmetria.  Left finger-to-nose ataxic improved, left heel-to-shin ataxia continued. Tremor was absent.  Gait and Station - deferred.   ASSESSMENT/PLAN [name] is a 79 y.o. female with history of diabetes mellitus, hypertension, hyperlipidemia presenting to ED with bilateral lower extremity weakness and low back pain and underwent bilateral L2-L4 laminectomy for severe lumbar stenosis on 08/19/2020.  Post op course complicated with sepsis/SIRS on broad-spectrum antibiotics, acute respiratory failure secondary to pulmonary edema, NSTEMI with elevated troponin of 27,000 on heparin drip as well as new onset atrial fibrillation. Early in the morning of 9/19 at 6 AM, rapid response was called as patient was profoundly weak compared to prior shift with new onset vomiting and no  focal neurological deficits per rapid response nurse.  Later in the day, CT head was performed as patient complaining of dizziness and blurred vision.  CT head shows acute left cerebellar stroke, fairly large in size.  Stroke: Left cerebellum and right parietal infarcts embolic secondary to new onset AF and cardiomyopathy  CT head L cerebellar SCA territory infarct. Small vessel disease. Atrophy.   MRI  Acute superior L cerebellar and R parietal cortex infarcts. Subacute R cerebellar and midbrain infarcts. Small vessel disease.   MRA  Severe intracranial atherosclerosis  Carotid Doppler  B ICA 1-39% stenosis, VAs antegrade   2D Echo EF 30-35%. In AF. Moderate LVH.   LDL 65  HgbA1c 6.7  VTE prophylaxis - IV heparin  No antithrombotic prior to admission, was on aspirin 81 mg daily and heparin IV.  No heparin IV convert to Eliquis.    Therapy recommendations:  CIR-> dtr prefers SNF [facility] for longer stay. OT recommends HH   Disposition:  pending   Atrial Fibrillation, new onset  CHA2DS2-VASc Score = at least 8, ?2 oral anticoagulation recommended  Age in Years:  ?51   +2    Sex:  Female   Female   +1    Hypertension History:  yes   +1     Diabetes Mellitus:  yes   +1  Congestive Heart Failure History:  yes   +1  Vascular Disease History:  0     Stroke/TIA/Thromboembolism History:  yes   +2 . Hyper IV converted to Eliquis today.   NSTEMI Cardiomyopathy  s/p cath w/ LAD occlusion.   EF 30-35%  IV heparin->Eliquis todsay  Cath/stent on hold w/ elevated renal labs.  Hypertension  Stable . Long-term BP goal normotensive  Hyperlipidemia  Home meds:  crestor 20  Now on crestor 40  LDL 65, goal < 70  Continue statin at discharge  Diabetes type II Controlled  HgbA1c 6.7, goal < 7.0  CBGs  SSI  PCP follow up  Other Stroke Risk Factors  Advanced age  Obesity, Body mass index is 32.24 kg/m., recommend weight loss, diet and exercise as appropriate    Other Active Problems  AKI on CKD stage IV - Cre 1.79->1.91  SIRS w/o source found. tx w/ Vanc, now off. On IV cefepime.  Neck pain/ muscle spasm on robaxin  L2-L3 severe lumbar stenosis s/p B L2, L# and partial L4 laminectomy. HH PT  Acute hypoxemic respiratory failure d/t pulmonary edema  B knee pain d/t pseudogout. Ortho onboard. Got steroid injection R knee. On Voltaren gel.  Hyperkalemia  Urinary retention w/ foley  Hospital day # 11  Neurology will sign off. Please call with questions. Pt will follow up with stroke clinic NP at  [facility] in about 4 weeks. Thanks for the consult.   Rosalin Hawking, MD PhD Stroke Neurology 08/29/2020 10:15 AM    To contact Stroke Continuity provider, please refer to [email or web address] After hours, contact General Neurology

## 2020-08-29 NOTE — TOC Benefit Eligibility Note (Signed)
Transition of Care [facility]) Benefit Eligibility Note    Patient Details  Name: Zakaria Fromer MRN: [id number] Date of Birth: [birth year]   Medication/Dose: Arne Cleveland  5 MG BID  Covered?: Yes  Tier: 3 Drug  Prescription Coverage Preferred Pharmacy: [facility] [place] with Person/Company/Phone Number:: [facility]  @ [address] [place] [facility] # [phone]  Co-Pay: $9.20  Prior Approval: No  Deductible:  (LOWE INCOME SUBSIDY)       Memory Argue Phone Number: 08/29/2020, 5:07 PM

## 2020-08-29 NOTE — Progress Notes (Signed)
Subjective:  She is presently doing well denies dyspnea or chest pain. Events regarding CVA noted.   Intake/Output from previous day:  I/O last 3 completed shifts: In: 876.4 [P.O.:640; I.V.:236.4] Out: 1850 [Urine:1850] No intake/output data recorded.  Blood pressure 127/81, pulse (!) 101, temperature 98.7 F (37.1 C), temperature source Oral, resp. rate 19, height 5' 4"  (1.626 m), weight 85.2 kg, SpO2 97 %. Physical Exam Cardiovascular:     Rate and Rhythm: Normal rate and regular rhythm.     Pulses: Intact distal pulses.     Heart sounds: Normal heart sounds. No murmur heard.  No gallop.      Comments: No leg edema, no JVD. Pulmonary:     Effort: Pulmonary effort is normal.     Breath sounds: Rales (bibasilar) present.  Abdominal:     General: Bowel sounds are normal.     Palpations: Abdomen is soft.    Lab Results: BMP BNP (last 3 results) Recent Labs    08/25/20 0637 08/26/20 0505 08/27/20 0818  BNP 517.4* 675.7* 424.0*    ProBNP (last 3 results) No results for input(s): PROBNP in the last 8760 hours. BMP Latest Ref Rng & Units 08/29/2020 08/28/2020 08/27/2020  Glucose 70 - 99 mg/dL 126(H) 137(H) 133(H)  BUN 8 - 23 mg/dL 33(H) 29(H) 34(H)  Creatinine 0.44 - 1.00 mg/dL 1.91(H) 1.79(H) 1.68(H)  BUN/Creat Ratio 12 - 28 - - -  Sodium 135 - 145 mmol/L 136 136 137  Potassium 3.5 - 5.1 mmol/L 4.7 4.3 6.2(H)  Chloride 98 - 111 mmol/L 96(L) 98 101  CO2 22 - 32 mmol/L 23 27 28   Calcium 8.9 - 10.3 mg/dL 9.3 9.3 9.2   Hepatic Function Latest Ref Rng & Units 08/26/2020 08/22/2020 08/18/2020  Total Protein 6.5 - 8.1 g/dL 6.5 7.2 8.0  Albumin 3.5 - 5.0 g/dL 2.5(L) 2.8(L) 3.9  AST 15 - 41 U/L 50(H) 190(H) 19  ALT 0 - 44 U/L 45(H) 66(H) 16  Alk Phosphatase 38 - 126 U/L 53 79 79  Total Bilirubin 0.3 - 1.2 mg/dL 0.9 0.5 0.4  Bilirubin, Direct 0.0 - 0.2 mg/dL 0.2 - -   CBC Latest Ref Rng & Units 08/28/2020 08/27/2020 08/26/2020  WBC 4.0 - 10.5 K/uL 10.7(H) 12.3(H) 11.9(H)    Hemoglobin 12.0 - 15.0 g/dL 8.7(L) 8.9(L) 8.4(L)  Hematocrit 36 - 46 % 27.9(L) 27.8(L) 26.2(L)  Platelets 150 - 400 K/uL 387 328 344   Lipid Panel     Component Value Date/Time   CHOL 130 08/29/2020 0250   TRIG 89 08/29/2020 0250   HDL 47 08/29/2020 0250   CHOLHDL 2.8 08/29/2020 0250   VLDL 18 08/29/2020 0250   LDLCALC 65 08/29/2020 0250   Cardiac Panel (last 3 results) No results for input(s): CKTOTAL, CKMB, TROPONINI, RELINDX in the last 72 hours.  HEMOGLOBIN A1C Lab Results  Component Value Date   HGBA1C 6.7 (H) 08/27/2020   MPG 145.59 08/27/2020   TSH Recent Labs    03/13/20 0949 08/22/20 1759  TSH 1.190 1.054    Scheduled Meds: . amiodarone  200 mg Oral Daily  . aspirin  81 mg Oral Daily  . bumetanide (BUMEX) IV  1 mg Intravenous Q12H  . cholecalciferol  3,000 Units Oral Daily  . colchicine  0.3 mg Oral Daily  . diclofenac Sodium  2 g Topical QID  . isosorbide-hydrALAZINE  2 tablet Oral TID  . metoprolol tartrate  25 mg Oral BID  . pantoprazole  40 mg Oral BID  .  polyethylene glycol  17 g Oral BID  . rosuvastatin  40 mg Oral QHS  . senna-docusate  1 tablet Oral BID  . sodium chloride flush  10-40 mL Intracatheter Q12H  . sucralfate  1 g Oral TID WC & HS   Continuous Infusions: . sodium chloride Stopped (08/24/20 1757)  . heparin 900 Units/hr (08/29/20 0830)  . methocarbamol (ROBAXIN) IV     PRN Meds:.sodium chloride, acetaminophen **OR** acetaminophen, alum & mag hydroxide-simeth, bisacodyl, hydrALAZINE, HYDROcodone-acetaminophen, HYDROmorphone (DILAUDID) injection, menthol-cetylpyridinium **OR** phenol, methocarbamol **OR** methocarbamol (ROBAXIN) IV, nitroGLYCERIN, ondansetron **OR** ondansetron (ZOFRAN) IV, promethazine, sodium chloride flush  Assessment/Plan:  1.  Peri-operative non-ST elevation myocardial infarction involving proximal LAD.  Completed infarct. 2.  Acute systolic heart failure 3.  Acute renal insufficiency, contributed by decreased  cardiac output.  Patient only received 17 mL of contrast during coronary angiography.  Stage IV chronic kidney disease.  Baseline stage IIIb chronic kidney disease.  Do not suspect contrast nephropathy but overall hemodynamic low output acute renal failure. 4.  Hypertension well controlled. 5.  New onset atrial fibrillation, related to ACS. 6. Embolic stroke probably related to A. Fib.  CHA2DS2-VASc Score is 8.  Yearly risk of stroke: >10% (A, F, DM, HTN, Vasc Dz, Stroke).  Score of 1=0.6; 2=2.2; 3=3.2; 4=4.8; 5=7.2; 6=9.8; 7=>9.8) -(CHF; HTN; vasc disease DM,  Female = 1; Age <65 =0; 65-74 = 1,  >75 =2; stroke/embolism= 2).   7. Bibasilar rales. Will order incentive spirometry  Recommendation:   Patient's acute CHF has resolved and stable, she is tolerating cardiac medications well.  She has new stroke with minimal hemorrhagic conversion.  I will start Eliquis today, D/W Neurosurgery, no contraindication and reviewed neurology notes.   As previously recommended, no plans for coronary angiography for now until she is medically stable including renal function, new stroke.  Blood pressures well controlled, she is maintaining sinus rhythm.  Due to tachycardia, will increase BB to Metoprolol succinate 50 mg BID.  Change Bumex IV to PO. Continue amiodarone and present medications. We will see her next week in the office for cardiac management.    [name], MD, [facility] 08/29/2020, 10:07 AM Office: [phone]

## 2020-08-29 NOTE — Progress Notes (Deleted)
Kristin [name] for Heparin>> apixaban Indication: afib/CVA  Assessment: 79 yo female with new onset ACS. Pt also has new onset AFib. Patient has had a recent Neurosurgical procedure, therefore Neurosurgery is requesting no boluses and a tight APTT goal of 50-60.   Pt noted to have acute CVA 9/19 pm, concern it was cardioembolic. Per  Neurosurgery note, okay to begin oral anticoagulation. Pharmacy consulted to dose apixaban -Hg= 8.7, plt= 387 -SCr= 1.91 (baseline ~ 1.1)    Goal of Therapy:  Monitor platelets by anticoagulation protocol: Yes   Plan:  -apixaban 2.5mg  po bid -If SCr < 1.5 will need to change to 5mg  po bid  Kristin [name], PharmD Clinical Pharmacist **Pharmacist phone directory can now be found on [email or web address] ([email or web address]).  Listed under [place].

## 2020-08-29 NOTE — Progress Notes (Signed)
Transitions of Care Pharmacist Note  Kristin [name] is a 79 y.o. female that has been diagnosed with A Fib and will be prescribed Eliquis (apixaban) at discharge.   Patient Education: I provided the following education on Apixaban to the patient: How to take the medication Described what the medication is Signs of bleeding Signs/symptoms of VTE and stroke  Answered their questions  Discharge Medications Plan: The patient is not interested in filling their discharge medications with the Transitions of Care pharmacy at this time.   If the patient later decides they would like to have the Transitions of Care pharmacy fill their discharge medications, please call us at [phone]. Thank you.    Thank you,   Norina Buzzard, PharmD PGY1 Pharmacy Resident 08/29/2020 6:53 PM  August 29, 2020

## 2020-08-30 DIAGNOSIS — R42 Dizziness and giddiness: Secondary | ICD-10-CM | POA: Diagnosis not present

## 2020-08-30 DIAGNOSIS — Z20822 Contact with and (suspected) exposure to covid-19: Secondary | ICD-10-CM | POA: Diagnosis not present

## 2020-08-30 DIAGNOSIS — N178 Other acute kidney failure: Secondary | ICD-10-CM | POA: Diagnosis not present

## 2020-08-30 DIAGNOSIS — J810 Acute pulmonary edema: Secondary | ICD-10-CM | POA: Diagnosis not present

## 2020-08-30 DIAGNOSIS — J9601 Acute respiratory failure with hypoxia: Secondary | ICD-10-CM | POA: Diagnosis not present

## 2020-08-30 DIAGNOSIS — I480 Paroxysmal atrial fibrillation: Secondary | ICD-10-CM | POA: Diagnosis not present

## 2020-08-30 DIAGNOSIS — I5023 Acute on chronic systolic (congestive) heart failure: Secondary | ICD-10-CM | POA: Diagnosis not present

## 2020-08-30 DIAGNOSIS — D6489 Other specified anemias: Secondary | ICD-10-CM | POA: Diagnosis not present

## 2020-08-30 DIAGNOSIS — I214 Non-ST elevation (NSTEMI) myocardial infarction: Secondary | ICD-10-CM | POA: Diagnosis not present

## 2020-08-30 DIAGNOSIS — R630 Anorexia: Secondary | ICD-10-CM | POA: Diagnosis not present

## 2020-08-30 DIAGNOSIS — I6389 Other cerebral infarction: Secondary | ICD-10-CM | POA: Diagnosis not present

## 2020-08-30 DIAGNOSIS — G459 Transient cerebral ischemic attack, unspecified: Secondary | ICD-10-CM | POA: Diagnosis not present

## 2020-08-30 DIAGNOSIS — U071 COVID-19: Secondary | ICD-10-CM | POA: Diagnosis not present

## 2020-08-30 DIAGNOSIS — I5021 Acute systolic (congestive) heart failure: Secondary | ICD-10-CM | POA: Diagnosis not present

## 2020-08-30 DIAGNOSIS — I7789 Other specified disorders of arteries and arterioles: Secondary | ICD-10-CM | POA: Diagnosis not present

## 2020-08-30 DIAGNOSIS — M961 Postlaminectomy syndrome, not elsewhere classified: Secondary | ICD-10-CM | POA: Diagnosis not present

## 2020-08-30 DIAGNOSIS — D509 Iron deficiency anemia, unspecified: Secondary | ICD-10-CM | POA: Diagnosis not present

## 2020-08-30 DIAGNOSIS — N179 Acute kidney failure, unspecified: Secondary | ICD-10-CM | POA: Diagnosis not present

## 2020-08-30 DIAGNOSIS — G6289 Other specified polyneuropathies: Secondary | ICD-10-CM | POA: Diagnosis not present

## 2020-08-30 DIAGNOSIS — Z7401 Bed confinement status: Secondary | ICD-10-CM | POA: Diagnosis not present

## 2020-08-30 DIAGNOSIS — I10 Essential (primary) hypertension: Secondary | ICD-10-CM | POA: Diagnosis not present

## 2020-08-30 DIAGNOSIS — M1711 Unilateral primary osteoarthritis, right knee: Secondary | ICD-10-CM | POA: Diagnosis not present

## 2020-08-30 DIAGNOSIS — Z48811 Encounter for surgical aftercare following surgery on the nervous system: Secondary | ICD-10-CM | POA: Diagnosis not present

## 2020-08-30 DIAGNOSIS — N183 Chronic kidney disease, stage 3 unspecified: Secondary | ICD-10-CM | POA: Diagnosis not present

## 2020-08-30 DIAGNOSIS — M25461 Effusion, right knee: Secondary | ICD-10-CM | POA: Diagnosis not present

## 2020-08-30 DIAGNOSIS — I119 Hypertensive heart disease without heart failure: Secondary | ICD-10-CM | POA: Diagnosis not present

## 2020-08-30 DIAGNOSIS — M2550 Pain in unspecified joint: Secondary | ICD-10-CM | POA: Diagnosis not present

## 2020-08-30 DIAGNOSIS — N184 Chronic kidney disease, stage 4 (severe): Secondary | ICD-10-CM | POA: Diagnosis not present

## 2020-08-30 DIAGNOSIS — G464 Cerebellar stroke syndrome: Secondary | ICD-10-CM | POA: Diagnosis not present

## 2020-08-30 DIAGNOSIS — R6510 Systemic inflammatory response syndrome (SIRS) of non-infectious origin without acute organ dysfunction: Secondary | ICD-10-CM | POA: Diagnosis not present

## 2020-08-30 DIAGNOSIS — I2510 Atherosclerotic heart disease of native coronary artery without angina pectoris: Secondary | ICD-10-CM | POA: Diagnosis not present

## 2020-08-30 DIAGNOSIS — M1120 Other chondrocalcinosis, unspecified site: Secondary | ICD-10-CM | POA: Diagnosis not present

## 2020-08-30 LAB — SARS CORONAVIRUS 2 BY RT PCR (HOSPITAL ORDER, PERFORMED IN ~~LOC~~ HOSPITAL LAB): SARS Coronavirus 2: NEGATIVE

## 2020-08-30 LAB — GLUCOSE, CAPILLARY
Glucose-Capillary: 132 mg/dL — ABNORMAL HIGH (ref 70–99)
Glucose-Capillary: 147 mg/dL — ABNORMAL HIGH (ref 70–99)

## 2020-08-30 MED ORDER — COLCHICINE 0.6 MG PO TABS
0.3000 mg | ORAL_TABLET | Freq: Every day | ORAL | 0 refills | Status: DC
Start: 2020-08-31 — End: 2024-04-09

## 2020-08-30 MED ORDER — BUMETANIDE 1 MG PO TABS
1.0000 mg | ORAL_TABLET | Freq: Two times a day (BID) | ORAL | 0 refills | Status: DC
Start: 2020-08-30 — End: 2020-10-27

## 2020-08-30 MED ORDER — PANTOPRAZOLE SODIUM 40 MG PO TBEC: 40.0000 mg | DELAYED_RELEASE_TABLET | Freq: Every day | ORAL | 0 refills | Status: AC

## 2020-08-30 MED ORDER — METOPROLOL TARTRATE 50 MG PO TABS
50.0000 mg | ORAL_TABLET | Freq: Two times a day (BID) | ORAL | 0 refills | Status: DC
Start: 2020-08-30 — End: 2020-09-29

## 2020-08-30 MED ORDER — POLYETHYLENE GLYCOL 3350 17 G PO PACK: 17.0000 g | PACK | Freq: Every day | ORAL | 0 refills | Status: DC | PRN

## 2020-08-30 MED ORDER — ONDANSETRON HCL 4 MG PO TABS: 4.0000 mg | ORAL_TABLET | Freq: Four times a day (QID) | ORAL | 0 refills | Status: DC | PRN

## 2020-08-30 MED ORDER — ISOSORB DINITRATE-HYDRALAZINE 20-37.5 MG PO TABS: 2.0000 | ORAL_TABLET | Freq: Three times a day (TID) | ORAL | 0 refills | Status: DC

## 2020-08-30 MED ORDER — APIXABAN 2.5 MG PO TABS
2.5000 mg | ORAL_TABLET | Freq: Two times a day (BID) | ORAL | 0 refills | Status: DC
Start: 2020-08-30 — End: 2020-10-27

## 2020-08-30 MED ORDER — NITROGLYCERIN 0.4 MG SL SUBL: 0.4000 mg | SUBLINGUAL_TABLET | SUBLINGUAL | 0 refills | Status: DC | PRN

## 2020-08-30 MED ORDER — AMIODARONE HCL 200 MG PO TABS
200.0000 mg | ORAL_TABLET | Freq: Every day | ORAL | 0 refills | Status: DC
Start: 2020-08-31 — End: 2020-10-27

## 2020-08-30 MED ORDER — DICLOFENAC SODIUM 1 % EX GEL: 2.0000 g | Freq: Four times a day (QID) | CUTANEOUS | Status: AC

## 2020-08-30 MED ORDER — ASPIRIN 81 MG PO CHEW
81.0000 mg | CHEWABLE_TABLET | Freq: Every day | ORAL | 0 refills | Status: DC
Start: 2020-08-31 — End: 2020-09-29

## 2020-08-30 MED ORDER — METHOCARBAMOL 500 MG PO TABS: 500.0000 mg | ORAL_TABLET | Freq: Four times a day (QID) | ORAL | 0 refills | Status: DC | PRN

## 2020-08-30 NOTE — Progress Notes (Signed)
Physical Therapy Treatment Patient Details Name: Kristin [name] MRN: [id number] DOB: 17-Nov-1941 Today's Date: 08/30/2020    History of Present Illness Pt is an 79 y.o. female admitted 08/18/20 with worsening LBP and LE weakness. S/p lumbar laminectomy for decompression 9/11. On 9/14, pt with MI, new onset afib, required BiPAP. 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 PMH includes HTN, CKD, DM, chronic LBP.    PT Comments    Pt fully participated in session; pt progress with increased ambulation tolerance; pt continues to demonstrate overall weakness, ataxia and poor safety awareness during ambulation; pt will benefit from continued still PT intervention to address deficits and maximize independence with functional mobility prior to discharge; per RN family and pt requesting SNF, frequency modified.    Follow Up Recommendations  SNF     Equipment Recommendations  Other (comment) (will defer to next facility)    Recommendations for Other Services       Precautions / Restrictions Precautions Precautions: Back Precaution Booklet Issued: No Precaution Comments: pt able to recall 3/3 precautions Restrictions Weight Bearing Restrictions: No    Mobility  Bed Mobility Overal bed mobility: Needs Assistance       Supine to sit: Supervision;HOB elevated        Transfers Overall transfer level: Needs assistance Equipment used: Rolling walker (2 wheeled) Transfers: Sit to/from Stand Sit to Stand: Min assist;Min guard         General transfer comment: performed 5 sit<>stand reps EOB with min A-min guard wtih continued trials; performed sit<>stand from [facility] at end fo session wtih min A needed  Ambulation/Gait Ambulation/Gait assistance: Min assist;Mod assist Gait Distance (Feet): 50 Feet Assistive device: Rolling walker (2  wheeled) Gait Pattern/deviations: Step-through pattern;Wide base of support;Ataxic Gait velocity: Decreased   General Gait Details: unsteady with turns, increased ataxia noted when fatigued, reliant on HHA and min-modA to maintain stability ambulating,  pt requiring assist with management of RW during turns   Stairs             Wheelchair Mobility    Modified Rankin (Stroke Patients Only) Modified Rankin (Stroke Patients Only) Pre-Morbid Rankin Score: No significant disability Modified Rankin: Moderately severe disability     Balance Overall balance assessment: Needs assistance Sitting-balance support: Feet supported Sitting balance-Leahy Scale: Fair     Standing balance support: Bilateral upper extremity supported;Single extremity supported Standing balance-Leahy Scale: Poor Standing balance comment: reliant on 1-2 UE support during standing tasks, ambulation BUE, hygiene during toileting 1 UE support                            Cognition Arousal/Alertness: Awake/alert Behavior During Therapy: WFL for tasks assessed/performed Overall Cognitive Status: No family/caregiver present to determine baseline cognitive functioning Area of Impairment: Orientation;Attention                   Current Attention Level: Selective   Following Commands: Follows one step commands consistently;Follows multi-step commands with increased time Safety/Judgement: Decreased awareness of safety;Decreased awareness of deficits   Problem Solving: Slow processing;Decreased initiation General Comments: pt requiring increased cueing for safety during ambulatio with use of RW      Exercises      General Comments General comments (skin integrity, edema, etc.): VSS      Pertinent Vitals/Pain Pain Assessment: No/denies  pain    Home Living                      Prior Function            PT Goals (current goals can now be found in the care plan section) Acute  Rehab PT Goals Patient Stated Goal: to get stonger PT Goal Formulation: With patient Time For Goal Achievement: 09/03/20 Potential to Achieve Goals: Good Progress towards PT goals: Progressing toward goals    Frequency    Min 2X/week      PT Plan Frequency needs to be updated    Co-evaluation              AM-PAC PT "6 Clicks" Mobility   Outcome Measure  Help needed turning from your back to your side while in a flat bed without using bedrails?: A Little Help needed moving from lying on your back to sitting on the side of a flat bed without using bedrails?: A Little Help needed moving to and from a bed to a chair (including a wheelchair)?: A Little Help needed standing up from a chair using your arms (e.g., wheelchair or bedside chair)?: A Little Help needed to walk in hospital room?: A Lot Help needed climbing 3-5 steps with a railing? : A Lot 6 Click Score: 16    End of Session Equipment Utilized During Treatment: Gait belt Activity Tolerance: Patient tolerated treatment well Patient left: in chair;with call bell/phone within reach;with chair alarm set Nurse Communication: Mobility status PT Visit Diagnosis: Unsteadiness on feet (R26.81);Other abnormalities of gait and mobility (R26.89);Ataxic gait (R26.0);Difficulty in walking, not elsewhere classified (R26.2)     Time: 8329-1916 PT Time Calculation (min) (ACUTE ONLY): 21 min  Charges:  $Gait Training: 8-22 mins                     Lyanne Co, DPT Acute Rehabilitation Services [phone]   [place] 08/30/2020, 11:43 AM

## 2020-08-30 NOTE — Discharge Summary (Signed)
Physician Discharge Summary  Kristin [name] UXN:[id number] DOB: 1941/10/25 DOA: 08/18/2020  PCP: Caren Macadam, MD  Admit date: 08/18/2020 Discharge date: 08/30/2020  Admitted From: Home Disposition: SNF  Recommendations for Outpatient Follow-up:  1. Follow up with SNF provider at earliest convenience 2. Outpatient follow-up with neurology/cardiology/neurosurgery 3. Follow up in ED if symptoms worsen or new appear   Home Health: No Equipment/Devices: None  Discharge Condition: Stable CODE STATUS: Full Diet recommendation: Heart healthy  Brief/Interim Summary: 79 year old female with history of chronic back pain, hypertension, diabetes mellitus type 2 came to ED with complaints of bilateral lower extremity weakness and worsening lower back pain.  Patient had an MRI on 04/13/2020 which showed advanced lumbar facet arthrosis with multilevel degenerative anterolisthesis, severe spinal stenosis at L2-3 and L3-4.  Mild to moderate spinal stenosis at L4-5.  MRI ordered during this admission showed similar changes.  Neurosurgery was consulted and surgical intervention was recommended for possible phlegmon at L3-4.  No evidence of infection was found during lumbar laminectomy.  During the hospitalization, she developed acute hypoxic respiratory failure on 08/22/2020, likely from pulmonary edema.  Cardiology was consulted.  She was found to have non-STEMI and underwent diagnostic cardiac cath.  Subsequently, she developed acute kidney injury; due to high bleeding risk, aspirin and Eliquis were held as per neurosurgery.  She then complained of dizziness and was diagnosed with acute left cerebellar stroke.  Neurology was consulted.  She was started back on aspirin and Eliquis after clearance by neurosurgery.  Cardiology recommends outpatient follow-up.  PT recommended SNF placement.  She will be discharged to SNF once bed is available.   Discharge Diagnoses:   SIRS: -No clear source of infection.  No  evidence of lumbar spine or knee joint infection.  Initially started on vancomycin/cefepime.  Vancomycin subsequently discontinued on 08/21/2020.  Last dose of cefepime on 08/24/2020.  No evidence of infection found during laminectomy and decompression.  Blood/urine/synovial fluid culture showed no growth.  CT of the abdomen was negative for infection.  Left cerebellar stroke -CT head showed large left lateral cerebellar stroke.  Neurology was consulted. - MRI brain obtained showed acute infarct in superior left cerebellum and right parietal cortex.  Small infarcts in right cerebellum and midbrain likely subacute.  Patient  is now on aspirin and Eliquis.  Continue rosuvastatin -Outpatient follow-up with neurology.  Diet as per SLP recommendations.  Continue PT follow-up at SNF  Neck pain/muscle spasm -Improving with Robaxin  L2 -L4 severe lumbar stenosis-patient presented with lower extremity pain and numbness.  Underwent bilateral L2, L3, partial L4 laminectomy for decompression of neural elements on 08/19/2020.  No evidence of infection was found. -Outpatient follow-up with neurosurgery.  Acute hypoxic respiratory failure -Secondary to pulmonary edema.  Respiratory status improved.  Diuretic plan as below  Non-STEMI -Cardiology following.  Underwent diagnostic catheterization which showed proximal LAD occluded with LVEF 35% -Outpatient follow-up with cardiology.  Continue aspirin, Eliquis, Bumex, BiDil and metoprolol.  Acute systolic heart failure -Strict input and output.  Daily weights.  Fluid restriction.  Continue medical management as above.  Outpatient follow-up with cardiology.  New onset paroxysmal A. Fib -Currently rate controlled.  Continue Eliquis and metoprolol along with amiodarone.  Outpatient follow-up with cardiology  Bilateral knee pain/pseudogout -Underwent right knee arthrocentesis with synovial fluid consistent with inflammatory pseudogout.  Underwent Depo-Medrol  injection of right knee.  Continue colchicine along with Voltaren gel.  Outpatient follow-up with orthopedics.  Hypertension -Antihypertensive as above.  Monitor  AKI -Follow creatinine  as an outpatient.    Discharge Instructions  Discharge Instructions    Ambulatory referral to Neurology   Complete by: As directed    Follow up with stroke clinic NP (Jessica Vanschaick or Cecille Rubin, if both not available, consider Zachery Dauer, or Ahern) at [facility] in about 4 weeks. Thanks.   Diet - low sodium heart healthy   Complete by: As directed    Discharge wound care:   Complete by: As directed    Please call neurosurgery regarding any recommendations on discharge wound care   Incentive spirometry RT   Complete by: As directed    Increase activity slowly   Complete by: As directed      Allergies as of 08/30/2020   No Known Allergies     Medication List    STOP taking these medications   amLODipine 10 MG tablet Commonly known as: NORVASC   carvedilol 12.5 MG tablet Commonly known as: COREG   cloNIDine 0.1 MG tablet Commonly known as: CATAPRES   hydrALAZINE 25 MG tablet Commonly known as: APRESOLINE   HYDROcodone-acetaminophen 5-325 MG tablet Commonly known as: NORCO/VICODIN   losartan 25 MG tablet Commonly known as: COZAAR   oxyCODONE-acetaminophen 5-325 MG tablet Commonly known as: PERCOCET/ROXICET   predniSONE 10 MG tablet Commonly known as: DELTASONE   traZODone 50 MG tablet Commonly known as: DESYREL     TAKE these medications   amiodarone 200 MG tablet Commonly known as: PACERONE Take 1 tablet (200 mg total) by mouth daily. Start taking on: August 31, 2020   apixaban 2.5 MG Tabs tablet Commonly known as: ELIQUIS Take 1 tablet (2.5 mg total) by mouth 2 (two) times daily.   aspirin 81 MG chewable tablet Chew 1 tablet (81 mg total) by mouth daily. Start taking on: August 31, 2020   bumetanide 1 MG tablet Commonly known as: BUMEX Take 1  tablet (1 mg total) by mouth 2 (two) times daily.   cholecalciferol 25 MCG (1000 UNIT) tablet Commonly known as: VITAMIN D Take 3,000 Units by mouth daily.   colchicine 0.6 MG tablet Take 0.5 tablets (0.3 mg total) by mouth daily. Start taking on: August 31, 2020   diclofenac Sodium 1 % Gel Commonly known as: VOLTAREN Apply 2 g topically 4 (four) times daily.   isosorbide-hydrALAZINE 20-37.5 MG tablet Commonly known as: BIDIL Take 2 tablets by mouth 3 (three) times daily.   methocarbamol 500 MG tablet Commonly known as: ROBAXIN Take 1 tablet (500 mg total) by mouth every 6 (six) hours as needed for muscle spasms.   metoprolol tartrate 50 MG tablet Commonly known as: LOPRESSOR Take 1 tablet (50 mg total) by mouth 2 (two) times daily.   Misc. Devices Misc Please dispense one power stair lift for at home daily use prn debility.   nitroGLYCERIN 0.4 MG SL tablet Commonly known as: NITROSTAT Place 1 tablet (0.4 mg total) under the tongue every 5 (five) minutes as needed for chest pain.   ondansetron 4 MG tablet Commonly known as: ZOFRAN Take 1 tablet (4 mg total) by mouth every 6 (six) hours as needed for nausea.   pantoprazole 40 MG tablet Commonly known as: PROTONIX Take 1 tablet (40 mg total) by mouth daily.   polyethylene glycol 17 g packet Commonly known as: MIRALAX / GLYCOLAX Take 17 g by mouth daily as needed for moderate constipation.   rosuvastatin 20 MG tablet Commonly known as: CRESTOR Take 1 tablet (20 mg total) by mouth at bedtime.  Discharge Care Instructions  (From admission, onward)         Start     Ordered   08/30/20 0000  Discharge wound care:       Comments: Please call neurosurgery regarding any recommendations on discharge wound care   08/30/20 1116          Contact information for follow-up providers    [facility] Follow up.   Specialty: [place] Why: the office will call to schedule home health  visits Contact information: [place] [address] [phone]        Rex Kras, DO Follow up on 09/05/2020.   Specialties: Cardiology, Radiology, Vascular Surgery Why: 11:30 AM. Bring all medications Contact information: [place] [address] [phone]        [facility]. Schedule an appointment as soon as possible for a visit in 4 week(s).   Specialty: Neurology Contact information: [address] [place] [phone]           Contact information for after-discharge care    Destination    [facility] .   Service: Skilled Nursing Contact information: [address] [place] [phone]                 No Known Allergies  Consultations:  Neurosurgery/orthopedic/cardiology/neurology   Procedures/Studies: CT ABDOMEN PELVIS WO CONTRAST  Result Date: 08/22/2020 CLINICAL DATA:  Fever of unknown origin EXAM: CT ABDOMEN AND PELVIS WITHOUT CONTRAST TECHNIQUE: Multidetector CT imaging of the abdomen and pelvis was performed following the standard protocol without IV contrast. COMPARISON:  CT 03/31/2020 FINDINGS: Lower chest: Small bilateral pleural effusions. Ground-glass density within the bilateral lower lobes with partial consolidation. Borderline cardiomegaly. Hepatobiliary: No focal liver abnormality is seen. No gallstones, gallbladder wall thickening, or biliary dilatation. Pancreas: Unremarkable. No pancreatic ductal dilatation or surrounding inflammatory changes. Spleen: Normal in size without focal abnormality. Adrenals/Urinary Tract: Nodular thickening of the adrenal glands without dominant mass. Kidneys show no hydronephrosis. Cortical scarring in the mid left kidney. The urinary bladder is unremarkable. Stomach/Bowel: The stomach is nonenlarged. No dilated small bowel. No bowel wall thickening. Negative appendix.  Large amount of stool in the colon. Vascular/Lymphatic: Moderate aortic atherosclerosis without aneurysm. No suspicious nodes. Reproductive: Lobulated uterus with multiple fibroids some of which are calcified. 1.8 cm benign-appearing left adnexal cyst without change, no further workup recommended. Other: Negative for free air or free fluid. Fat containing umbilical hernia. Musculoskeletal: No acute osseous abnormality. Post laminectomy changes L2-L3 through L3-L4. Degenerative changes at multiple levels. IMPRESSION: 1. No CT evidence for acute intra-abdominal or pelvic abnormality. 2. Small bilateral pleural effusions. Partial consolidations within the bilateral lower lobes which may reflect atelectasis or pneumonia. Ground-glass density in the bilateral lower lobes which may be secondary to infection or edema. 3. Fibroid uterus. 4. Fat containing umbilical hernia. Aortic Atherosclerosis (ICD10-I70.0). Electronically Signed   By: Donavan Foil M.D.   On: 08/22/2020 02:03   DG Chest 1 View  Result Date: 08/23/2020 CLINICAL DATA:  Pulmonary edema EXAM: CHEST  1 VIEW COMPARISON:  08/22/2020 FINDINGS: Mild bilateral interstitial opacities. Small left pleural effusion. Cardiomediastinal contours are normal. Increased density at the right hilum is likely due to pulmonary vascular congestion given the relatively normal appearance on 08/18/2020. IMPRESSION: Mild pulmonary edema and small left pleural effusion. Electronically Signed   By: Ulyses Jarred M.D.   On: 08/23/2020 01:43  DG Chest 1 View  Result Date: 08/22/2020 CLINICAL DATA:  Hypoxia EXAM: CHEST  1 VIEW COMPARISON:  08/18/2020 FINDINGS: Marked worsening of bilateral airspace opacities in a pattern most suggestive of pulmonary edema. No pneumothorax or pleural effusion. Mild cardiomegaly. IMPRESSION: Marked worsening of bilateral airspace opacities, most suggestive of pulmonary edema. Electronically Signed   By: Ulyses Jarred M.D.   On: 08/22/2020 02:00    DG Knee 1-2 Views Left  Result Date: 08/19/2020 CLINICAL DATA:  Pain EXAM: LEFT KNEE - 1-2 VIEW COMPARISON:  None. FINDINGS: There are moderate tricompartmental degenerative changes, greatest within the medial and patellofemoral compartments. There is a small suprapatellar joint effusion. There is no acute displaced fracture or dislocation. There is mild prepatellar soft tissue swelling. IMPRESSION: Negative. Electronically Signed   By: Constance Holster M.D.   On: 08/19/2020 17:27   DG Knee 1-2 Views Right  Result Date: 08/19/2020 CLINICAL DATA:  Pain EXAM: RIGHT KNEE - 1-2 VIEW COMPARISON:  None. FINDINGS: There are end-stage degenerative changes of the right knee, greatest within the medial and patellofemoral compartments. There is no acute displaced fracture or dislocation. No significant joint effusion. There is mild prepatellar soft tissue swelling. Vascular calcifications are noted. IMPRESSION: 1. No acute displaced fracture or dislocation. 2. End-stage degenerative changes of the right knee, greatest within the medial and patellofemoral compartments. Electronically Signed   By: Constance Holster M.D.   On: 08/19/2020 17:26   CT HEAD WO CONTRAST  Result Date: 08/27/2020 CLINICAL DATA:  79 year old female with dizziness and blurred vision. EXAM: CT HEAD WITHOUT CONTRAST TECHNIQUE: Contiguous axial images were obtained from the base of the skull through the vertex without intravenous contrast. COMPARISON:  None. FINDINGS: Brain: Confluent abnormal hypodensity in the left cerebellar hemisphere, a especially the superior cerebellar artery territory (coronal image 43 and series 3, image 13. No significant posterior fossa mass effect at this time. No superimposed No acute intracranial hemorrhage identified. No ventriculomegaly. Patchy bilateral cerebral white matter hypodensity, slightly greater in the left hemisphere. Probable dilated perivascular space right basal ganglia. No acute cortically  based cerebral hemisphere infarct is identified. Vascular: Calcified atherosclerosis at the skull base. No suspicious intracranial vascular hyperdensity. Skull: Negative.  Hyperostosis, normal variant. Sinuses/Orbits: Visualized paranasal sinuses and mastoids are clear. Other: Postoperative changes to both globes. No acute orbit or scalp soft tissue finding. IMPRESSION: 1. Positive for acute to subacute Infarct of the Left Cerebellum, primarily the left SCA territory. No associated hemorrhage or mass effect at this time. 2. Supratentorial changes of chronic small vessel disease. No other acute intracranial abnormality. Electronically Signed   By: Genevie Ann M.D.   On: 08/27/2020 18:26   MR ANGIO HEAD WO CONTRAST  Result Date: 08/28/2020 CLINICAL DATA:  Stroke follow-up.  Dizziness and blurred vision EXAM: MRI HEAD WITHOUT CONTRAST MRA HEAD WITHOUT CONTRAST TECHNIQUE: Multiplanar, multiecho pulse sequences of the brain and surrounding structures were obtained without intravenous contrast. Angiographic images of the head were obtained using MRA technique without contrast. COMPARISON:  Head CT from yesterday FINDINGS: MRI HEAD FINDINGS Brain: Confirmed acute infarct in the superior left cerebellum, superior cerebellar artery distribution. Small acute to subacute infarcts seen in the lateral right cerebellum and midbrain. Moderate area of acute infarction in the low right parietal cortex. Chronic small vessel ischemia in the cerebral white matter, moderately extensive. No evidence of hemorrhage, hydrocephalus, or mass. The scan is progressively motion degraded, which could obscure findings. Vascular: Normal flow voids.  See below for MRA Skull  and upper cervical spine: Normal marrow signal Sinuses/Orbits: Negative MRA HEAD FINDINGS Left dominant vertebral artery. Symmetric carotid artery size. Fetal type left PCA. Extensive atheromatous irregularity of large and medium size vessels. Motion artifact likely contributes  to the degree of vessel irregularity. In the anterior circulation most notable is a moderate to advanced left M1 segment stenosis. In the posterior circulation there is extensive atheromatous irregularity of the basilar with prominent narrowing (over 50%) just before the SCA and PCA origin. Advanced left P1 2 junction and right P3, P4 stenoses. IMPRESSION: Brain MRI: 1. Acute infarcts in the superior left cerebellum and right parietal cortex. 2. Smaller infarcts seen in the right cerebellum and midbrain which are likely subacute. 3. Background of chronic small vessel ischemia. Intracranial MRA: Severe intracranial atherosclerosis with multifocal large and medium vessel stenosis as described above. Electronically Signed   By: Monte Fantasia M.D.   On: 08/28/2020 08:28   MR BRAIN WO CONTRAST  Result Date: 08/28/2020 CLINICAL DATA:  Stroke follow-up.  Dizziness and blurred vision EXAM: MRI HEAD WITHOUT CONTRAST MRA HEAD WITHOUT CONTRAST TECHNIQUE: Multiplanar, multiecho pulse sequences of the brain and surrounding structures were obtained without intravenous contrast. Angiographic images of the head were obtained using MRA technique without contrast. COMPARISON:  Head CT from yesterday FINDINGS: MRI HEAD FINDINGS Brain: Confirmed acute infarct in the superior left cerebellum, superior cerebellar artery distribution. Small acute to subacute infarcts seen in the lateral right cerebellum and midbrain. Moderate area of acute infarction in the low right parietal cortex. Chronic small vessel ischemia in the cerebral white matter, moderately extensive. No evidence of hemorrhage, hydrocephalus, or mass. The scan is progressively motion degraded, which could obscure findings. Vascular: Normal flow voids.  See below for MRA Skull and upper cervical spine: Normal marrow signal Sinuses/Orbits: Negative MRA HEAD FINDINGS Left dominant vertebral artery. Symmetric carotid artery size. Fetal type left PCA. Extensive atheromatous  irregularity of large and medium size vessels. Motion artifact likely contributes to the degree of vessel irregularity. In the anterior circulation most notable is a moderate to advanced left M1 segment stenosis. In the posterior circulation there is extensive atheromatous irregularity of the basilar with prominent narrowing (over 50%) just before the SCA and PCA origin. Advanced left P1 2 junction and right P3, P4 stenoses. IMPRESSION: Brain MRI: 1. Acute infarcts in the superior left cerebellum and right parietal cortex. 2. Smaller infarcts seen in the right cerebellum and midbrain which are likely subacute. 3. Background of chronic small vessel ischemia. Intracranial MRA: Severe intracranial atherosclerosis with multifocal large and medium vessel stenosis as described above. Electronically Signed   By: Monte Fantasia M.D.   On: 08/28/2020 08:28   MR CERVICAL SPINE WO CONTRAST  Result Date: 08/07/2020 CLINICAL DATA:  Cervical radiculopathy.  Spondylolisthesis. EXAM: MRI CERVICAL SPINE WITHOUT CONTRAST TECHNIQUE: Multiplanar, multisequence MR imaging of the cervical spine was performed. No intravenous contrast was administered. COMPARISON:  None FINDINGS: Alignment: Mild anterolisthesis C7-T1.  Remaining alignment normal. Vertebrae: Normal bone marrow.  Negative for fracture or mass. Cord: Normal signal and morphology. Posterior Fossa, vertebral arteries, paraspinal tissues: Negative Disc levels: C2-3: Small central disc protrusion and spurring. No significant spinal or foraminal stenosis C3-4: Small central disc protrusion and mild spurring. Negative for spinal or foraminal stenosis C4-5: Small central disc protrusion. Mild facet degeneration on the left. Negative for spinal or foraminal stenosis. C5-6: Broad-based central disc protrusion and associated spurring. Facet degeneration on the left. Mild foraminal stenosis bilaterally. C6-7: Shallow central disc protrusion.  No significant spinal or foraminal  stenosis C7-T1: 1 mm anterolisthesis. Central disc protrusion. Bilateral facet degeneration. Negative for spinal or foraminal stenosis. IMPRESSION: Multilevel degenerative change throughout the cervical spine as above. Multiple small central disc protrusions are present. Mild foraminal stenosis bilaterally C5-6 due to spurring. Electronically Signed   By: Franchot Gallo M.D.   On: 08/07/2020 10:08   MR Lumbar Spine W Wo Contrast  Result Date: 08/18/2020 CLINICAL DATA:  Bilateral groin pain and low back pain. Generalized weakness. Infection suspected. EXAM: MRI LUMBAR SPINE WITHOUT AND WITH CONTRAST TECHNIQUE: Multiplanar and multiecho pulse sequences of the lumbar spine were obtained without and with intravenous contrast. CONTRAST:  72mL GADAVIST GADOBUTROL 1 MMOL/ML IV SOLN COMPARISON:  04/23/2020 FINDINGS: Segmentation:  5 lumbar type vertebrae Alignment:  Grade 1 anterolisthesis at L2-3 to L4-5, degenerative. Vertebrae:  No fracture, evidence of discitis, or bone lesion. Conus medullaris and cauda equina: Conus extends to the L1 level. Conus appears normal. There is cauda equina distortion due to stenosis. Paraspinal and other soft tissues: Periarticular edematous signal and enhancement about the L2-3 and L3-4 facets. Inter spinous degenerative type enhancement is also seen at L1-2 where a bursa was noted on prior. New STIR hyperintensity in the left psoas when compared to prior. Distended urinary bladder Disc levels: T12- L1: Unremarkable. L1-L2: Interspinous ligamentous thickening.  Mild facet spurring. L2-L3: Facet osteoarthritis with marked ligamentum flavum thickening and bulky spurring. There is related anterolisthesis. The disc is bulging with superimposed biforaminal protrusion. Severe spinal stenosis. Left more than right moderate foraminal impingement. L3-L4: Facet osteoarthritis with bulky spurring and anterolisthesis. The disc is bulging with a broad left eccentric protrusion. Severe spinal  stenosis. Asymmetric left foraminal impingement. L4-L5: Facet osteoarthritis with spurring and anterolisthesis. There may be facet ankylosis. The disc is bulging with annular fissuring. Mild left foraminal stenosis. Mild triangular narrowing of the thecal sac. L5-S1:Disc narrowing and bulging with left paracentral protrusion and annular fissure contacting the descending S1 nerve root. Degenerative facet spurring with mild left foraminal narrowing. IMPRESSION: 1. Strain of the left psoas that is new since 04/23/2020. 2. Distended urinary bladder, please correlate for retention. 3. Advanced facet osteoarthritis at L2-3 and below. Posterior periarticular inflammation is seen at L1-2 to L3-4. 4. Advanced spinal stenosis at L2-3 and L3-4. 5. Multilevel moderate foraminal impingement as described. Electronically Signed   By: Monte Fantasia M.D.   On: 08/18/2020 08:25   CARDIAC CATHETERIZATION  Result Date: 08/23/2020 Left and right heart catheterization 08/23/2020: RA 13/15, mean 12, RA saturation 56%; RV 51/8, EDP 12 mmHg; PA 54/26, mean 38 mmHg, PA saturation 45%; PW 21/21, mean 20 mmHg, aortic saturation 97%. Cardiac output 4.96, cardiac index 2.51.  SVR elevated at 1258.  PVR mildly elevated at 290.  QP/QS 0.79. Left main: Mild disease. LAD: Proximal 80% stenosis followed by 100% occlusion.  Before the occlusion, at the subtotal site, there is a large diagonal that comes off and has secondary branches.  Apical LAD is barely visualized with minimal amount of collaterals.  Thrombus is evident at the site of occlusion. Circumflex: Large vessel, moderate-sized OM1 and moderate-sized OM 2.  Minimal disease. RCA: Ostial 80 to 90% stenosis.  Mild disease in the remaining of the vessel, PL branch has a 50% stenosis of the proximal and ostial segment.  Severe dampening was evident with engagement of 5 French diagnostic catheter. LV: EDP mildly elevated at 15- 18 mmHg.  No pressure gradient across the aortic valve.  LVEF 30  to  35% with entire anterior, anterolateral, apical and apical inferior akinesis.  No significant MR. Impression: Patient's presentation is consistent with acute systolic heart failure from recent acute myocardial infarction.  Patient is postop spine surgery and not a candidate for anticoagulation with full dose or with antiplatelet therapy for now in view of risk of development of spine hematoma and paraparesis.  Also the infarct appears to have been completed.  As her EDP is only 15 and wedge although shows mild elevation at 20, she is saturating well and heart rate is much better improved and controlled with regard to atrial fibrillation.  Hence I did not proceed with implantation of intra-aortic balloon pump.  Will be aggressive with fluid management on the floor and watch her carefully.  Have a low threshold for intubation if necessary.  I suspect she probably will recover as it has been greater than 24 hours since her infarct.  DG Lumbar Spine 1 View  Result Date: 08/19/2020 CLINICAL DATA:  L2-L4 laminectomy. EXAM: OPERATIVE LUMBAR SPINE 1 VIEW(S) COMPARISON:  MRI lumbar spine 08/18/2020. FINDINGS: A single spot LATERAL image obtained with the C-arm fluoroscopic device at 11:50 a.m. is submitted for interpretation post-operatively. A clamp is present on the L4 spinous process. IMPRESSION: L4 localized intraoperatively. Electronically Signed   By: Evangeline Dakin M.D.   On: 08/19/2020 14:00   DG Chest Port 1 View  Result Date: 08/22/2020 CLINICAL DATA:  Sudden onset shortness of breath. Body aches and fever. EXAM: PORTABLE CHEST 1 VIEW COMPARISON:  08/22/2020 FINDINGS: Low lung volumes are present, causing crowding of the pulmonary vasculature. Indistinct pulmonary vasculature with perihilar and basilar airspace opacities. The peripheral interstitial accentuation and Kerley B lines suggestive of interstitial edema have mildly improved, but the basilar airspace opacities are mildly worsened. Thoracic  spondylosis.  Heart size within normal limits. IMPRESSION: 1. Bilateral interstitial and airspace opacities potentially from edema or atypical pneumonia. Mildly worsened basilar airspace opacities although the interstitial accentuation has improved. Electronically Signed   By: Van Clines M.D.   On: 08/22/2020 18:22   DG Chest Port 1 View  Result Date: 08/18/2020 CLINICAL DATA:  Fever and body aches for 24 hours EXAM: PORTABLE CHEST 1 VIEW COMPARISON:  None. FINDINGS: Shallow inspiration. Heart size and pulmonary vascularity are normal. Lungs are clear. No pleural effusions. No pneumothorax. Mediastinal contours appear intact. Degenerative changes in the spine and shoulders. Calcification of the aorta. IMPRESSION: No active disease. Electronically Signed   By: Lucienne Capers M.D.   On: 08/18/2020 00:45   DG C-Arm 1-60 Min  Result Date: 08/19/2020 CLINICAL DATA:  L2-L4 laminectomy. EXAM: OPERATIVE LUMBAR SPINE 1 VIEW(S) COMPARISON:  MRI lumbar spine 08/18/2020. FINDINGS: A single spot LATERAL image obtained with the C-arm fluoroscopic device at 11:50 a.m. is submitted for interpretation post-operatively. A clamp is present on the L4 spinous process. IMPRESSION: L4 localized intraoperatively. Electronically Signed   By: Evangeline Dakin M.D.   On: 08/19/2020 14:00   ECHOCARDIOGRAM COMPLETE  Result Date: 08/22/2020    ECHOCARDIOGRAM REPORT   Patient Name:   BEAUTIFULL CISAR Date of Exam: 08/22/2020 Medical Rec #:  [id number]        Height:       64.0 in Accession #:    [id number]       Weight:       205.7 lb Date of Birth:  1941/11/23         BSA:          1.980 m Patient Age:  79 years         BP:           138/104 mmHg Patient Gender: F                HR:           105 bpm. Exam Location:  Inpatient Procedure: 2D Echo, Cardiac Doppler, Color Doppler and Intracardiac            Opacification Agent Indications:     Dyspnea  History:         Patient has no prior history of Echocardiogram  examinations.                  Risk Factors:Hypertension.  Sonographer:     Clayton Lefort RDCS (AE) Referring Phys:  [id number] [facility] A [place] Diagnosing Phys: Rex Kras DO IMPRESSIONS  1. Left ventricular ejection fraction, by estimation, is 30 to 35%. The left ventricle has moderately decreased function. The left ventricle demonstrates regional wall motion abnormalities. Hyokinesis of base to mid anterior wall, base to mid anterospetal wall, apical septal, and apex. There is moderate left ventricular hypertrophy. Diastolic function is not assessed due to atrial fibrillation. No apparent left ventricular thrombus.  2. Right ventricular systolic function is normal. The right ventricular size is normal. There is moderately elevated pulmonary artery systolic pressure. The estimated right ventricular systolic pressure is 18.5 mmHg.  3. The mitral valve is normal in structure. Mild mitral valve regurgitation. No evidence of mitral stenosis.  4. The aortic valve is normal in structure. Aortic valve regurgitation is not visualized. Mild aortic valve sclerosis is present, with no evidence of aortic valve stenosis.  5. The inferior vena cava is dilated in size with >50% respiratory variability, suggesting right atrial pressure of 8 mmHg. FINDINGS  Left Ventricle: Left ventricular ejection fraction, by estimation, is 30 to 35%. The left ventricle has moderately decreased function. The left ventricle demonstrates regional wall motion abnormalities. Hyokinesis of base to mid anterior wall, base to mid anterospetal wall, apical septal, and apex. Definity contrast agent was given IV to delineate the left ventricular endocardial borders. The left ventricular internal cavity size was normal in size. There is moderate left ventricular hypertrophy. Left  ventricular diastolic function could not be evaluated. Right Ventricle: The right ventricular size is normal. Right vetricular wall thickness was not well visualized. Right ventricular  systolic function is normal. There is moderately elevated pulmonary artery systolic pressure. The tricuspid regurgitant velocity is 4.07 m/s, and with an assumed right atrial pressure of 8 mmHg, the estimated right ventricular systolic pressure is 63.1 mmHg. Left Atrium: Left atrial size was normal in size. Right Atrium: Right atrial size was normal in size. Pericardium: There is no evidence of pericardial effusion. Mitral Valve: The mitral valve is normal in structure. Mild mitral annular calcification. Mild mitral valve regurgitation. No evidence of mitral valve stenosis. Tricuspid Valve: The tricuspid valve is normal in structure. Tricuspid valve regurgitation is mild . No evidence of tricuspid stenosis. Aortic Valve: The aortic valve is normal in structure. Aortic valve regurgitation is not visualized. Mild aortic valve sclerosis is present, with no evidence of aortic valve stenosis. Aortic valve mean gradient measures 2.0 mmHg. Aortic valve peak gradient measures 3.9 mmHg. Aortic valve area, by VTI measures 1.72 cm. Pulmonic Valve: The pulmonic valve was grossly normal. Pulmonic valve regurgitation is trivial. Aorta: The aortic root and ascending aorta are structurally normal, with no evidence of dilitation. Venous: The inferior vena cava  is dilated in size with greater than 50% respiratory variability, suggesting right atrial pressure of 8 mmHg. IAS/Shunts: The atrial septum is grossly normal.  LEFT VENTRICLE PLAX 2D LVIDd:         4.00 cm LVIDs:         2.50 cm LV PW:         1.40 cm LV IVS:        1.30 cm LVOT diam:     1.80 cm LV SV:         26 LV SV Index:   13 LVOT Area:     2.54 cm  LV Volumes (MOD) LV vol d, MOD A2C: 107.0 ml LV vol d, MOD A4C: 89.0 ml LV vol s, MOD A2C: 78.5 ml LV vol s, MOD A4C: 61.0 ml LV SV MOD A2C:     28.5 ml LV SV MOD A4C:     89.0 ml LV SV MOD BP:      31.6 ml RIGHT VENTRICLE          IVC RV Basal diam:  2.60 cm  IVC diam: 2.00 cm LEFT ATRIUM             Index       RIGHT  ATRIUM          Index LA diam:        3.70 cm 1.87 cm/m  RA Area:     9.43 cm LA Vol (A2C):   63.2 ml 31.92 ml/m RA Volume:   18.60 ml 9.39 ml/m LA Vol (A4C):   61.3 ml 30.96 ml/m LA Biplane Vol: 63.1 ml 31.87 ml/m  AORTIC VALVE AV Area (Vmax):    1.82 cm AV Area (Vmean):   1.92 cm AV Area (VTI):     1.72 cm AV Vmax:           98.50 cm/s AV Vmean:          70.700 cm/s AV VTI:            0.151 m AV Peak Grad:      3.9 mmHg AV Mean Grad:      2.0 mmHg LVOT Vmax:         70.57 cm/s LVOT Vmean:        53.467 cm/s LVOT VTI:          0.102 m LVOT/AV VTI ratio: 0.67  AORTA Ao Root diam: 3.60 cm Ao Asc diam:  3.40 cm TRICUSPID VALVE TR Peak grad:   66.3 mmHg TR Vmax:        407.00 cm/s  SHUNTS Systemic VTI:  0.10 m Systemic Diam: 1.80 cm Sunit Tolia DO Electronically signed by Rex Kras DO Signature Date/Time: 08/22/2020/4:31:42 PM    Final    US THYROID  Result Date: 08/01/2020 CLINICAL DATA:  Anterior neck fullness on exam, thyromegaly EXAM: THYROID ULTRASOUND TECHNIQUE: Ultrasound examination of the thyroid gland and adjacent soft tissues was performed. COMPARISON:  None. FINDINGS: Parenchymal Echotexture: Mildly heterogenous Isthmus: 5 mm Right lobe: 5.6 x 1.6 x 2.1 cm Left lobe: 5.1 x 1.2 x 2.4 cm _________________________________________________________ Estimated total number of nodules >/= 1 cm: 0 Number of spongiform nodules >/=  2 cm not described below (TR1): 0 Number of mixed cystic and solid nodules >/= 1.5 cm not described below (TR2): 0 _________________________________________________________ Nodule # 1: Location: Right; Superior Maximum size: 0.7 cm; Other 2 dimensions: 0.7 x 0.6 cm Composition: solid/almost completely solid (2) Echogenicity: isoechoic (1) Shape: not taller-than-wide (  0) Margins: ill-defined (0) Echogenic foci: peripheral calcifications (2) ACR TI-RADS total points: 5. ACR TI-RADS risk category: TR4 (4-6 points). ACR TI-RADS recommendations: Given size (<0.9 cm) and appearance,  this nodule does NOT meet TI-RADS criteria for biopsy or dedicated follow-up. _________________________________________________________ Nodule # 2: Location: Left; Inferior Maximum size: 0.8 cm; Other 2 dimensions: 0.7 x 0.5 cm Composition: solid/almost completely solid (2) Echogenicity: isoechoic (1) Shape: not taller-than-wide (0) Margins: ill-defined (0) Echogenic foci: none (0) ACR TI-RADS total points: 3. ACR TI-RADS risk category: TR3 (3 points). ACR TI-RADS recommendations: Given size (<1.4 cm) and appearance, this nodule does NOT meet TI-RADS criteria for biopsy or dedicated follow-up. _________________________________________________________ Minor thyroid heterogeneity and slight thyroid enlargement. No hypervascularity or regional adenopathy. IMPRESSION: 0.7 cm right superior TR 4 nodule and 0.8 cm left inferior TR 3 nodule. These nodules do not meet criteria for any biopsy or follow-up. Minor nonspecific thyroid enlargement and heterogeneity. The above is in keeping with the ACR TI-RADS recommendations - J Am Coll Radiol 2017;14:587-595. Electronically Signed   By: Jerilynn Mages.  Shick M.D.   On: 08/01/2020 15:45   VAS US CAROTID  Result Date: 08/28/2020 Carotid Arterial Duplex Study Indications:  TIA, CVA and Syncope. Risk Factors: Hypertension, hyperlipidemia. Performing Technologist: Griffin Basil  Examination Guidelines: A complete evaluation includes B-mode imaging, spectral Doppler, color Doppler, and power Doppler as needed of all accessible portions of each vessel. Bilateral testing is considered an integral part of a complete examination. Limited examinations for reoccurring indications may be performed as noted.  Right Carotid Findings: +----------+--------+--------+--------+------------------+------------------+           PSV cm/sEDV cm/sStenosisPlaque DescriptionComments           +----------+--------+--------+--------+------------------+------------------+ CCA Prox  159     25                                                    +----------+--------+--------+--------+------------------+------------------+ CCA Distal118     29                                intimal thickening +----------+--------+--------+--------+------------------+------------------+ ICA Prox  80      36      1-39%                     intimal thickening +----------+--------+--------+--------+------------------+------------------+ ICA Distal57      22                                                   +----------+--------+--------+--------+------------------+------------------+ ECA       100     14                                                   +----------+--------+--------+--------+------------------+------------------+ +----------+--------+-------+--------+-------------------+           PSV cm/sEDV cmsDescribeArm Pressure (mmHG) +----------+--------+-------+--------+-------------------+ [id number]     14                                 +----------+--------+-------+--------+-------------------+ +---------+--------+--+--------+--+----------+  VertebralPSV cm/s37EDV cm/s12Retrograde +---------+--------+--+--------+--+----------+  Left Carotid Findings: +----------+--------+--------+--------+------------------+------------------+           PSV cm/sEDV cm/sStenosisPlaque DescriptionComments           +----------+--------+--------+--------+------------------+------------------+ CCA Prox  102     27                                                   +----------+--------+--------+--------+------------------+------------------+ CCA Distal121     32                                intimal thickening +----------+--------+--------+--------+------------------+------------------+ ICA Prox  116     39      1-39%                     intimal thickening +----------+--------+--------+--------+------------------+------------------+ ICA Distal132     52                                                    +----------+--------+--------+--------+------------------+------------------+ ECA       106     19                                                   +----------+--------+--------+--------+------------------+------------------+ +----------+--------+--------+--------+-------------------+           PSV cm/sEDV cm/sDescribeArm Pressure (mmHG) +----------+--------+--------+--------+-------------------+ Subclavian190     5                                   +----------+--------+--------+--------+-------------------+ +---------+--------+--+--------+--+---------+ VertebralPSV cm/s70EDV cm/s31Antegrade +---------+--------+--+--------+--+---------+   Summary: Right Carotid: Velocities in the right ICA are consistent with a 1-39% stenosis. Left Carotid: Velocities in the left ICA are consistent with a 1-39% stenosis. Vertebrals: Left vertebral artery demonstrates antegrade flow. Right vertebral             artery demonstrates retrograde flow. *See table(s) above for measurements and observations.  Electronically signed by Antony Contras MD on 08/28/2020 at 5:04:28 PM.    Final        Subjective: Patient seen and examined at bedside.  She feels better and wants to go home.  No overnight fever, nausea, vomiting, chest pain or shortness of breath reported.  Discharge Exam: Vitals:   08/30/20 0427 08/30/20 0814  BP: 106/68 (!) 109/58  Pulse: 79 87  Resp: 18 18  Temp: 98.3 F (36.8 C) 98 F (36.7 C)  SpO2: 96% 100%    General: Pt is alert, awake, not in acute distress.  Elderly female lying in bed.  Poor historian. Cardiovascular: rate controlled, S1/S2 + Respiratory: bilateral decreased breath sounds at bases with some scattered crackles Abdominal: Soft, NT, ND, bowel sounds + Extremities: Trace lower extremity edema; no cyanosis    The results of significant diagnostics from this hospitalization (including imaging, microbiology, ancillary and  laboratory) are listed below for reference.     Microbiology: No results found for this or any previous visit (from the past  240 hour(s)).   Labs: BNP (last 3 results) Recent Labs    08/25/20 0637 08/26/20 0505 08/27/20 0818  BNP 517.4* 675.7* 741.2*   Basic Metabolic Panel: Recent Labs  Lab 08/25/20 0637 08/26/20 0505 08/27/20 0818 08/28/20 0211 08/29/20 0250  NA 134* 139 137 136 136  K 4.1 3.9 6.2* 4.3 4.7  CL 97* 102 101 98 96*  CO2 29 29 28 27 23   GLUCOSE 144* 132* 133* 137* 126*  BUN 36* 38* 34* 29* 33*  CREATININE 1.83* 1.87* 1.68* 1.79* 1.91*  CALCIUM 8.5* 8.7* 9.2 9.3 9.3   Liver Function Tests: Recent Labs  Lab 08/26/20 2304  AST 50*  ALT 45*  ALKPHOS 53  BILITOT 0.9  PROT 6.5  ALBUMIN 2.5*   Recent Labs  Lab 08/26/20 2304  LIPASE 27   No results for input(s): AMMONIA in the last 168 hours. CBC: Recent Labs  Lab 08/24/20 1205 08/25/20 0637 08/26/20 0505 08/27/20 0818 08/28/20 0211  WBC 11.2* 11.6* 11.9* 12.3* 10.7*  HGB 9.4* 9.2* 8.4* 8.9* 8.7*  HCT 29.3* 28.7* 26.2* 27.8* 27.9*  MCV 89.1 90.8 90.3 93.9 93.9  PLT 305 327 344 328 387   Cardiac Enzymes: No results for input(s): CKTOTAL, CKMB, CKMBINDEX, TROPONINI in the last 168 hours. BNP: Invalid input(s): POCBNP CBG: Recent Labs  Lab 08/29/20 0817 08/29/20 1156 08/29/20 1706 08/29/20 2227 08/30/20 0753  GLUCAP 135* 127* 163* 118* 132*   D-Dimer No results for input(s): DDIMER in the last 72 hours. Hgb A1c No results for input(s): HGBA1C in the last 72 hours. Lipid Profile Recent Labs    08/29/20 0250  CHOL 130  HDL 47  LDLCALC 65  TRIG 89  CHOLHDL 2.8   Thyroid function studies No results for input(s): TSH, T4TOTAL, T3FREE, THYROIDAB in the last 72 hours.  Invalid input(s): FREET3 Anemia work up No results for input(s): VITAMINB12, FOLATE, FERRITIN, TIBC, IRON, RETICCTPCT in the last 72 hours. Urinalysis    Component Value Date/Time   COLORURINE STRAW (A)  08/18/2020 0407   APPEARANCEUR CLEAR 08/18/2020 0407   LABSPEC 1.011 08/18/2020 0407   PHURINE 6.0 08/18/2020 0407   GLUCOSEU NEGATIVE 08/18/2020 0407   HGBUR NEGATIVE 08/18/2020 0407   BILIRUBINUR NEGATIVE 08/18/2020 0407   KETONESUR NEGATIVE 08/18/2020 0407   PROTEINUR NEGATIVE 08/18/2020 0407   NITRITE NEGATIVE 08/18/2020 0407   LEUKOCYTESUR NEGATIVE 08/18/2020 0407   Sepsis Labs Invalid input(s): PROCALCITONIN,  WBC,  LACTICIDVEN Microbiology No results found for this or any previous visit (from the past 240 hour(s)).   Time coordinating discharge: 35 minutes  SIGNED:   Aline August, MD  [facility] 08/30/2020, 11:20 AM

## 2020-08-30 NOTE — TOC Transition Note (Signed)
Transition of Care [facility]) - CM/SW Discharge Note   Patient Details  Name: Kristin [name] MRN: [id number] Date of Birth: October 22, 1941  [facility] CM/SW Contact:  Trula Ore, [place] Phone Number: 08/30/2020, 12:47 PM   Clinical Narrative:     Patient will DC to: [place]  Anticipated DC date: 08/30/2020  Family notified: Havelal  Transport by: Corey Harold  ?  Per MD patient ready for DC to [facility] . RN, patient, patient's family, and facility notified of DC. Discharge Summary sent to facility. RN given number for report [phone]#[id number]. DC packet on chart. Ambulance transport requested for patient.  CSW signing off.  Final next level of care: Skilled Nursing Facility Barriers to Discharge: No Barriers Identified   Patient Goals and CMS Choice Patient states their goals for this hospitalization and ongoing recovery are:: to go to SNF CMS Medicare.gov Compare Post Acute Care list provided to:: Patient Represenative (must comment) (Havelal) Choice offered to / list presented to : Adult Children [facility] ([place]  Discharge Placement              Patient chooses bed at: [facility] Patient to be transferred to facility by: [place] Name of family member notified: Havelal Patient and family notified of of transfer: 08/30/20  Discharge Plan and Services   Discharge Planning Services: CM Consult Post Acute Care Choice: Home Health                    HH Arranged: PT, OT [facility] Agency: [facility] [employer] Date [place]: 08/22/20 Time [place]: [id number] Representative spoke with at [place]: [place] ([place]) Interventions     Readmission Risk Interventions No flowsheet data found.

## 2020-08-30 NOTE — Progress Notes (Signed)
Report given to [facility] at [place]. All questions answered. Awaiting [facility] transport at this time.

## 2020-09-04 ENCOUNTER — Ambulatory Visit: Payer: Medicare HMO | Admitting: Cardiology

## 2020-09-04 DIAGNOSIS — N183 Chronic kidney disease, stage 3 unspecified: Secondary | ICD-10-CM | POA: Diagnosis not present

## 2020-09-04 DIAGNOSIS — I119 Hypertensive heart disease without heart failure: Secondary | ICD-10-CM | POA: Diagnosis not present

## 2020-09-04 DIAGNOSIS — I6389 Other cerebral infarction: Secondary | ICD-10-CM | POA: Diagnosis not present

## 2020-09-04 DIAGNOSIS — J9601 Acute respiratory failure with hypoxia: Secondary | ICD-10-CM | POA: Diagnosis not present

## 2020-09-04 DIAGNOSIS — M1120 Other chondrocalcinosis, unspecified site: Secondary | ICD-10-CM | POA: Diagnosis not present

## 2020-09-04 DIAGNOSIS — N178 Other acute kidney failure: Secondary | ICD-10-CM | POA: Diagnosis not present

## 2020-09-04 DIAGNOSIS — I214 Non-ST elevation (NSTEMI) myocardial infarction: Secondary | ICD-10-CM | POA: Diagnosis not present

## 2020-09-04 DIAGNOSIS — I480 Paroxysmal atrial fibrillation: Secondary | ICD-10-CM | POA: Diagnosis not present

## 2020-09-04 DIAGNOSIS — I7789 Other specified disorders of arteries and arterioles: Secondary | ICD-10-CM | POA: Diagnosis not present

## 2020-09-04 DIAGNOSIS — I5023 Acute on chronic systolic (congestive) heart failure: Secondary | ICD-10-CM | POA: Diagnosis not present

## 2020-09-05 ENCOUNTER — Ambulatory Visit: Payer: Medicare HMO | Admitting: Cardiology

## 2020-09-06 DIAGNOSIS — G6289 Other specified polyneuropathies: Secondary | ICD-10-CM | POA: Diagnosis not present

## 2020-09-06 DIAGNOSIS — I2510 Atherosclerotic heart disease of native coronary artery without angina pectoris: Secondary | ICD-10-CM | POA: Diagnosis not present

## 2020-09-06 DIAGNOSIS — I480 Paroxysmal atrial fibrillation: Secondary | ICD-10-CM | POA: Diagnosis not present

## 2020-09-06 DIAGNOSIS — N184 Chronic kidney disease, stage 4 (severe): Secondary | ICD-10-CM | POA: Diagnosis not present

## 2020-09-06 DIAGNOSIS — R42 Dizziness and giddiness: Secondary | ICD-10-CM | POA: Diagnosis not present

## 2020-09-06 DIAGNOSIS — R630 Anorexia: Secondary | ICD-10-CM | POA: Diagnosis not present

## 2020-09-06 DIAGNOSIS — D6489 Other specified anemias: Secondary | ICD-10-CM | POA: Diagnosis not present

## 2020-09-08 DIAGNOSIS — I10 Essential (primary) hypertension: Secondary | ICD-10-CM | POA: Diagnosis not present

## 2020-09-08 DIAGNOSIS — U071 COVID-19: Secondary | ICD-10-CM | POA: Diagnosis not present

## 2020-09-08 DIAGNOSIS — N179 Acute kidney failure, unspecified: Secondary | ICD-10-CM | POA: Diagnosis not present

## 2020-09-08 DIAGNOSIS — I480 Paroxysmal atrial fibrillation: Secondary | ICD-10-CM | POA: Diagnosis not present

## 2020-09-08 DIAGNOSIS — M961 Postlaminectomy syndrome, not elsewhere classified: Secondary | ICD-10-CM | POA: Diagnosis not present

## 2020-09-08 DIAGNOSIS — J811 Chronic pulmonary edema: Secondary | ICD-10-CM | POA: Diagnosis not present

## 2020-09-08 DIAGNOSIS — G464 Cerebellar stroke syndrome: Secondary | ICD-10-CM | POA: Diagnosis not present

## 2020-09-08 DIAGNOSIS — I5021 Acute systolic (congestive) heart failure: Secondary | ICD-10-CM | POA: Diagnosis not present

## 2020-09-08 DIAGNOSIS — I214 Non-ST elevation (NSTEMI) myocardial infarction: Secondary | ICD-10-CM | POA: Diagnosis not present

## 2020-09-08 DIAGNOSIS — D649 Anemia, unspecified: Secondary | ICD-10-CM | POA: Diagnosis not present

## 2020-09-08 DIAGNOSIS — Z48811 Encounter for surgical aftercare following surgery on the nervous system: Secondary | ICD-10-CM | POA: Diagnosis not present

## 2020-09-08 DIAGNOSIS — R6510 Systemic inflammatory response syndrome (SIRS) of non-infectious origin without acute organ dysfunction: Secondary | ICD-10-CM | POA: Diagnosis not present

## 2020-09-08 DIAGNOSIS — Z7189 Other specified counseling: Secondary | ICD-10-CM | POA: Diagnosis not present

## 2020-09-08 DIAGNOSIS — R7989 Other specified abnormal findings of blood chemistry: Secondary | ICD-10-CM | POA: Diagnosis not present

## 2020-09-08 DIAGNOSIS — M1711 Unilateral primary osteoarthritis, right knee: Secondary | ICD-10-CM | POA: Diagnosis not present

## 2020-09-11 DIAGNOSIS — I10 Essential (primary) hypertension: Secondary | ICD-10-CM | POA: Diagnosis not present

## 2020-09-11 DIAGNOSIS — M961 Postlaminectomy syndrome, not elsewhere classified: Secondary | ICD-10-CM | POA: Diagnosis not present

## 2020-09-11 DIAGNOSIS — U071 COVID-19: Secondary | ICD-10-CM | POA: Diagnosis not present

## 2020-09-11 DIAGNOSIS — I5021 Acute systolic (congestive) heart failure: Secondary | ICD-10-CM | POA: Diagnosis not present

## 2020-09-11 DIAGNOSIS — Z7189 Other specified counseling: Secondary | ICD-10-CM | POA: Diagnosis not present

## 2020-09-15 ENCOUNTER — Ambulatory Visit: Payer: Medicare HMO | Admitting: Cardiology

## 2020-09-18 DIAGNOSIS — R7989 Other specified abnormal findings of blood chemistry: Secondary | ICD-10-CM | POA: Diagnosis not present

## 2020-09-18 DIAGNOSIS — D649 Anemia, unspecified: Secondary | ICD-10-CM | POA: Diagnosis not present

## 2020-09-18 DIAGNOSIS — U071 COVID-19: Secondary | ICD-10-CM | POA: Diagnosis not present

## 2020-09-20 DIAGNOSIS — M48061 Spinal stenosis, lumbar region without neurogenic claudication: Secondary | ICD-10-CM | POA: Diagnosis not present

## 2020-09-20 DIAGNOSIS — R6510 Systemic inflammatory response syndrome (SIRS) of non-infectious origin without acute organ dysfunction: Secondary | ICD-10-CM | POA: Diagnosis not present

## 2020-09-20 DIAGNOSIS — Z48811 Encounter for surgical aftercare following surgery on the nervous system: Secondary | ICD-10-CM | POA: Diagnosis not present

## 2020-09-20 DIAGNOSIS — G464 Cerebellar stroke syndrome: Secondary | ICD-10-CM | POA: Diagnosis not present

## 2020-09-20 DIAGNOSIS — N179 Acute kidney failure, unspecified: Secondary | ICD-10-CM | POA: Diagnosis not present

## 2020-09-20 DIAGNOSIS — M1711 Unilateral primary osteoarthritis, right knee: Secondary | ICD-10-CM | POA: Diagnosis not present

## 2020-09-20 DIAGNOSIS — U071 COVID-19: Secondary | ICD-10-CM | POA: Diagnosis not present

## 2020-09-20 DIAGNOSIS — I214 Non-ST elevation (NSTEMI) myocardial infarction: Secondary | ICD-10-CM | POA: Diagnosis not present

## 2020-09-20 DIAGNOSIS — M961 Postlaminectomy syndrome, not elsewhere classified: Secondary | ICD-10-CM | POA: Diagnosis not present

## 2020-09-20 DIAGNOSIS — I480 Paroxysmal atrial fibrillation: Secondary | ICD-10-CM | POA: Diagnosis not present

## 2020-09-20 DIAGNOSIS — R5381 Other malaise: Secondary | ICD-10-CM | POA: Diagnosis not present

## 2020-09-20 DIAGNOSIS — I5021 Acute systolic (congestive) heart failure: Secondary | ICD-10-CM | POA: Diagnosis not present

## 2020-09-22 DIAGNOSIS — I214 Non-ST elevation (NSTEMI) myocardial infarction: Secondary | ICD-10-CM | POA: Diagnosis not present

## 2020-09-22 DIAGNOSIS — R5381 Other malaise: Secondary | ICD-10-CM | POA: Diagnosis not present

## 2020-09-22 DIAGNOSIS — U071 COVID-19: Secondary | ICD-10-CM | POA: Diagnosis not present

## 2020-09-22 DIAGNOSIS — M48061 Spinal stenosis, lumbar region without neurogenic claudication: Secondary | ICD-10-CM | POA: Diagnosis not present

## 2020-09-25 ENCOUNTER — Encounter (HOSPITAL_COMMUNITY): Payer: Self-pay | Admitting: Emergency Medicine

## 2020-09-25 ENCOUNTER — Other Ambulatory Visit: Payer: Self-pay

## 2020-09-25 ENCOUNTER — Emergency Department (HOSPITAL_COMMUNITY)
Admission: EM | Admit: 2020-09-25 | Discharge: 2020-09-26 | Disposition: A | Discharge status: Home / Self Care | Payer: Medicare HMO | Attending: Emergency Medicine | Admitting: Emergency Medicine

## 2020-09-25 DIAGNOSIS — I129 Hypertensive chronic kidney disease with stage 1 through stage 4 chronic kidney disease, or unspecified chronic kidney disease: Secondary | ICD-10-CM | POA: Insufficient documentation

## 2020-09-25 DIAGNOSIS — M1711 Unilateral primary osteoarthritis, right knee: Secondary | ICD-10-CM | POA: Diagnosis not present

## 2020-09-25 DIAGNOSIS — Z7982 Long term (current) use of aspirin: Secondary | ICD-10-CM | POA: Diagnosis not present

## 2020-09-25 DIAGNOSIS — M961 Postlaminectomy syndrome, not elsewhere classified: Secondary | ICD-10-CM | POA: Diagnosis not present

## 2020-09-25 DIAGNOSIS — I2510 Atherosclerotic heart disease of native coronary artery without angina pectoris: Secondary | ICD-10-CM | POA: Diagnosis not present

## 2020-09-25 DIAGNOSIS — I110 Hypertensive heart disease with heart failure: Secondary | ICD-10-CM | POA: Diagnosis not present

## 2020-09-25 DIAGNOSIS — M5459 Other low back pain: Secondary | ICD-10-CM | POA: Diagnosis not present

## 2020-09-25 DIAGNOSIS — Z79899 Other long term (current) drug therapy: Secondary | ICD-10-CM | POA: Insufficient documentation

## 2020-09-25 DIAGNOSIS — I252 Old myocardial infarction: Secondary | ICD-10-CM | POA: Diagnosis not present

## 2020-09-25 DIAGNOSIS — I480 Paroxysmal atrial fibrillation: Secondary | ICD-10-CM | POA: Diagnosis not present

## 2020-09-25 DIAGNOSIS — M545 Low back pain, unspecified: Secondary | ICD-10-CM | POA: Diagnosis present

## 2020-09-25 DIAGNOSIS — N184 Chronic kidney disease, stage 4 (severe): Secondary | ICD-10-CM | POA: Insufficient documentation

## 2020-09-25 DIAGNOSIS — R32 Unspecified urinary incontinence: Secondary | ICD-10-CM | POA: Diagnosis not present

## 2020-09-25 DIAGNOSIS — E119 Type 2 diabetes mellitus without complications: Secondary | ICD-10-CM | POA: Insufficient documentation

## 2020-09-25 DIAGNOSIS — U071 COVID-19: Secondary | ICD-10-CM | POA: Diagnosis not present

## 2020-09-25 DIAGNOSIS — J811 Chronic pulmonary edema: Secondary | ICD-10-CM | POA: Diagnosis not present

## 2020-09-25 DIAGNOSIS — I5020 Unspecified systolic (congestive) heart failure: Secondary | ICD-10-CM | POA: Diagnosis not present

## 2020-09-25 LAB — CBC WITH DIFFERENTIAL/PLATELET
Abs Immature Granulocytes: 0.02 10*3/uL (ref 0.00–0.07)
Basophils Absolute: 0 10*3/uL (ref 0.0–0.1)
Basophils Relative: 1 %
Eosinophils Absolute: 0.1 10*3/uL (ref 0.0–0.5)
Eosinophils Relative: 2 %
HCT: 31 % — ABNORMAL LOW (ref 36.0–46.0)
Hemoglobin: 9.3 g/dL — ABNORMAL LOW (ref 12.0–15.0)
Immature Granulocytes: 0 %
Lymphocytes Relative: 28 %
Lymphs Abs: 2 10*3/uL (ref 0.7–4.0)
MCH: 29.2 pg (ref 26.0–34.0)
MCHC: 30 g/dL (ref 30.0–36.0)
MCV: 97.5 fL (ref 80.0–100.0)
Monocytes Absolute: 1 10*3/uL (ref 0.1–1.0)
Monocytes Relative: 14 %
Neutro Abs: 4 10*3/uL (ref 1.7–7.7)
Neutrophils Relative %: 55 %
Platelets: 266 10*3/uL (ref 150–400)
RBC: 3.18 MIL/uL — ABNORMAL LOW (ref 3.87–5.11)
RDW: 14.2 % (ref 11.5–15.5)
WBC: 7.2 10*3/uL (ref 4.0–10.5)
nRBC: 0 % (ref 0.0–0.2)

## 2020-09-25 LAB — COMPREHENSIVE METABOLIC PANEL
ALT: 12 U/L (ref 0–44)
AST: 20 U/L (ref 15–41)
Albumin: 3.5 g/dL (ref 3.5–5.0)
Alkaline Phosphatase: 55 U/L (ref 38–126)
Anion gap: 11 (ref 5–15)
BUN: 24 mg/dL — ABNORMAL HIGH (ref 8–23)
CO2: 31 mmol/L (ref 22–32)
Calcium: 9.2 mg/dL (ref 8.9–10.3)
Chloride: 95 mmol/L — ABNORMAL LOW (ref 98–111)
Creatinine, Ser: 2.08 mg/dL — ABNORMAL HIGH (ref 0.44–1.00)
GFR, Estimated: 22 mL/min — ABNORMAL LOW (ref >60)
Glucose, Bld: 207 mg/dL — ABNORMAL HIGH (ref 70–99)
Potassium: 3.9 mmol/L (ref 3.5–5.1)
Sodium: 137 mmol/L (ref 135–145)
Total Bilirubin: 0.4 mg/dL (ref 0.3–1.2)
Total Protein: 6.6 g/dL (ref 6.5–8.1)

## 2020-09-25 LAB — URINALYSIS, ROUTINE W REFLEX MICROSCOPIC
Bilirubin Urine: NEGATIVE
Glucose, UA: NEGATIVE mg/dL
Hgb urine dipstick: NEGATIVE
Ketones, ur: NEGATIVE mg/dL
Leukocytes,Ua: NEGATIVE
Nitrite: NEGATIVE
Protein, ur: NEGATIVE mg/dL
Specific Gravity, Urine: 1.013 (ref 1.005–1.030)
pH: 5 (ref 5.0–8.0)

## 2020-09-25 NOTE — ED Triage Notes (Signed)
Patient reports pain at lower back this week , denies injury or urinary discomfort , patient stated back surgery last month .

## 2020-09-26 MED ORDER — HYDROCODONE-ACETAMINOPHEN 5-325 MG PO TABS: 1.0000 | ORAL_TABLET | Freq: Four times a day (QID) | ORAL | 0 refills | Status: DC | PRN

## 2020-09-26 MED ORDER — OXYCODONE-ACETAMINOPHEN 5-325 MG PO TABS
1.0000 | ORAL_TABLET | Freq: Once | ORAL | Status: AC
  Administered 2020-09-26: 1 via ORAL
  Filled 2020-09-26: qty 1

## 2020-09-26 NOTE — Discharge Instructions (Signed)
You were seen today for back pain.  Follow-up with your neurosurgeon.  You develop worsening pain, numbness, weakness, fevers, you should be reevaluated.

## 2020-09-26 NOTE — ED Provider Notes (Signed)
[name] Provider Note   CSN: [id number] Arrival date & time: 09/25/20  1842     History Chief Complaint  Patient presents with  . Back Pain    Kristin [name] is a 79 y.o. female.  HPI     This is an 79 year old female with a history of diabetes, hypertension, hypercholesterolemia who presents with back pain.  Patient reports onset of bilateral lower back pain while she was at rehab this week.  Denies injury or heavy lifting.  Pain is nonradiating.  It is ever bilateral lower back.  No midline pain.  Patient denies any bowel or bladder difficulty.  She denies weakness, numbness, tingling of the lower extremities.  No recent fevers.  She has not taken anything for her pain.  She rates her pain at 10 out of 10.  She had laminectomy in early September by Dr. Reatha Armour.  She recently reports having her sutures removed and is due to follow-up in clinic later this week.  Past Medical History:  Diagnosis Date  . Diabetes mellitus without complication ([place])   . Hypercholesteremia   . Hypertension   . Renal disorder    CKD Stage IV    Patient Active Problem List   Diagnosis Date Noted  . Cerebral embolism with cerebral infarction 08/28/2020  . 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 AKI (acute kidney injury) ([place])   . Atherosclerosis of native coronary artery of native heart without angina pectoris   . NSTEMI (non-ST elevated myocardial infarction) ([place])   . Atrial fibrillation ([place])   . Mixed hyperlipidemia   . Elevated troponin   . Pulmonary edema   . Acute HFrEF (heart failure with reduced ejection fraction) ([place])   . Cardiomyopathy ([place])   . Effusion of right knee joint   . SIRS (systemic inflammatory response syndrome) ([place]) 08/18/2020  . Lower extremity weakness 08/18/2020  . Hypertension   . Chronic low back pain with bilateral sciatica     Past Surgical History:  Procedure Laterality  Date  . LUMBAR LAMINECTOMY/DECOMPRESSION MICRODISCECTOMY N/A 08/19/2020   Procedure: LUMBAR LAMINECTOMY/DECOMPRESSION  Lumbar two-three, Lumbar three-four;  Surgeon: Dawley, Theodoro Doing, DO;  Location: [facility];  Service: Neurosurgery;  Laterality: N/A;  . RIGHT/LEFT HEART CATH AND CORONARY ANGIOGRAPHY N/A 08/23/2020   Procedure: RIGHT/LEFT HEART CATH AND CORONARY ANGIOGRAPHY;  Surgeon: [name], MD;  Location: [facility];  Service: Cardiovascular;  Laterality: N/A;     OB History   No obstetric history on file.     Family History  Problem Relation Age of Onset  . Diabetes Father     Social History   Tobacco Use  . Smoking status: Never Smoker  . Smokeless tobacco: Never Used  Vaping Use  . Vaping Use: Never used  Substance Use Topics  . Alcohol use: Never  . Drug use: Never    Home Medications Prior to Admission medications   Medication Sig Start Date End Date Taking? Authorizing Provider  amiodarone (PACERONE) 200 MG tablet Take 1 tablet (200 mg total) by mouth daily. 08/31/20   Aline August, MD  apixaban (ELIQUIS) 2.5 MG TABS tablet Take 1 tablet (2.5 mg total) by mouth 2 (two) times daily. 08/30/20   Aline August, MD  aspirin 81 MG chewable tablet Chew 1 tablet (81 mg total) by mouth daily. 08/31/20   Aline August, MD  bumetanide (BUMEX) 1 MG tablet Take 1 tablet (1 mg  total) by mouth 2 (two) times daily. 08/30/20   Aline August, MD  cholecalciferol (VITAMIN D) 25 MCG (1000 UNIT) tablet Take 3,000 Units by mouth daily.    [provider]  colchicine 0.6 MG tablet Take 0.5 tablets (0.3 mg total) by mouth daily. 08/31/20   Aline August, MD  diclofenac Sodium (VOLTAREN) 1 % GEL Apply 2 g topically 4 (four) times daily. 08/30/20   Aline August, MD  HYDROcodone-acetaminophen (NORCO/VICODIN) 5-325 MG tablet Take 1 tablet by mouth every 6 (six) hours as needed. 09/26/20   Lymon Kidney, Barbette Hair, MD  isosorbide-hydrALAZINE (BIDIL) 20-37.5 MG tablet Take 2 tablets by  mouth 3 (three) times daily. 08/30/20   Aline August, MD  methocarbamol (ROBAXIN) 500 MG tablet Take 1 tablet (500 mg total) by mouth every 6 (six) hours as needed for muscle spasms. 08/30/20   Aline August, MD  metoprolol tartrate (LOPRESSOR) 50 MG tablet Take 1 tablet (50 mg total) by mouth 2 (two) times daily. 08/30/20   Aline August, MD  Misc. Devices MISC Please dispense one power stair lift for at home daily use prn debility. 03/15/20   Nicolette Bang, DO  nitroGLYCERIN (NITROSTAT) 0.4 MG SL tablet Place 1 tablet (0.4 mg total) under the tongue every 5 (five) minutes as needed for chest pain. 08/30/20   Aline August, MD  ondansetron (ZOFRAN) 4 MG tablet Take 1 tablet (4 mg total) by mouth every 6 (six) hours as needed for nausea. 08/30/20   Aline August, MD  pantoprazole (PROTONIX) 40 MG tablet Take 1 tablet (40 mg total) by mouth daily. 08/30/20   Aline August, MD  polyethylene glycol (MIRALAX / GLYCOLAX) 17 g packet Take 17 g by mouth daily as needed for moderate constipation. 08/30/20   Aline August, MD  rosuvastatin (CRESTOR) 20 MG tablet Take 1 tablet (20 mg total) by mouth at bedtime. 03/16/20   Nicolette Bang, DO    Allergies    Patient has no known allergies.  Review of Systems   Review of Systems  Constitutional: Negative for fever.  Respiratory: Negative for shortness of breath.   Cardiovascular: Negative for chest pain.  Gastrointestinal: Negative for abdominal pain, nausea and vomiting.  Genitourinary: Negative for difficulty urinating and dysuria.  Musculoskeletal: Positive for back pain.  Neurological: Negative for weakness and numbness.  All other systems reviewed and are negative.   Physical Exam Updated Vital Signs BP 135/75   Pulse 70   Temp 98.7 F (37.1 C) (Oral)   Resp 17   Ht 1.626 m (5\' 4" )   Wt 90 kg   SpO2 97%   BMI 34.06 kg/m   Physical Exam Vitals and nursing note reviewed.  Constitutional:      Appearance: She is  well-developed.     Comments: Elderly, overweight, no acute distress  HENT:     Head: Normocephalic and atraumatic.     Nose: Nose normal.     Mouth/Throat:     Mouth: Mucous membranes are moist.  Eyes:     Pupils: Pupils are equal, round, and reactive to light.  Cardiovascular:     Rate and Rhythm: Normal rate and regular rhythm.     Heart sounds: Normal heart sounds.  Pulmonary:     Effort: Pulmonary effort is normal. No respiratory distress.     Breath sounds: No wheezing.  Abdominal:     Palpations: Abdomen is soft.     Tenderness: There is no abdominal tenderness.  Musculoskeletal:  Cervical back: Neck supple.     Comments: Lower lumbar incision, clean dry and intact, no overlying skin changes, no dehiscence slight tenderness to palpation adjacent to the room without fluctuance or induration, tenderness to palpation over the bilateral SI joints, no muscle spasm noted  Skin:    General: Skin is warm and dry.  Neurological:     Mental Status: She is alert and oriented to person, place, and time.     Comments: 5 out of 5 strength bilateral lower extremities, no drift, normal reflexes bilaterally  Psychiatric:        Mood and Affect: Mood normal.     ED Results / Procedures / Treatments   Labs (all labs ordered are listed, but only abnormal results are displayed) Labs Reviewed  CBC WITH DIFFERENTIAL/PLATELET - Abnormal; Notable for the following components:      Result Value   RBC 3.18 (*)    Hemoglobin 9.3 (*)    HCT 31.0 (*)    All other components within normal limits  COMPREHENSIVE METABOLIC PANEL - Abnormal; Notable for the following components:   Chloride 95 (*)    Glucose, Bld 207 (*)    BUN 24 (*)    Creatinine, Ser 2.08 (*)    GFR, Estimated 22 (*)    All other components within normal limits  URINALYSIS, ROUTINE W REFLEX MICROSCOPIC - Abnormal; Notable for the following components:   APPearance CLOUDY (*)    All other components within normal limits     EKG None  Radiology No results found.  Procedures Procedures (including critical care time)  Medications Ordered in ED Medications  oxyCODONE-acetaminophen (PERCOCET/ROXICET) 5-325 MG per tablet 1 tablet (1 tablet Oral Given 09/26/20 1610)    ED Course  I have reviewed the triage vital signs and the nursing notes.  Pertinent labs & imaging results that were available during my care of the patient were reviewed by me and considered in my medical decision making (see chart for details).    MDM Rules/Calculators/A&P                          Patient presents with back pain.  She is overall nontoxic-appearing vital signs are reassuring.  She recently had surgery.  Her surgical incision is clean dry and intact.  No obvious infection.  Her pain is more over her bilateral joints.  She is in rehab.  She has no red flags or signs or symptoms of cauda equina.  She is neurologically intact.  Patient was given pain medication.  She is not able to take anti-inflammatories because of her creatinine.  Given location of pain and absence of injury, do not feel she needs advanced imaging at this time.  Would have low suspicion for infectious etiology or fracture.  On recheck, she states she feels much better after pain management.  Will send home with a short course of Norco.  She states that she has follow-up with neurosurgery later in the week.  Recommend maintaining follow-up as scheduled.  After history, exam, and medical workup I feel the patient has been appropriately medically screened and is safe for discharge home. Pertinent diagnoses were discussed with the patient. Patient was given return precautions.  Final Clinical Impression(s) / ED Diagnoses Final diagnoses:  Acute bilateral low back pain without sciatica    Rx / DC Orders ED Discharge Orders         Ordered    HYDROcodone-acetaminophen (NORCO/VICODIN) 5-325  MG tablet  Every 6 hours PRN        09/26/20 0714            Merryl Hacker, MD 09/27/20 [phone]

## 2020-09-28 DIAGNOSIS — E119 Type 2 diabetes mellitus without complications: Secondary | ICD-10-CM | POA: Diagnosis not present

## 2020-09-28 DIAGNOSIS — I252 Old myocardial infarction: Secondary | ICD-10-CM | POA: Diagnosis not present

## 2020-09-28 DIAGNOSIS — J811 Chronic pulmonary edema: Secondary | ICD-10-CM | POA: Diagnosis not present

## 2020-09-28 DIAGNOSIS — I480 Paroxysmal atrial fibrillation: Secondary | ICD-10-CM | POA: Diagnosis not present

## 2020-09-28 DIAGNOSIS — U071 COVID-19: Secondary | ICD-10-CM | POA: Diagnosis not present

## 2020-09-28 DIAGNOSIS — M961 Postlaminectomy syndrome, not elsewhere classified: Secondary | ICD-10-CM | POA: Diagnosis not present

## 2020-09-28 DIAGNOSIS — R32 Unspecified urinary incontinence: Secondary | ICD-10-CM | POA: Diagnosis not present

## 2020-09-28 DIAGNOSIS — I110 Hypertensive heart disease with heart failure: Secondary | ICD-10-CM | POA: Diagnosis not present

## 2020-09-28 DIAGNOSIS — I5020 Unspecified systolic (congestive) heart failure: Secondary | ICD-10-CM | POA: Diagnosis not present

## 2020-09-28 DIAGNOSIS — M1711 Unilateral primary osteoarthritis, right knee: Secondary | ICD-10-CM | POA: Diagnosis not present

## 2020-09-29 ENCOUNTER — Ambulatory Visit: Payer: Medicare HMO | Admitting: Cardiology

## 2020-09-29 ENCOUNTER — Encounter: Payer: Self-pay | Admitting: Cardiology

## 2020-09-29 ENCOUNTER — Other Ambulatory Visit: Payer: Self-pay

## 2020-09-29 VITALS — BP 124/69 | HR 70 | Resp 16 | Ht 64.0 in | Wt 194.0 lb

## 2020-09-29 DIAGNOSIS — Z7901 Long term (current) use of anticoagulants: Secondary | ICD-10-CM | POA: Diagnosis not present

## 2020-09-29 DIAGNOSIS — I255 Ischemic cardiomyopathy: Secondary | ICD-10-CM

## 2020-09-29 DIAGNOSIS — Z8673 Personal history of transient ischemic attack (TIA), and cerebral infarction without residual deficits: Secondary | ICD-10-CM

## 2020-09-29 DIAGNOSIS — Z79899 Other long term (current) drug therapy: Secondary | ICD-10-CM

## 2020-09-29 DIAGNOSIS — E782 Mixed hyperlipidemia: Secondary | ICD-10-CM

## 2020-09-29 DIAGNOSIS — I5022 Chronic systolic (congestive) heart failure: Secondary | ICD-10-CM

## 2020-09-29 DIAGNOSIS — E1122 Type 2 diabetes mellitus with diabetic chronic kidney disease: Secondary | ICD-10-CM

## 2020-09-29 DIAGNOSIS — I480 Paroxysmal atrial fibrillation: Secondary | ICD-10-CM

## 2020-09-29 DIAGNOSIS — I252 Old myocardial infarction: Secondary | ICD-10-CM

## 2020-09-29 DIAGNOSIS — I2510 Atherosclerotic heart disease of native coronary artery without angina pectoris: Secondary | ICD-10-CM | POA: Diagnosis not present

## 2020-09-29 DIAGNOSIS — N184 Chronic kidney disease, stage 4 (severe): Secondary | ICD-10-CM

## 2020-09-29 MED ORDER — NITROGLYCERIN 0.4 MG SL SUBL: 0.4000 mg | SUBLINGUAL_TABLET | SUBLINGUAL | 0 refills | Status: DC | PRN

## 2020-09-29 NOTE — Progress Notes (Signed)
Kristin [name] Date of Birth: Jan 04, 1941 MRN: [id number] Primary Care Provider:Hagler, Apolonio Schneiders, MD Primary Cardiologist: Rex Kras, DO, [facility] (established care 08/22/2020)  Date: 09/29/20 Last Visit: 08/25/2020  Chief Complaint  Patient presents with  . Atrial Fibrillation    hospital follow up  . Congestive Heart Failure  . New Patient (Initial Visit)    HPI  Kristin [name] is a 79 y.o.  female who presents to the office with a chief complaint of " hospital follow-up and management of congestive heart failure, CAD and atrial fibrillation." Patient's past medical history and cardiovascular risk factors include: Paroxysmal atrial fibrillation, NSTEMI (08/2020), multivessel CAD, ischemic cardiomyopathy, chronic heart failure with reduced EF/stage C/NYHA class II, acute cerebellar stroke, lumbar stenosis status post laminectomy, hypertension, CKD stage IV, diabetes miellitus type 2, postmenopausal female, advanced age.   Patient presents to the office accompanied by her daughter Kristin [name].   Patient recently presented to [facility] in September with a chief complaint of bilateral lower extremity weakness and back pain.  She underwent imaging and was noted to have lumbar stenosis and underwent L2-L4 laminectomy on 08/19/2020.  She was doing well postoperatively until August 22, 2020 she started having symptoms of shortness of breath, chest x-ray noted pulmonary edema, EKG notable for new onset of atrial fibrillation, and troponins greater than 27,000 ruled in for non-STEMI.  Given the recent neurosurgery the surgical team recommended holding off full IV anticoagulation or DOAC's given the risk of bleeding.  Patient was focused on rate control therapy and spontaneously converted to normal sinus rhythm.  She also underwent left heart catheterization and was found to have multivessel CAD no interventions were performed as that she would not be a candidate for dual antiplatelet therapy  for the same reasons noted above.  The plan was to treat her medically.  During the hospitalization patient was doing well until she started experiencing dizziness underwent additional imaging and was found to have acute cerebellar stroke.  Patient was discharged on 08/30/2020.  And now presents to the office for follow-up.  After 1 month status post discharge patient states that she is doing well from a cardiovascular standpoint.  She denies any chest pain at rest or with effort related activities.  No dyspnea on exertion.  Patient does have residual lightheadedness/dizziness even while she is still.  She is currently getting home physical therapy and Occupational Therapy.  FUNCTIONAL STATUS: No structured exercise program or daily routine.   ALLERGIES: No Known Allergies  MEDICATION LIST PRIOR TO VISIT: Current Outpatient Medications on File Prior to Visit  Medication Sig Dispense Refill  . amLODipine (NORVASC) 10 MG tablet Take 10 mg by mouth daily.    Marland Kitchen apixaban (ELIQUIS) 2.5 MG TABS tablet Take 1 tablet (2.5 mg total) by mouth 2 (two) times daily. 60 tablet 0  . cholecalciferol (VITAMIN D) 25 MCG (1000 UNIT) tablet Take 3,000 Units by mouth daily.    . colchicine 0.6 MG tablet Take 0.5 tablets (0.3 mg total) by mouth daily. 15 tablet 0  . diclofenac Sodium (VOLTAREN) 1 % GEL Apply 2 g topically 4 (four) times daily.    Marland Kitchen gabapentin (NEURONTIN) 100 MG capsule Take 200 mg by mouth at bedtime.    Marland Kitchen HYDROcodone-acetaminophen (NORCO/VICODIN) 5-325 MG tablet Take 1 tablet by mouth every 6 (six) hours as needed. 10 tablet 0  . isosorbide-hydrALAZINE (BIDIL) 20-37.5 MG tablet Take 2 tablets by mouth 3 (three) times daily. 90 tablet 0  . methocarbamol (ROBAXIN) 500 MG tablet  Take 1 tablet (500 mg total) by mouth every 6 (six) hours as needed for muscle spasms. 30 tablet 0  . metoprolol succinate (TOPROL-XL) 50 MG 24 hr tablet Take 50 mg by mouth daily.    . Misc. Devices MISC Please dispense one  power stair lift for at home daily use prn debility. 1 Device 0  . pantoprazole (PROTONIX) 40 MG tablet Take 1 tablet (40 mg total) by mouth daily. 30 tablet 0  . polyethylene glycol (MIRALAX / GLYCOLAX) 17 g packet Take 17 g by mouth daily as needed for moderate constipation. 14 each 0  . rosuvastatin (CRESTOR) 20 MG tablet Take 1 tablet (20 mg total) by mouth at bedtime. 90 tablet 1  . amiodarone (PACERONE) 200 MG tablet Take 1 tablet (200 mg total) by mouth daily. (Patient not taking: Reported on 09/29/2020) 30 tablet 0  . bumetanide (BUMEX) 1 MG tablet Take 1 tablet (1 mg total) by mouth 2 (two) times daily. 60 tablet 0   No current facility-administered medications on file prior to visit.    PAST MEDICAL HISTORY: Past Medical History:  Diagnosis Date  . Atrial fibrillation ([place])   . CHF (congestive heart failure) ([place])   . Coronary artery disease   . Diabetes mellitus without complication ([place])   . Hypercholesteremia   . Hypertension   . Renal disorder    CKD Stage IV  . Stroke [facility])     PAST SURGICAL HISTORY: Past Surgical History:  Procedure Laterality Date  . LUMBAR LAMINECTOMY/DECOMPRESSION MICRODISCECTOMY N/A 08/19/2020   Procedure: LUMBAR LAMINECTOMY/DECOMPRESSION  Lumbar two-three, Lumbar three-four;  Surgeon: Dawley, Theodoro Doing, DO;  Location: [place];  Service: Neurosurgery;  Laterality: N/A;  . RIGHT/LEFT HEART CATH AND CORONARY ANGIOGRAPHY N/A 08/23/2020   Procedure: RIGHT/LEFT HEART CATH AND CORONARY ANGIOGRAPHY;  Surgeon: [name], MD;  Location: [place] [facility];  Service: Cardiovascular;  Laterality: N/A;    FAMILY HISTORY: The patient's family history includes Diabetes in her father.   SOCIAL HISTORY:  The patient  reports that she has never smoked. She has never used smokeless tobacco. She reports that she does not drink alcohol and does not use drugs.  Review of Systems  Constitutional: Negative for chills and fever.  HENT: Negative for hoarse voice and  nosebleeds.   Eyes: Negative for discharge, double vision and pain.  Cardiovascular: Negative for chest pain, claudication, dyspnea on exertion, leg swelling, near-syncope, orthopnea, palpitations, paroxysmal nocturnal dyspnea and syncope.  Respiratory: Negative for hemoptysis and shortness of breath.   Musculoskeletal: Negative for muscle cramps and myalgias.       Unsteady with gait, but no falls.   Gastrointestinal: Negative for abdominal pain, constipation, diarrhea, hematemesis, hematochezia, melena, nausea and vomiting.  Neurological: Positive for dizziness and light-headedness.    PHYSICAL EXAM: Vitals with BMI 09/29/2020 09/26/2020 09/26/2020  Height 5\' 4"  - -  Weight 194 lbs - -  BMI 74.25 - -  Systolic 956 387 564  Diastolic 69 75 76  Pulse 70 70 70   Orthostatic VS for the past 72 hrs (Last 3 readings):  Orthostatic BP Patient Position BP Location Cuff Size Orthostatic Pulse  09/29/20 1514 110/62 Standing Left Arm Large 73  09/29/20 1513 117/69 Sitting Left Arm Large 73  09/29/20 1512 124/79 Supine Left Arm Large 69    CONSTITUTIONAL: Age-appropriate female, hemodynamically stable, no acute distress.  SKIN: Skin is warm and dry. No rash noted. No cyanosis. No pallor. No jaundice HEAD: Normocephalic and atraumatic.  EYES: No scleral icterus MOUTH/THROAT: Moist oral membranes.  NECK: No JVD present. No thyromegaly noted. No carotid bruits  LYMPHATIC: No visible cervical adenopathy.  CHEST Normal respiratory effort. No intercostal retractions  LUNGS: Clear to auscultation bilaterally.  No stridor. No wheezes. No rales.  CARDIOVASCULAR: Regular rate and rhythm, positive Q2-V9, soft holosystolic murmur heard at the apex, no rubs or gallops appreciated ABDOMINAL: Obese, soft, nontender, nondistended, positive bowel sounds in all 4 quadrants, no apparent ascites.  EXTREMITIES: No peripheral edema  HEMATOLOGIC: No significant bruising NEUROLOGIC: Oriented to person, place,  and time. Nonfocal. Normal muscle tone.  PSYCHIATRIC: Normal mood and affect. Normal behavior. Cooperative  CARDIAC DATABASE: EKG: 09/29/2020: Normal sinus rhythm, 71 bpm, old anteroseptal infarct, nonspecific T wave abnormality, without underlying injury pattern.  Echocardiogram: 08/22/2020: 1. Left ventricular ejection fraction, by estimation, is 30 to 35%. The left ventricle has moderately decreased function. The left ventricle demonstrates regional wall motion abnormalities. Hyokinesis of base to mid anterior wall, base to mid anterospetal wall, apical septal, and apex. There is moderate left ventricular hypertrophy. Diastolic function is not assessed due to atrial fibrillation. No apparent left ventricular thrombus. 2. Right ventricular systolic function is normal. The right ventricular size is normal. There is moderately elevated pulmonary artery systolic pressure. The estimated right ventricular systolic pressure is 56.3 mmHg. 3. The mitral valve is normal in structure. Mild mitral valve regurgitation. No evidence of mitral stenosis. 4. The aortic valve is normal in structure. Aortic valve regurgitation is not visualized. Mild aortic valve sclerosis is present, with no evidence of aortic valve stenosis. 5. The inferior vena cava is dilated in size with >50% respiratory variability, suggesting right atrial pressure of 8 mmHg.  Stress Testing:  No results found for this or any previous visit from the past 1095 days.  Heart Catheterization: Left and right heart catheterization 08/23/2020: RA 13/15, mean 12, RA saturation 56%; RV 51/8, EDP 12 mmHg; PA 54/26, mean 38 mmHg, PA saturation 45%; PW 21/21, mean 20 mmHg, aortic saturation 97%. Cardiac output 4.96, cardiac index 2.51.  SVR elevated at 1258.  PVR mildly elevated at 290.  QP/QS 0.79.  Left main: Mild disease. LAD: Proximal 80% stenosis followed by 100% occlusion.  Before the occlusion, at the subtotal site, there is a large  diagonal that comes off and has secondary branches.  Apical LAD is barely visualized with minimal amount of collaterals.  Thrombus is evident at the site of occlusion. Circumflex: Large vessel, moderate-sized OM1 and moderate-sized OM 2.  Minimal disease. RCA: Ostial 80 to 90% stenosis.  Mild disease in the remaining of the vessel, PL branch has a 50% stenosis of the proximal and ostial segment.  Severe dampening was evident with engagement of 5 French diagnostic catheter. LV: EDP mildly elevated at 15- 18 mmHg.  No pressure gradient across the aortic valve.  LVEF 30 to 35% with entire anterior, anterolateral, apical and apical inferior akinesis.  No significant MR.  LABORATORY DATA: CBC Latest Ref Rng & Units 09/25/2020 08/28/2020 08/27/2020  WBC 4.0 - 10.5 K/uL 7.2 10.7(H) 12.3(H)  Hemoglobin 12.0 - 15.0 g/dL 9.3(L) 8.7(L) 8.9(L)  Hematocrit 36 - 46 % 31.0(L) 27.9(L) 27.8(L)  Platelets 150 - 400 K/uL 266 387 328    CMP Latest Ref Rng & Units 09/25/2020 08/29/2020 08/28/2020  Glucose 70 - 99 mg/dL 207(H) 126(H) 137(H)  BUN 8 - 23 mg/dL 24(H) 33(H) 29(H)  Creatinine 0.44 - 1.00 mg/dL 2.08(H) 1.91(H) 1.79(H)  Sodium 135 - 145 mmol/L 137  136 136  Potassium 3.5 - 5.1 mmol/L 3.9 4.7 4.3  Chloride 98 - 111 mmol/L 95(L) 96(L) 98  CO2 22 - 32 mmol/L 31 23 27   Calcium 8.9 - 10.3 mg/dL 9.2 9.3 9.3  Total Protein 6.5 - 8.1 g/dL 6.6 - -  Total Bilirubin 0.3 - 1.2 mg/dL 0.4 - -  Alkaline Phos 38 - 126 U/L 55 - -  AST 15 - 41 U/L 20 - -  ALT 0 - 44 U/L 12 - -    Lipid Panel     Component Value Date/Time   CHOL 130 08/29/2020 0250   TRIG 89 08/29/2020 0250   HDL 47 08/29/2020 0250   CHOLHDL 2.8 08/29/2020 0250   VLDL 18 08/29/2020 0250   LDLCALC 65 08/29/2020 0250    Lab Results  Component Value Date   HGBA1C 6.7 (H) 08/27/2020   No components found for: NTPROBNP Lab Results  Component Value Date   TSH 1.054 08/22/2020   TSH 1.190 03/13/2020    Cardiac Panel (last 3 results) No  results for input(s): CKTOTAL, CKMB, TROPONINIHS, RELINDX in the last 72 hours.  IMPRESSION:    ICD-10-CM   1. Paroxysmal atrial fibrillation (HCC)  I48.0 EKG 12-Lead  2. Long term (current) use of anticoagulants  Z79.01   3. Long term current use of antiarrhythmic drug  Z79.899   4. Atherosclerosis of native coronary artery of native heart without angina pectoris  I25.10 nitroGLYCERIN (NITROSTAT) 0.4 MG SL tablet  5. Chronic HFrEF (heart failure with reduced ejection fraction) (HCC)  I50.22   6. Ischemic cardiomyopathy  I25.5   7. Hx of non-ST elevation myocardial infarction (NSTEMI)  I25.2   8. Mixed hyperlipidemia  E78.2   9. Hx of stroke  Z86.73   10. Type 2 diabetes mellitus with stage 4 chronic kidney disease, without long-term current use of insulin [facility])  E11.22    N18.4      RECOMMENDATIONS: Kristin [name] is a 79 y.o. female whose past medical history and cardiovascular risk factors include: Paroxysmal atrial fibrillation, NSTEMI (08/2020), multivessel CAD, ischemic cardiomyopathy, chronic heart failure with reduced EF/stage C/NYHA class II, acute cerebellar stroke, lumbar stenosis status post laminectomy, hypertension, CKD stage IV, diabetes miellitus type 2, postmenopausal female, advanced age.   Paroxysmal atrial fibrillation: Currently normal sinus rhythm. Continue Toprol-XL, amiodarone, and Eliquis.  Long-term oral anticoagulation:  CHA2DS2-VASc SCORE is 9 which correlates to 15.2 % risk of stroke per year (CHF, hypertension, age greater than 50, diabetes, stroke, history of non-STEMI, female).  Reemphasized the risks, benefits, and alternatives to oral anticoagulation.   Patient has been educated on the importance of monitoring for evidence of bleeding which includes but not limited to hemoptysis, hematochezia, melanotic stools, and hematuria. Patient educated on fall precautions and if she was to be injured despite the mechanism of injury she is to seek medical  attention at the closest ER since she is on blood thinners.  Patient understands importance of this because if internal bleeding is not treated in a timely manner it may further lead to morbidity and/or mortality.  Patient voices understanding of these recommendations and provides verbal feedback.  Chronic heart failure with reduced EF, stage C, NYHA class II:  Continue current medical therapy.  Discussed possibly initiation of Entresto; however, given her fragile kidney function patient and her daughter want to hold off initiation of ACE inhibitors/ARB/Arni.  Recommend daily weight check, strict I/O's  Fluid restriction to <2L per day, Na restriction < 2g per  day  Repeat echo in 3 month from 08/22/2020.  Established coronary artery disease without angina pectoris:  Had a detailed discussion with the patient in regards to management of her underlying CAD and ischemic cardiomyopathy.  Since the patient is asymptomatic at the current time and she has chronic kidney disease stage IV would recommend uptitrating guideline directed medical therapy as opposed to invasive angiography at this time and predisposing her to progressive CKD and possible end-stage renal disease on dialysis.  Medications reconciled.  Sublingual nitroglycerin tablets were refilled.  Recommend aspirin 81 mg p.o. daily.  Type 2 diabetes mellitus, non-insulin-dependent: PCP  Mixed Hyperlipidemia:  . Continue statin therapy.   . Follow lipids. . Currently managed by primary care provider. . Patient denies myalgia or other side effects. . Most recent lipid profile reviewed with the patient. Most recent AST and ALT values reviewed with the patient.  FINAL MEDICATION LIST END OF ENCOUNTER: Meds ordered this encounter  Medications  . nitroGLYCERIN (NITROSTAT) 0.4 MG SL tablet    Sig: Place 1 tablet (0.4 mg total) under the tongue every 5 (five) minutes as needed for chest pain.    Dispense:  30 tablet    Refill:  0     Medications Discontinued During This Encounter  Medication Reason  . metoprolol tartrate (LOPRESSOR) 50 MG tablet Duplicate  . aspirin 81 MG chewable tablet Completed Course  . ondansetron (ZOFRAN) 4 MG tablet Completed Course  . nitroGLYCERIN (NITROSTAT) 0.4 MG SL tablet Reorder     Current Outpatient Medications:  .  amLODipine (NORVASC) 10 MG tablet, Take 10 mg by mouth daily., Disp: , Rfl:  .  apixaban (ELIQUIS) 2.5 MG TABS tablet, Take 1 tablet (2.5 mg total) by mouth 2 (two) times daily., Disp: 60 tablet, Rfl: 0 .  cholecalciferol (VITAMIN D) 25 MCG (1000 UNIT) tablet, Take 3,000 Units by mouth daily., Disp: , Rfl:  .  colchicine 0.6 MG tablet, Take 0.5 tablets (0.3 mg total) by mouth daily., Disp: 15 tablet, Rfl: 0 .  diclofenac Sodium (VOLTAREN) 1 % GEL, Apply 2 g topically 4 (four) times daily., Disp: , Rfl:  .  gabapentin (NEURONTIN) 100 MG capsule, Take 200 mg by mouth at bedtime., Disp: , Rfl:  .  HYDROcodone-acetaminophen (NORCO/VICODIN) 5-325 MG tablet, Take 1 tablet by mouth every 6 (six) hours as needed., Disp: 10 tablet, Rfl: 0 .  isosorbide-hydrALAZINE (BIDIL) 20-37.5 MG tablet, Take 2 tablets by mouth 3 (three) times daily., Disp: 90 tablet, Rfl: 0 .  methocarbamol (ROBAXIN) 500 MG tablet, Take 1 tablet (500 mg total) by mouth every 6 (six) hours as needed for muscle spasms., Disp: 30 tablet, Rfl: 0 .  metoprolol succinate (TOPROL-XL) 50 MG 24 hr tablet, Take 50 mg by mouth daily., Disp: , Rfl:  .  Misc. Devices MISC, Please dispense one power stair lift for at home daily use prn debility., Disp: 1 Device, Rfl: 0 .  pantoprazole (PROTONIX) 40 MG tablet, Take 1 tablet (40 mg total) by mouth daily., Disp: 30 tablet, Rfl: 0 .  polyethylene glycol (MIRALAX / GLYCOLAX) 17 g packet, Take 17 g by mouth daily as needed for moderate constipation., Disp: 14 each, Rfl: 0 .  rosuvastatin (CRESTOR) 20 MG tablet, Take 1 tablet (20 mg total) by mouth at bedtime., Disp: 90 tablet, Rfl:  1 .  amiodarone (PACERONE) 200 MG tablet, Take 1 tablet (200 mg total) by mouth daily. (Patient not taking: Reported on 09/29/2020), Disp: 30 tablet, Rfl: 0 .  bumetanide (  BUMEX) 1 MG tablet, Take 1 tablet (1 mg total) by mouth 2 (two) times daily., Disp: 60 tablet, Rfl: 0 .  nitroGLYCERIN (NITROSTAT) 0.4 MG SL tablet, Place 1 tablet (0.4 mg total) under the tongue every 5 (five) minutes as needed for chest pain., Disp: 30 tablet, Rfl: 0  Orders Placed This Encounter  Procedures  . EKG 12-Lead   --Continue cardiac medications as reconciled in final medication list. --Return in about 4 weeks (around 10/27/2020) for heart failure management.. Or sooner if needed. --Continue follow-up with your primary care physician regarding the management of your other chronic comorbid conditions.  Patient's questions and concerns were addressed to her satisfaction. She voices understanding of the instructions provided during this encounter.   During this visit I reviewed and updated: Tobacco history  allergies medication reconciliation  medical history  surgical history  family history  social history.  This note was created using a voice recognition software as a result there may be grammatical errors inadvertently enclosed that do not reflect the nature of this encounter. Every attempt is made to correct such errors.  Total encounter time 45 minutes. *Total Encounter Time as defined by the Centers for Medicare and Medicaid Services includes, in addition to the face-to-face time of a patient visit (documented in the note above) non-face-to-face time: obtaining and reviewing outside history, ordering and reviewing medications, tests or procedures, care coordination (communications with other health care professionals or caregivers) and documentation in the medical record.   Rex Kras, [place], [facility]  Pager: [phone] Office: [phone]

## 2020-10-02 ENCOUNTER — Telehealth: Payer: Self-pay

## 2020-10-02 NOTE — Telephone Encounter (Signed)
Called pt to inform her to start Aspirin 81mg  QD. Pt underdstood

## 2020-10-02 NOTE — Telephone Encounter (Signed)
-----   Message from [place], [place] sent at 09/29/2020  5:06 PM EDT ----- Regarding: call patient Please call the patient and inform her to start aspirin 81 mg p.o. daily.  You may even speak to her daughter [name].   ST

## 2020-10-03 ENCOUNTER — Encounter: Payer: Self-pay | Admitting: Orthopaedic Surgery

## 2020-10-03 ENCOUNTER — Ambulatory Visit: Payer: Medicare HMO | Admitting: Orthopaedic Surgery

## 2020-10-03 DIAGNOSIS — M48061 Spinal stenosis, lumbar region without neurogenic claudication: Secondary | ICD-10-CM | POA: Diagnosis not present

## 2020-10-03 DIAGNOSIS — G8929 Other chronic pain: Secondary | ICD-10-CM

## 2020-10-03 DIAGNOSIS — M1711 Unilateral primary osteoarthritis, right knee: Secondary | ICD-10-CM

## 2020-10-03 DIAGNOSIS — I5022 Chronic systolic (congestive) heart failure: Secondary | ICD-10-CM | POA: Diagnosis not present

## 2020-10-03 DIAGNOSIS — R5381 Other malaise: Secondary | ICD-10-CM | POA: Diagnosis not present

## 2020-10-03 MED ORDER — LIDOCAINE HCL 1 % IJ SOLN
3.0000 mL | INTRAMUSCULAR | Status: AC | PRN
  Administered 2020-10-03: 3 mL

## 2020-10-03 MED ORDER — METHYLPREDNISOLONE ACETATE 40 MG/ML IJ SUSP
40.0000 mg | INTRAMUSCULAR | Status: AC | PRN
  Administered 2020-10-03: 40 mg via INTRA_ARTICULAR

## 2020-10-03 NOTE — Progress Notes (Signed)
Office Visit Note   Patient: Kristin [name]           Date of Birth: [birth year]           MRN: [id number] Visit Date: 10/03/2020              Requested by: Caren Macadam, MD [address] [place],  [place] [id number] PCP: Caren Macadam, MD   Assessment & Plan: Visit Diagnoses:  1. Chronic pain of right knee   2. Unilateral primary osteoarthritis, right knee     Plan: I recommended a steroid injection in her right knee today.  This can elevate her blood glucose and she will watch this closely.  She is also a candidate for hyaluronic acid given the fact that we will try to do anything we can to temporize her pain into avoid any type of operative intervention.  She has significant medical issues that need to be rectified or stabilized before considering any other type of elective type of surgery such as joint replacement.  All questions and concerns were answered addressed.  They understand this fully.  Follow-Up Instructions: Return in about 4 weeks (around 10/31/2020).   Orders:  No orders of the defined types were placed in this encounter.  No orders of the defined types were placed in this encounter.     Procedures: Large Joint Inj: R knee on 10/03/2020 9:46 AM Indications: diagnostic evaluation and pain Details: 22 G 1.5 in needle, superolateral approach  Arthrogram: No  Medications: 3 mL lidocaine 1 %; 40 mg methylPREDNISolone acetate 40 MG/ML Outcome: tolerated well, no immediate complications Procedure, treatment alternatives, risks and benefits explained, specific risks discussed. Consent was given by the patient. Immediately prior to procedure a time out was called to verify the correct patient, procedure, equipment, support staff and site/side marked as required. Patient was prepped and draped in the usual sterile fashion.       Clinical Data: No additional findings.   Subjective: Chief Complaint  Patient presents with  . Right Knee - Pain  . Left Knee  - Pain  The patient is actually not a new patient.  I saw her last month as a consultation in the hospital.  She had a large knee joint effusion and I was able to aspirate fluid off of the knee.  She does have significant comorbidities and is on Eliquis.  She has had right knee pain for some time now.  Her x-rays of the right knee that were done in the hospital showed end-stage arthritis mainly involving the medial compartment the knee with varus malalignment.  She does ambulate using a cane.  She does report bilateral knee pain but this really the right when the hurts her the most.  HPI  Review of Systems As of today she denies any headache, chest pain, short of breath, fever, chills, nausea, vomiting.  She does report constipation.  She is on Eliquis and hydrocodone for pain.  Objective: Vital Signs: There were no vitals taken for this visit.  Physical Exam She is alert and oriented x3 and in no acute distress Ortho Exam Examination of her right knee shows medial joint line tenderness and varus malalignment with patellofemoral crepitation.  There is good range of motion of the knee.  There is not a large effusion today like when she was in the hospital the knee is not significantly warm. Specialty Comments:  No specialty comments available.  Imaging: No results found.   PMFS History: Patient Active Problem  List   Diagnosis Date Noted  . Unilateral primary osteoarthritis, right knee 10/03/2020  . Cerebral embolism with cerebral infarction 08/28/2020  . 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 AKI (acute kidney injury) ([place])   . Atherosclerosis of native coronary artery of native heart without angina pectoris   . NSTEMI (non-ST elevated myocardial infarction) ([place])   . Atrial fibrillation ([place])   . Mixed hyperlipidemia   . Elevated troponin   . Pulmonary edema   . Acute HFrEF (heart failure with reduced ejection fraction) ([place])   .  Cardiomyopathy ([place])   . Effusion of right knee joint   . SIRS (systemic inflammatory response syndrome) ([place]) 08/18/2020  . Lower extremity weakness 08/18/2020  . Hypertension   . Chronic low back pain with bilateral sciatica    Past Medical History:  Diagnosis Date  . Atrial fibrillation ([place])   . CHF (congestive heart failure) ([place])   . Coronary artery disease   . Diabetes mellitus without complication ([place])   . Hypercholesteremia   . Hypertension   . Renal disorder    CKD Stage IV  . Stroke [facility])     Family History  Problem Relation Age of Onset  . Diabetes Father     Past Surgical History:  Procedure Laterality Date  . LUMBAR LAMINECTOMY/DECOMPRESSION MICRODISCECTOMY N/A 08/19/2020   Procedure: LUMBAR LAMINECTOMY/DECOMPRESSION  Lumbar two-three, Lumbar three-four;  Surgeon: Dawley, Theodoro Doing, DO;  Location: [facility];  Service: Neurosurgery;  Laterality: N/A;  . RIGHT/LEFT HEART CATH AND CORONARY ANGIOGRAPHY N/A 08/23/2020   Procedure: RIGHT/LEFT HEART CATH AND CORONARY ANGIOGRAPHY;  Surgeon: [name], MD;  Location: [facility];  Service: Cardiovascular;  Laterality: N/A;   Social History   Occupational History  . Not on file  Tobacco Use  . Smoking status: Never Smoker  . Smokeless tobacco: Never Used  Vaping Use  . Vaping Use: Never used  Substance and Sexual Activity  . Alcohol use: Never  . Drug use: Never  . Sexual activity: Not on file

## 2020-10-04 ENCOUNTER — Telehealth: Payer: Self-pay

## 2020-10-04 ENCOUNTER — Other Ambulatory Visit: Payer: Self-pay

## 2020-10-04 DIAGNOSIS — E119 Type 2 diabetes mellitus without complications: Secondary | ICD-10-CM | POA: Diagnosis not present

## 2020-10-04 DIAGNOSIS — I110 Hypertensive heart disease with heart failure: Secondary | ICD-10-CM | POA: Diagnosis not present

## 2020-10-04 DIAGNOSIS — U071 COVID-19: Secondary | ICD-10-CM | POA: Diagnosis not present

## 2020-10-04 DIAGNOSIS — J811 Chronic pulmonary edema: Secondary | ICD-10-CM | POA: Diagnosis not present

## 2020-10-04 DIAGNOSIS — M1711 Unilateral primary osteoarthritis, right knee: Secondary | ICD-10-CM | POA: Diagnosis not present

## 2020-10-04 DIAGNOSIS — M961 Postlaminectomy syndrome, not elsewhere classified: Secondary | ICD-10-CM | POA: Diagnosis not present

## 2020-10-04 DIAGNOSIS — R32 Unspecified urinary incontinence: Secondary | ICD-10-CM | POA: Diagnosis not present

## 2020-10-04 DIAGNOSIS — I252 Old myocardial infarction: Secondary | ICD-10-CM | POA: Diagnosis not present

## 2020-10-04 DIAGNOSIS — I5020 Unspecified systolic (congestive) heart failure: Secondary | ICD-10-CM | POA: Diagnosis not present

## 2020-10-04 DIAGNOSIS — I480 Paroxysmal atrial fibrillation: Secondary | ICD-10-CM | POA: Diagnosis not present

## 2020-10-04 NOTE — Telephone Encounter (Signed)
Noted  

## 2020-10-04 NOTE — Telephone Encounter (Signed)
Right knee gel injection  

## 2020-10-05 DIAGNOSIS — I480 Paroxysmal atrial fibrillation: Secondary | ICD-10-CM | POA: Diagnosis not present

## 2020-10-05 DIAGNOSIS — J811 Chronic pulmonary edema: Secondary | ICD-10-CM | POA: Diagnosis not present

## 2020-10-05 DIAGNOSIS — M1711 Unilateral primary osteoarthritis, right knee: Secondary | ICD-10-CM | POA: Diagnosis not present

## 2020-10-05 DIAGNOSIS — I110 Hypertensive heart disease with heart failure: Secondary | ICD-10-CM | POA: Diagnosis not present

## 2020-10-05 DIAGNOSIS — I252 Old myocardial infarction: Secondary | ICD-10-CM | POA: Diagnosis not present

## 2020-10-05 DIAGNOSIS — I5020 Unspecified systolic (congestive) heart failure: Secondary | ICD-10-CM | POA: Diagnosis not present

## 2020-10-05 DIAGNOSIS — M961 Postlaminectomy syndrome, not elsewhere classified: Secondary | ICD-10-CM | POA: Diagnosis not present

## 2020-10-05 DIAGNOSIS — R32 Unspecified urinary incontinence: Secondary | ICD-10-CM | POA: Diagnosis not present

## 2020-10-05 DIAGNOSIS — U071 COVID-19: Secondary | ICD-10-CM | POA: Diagnosis not present

## 2020-10-05 DIAGNOSIS — E119 Type 2 diabetes mellitus without complications: Secondary | ICD-10-CM | POA: Diagnosis not present

## 2020-10-09 DIAGNOSIS — I480 Paroxysmal atrial fibrillation: Secondary | ICD-10-CM | POA: Diagnosis not present

## 2020-10-09 DIAGNOSIS — U071 COVID-19: Secondary | ICD-10-CM | POA: Diagnosis not present

## 2020-10-09 DIAGNOSIS — I252 Old myocardial infarction: Secondary | ICD-10-CM | POA: Diagnosis not present

## 2020-10-09 DIAGNOSIS — I5020 Unspecified systolic (congestive) heart failure: Secondary | ICD-10-CM | POA: Diagnosis not present

## 2020-10-09 DIAGNOSIS — I110 Hypertensive heart disease with heart failure: Secondary | ICD-10-CM | POA: Diagnosis not present

## 2020-10-09 DIAGNOSIS — J811 Chronic pulmonary edema: Secondary | ICD-10-CM | POA: Diagnosis not present

## 2020-10-09 DIAGNOSIS — R32 Unspecified urinary incontinence: Secondary | ICD-10-CM | POA: Diagnosis not present

## 2020-10-09 DIAGNOSIS — M961 Postlaminectomy syndrome, not elsewhere classified: Secondary | ICD-10-CM | POA: Diagnosis not present

## 2020-10-09 DIAGNOSIS — M1711 Unilateral primary osteoarthritis, right knee: Secondary | ICD-10-CM | POA: Diagnosis not present

## 2020-10-09 DIAGNOSIS — E119 Type 2 diabetes mellitus without complications: Secondary | ICD-10-CM | POA: Diagnosis not present

## 2020-10-11 ENCOUNTER — Telehealth: Payer: Self-pay

## 2020-10-11 NOTE — Telephone Encounter (Signed)
Submitted VOB, Gel-One, right knee.

## 2020-10-12 ENCOUNTER — Telehealth: Payer: Self-pay

## 2020-10-12 DIAGNOSIS — I252 Old myocardial infarction: Secondary | ICD-10-CM | POA: Diagnosis not present

## 2020-10-12 DIAGNOSIS — I5020 Unspecified systolic (congestive) heart failure: Secondary | ICD-10-CM | POA: Diagnosis not present

## 2020-10-12 DIAGNOSIS — I110 Hypertensive heart disease with heart failure: Secondary | ICD-10-CM | POA: Diagnosis not present

## 2020-10-12 DIAGNOSIS — M961 Postlaminectomy syndrome, not elsewhere classified: Secondary | ICD-10-CM | POA: Diagnosis not present

## 2020-10-12 DIAGNOSIS — R32 Unspecified urinary incontinence: Secondary | ICD-10-CM | POA: Diagnosis not present

## 2020-10-12 DIAGNOSIS — M1711 Unilateral primary osteoarthritis, right knee: Secondary | ICD-10-CM | POA: Diagnosis not present

## 2020-10-12 DIAGNOSIS — E119 Type 2 diabetes mellitus without complications: Secondary | ICD-10-CM | POA: Diagnosis not present

## 2020-10-12 DIAGNOSIS — I480 Paroxysmal atrial fibrillation: Secondary | ICD-10-CM | POA: Diagnosis not present

## 2020-10-12 DIAGNOSIS — U071 COVID-19: Secondary | ICD-10-CM | POA: Diagnosis not present

## 2020-10-12 DIAGNOSIS — J811 Chronic pulmonary edema: Secondary | ICD-10-CM | POA: Diagnosis not present

## 2020-10-12 NOTE — Telephone Encounter (Signed)
Approved, Gel-One, right knee. [place] Patient will be responsible for 20% OOP. Co-pay of $25.00 PA required PA Approval# [id number] Valid 10/12/2020- 01/12/2021  Appt. 10/31/2020 with Dr. Ninfa Linden

## 2020-10-13 ENCOUNTER — Telehealth: Payer: Self-pay

## 2020-10-13 DIAGNOSIS — I25118 Atherosclerotic heart disease of native coronary artery with other forms of angina pectoris: Secondary | ICD-10-CM | POA: Diagnosis not present

## 2020-10-13 DIAGNOSIS — R2681 Unsteadiness on feet: Secondary | ICD-10-CM | POA: Diagnosis not present

## 2020-10-13 DIAGNOSIS — E559 Vitamin D deficiency, unspecified: Secondary | ICD-10-CM | POA: Diagnosis not present

## 2020-10-13 DIAGNOSIS — I6930 Unspecified sequelae of cerebral infarction: Secondary | ICD-10-CM | POA: Diagnosis not present

## 2020-10-13 DIAGNOSIS — I480 Paroxysmal atrial fibrillation: Secondary | ICD-10-CM | POA: Diagnosis not present

## 2020-10-13 DIAGNOSIS — N184 Chronic kidney disease, stage 4 (severe): Secondary | ICD-10-CM | POA: Diagnosis not present

## 2020-10-13 DIAGNOSIS — E782 Mixed hyperlipidemia: Secondary | ICD-10-CM | POA: Diagnosis not present

## 2020-10-13 DIAGNOSIS — E118 Type 2 diabetes mellitus with unspecified complications: Secondary | ICD-10-CM | POA: Diagnosis not present

## 2020-10-13 DIAGNOSIS — D649 Anemia, unspecified: Secondary | ICD-10-CM | POA: Diagnosis not present

## 2020-10-13 DIAGNOSIS — M25561 Pain in right knee: Secondary | ICD-10-CM | POA: Diagnosis not present

## 2020-10-13 NOTE — Telephone Encounter (Signed)
Patient daughter called in returning missed call , says patient is at primary care and told her to see blackman today to drain her knee.

## 2020-10-13 NOTE — Telephone Encounter (Signed)
Talked with patient's daughter concerning patient.

## 2020-10-13 NOTE — Telephone Encounter (Signed)
Kristin [name] believed you tried to call her about the gel injections

## 2020-10-18 ENCOUNTER — Ambulatory Visit: Payer: Medicare HMO | Admitting: Orthopaedic Surgery

## 2020-10-19 ENCOUNTER — Ambulatory Visit (INDEPENDENT_AMBULATORY_CARE_PROVIDER_SITE_OTHER): Payer: Medicare HMO | Admitting: Orthopaedic Surgery

## 2020-10-19 ENCOUNTER — Other Ambulatory Visit: Payer: Self-pay

## 2020-10-19 ENCOUNTER — Encounter: Payer: Self-pay | Admitting: Physician Assistant

## 2020-10-19 DIAGNOSIS — M25561 Pain in right knee: Secondary | ICD-10-CM | POA: Diagnosis not present

## 2020-10-19 DIAGNOSIS — M1711 Unilateral primary osteoarthritis, right knee: Secondary | ICD-10-CM | POA: Diagnosis not present

## 2020-10-19 DIAGNOSIS — G8929 Other chronic pain: Secondary | ICD-10-CM | POA: Diagnosis not present

## 2020-10-19 MED ORDER — CROSS-LINK HYAL ACID (VISC) 30 MG/3ML IX PRSY
30.0000 mg | PREFILLED_SYRINGE | INTRA_ARTICULAR | Status: AC | PRN
  Administered 2020-10-19: 30 mg via INTRA_ARTICULAR

## 2020-10-19 NOTE — Progress Notes (Signed)
Office Visit Note   Patient: Kristin [name]           Date of Birth: November 13, 1941           MRN: [id number] Visit Date: 10/19/2020              Requested by: Caren Macadam, MD [address] [place],  [place] [address] PCP: Caren Macadam, MD   Assessment & Plan: Visit Diagnoses:  1. Chronic pain of right knee   2. Unilateral primary osteoarthritis, right knee     Plan: After aspirating fluid from her right knee I did place a gel 1 injection into that knee with hyaluronic acid which she tolerated well.  Follow-up can be as needed.  We would not ever put a steroid injection in her knee again.  She needs to wait least 6 months between hyaluronic acid injections.  We can always see her back in 2 months to aspirate her knee if needed.  All question concerns were answered and addressed.  Follow-Up Instructions: Return if symptoms worsen or fail to improve.   Orders:  Orders Placed This Encounter  Procedures  . Large Joint Inj   No orders of the defined types were placed in this encounter.     Procedures: Large Joint Inj: R knee on 10/19/2020 4:50 PM Indications: diagnostic evaluation and pain Details: 22 G 1.5 in needle, superolateral approach  Arthrogram: No  Medications: 30 mg Cross-Linked Hyaluronate 30 MG/3ML Outcome: tolerated well, no immediate complications Procedure, treatment alternatives, risks and benefits explained, specific risks discussed. Consent was given by the patient. Immediately prior to procedure a time out was called to verify the correct patient, procedure, equipment, support staff and site/side marked as required. Patient was prepped and draped in the usual sterile fashion.       Clinical Data: No additional findings.   Subjective: Chief Complaint  Patient presents with  . Right Knee - Pain  The patient comes in today hopefully to get a hyaluronic acid injection in her right knee.  We do see that it has been approved.  Her daughter is with  her.  She would like her knee aspirated today.  When I first saw her as a consultation in the hospital I drained a large amount of fluid off of her right knee.  This did turn out to be gout as well as severe osteoarthritis.  She has heart issues so she cannot have a knee replacement.  Her daughter also said that her cardiologist is now recommended against any steroid injections in her knee at this point.  She does ambulate with a walker.  Her pain is daily and is severe with the right knee especially with weightbearing.  HPI  Review of Systems She currently is denying any headache or chest pain.  She denies any fever, chills, nausea, vomiting or shortness of breath  Objective: Vital Signs: There were no vitals taken for this visit.  Physical Exam She is alert and oriented and very slow to mobilize. Ortho Exam Her right knee does show varus malalignment with medial joint line tenderness.  There is a mild effusion and I aspirated about 20 cc of clear yellow fluid from her knee. Specialty Comments:  No specialty comments available.  Imaging: No results found.   PMFS History: Patient Active Problem List   Diagnosis Date Noted  . Unilateral primary osteoarthritis, right knee 10/03/2020  . Cerebral embolism with cerebral infarction 08/28/2020  . Hypertensive heart and kidney disease with HF  and with CKD stage I-IV ([place])   . AKI (acute kidney injury) ([place])   . Atherosclerosis of native coronary artery of native heart without angina pectoris   . NSTEMI (non-ST elevated myocardial infarction) ([place])   . Atrial fibrillation ([place])   . Mixed hyperlipidemia   . Elevated troponin   . Pulmonary edema   . Acute HFrEF (heart failure with reduced ejection fraction) ([place])   . Cardiomyopathy ([place])   . Effusion of right knee joint   . SIRS (systemic inflammatory response syndrome) ([place]) 08/18/2020  . Lower extremity weakness 08/18/2020  . Hypertension   . Chronic low back pain with bilateral  sciatica    Past Medical History:  Diagnosis Date  . Atrial fibrillation ([place])   . CHF (congestive heart failure) ([place])   . Coronary artery disease   . Diabetes mellitus without complication ([place])   . Hypercholesteremia   . Hypertension   . Renal disorder    CKD Stage IV  . Stroke [facility])     Family History  Problem Relation Age of Onset  . Diabetes Father     Past Surgical History:  Procedure Laterality Date  . LUMBAR LAMINECTOMY/DECOMPRESSION MICRODISCECTOMY N/A 08/19/2020   Procedure: LUMBAR LAMINECTOMY/DECOMPRESSION  Lumbar two-three, Lumbar three-four;  Surgeon: Dawley, Theodoro Doing, DO;  Location: [facility];  Service: Neurosurgery;  Laterality: N/A;  . RIGHT/LEFT HEART CATH AND CORONARY ANGIOGRAPHY N/A 08/23/2020   Procedure: RIGHT/LEFT HEART CATH AND CORONARY ANGIOGRAPHY;  Surgeon: [name], MD;  Location: [facility];  Service: Cardiovascular;  Laterality: N/A;   Social History   Occupational History  . Not on file  Tobacco Use  . Smoking status: Never Smoker  . Smokeless tobacco: Never Used  Vaping Use  . Vaping Use: Never used  Substance and Sexual Activity  . Alcohol use: Never  . Drug use: Never  . Sexual activity: Not on file

## 2020-10-25 DIAGNOSIS — M961 Postlaminectomy syndrome, not elsewhere classified: Secondary | ICD-10-CM | POA: Diagnosis not present

## 2020-10-25 DIAGNOSIS — R32 Unspecified urinary incontinence: Secondary | ICD-10-CM | POA: Diagnosis not present

## 2020-10-25 DIAGNOSIS — I110 Hypertensive heart disease with heart failure: Secondary | ICD-10-CM | POA: Diagnosis not present

## 2020-10-25 DIAGNOSIS — I252 Old myocardial infarction: Secondary | ICD-10-CM | POA: Diagnosis not present

## 2020-10-25 DIAGNOSIS — U071 COVID-19: Secondary | ICD-10-CM | POA: Diagnosis not present

## 2020-10-25 DIAGNOSIS — M1711 Unilateral primary osteoarthritis, right knee: Secondary | ICD-10-CM | POA: Diagnosis not present

## 2020-10-25 DIAGNOSIS — I480 Paroxysmal atrial fibrillation: Secondary | ICD-10-CM | POA: Diagnosis not present

## 2020-10-25 DIAGNOSIS — I5020 Unspecified systolic (congestive) heart failure: Secondary | ICD-10-CM | POA: Diagnosis not present

## 2020-10-25 DIAGNOSIS — J811 Chronic pulmonary edema: Secondary | ICD-10-CM | POA: Diagnosis not present

## 2020-10-25 DIAGNOSIS — E119 Type 2 diabetes mellitus without complications: Secondary | ICD-10-CM | POA: Diagnosis not present

## 2020-10-26 DIAGNOSIS — I252 Old myocardial infarction: Secondary | ICD-10-CM | POA: Diagnosis not present

## 2020-10-26 DIAGNOSIS — M1711 Unilateral primary osteoarthritis, right knee: Secondary | ICD-10-CM | POA: Diagnosis not present

## 2020-10-26 DIAGNOSIS — U071 COVID-19: Secondary | ICD-10-CM | POA: Diagnosis not present

## 2020-10-26 DIAGNOSIS — R32 Unspecified urinary incontinence: Secondary | ICD-10-CM | POA: Diagnosis not present

## 2020-10-26 DIAGNOSIS — M961 Postlaminectomy syndrome, not elsewhere classified: Secondary | ICD-10-CM | POA: Diagnosis not present

## 2020-10-26 DIAGNOSIS — I480 Paroxysmal atrial fibrillation: Secondary | ICD-10-CM | POA: Diagnosis not present

## 2020-10-26 DIAGNOSIS — E119 Type 2 diabetes mellitus without complications: Secondary | ICD-10-CM | POA: Diagnosis not present

## 2020-10-26 DIAGNOSIS — I5020 Unspecified systolic (congestive) heart failure: Secondary | ICD-10-CM | POA: Diagnosis not present

## 2020-10-26 DIAGNOSIS — J811 Chronic pulmonary edema: Secondary | ICD-10-CM | POA: Diagnosis not present

## 2020-10-26 DIAGNOSIS — I110 Hypertensive heart disease with heart failure: Secondary | ICD-10-CM | POA: Diagnosis not present

## 2020-10-27 ENCOUNTER — Ambulatory Visit: Payer: Medicare HMO | Admitting: Cardiology

## 2020-10-27 ENCOUNTER — Other Ambulatory Visit: Payer: Self-pay

## 2020-10-27 ENCOUNTER — Encounter: Payer: Self-pay | Admitting: Cardiology

## 2020-10-27 VITALS — BP 117/55 | HR 65 | Ht 64.0 in | Wt 191.0 lb

## 2020-10-27 DIAGNOSIS — Z79899 Other long term (current) drug therapy: Secondary | ICD-10-CM | POA: Diagnosis not present

## 2020-10-27 DIAGNOSIS — E1122 Type 2 diabetes mellitus with diabetic chronic kidney disease: Secondary | ICD-10-CM

## 2020-10-27 DIAGNOSIS — I5022 Chronic systolic (congestive) heart failure: Secondary | ICD-10-CM | POA: Diagnosis not present

## 2020-10-27 DIAGNOSIS — Z8673 Personal history of transient ischemic attack (TIA), and cerebral infarction without residual deficits: Secondary | ICD-10-CM | POA: Diagnosis not present

## 2020-10-27 DIAGNOSIS — I2510 Atherosclerotic heart disease of native coronary artery without angina pectoris: Secondary | ICD-10-CM | POA: Diagnosis not present

## 2020-10-27 DIAGNOSIS — I255 Ischemic cardiomyopathy: Secondary | ICD-10-CM

## 2020-10-27 DIAGNOSIS — Z7901 Long term (current) use of anticoagulants: Secondary | ICD-10-CM | POA: Diagnosis not present

## 2020-10-27 DIAGNOSIS — E782 Mixed hyperlipidemia: Secondary | ICD-10-CM | POA: Diagnosis not present

## 2020-10-27 DIAGNOSIS — I480 Paroxysmal atrial fibrillation: Secondary | ICD-10-CM

## 2020-10-27 DIAGNOSIS — I252 Old myocardial infarction: Secondary | ICD-10-CM

## 2020-10-27 MED ORDER — AMIODARONE HCL 100 MG PO TABS: 100.0000 mg | ORAL_TABLET | Freq: Every day | ORAL | 0 refills | Status: DC

## 2020-10-27 MED ORDER — BUMETANIDE 1 MG PO TABS: 1.0000 mg | ORAL_TABLET | Freq: Two times a day (BID) | ORAL | 0 refills | Status: DC

## 2020-10-27 MED ORDER — APIXABAN 2.5 MG PO TABS: 2.5000 mg | ORAL_TABLET | Freq: Two times a day (BID) | ORAL | 0 refills | Status: DC

## 2020-10-27 MED ORDER — ISOSORB DINITRATE-HYDRALAZINE 20-37.5 MG PO TABS: 2.0000 | ORAL_TABLET | Freq: Three times a day (TID) | ORAL | 0 refills | Status: DC

## 2020-10-27 MED ORDER — ASPIRIN EC 81 MG PO TBEC: 81.0000 mg | DELAYED_RELEASE_TABLET | Freq: Every day | ORAL | 3 refills | Status: DC

## 2020-10-27 NOTE — Progress Notes (Signed)
Shaughnessy Gethers Date of Birth: December 09, 1941 MRN: [id number] Primary Care Provider:Hagler, Apolonio Schneiders, MD Primary Cardiologist: Rex Kras, DO, [facility] (established care 08/22/2020)  Date: 10/27/20 Last Office Visit: 09/29/2020  Chief Complaint  Patient presents with  . heart failure    pt needs refills   . Chest Pain  . Shortness of Breath    HPI  Kristin [name] is a 79 y.o.  female who presents to the office with a chief complaint of "management of congestive heart failure, chest pain, shortness of breath." Patient's past medical history and cardiovascular risk factors include: Paroxysmal atrial fibrillation, NSTEMI (08/2020), multivessel CAD, ischemic cardiomyopathy, chronic heart failure with reduced EF/stage C/NYHA class II, acute cerebellar stroke, lumbar stenosis status post laminectomy, hypertension, CKD stage IV, diabetes miellitus type 2, postmenopausal female, advanced age.   Patient presents to the office accompanied by her daughter Kristin [name].   Patient recently presented to [facility] in September with a chief complaint of bilateral lower extremity weakness and back pain.  She underwent imaging and was noted to have lumbar stenosis and underwent L2-L4 laminectomy on 08/19/2020.  She was doing well postoperatively until August 22, 2020 she started having symptoms of shortness of breath, chest x-ray noted pulmonary edema, EKG notable for new onset of atrial fibrillation, and troponins greater than 27,000 ruled in for non-STEMI.  Given the recent neurosurgery the surgical team recommended holding off full IV anticoagulation or DOAC's given the risk of bleeding.  Focused on rate control and rhythm control strategy and patient converted to normal sinus rhythm.  Prior to discharge she underwent left heart catheterization and was found to have multivessel CAD no interventions were performed as that she would not be a candidate for dual antiplatelet therapy for the same reasons noted  above.  The plan was to treat her medically.  During the hospitalization patient was doing well until she started experiencing dizziness underwent additional imaging and was found to have acute cerebellar stroke.  Patient was discharged on 08/30/2020 and has been followed up in the office for close observation to prevent rehospitalization for CAD or congestive heart failure.  Since last office visit patient states that she is doing well from a cardiovascular standpoint.  She has had 1 episode of acute chest discomfort, located over the right anterior chest wall, about 1.5 weeks ago, discomfort occurred at rest, lasted for about 20 minutes, nonradiating, not effort related.  The symptoms lasted for 20 minutes she took 1 sublingual nitroglycerin tablet and her pain resolved and has not resurfaced.   From atrial fibrillation standpoint patient is tolerating amiodarone, beta-blockers, and oral anticoagulation.  She does not endorse any evidence of bleeding.   Given her underlying chronic kidney disease given appointment with nephrology on November 10, 2020.  FUNCTIONAL STATUS: No structured exercise program or daily routine.   ALLERGIES: No Known Allergies  MEDICATION LIST PRIOR TO VISIT: Current Outpatient Medications on File Prior to Visit  Medication Sig Dispense Refill  . amLODipine (NORVASC) 10 MG tablet Take 10 mg by mouth daily.    . cholecalciferol (VITAMIN D) 25 MCG (1000 UNIT) tablet Take 3,000 Units by mouth daily.    . colchicine 0.6 MG tablet Take 0.5 tablets (0.3 mg total) by mouth daily. 15 tablet 0  . diclofenac Sodium (VOLTAREN) 1 % GEL Apply 2 g topically 4 (four) times daily.    Marland Kitchen gabapentin (NEURONTIN) 100 MG capsule Take 200 mg by mouth at bedtime.    Marland Kitchen HYDROcodone-acetaminophen (NORCO/VICODIN) 5-325 MG tablet  Take 1 tablet by mouth every 6 (six) hours as needed. 10 tablet 0  . methocarbamol (ROBAXIN) 500 MG tablet Take 1 tablet (500 mg total) by mouth every 6 (six) hours as  needed for muscle spasms. 30 tablet 0  . metoprolol succinate (TOPROL-XL) 50 MG 24 hr tablet Take 50 mg by mouth daily.    . Misc. Devices MISC Please dispense one power stair lift for at home daily use prn debility. 1 Device 0  . nitroGLYCERIN (NITROSTAT) 0.4 MG SL tablet Place 1 tablet (0.4 mg total) under the tongue every 5 (five) minutes as needed for chest pain. 30 tablet 0  . pantoprazole (PROTONIX) 40 MG tablet Take 1 tablet (40 mg total) by mouth daily. 30 tablet 0  . polyethylene glycol (MIRALAX / GLYCOLAX) 17 g packet Take 17 g by mouth daily as needed for moderate constipation. 14 each 0  . rosuvastatin (CRESTOR) 20 MG tablet Take 1 tablet (20 mg total) by mouth at bedtime. 90 tablet 1   No current facility-administered medications on file prior to visit.    PAST MEDICAL HISTORY: Past Medical History:  Diagnosis Date  . Atrial fibrillation ([place])   . CHF (congestive heart failure) ([place])   . Coronary artery disease   . Diabetes mellitus without complication ([place])   . Hypercholesteremia   . Hypertension   . Renal disorder    CKD Stage IV  . Stroke [facility])     PAST SURGICAL HISTORY: Past Surgical History:  Procedure Laterality Date  . LUMBAR LAMINECTOMY/DECOMPRESSION MICRODISCECTOMY N/A 08/19/2020   Procedure: LUMBAR LAMINECTOMY/DECOMPRESSION  Lumbar two-three, Lumbar three-four;  Surgeon: Dawley, Theodoro Doing, DO;  Location: [facility];  Service: Neurosurgery;  Laterality: N/A;  . RIGHT/LEFT HEART CATH AND CORONARY ANGIOGRAPHY N/A 08/23/2020   Procedure: RIGHT/LEFT HEART CATH AND CORONARY ANGIOGRAPHY;  Surgeon: [name], MD;  Location: [place] [facility];  Service: Cardiovascular;  Laterality: N/A;    FAMILY HISTORY: The patient's family history includes Diabetes in her father.   SOCIAL HISTORY:  The patient  reports that she has never smoked. She has never used smokeless tobacco. She reports that she does not drink alcohol and does not use drugs.  Review of Systems   Constitutional: Negative for chills and fever.  HENT: Negative for hoarse voice and nosebleeds.   Eyes: Negative for discharge, double vision and pain.  Cardiovascular: Positive for chest pain (intermitten). Negative for claudication, dyspnea on exertion, leg swelling, near-syncope, orthopnea, palpitations, paroxysmal nocturnal dyspnea and syncope.  Respiratory: Positive for shortness of breath (sporadic ). Negative for hemoptysis.   Musculoskeletal: Negative for muscle cramps and myalgias.       Unsteady with gait, but no falls.   Gastrointestinal: Negative for abdominal pain, constipation, diarrhea, hematemesis, hematochezia, melena, nausea and vomiting.  Neurological: Positive for dizziness (improving) and light-headedness (improving).    PHYSICAL EXAM: Vitals with BMI 10/27/2020 09/29/2020 09/26/2020  Height 5\' 4"  5\' 4"  -  Weight 191 lbs 194 lbs -  BMI 17.79 39.03 -  Systolic 009 233 007  Diastolic 55 69 75  Pulse 65 70 70   CONSTITUTIONAL: Age-appropriate female, hemodynamically stable, no acute distress.  SKIN: Skin is warm and dry. No rash noted. No cyanosis. No pallor. No jaundice HEAD: Normocephalic and atraumatic.  EYES: No scleral icterus MOUTH/THROAT: Moist oral membranes.  NECK: No JVD present. No thyromegaly noted. No carotid bruits  LYMPHATIC: No visible cervical adenopathy.  CHEST Normal respiratory effort. No intercostal retractions  LUNGS: Clear to auscultation  bilaterally.  No stridor. No wheezes. No rales.  CARDIOVASCULAR: Regular rate and rhythm, positive B3-Z3, soft holosystolic murmur heard at the apex, no rubs or gallops appreciated ABDOMINAL: Obese, soft, nontender, nondistended, positive bowel sounds in all 4 quadrants, no apparent ascites.  EXTREMITIES: No peripheral edema  HEMATOLOGIC: No significant bruising NEUROLOGIC: Oriented to person, place, and time. Nonfocal. Normal muscle tone.  PSYCHIATRIC: Normal mood and affect. Normal behavior.  Cooperative  CARDIAC DATABASE: EKG: 09/29/2020: Normal sinus rhythm, 71 bpm, old anteroseptal infarct, nonspecific T wave abnormality, without underlying injury pattern.  Echocardiogram: 08/22/2020: 1. Left ventricular ejection fraction, by estimation, is 30 to 35%. The left ventricle has moderately decreased function. The left ventricle demonstrates regional wall motion abnormalities. Hyokinesis of base to mid anterior wall, base to mid anterospetal wall, apical septal, and apex. There is moderate left ventricular hypertrophy. Diastolic function is not assessed due to atrial fibrillation. No apparent left ventricular thrombus. 2. Right ventricular systolic function is normal. The right ventricular size is normal. There is moderately elevated pulmonary artery systolic pressure. The estimated right ventricular systolic pressure is 29.9 mmHg. 3. The mitral valve is normal in structure. Mild mitral valve regurgitation. No evidence of mitral stenosis. 4. The aortic valve is normal in structure. Aortic valve regurgitation is not visualized. Mild aortic valve sclerosis is present, with no evidence of aortic valve stenosis. 5. The inferior vena cava is dilated in size with >50% respiratory variability, suggesting right atrial pressure of 8 mmHg.  Stress Testing:  No results found for this or any previous visit from the past 1095 days.  Heart Catheterization: Left and right heart catheterization 08/23/2020: RA 13/15, mean 12, RA saturation 56%; RV 51/8, EDP 12 mmHg; PA 54/26, mean 38 mmHg, PA saturation 45%; PW 21/21, mean 20 mmHg, aortic saturation 97%. Cardiac output 4.96, cardiac index 2.51.  SVR elevated at 1258.  PVR mildly elevated at 290.  QP/QS 0.79. Left main: Mild disease. LAD: Proximal 80% stenosis followed by 100% occlusion.  Before the occlusion, at the subtotal site, there is a large diagonal that comes off and has secondary branches.  Apical LAD is barely visualized with minimal amount  of collaterals.  Thrombus is evident at the site of occlusion. Circumflex: Large vessel, moderate-sized OM1 and moderate-sized OM 2.  Minimal disease. RCA: Ostial 80 to 90% stenosis.  Mild disease in the remaining of the vessel, PL branch has a 50% stenosis of the proximal and ostial segment.  Severe dampening was evident with engagement of 5 French diagnostic catheter. LV: EDP mildly elevated at 15- 18 mmHg.  No pressure gradient across the aortic valve.  LVEF 30 to 35% with entire anterior, anterolateral, apical and apical inferior akinesis.  No significant MR.  LABORATORY DATA: CBC Latest Ref Rng & Units 09/25/2020 08/28/2020 08/27/2020  WBC 4.0 - 10.5 K/uL 7.2 10.7(H) 12.3(H)  Hemoglobin 12.0 - 15.0 g/dL 9.3(L) 8.7(L) 8.9(L)  Hematocrit 36 - 46 % 31.0(L) 27.9(L) 27.8(L)  Platelets 150 - 400 K/uL 266 387 328    CMP Latest Ref Rng & Units 09/25/2020 08/29/2020 08/28/2020  Glucose 70 - 99 mg/dL 207(H) 126(H) 137(H)  BUN 8 - 23 mg/dL 24(H) 33(H) 29(H)  Creatinine 0.44 - 1.00 mg/dL 2.08(H) 1.91(H) 1.79(H)  Sodium 135 - 145 mmol/L 137 136 136  Potassium 3.5 - 5.1 mmol/L 3.9 4.7 4.3  Chloride 98 - 111 mmol/L 95(L) 96(L) 98  CO2 22 - 32 mmol/L 31 23 27   Calcium 8.9 - 10.3 mg/dL 9.2 9.3 9.3  Total Protein 6.5 - 8.1 g/dL 6.6 - -  Total Bilirubin 0.3 - 1.2 mg/dL 0.4 - -  Alkaline Phos 38 - 126 U/L 55 - -  AST 15 - 41 U/L 20 - -  ALT 0 - 44 U/L 12 - -    Lipid Panel     Component Value Date/Time   CHOL 130 08/29/2020 0250   TRIG 89 08/29/2020 0250   HDL 47 08/29/2020 0250   CHOLHDL 2.8 08/29/2020 0250   VLDL 18 08/29/2020 0250   LDLCALC 65 08/29/2020 0250    Lab Results  Component Value Date   HGBA1C 6.7 (H) 08/27/2020   No components found for: NTPROBNP Lab Results  Component Value Date   TSH 1.054 08/22/2020   TSH 1.190 03/13/2020    Cardiac Panel (last 3 results) No results for input(s): CKTOTAL, CKMB, TROPONINIHS, RELINDX in the last 72 hours.  IMPRESSION:    ICD-10-CM    1. Chronic HFrEF (heart failure with reduced ejection fraction) (HCC)  Z30.86 Basic metabolic panel    Magnesium    Pro b natriuretic peptide (BNP)    bumetanide (BUMEX) 1 MG tablet    isosorbide-hydrALAZINE (BIDIL) 20-37.5 MG tablet  2. Paroxysmal atrial fibrillation (HCC)  I48.0 apixaban (ELIQUIS) 2.5 MG TABS tablet    amiodarone (PACERONE) 100 MG tablet  3. Long term (current) use of anticoagulants  Z79.01 apixaban (ELIQUIS) 2.5 MG TABS tablet  4. Long term current use of antiarrhythmic drug  Z79.899   5. Atherosclerosis of native coronary artery of native heart without angina pectoris  I25.10   6. Ischemic cardiomyopathy  I25.5 ECHOCARDIOGRAM COMPLETE  7. Hx of non-ST elevation myocardial infarction (NSTEMI)  I25.2   8. Mixed hyperlipidemia  E78.2   9. Hx of TIA (transient ischemic attack) and stroke  Z86.73   10. Type 2 diabetes mellitus with stage 4 chronic kidney disease, without long-term current use of insulin [facility])  E11.22    N18.4      RECOMMENDATIONS: Kristin [name] is a 79 y.o. female whose past medical history and cardiovascular risk factors include: Paroxysmal atrial fibrillation, NSTEMI (08/2020), multivessel CAD, ischemic cardiomyopathy, chronic heart failure with reduced EF/stage C/NYHA class II, acute cerebellar stroke, lumbar stenosis status post laminectomy, hypertension, CKD stage IV, diabetes miellitus type 2, postmenopausal female, advanced age.   Paroxysmal atrial fibrillation: Currently normal sinus rhythm. Continue Toprol-XL, amiodarone, and Eliquis.  Decrease amiodarone to 100 mg p.o. daily.  We will repeat an EKG at the next office visit if the patient remains in normal sinus rhythm we will discontinue amiodarone at the next visit.  Long-term oral anticoagulation:  CHA2DS2-VASc SCORE is 9 which correlates to 15.2 % risk of stroke per year (CHF, hypertension, age greater than 56, diabetes, stroke, history of non-STEMI, female).  Reemphasized the risks,  benefits, and alternatives to oral anticoagulation.   Chronic heart failure with reduced EF, stage C, NYHA class II:  Continue current medical therapy.  Refilled Isordil, Bumex, amlodipine.  Recommended discontinuation of Bumex and transitioning her to ARB/Arni.  Patient's daughter wants to hold off on initiation of ARNi due to the possibility of worsening kidney function.  Patient has an appointment with nephrology on 11/10/2020 and will discuss the initiation of ARNi given her kidney function  Recommend daily weight check, strict I/O's  Fluid restriction to <2L per day, Na restriction < 2g per day  Repeat follow-up 90 days echocardiogram to reevaluate LVEF from the index event.  Check BMP, BNP, magnesium level.  Established coronary artery disease without angina pectoris:  Patient has had 1 episode of chest pain since last office visit.  The episode was predominantly atypical in nature and resolved after taking 1 sublingual nitroglycerin tablet.  Continue current antianginal therapy.  Patient does have underlying CAD as noted on prior left heart catheterization.  However, the shared decision is to treat medically given her underlying kidney function which predisposes her to acute kidney injury and the possibility of hemodialysis.  Despite her kidney function patient is asked to seek medical attention if she has typical chest pain or her symptoms increase in intensity, frequency, duration by going to the closest ER via EMS for further evaluation and management.  Both the patient and daughter verbalized understanding.    Medications reconciled.  Sublingual nitroglycerin tablets were refilled.  Recommend aspirin 81 mg p.o. daily.  Type 2 diabetes mellitus, non-insulin-dependent: Currently managed by primary care provider.  Mixed Hyperlipidemia:  . Continue statin therapy.   . Follow lipids. . Currently managed by primary care provider. . Patient denies myalgia or other side  effects. . Most recent lipid profile reviewed with the patient. Most recent AST and ALT values reviewed with the patient.  FINAL MEDICATION LIST END OF ENCOUNTER: Meds ordered this encounter  Medications  . bumetanide (BUMEX) 1 MG tablet    Sig: Take 1 tablet (1 mg total) by mouth 2 (two) times daily.    Dispense:  60 tablet    Refill:  0  . apixaban (ELIQUIS) 2.5 MG TABS tablet    Sig: Take 1 tablet (2.5 mg total) by mouth 2 (two) times daily.    Dispense:  60 tablet    Refill:  0  . isosorbide-hydrALAZINE (BIDIL) 20-37.5 MG tablet    Sig: Take 2 tablets by mouth 3 (three) times daily.    Dispense:  90 tablet    Refill:  0  . amiodarone (PACERONE) 100 MG tablet    Sig: Take 1 tablet (100 mg total) by mouth daily.    Dispense:  60 tablet    Refill:  0    Medications Discontinued During This Encounter  Medication Reason  . ALPRAZolam (XANAX) 0.25 MG tablet Patient Preference  . amiodarone (PACERONE) 200 MG tablet Dose change  . apixaban (ELIQUIS) 2.5 MG TABS tablet Reorder  . bumetanide (BUMEX) 1 MG tablet Reorder  . isosorbide-hydrALAZINE (BIDIL) 20-37.5 MG tablet Reorder     Current Outpatient Medications:  .  amLODipine (NORVASC) 10 MG tablet, Take 10 mg by mouth daily., Disp: , Rfl:  .  apixaban (ELIQUIS) 2.5 MG TABS tablet, Take 1 tablet (2.5 mg total) by mouth 2 (two) times daily., Disp: 60 tablet, Rfl: 0 .  bumetanide (BUMEX) 1 MG tablet, Take 1 tablet (1 mg total) by mouth 2 (two) times daily., Disp: 60 tablet, Rfl: 0 .  cholecalciferol (VITAMIN D) 25 MCG (1000 UNIT) tablet, Take 3,000 Units by mouth daily., Disp: , Rfl:  .  colchicine 0.6 MG tablet, Take 0.5 tablets (0.3 mg total) by mouth daily., Disp: 15 tablet, Rfl: 0 .  diclofenac Sodium (VOLTAREN) 1 % GEL, Apply 2 g topically 4 (four) times daily., Disp: , Rfl:  .  gabapentin (NEURONTIN) 100 MG capsule, Take 200 mg by mouth at bedtime., Disp: , Rfl:  .  HYDROcodone-acetaminophen (NORCO/VICODIN) 5-325 MG tablet,  Take 1 tablet by mouth every 6 (six) hours as needed., Disp: 10 tablet, Rfl: 0 .  isosorbide-hydrALAZINE (BIDIL) 20-37.5 MG tablet, Take 2 tablets by mouth 3 (  three) times daily., Disp: 90 tablet, Rfl: 0 .  methocarbamol (ROBAXIN) 500 MG tablet, Take 1 tablet (500 mg total) by mouth every 6 (six) hours as needed for muscle spasms., Disp: 30 tablet, Rfl: 0 .  metoprolol succinate (TOPROL-XL) 50 MG 24 hr tablet, Take 50 mg by mouth daily., Disp: , Rfl:  .  Misc. Devices MISC, Please dispense one power stair lift for at home daily use prn debility., Disp: 1 Device, Rfl: 0 .  nitroGLYCERIN (NITROSTAT) 0.4 MG SL tablet, Place 1 tablet (0.4 mg total) under the tongue every 5 (five) minutes as needed for chest pain., Disp: 30 tablet, Rfl: 0 .  pantoprazole (PROTONIX) 40 MG tablet, Take 1 tablet (40 mg total) by mouth daily., Disp: 30 tablet, Rfl: 0 .  polyethylene glycol (MIRALAX / GLYCOLAX) 17 g packet, Take 17 g by mouth daily as needed for moderate constipation., Disp: 14 each, Rfl: 0 .  rosuvastatin (CRESTOR) 20 MG tablet, Take 1 tablet (20 mg total) by mouth at bedtime., Disp: 90 tablet, Rfl: 1 .  amiodarone (PACERONE) 100 MG tablet, Take 1 tablet (100 mg total) by mouth daily., Disp: 60 tablet, Rfl: 0  Orders Placed This Encounter  Procedures  . Basic metabolic panel  . Magnesium  . Pro b natriuretic peptide (BNP)  . ECHOCARDIOGRAM COMPLETE   --Continue cardiac medications as reconciled in final medication list. --Return in about 2 months (around 12/27/2020) for heart failure management., EKG prior to arrival.. Or sooner if needed. --Continue follow-up with your primary care physician regarding the management of your other chronic comorbid conditions.  Patient's questions and concerns were addressed to her satisfaction. She voices understanding of the instructions provided during this encounter.   This note was created using a voice recognition software as a result there may be grammatical  errors inadvertently enclosed that do not reflect the nature of this encounter. Every attempt is made to correct such errors.  Rex Kras, [place], [facility]  Pager: [phone] Office: [phone]

## 2020-10-28 LAB — BASIC METABOLIC PANEL
BUN/Creatinine Ratio: 14 (ref 12–28)
BUN: 27 mg/dL (ref 8–27)
CO2: 26 mmol/L (ref 20–29)
Calcium: 9.8 mg/dL (ref 8.7–10.3)
Chloride: 101 mmol/L (ref 96–106)
Creatinine, Ser: 1.89 mg/dL — ABNORMAL HIGH (ref 0.57–1.00)
GFR calc Af Amer: 28 mL/min/{1.73_m2} — ABNORMAL LOW (ref >59)
GFR calc non Af Amer: 25 mL/min/{1.73_m2} — ABNORMAL LOW (ref >59)
Glucose: 105 mg/dL — ABNORMAL HIGH (ref 65–99)
Potassium: 4.2 mmol/L (ref 3.5–5.2)
Sodium: 142 mmol/L (ref 134–144)

## 2020-10-28 LAB — MAGNESIUM: Magnesium: 1.9 mg/dL (ref 1.6–2.3)

## 2020-10-28 LAB — PRO B NATRIURETIC PEPTIDE: NT-Pro BNP: 2019 pg/mL — ABNORMAL HIGH (ref 0–738)

## 2020-10-31 ENCOUNTER — Ambulatory Visit: Payer: Medicare HMO | Admitting: Orthopaedic Surgery

## 2020-10-31 DIAGNOSIS — U071 COVID-19: Secondary | ICD-10-CM | POA: Diagnosis not present

## 2020-10-31 DIAGNOSIS — I110 Hypertensive heart disease with heart failure: Secondary | ICD-10-CM | POA: Diagnosis not present

## 2020-10-31 DIAGNOSIS — I480 Paroxysmal atrial fibrillation: Secondary | ICD-10-CM | POA: Diagnosis not present

## 2020-10-31 DIAGNOSIS — E119 Type 2 diabetes mellitus without complications: Secondary | ICD-10-CM | POA: Diagnosis not present

## 2020-10-31 DIAGNOSIS — I252 Old myocardial infarction: Secondary | ICD-10-CM | POA: Diagnosis not present

## 2020-10-31 DIAGNOSIS — M961 Postlaminectomy syndrome, not elsewhere classified: Secondary | ICD-10-CM | POA: Diagnosis not present

## 2020-10-31 DIAGNOSIS — I5020 Unspecified systolic (congestive) heart failure: Secondary | ICD-10-CM | POA: Diagnosis not present

## 2020-10-31 DIAGNOSIS — M1711 Unilateral primary osteoarthritis, right knee: Secondary | ICD-10-CM | POA: Diagnosis not present

## 2020-10-31 DIAGNOSIS — J811 Chronic pulmonary edema: Secondary | ICD-10-CM | POA: Diagnosis not present

## 2020-10-31 DIAGNOSIS — R32 Unspecified urinary incontinence: Secondary | ICD-10-CM | POA: Diagnosis not present

## 2020-11-02 DIAGNOSIS — I480 Paroxysmal atrial fibrillation: Secondary | ICD-10-CM | POA: Diagnosis not present

## 2020-11-02 DIAGNOSIS — E119 Type 2 diabetes mellitus without complications: Secondary | ICD-10-CM | POA: Diagnosis not present

## 2020-11-02 DIAGNOSIS — J811 Chronic pulmonary edema: Secondary | ICD-10-CM | POA: Diagnosis not present

## 2020-11-02 DIAGNOSIS — I110 Hypertensive heart disease with heart failure: Secondary | ICD-10-CM | POA: Diagnosis not present

## 2020-11-02 DIAGNOSIS — R32 Unspecified urinary incontinence: Secondary | ICD-10-CM | POA: Diagnosis not present

## 2020-11-02 DIAGNOSIS — M1711 Unilateral primary osteoarthritis, right knee: Secondary | ICD-10-CM | POA: Diagnosis not present

## 2020-11-02 DIAGNOSIS — I5020 Unspecified systolic (congestive) heart failure: Secondary | ICD-10-CM | POA: Diagnosis not present

## 2020-11-02 DIAGNOSIS — M961 Postlaminectomy syndrome, not elsewhere classified: Secondary | ICD-10-CM | POA: Diagnosis not present

## 2020-11-02 DIAGNOSIS — I252 Old myocardial infarction: Secondary | ICD-10-CM | POA: Diagnosis not present

## 2020-11-02 DIAGNOSIS — U071 COVID-19: Secondary | ICD-10-CM | POA: Diagnosis not present

## 2020-11-03 DIAGNOSIS — I5022 Chronic systolic (congestive) heart failure: Secondary | ICD-10-CM | POA: Diagnosis not present

## 2020-11-03 DIAGNOSIS — R5381 Other malaise: Secondary | ICD-10-CM | POA: Diagnosis not present

## 2020-11-03 DIAGNOSIS — M48061 Spinal stenosis, lumbar region without neurogenic claudication: Secondary | ICD-10-CM | POA: Diagnosis not present

## 2020-11-10 DIAGNOSIS — I2510 Atherosclerotic heart disease of native coronary artery without angina pectoris: Secondary | ICD-10-CM | POA: Diagnosis not present

## 2020-11-10 DIAGNOSIS — I639 Cerebral infarction, unspecified: Secondary | ICD-10-CM | POA: Diagnosis not present

## 2020-11-10 DIAGNOSIS — M25561 Pain in right knee: Secondary | ICD-10-CM | POA: Diagnosis not present

## 2020-11-10 DIAGNOSIS — R2681 Unsteadiness on feet: Secondary | ICD-10-CM | POA: Diagnosis not present

## 2020-11-10 DIAGNOSIS — R7309 Other abnormal glucose: Secondary | ICD-10-CM | POA: Diagnosis not present

## 2020-11-10 DIAGNOSIS — Z7409 Other reduced mobility: Secondary | ICD-10-CM | POA: Diagnosis not present

## 2020-11-10 DIAGNOSIS — G894 Chronic pain syndrome: Secondary | ICD-10-CM | POA: Diagnosis not present

## 2020-11-10 DIAGNOSIS — E1122 Type 2 diabetes mellitus with diabetic chronic kidney disease: Secondary | ICD-10-CM | POA: Diagnosis not present

## 2020-11-10 DIAGNOSIS — I5020 Unspecified systolic (congestive) heart failure: Secondary | ICD-10-CM | POA: Diagnosis not present

## 2020-11-10 DIAGNOSIS — I480 Paroxysmal atrial fibrillation: Secondary | ICD-10-CM | POA: Diagnosis not present

## 2020-11-10 DIAGNOSIS — N184 Chronic kidney disease, stage 4 (severe): Secondary | ICD-10-CM | POA: Diagnosis not present

## 2020-11-10 DIAGNOSIS — I129 Hypertensive chronic kidney disease with stage 1 through stage 4 chronic kidney disease, or unspecified chronic kidney disease: Secondary | ICD-10-CM | POA: Diagnosis not present

## 2020-11-23 ENCOUNTER — Other Ambulatory Visit: Payer: Self-pay | Admitting: Cardiology

## 2020-11-23 DIAGNOSIS — I480 Paroxysmal atrial fibrillation: Secondary | ICD-10-CM

## 2020-11-23 DIAGNOSIS — I5022 Chronic systolic (congestive) heart failure: Secondary | ICD-10-CM

## 2020-11-23 DIAGNOSIS — Z7901 Long term (current) use of anticoagulants: Secondary | ICD-10-CM

## 2020-12-03 DIAGNOSIS — R5381 Other malaise: Secondary | ICD-10-CM | POA: Diagnosis not present

## 2020-12-03 DIAGNOSIS — I5022 Chronic systolic (congestive) heart failure: Secondary | ICD-10-CM | POA: Diagnosis not present

## 2020-12-03 DIAGNOSIS — M48061 Spinal stenosis, lumbar region without neurogenic claudication: Secondary | ICD-10-CM | POA: Diagnosis not present

## 2020-12-11 ENCOUNTER — Other Ambulatory Visit (HOSPITAL_COMMUNITY): Payer: Medicare HMO

## 2020-12-15 ENCOUNTER — Ambulatory Visit (HOSPITAL_COMMUNITY)
Admission: RE | Admit: 2020-12-15 | Discharge: 2020-12-15 | Disposition: A | Discharge status: Home / Self Care | Payer: Medicare HMO | Source: Ambulatory Visit | Attending: Cardiology | Admitting: Cardiology

## 2020-12-15 ENCOUNTER — Other Ambulatory Visit: Payer: Self-pay

## 2020-12-15 DIAGNOSIS — I110 Hypertensive heart disease with heart failure: Secondary | ICD-10-CM | POA: Diagnosis not present

## 2020-12-15 DIAGNOSIS — Z8673 Personal history of transient ischemic attack (TIA), and cerebral infarction without residual deficits: Secondary | ICD-10-CM | POA: Insufficient documentation

## 2020-12-15 DIAGNOSIS — I255 Ischemic cardiomyopathy: Secondary | ICD-10-CM | POA: Diagnosis not present

## 2020-12-15 DIAGNOSIS — I509 Heart failure, unspecified: Secondary | ICD-10-CM | POA: Diagnosis not present

## 2020-12-15 DIAGNOSIS — I2510 Atherosclerotic heart disease of native coronary artery without angina pectoris: Secondary | ICD-10-CM | POA: Insufficient documentation

## 2020-12-15 DIAGNOSIS — I4891 Unspecified atrial fibrillation: Secondary | ICD-10-CM | POA: Insufficient documentation

## 2020-12-15 LAB — ECHOCARDIOGRAM COMPLETE
Area-P 1/2: 3.37 cm2
S' Lateral: 2.3 cm

## 2020-12-15 NOTE — Progress Notes (Signed)
  Echocardiogram 2D Echocardiogram has been performed.  Kristin [name] M 12/15/2020, 10:17 AM

## 2020-12-19 ENCOUNTER — Other Ambulatory Visit: Payer: Self-pay

## 2020-12-19 DIAGNOSIS — I5022 Chronic systolic (congestive) heart failure: Secondary | ICD-10-CM

## 2020-12-19 MED ORDER — ISOSORB DINITRATE-HYDRALAZINE 20-37.5 MG PO TABS: 2.0000 | ORAL_TABLET | Freq: Three times a day (TID) | ORAL | 1 refills | Status: DC

## 2020-12-19 NOTE — Progress Notes (Signed)
Spoke with patient daughter (caregiver), patietn will be here at her scheduled appointment time.

## 2020-12-22 ENCOUNTER — Other Ambulatory Visit: Payer: Self-pay | Admitting: Cardiology

## 2020-12-22 DIAGNOSIS — I480 Paroxysmal atrial fibrillation: Secondary | ICD-10-CM

## 2020-12-22 NOTE — Telephone Encounter (Signed)
Do not refill.  She has an appt to see me on 12/30/2019. Lets do an ekg on arrival.  If she remains in NSR she may not need Amiodarone.

## 2020-12-22 NOTE — Telephone Encounter (Signed)
Please refill.

## 2020-12-27 ENCOUNTER — Ambulatory Visit: Payer: Medicare HMO | Admitting: Cardiology

## 2020-12-29 ENCOUNTER — Ambulatory Visit: Payer: Medicare HMO | Admitting: Cardiology

## 2020-12-29 ENCOUNTER — Encounter: Payer: Self-pay | Admitting: Cardiology

## 2020-12-29 ENCOUNTER — Other Ambulatory Visit: Payer: Self-pay

## 2020-12-29 VITALS — BP 129/63 | HR 73 | Temp 97.2°F | Resp 16 | Ht 64.0 in | Wt 198.0 lb

## 2020-12-29 DIAGNOSIS — E782 Mixed hyperlipidemia: Secondary | ICD-10-CM

## 2020-12-29 DIAGNOSIS — I5032 Chronic diastolic (congestive) heart failure: Secondary | ICD-10-CM | POA: Diagnosis not present

## 2020-12-29 DIAGNOSIS — Z8673 Personal history of transient ischemic attack (TIA), and cerebral infarction without residual deficits: Secondary | ICD-10-CM

## 2020-12-29 DIAGNOSIS — I480 Paroxysmal atrial fibrillation: Secondary | ICD-10-CM | POA: Diagnosis not present

## 2020-12-29 DIAGNOSIS — E1122 Type 2 diabetes mellitus with diabetic chronic kidney disease: Secondary | ICD-10-CM

## 2020-12-29 DIAGNOSIS — I255 Ischemic cardiomyopathy: Secondary | ICD-10-CM | POA: Diagnosis not present

## 2020-12-29 DIAGNOSIS — I2510 Atherosclerotic heart disease of native coronary artery without angina pectoris: Secondary | ICD-10-CM | POA: Diagnosis not present

## 2020-12-29 DIAGNOSIS — I252 Old myocardial infarction: Secondary | ICD-10-CM | POA: Diagnosis not present

## 2020-12-29 DIAGNOSIS — Z7901 Long term (current) use of anticoagulants: Secondary | ICD-10-CM | POA: Diagnosis not present

## 2020-12-29 DIAGNOSIS — N184 Chronic kidney disease, stage 4 (severe): Secondary | ICD-10-CM | POA: Diagnosis not present

## 2020-12-29 MED ORDER — LOSARTAN POTASSIUM 50 MG PO TABS: 50.0000 mg | ORAL_TABLET | Freq: Every day | ORAL | 0 refills | Status: DC

## 2020-12-29 MED ORDER — ISOSORB DINITRATE-HYDRALAZINE 20-37.5 MG PO TABS: 2.0000 | ORAL_TABLET | Freq: Three times a day (TID) | ORAL | 2 refills | Status: DC

## 2020-12-29 NOTE — Progress Notes (Signed)
[name] Date of Birth: 01-29-1941 MRN: [id number] Primary Care Provider:Hagler, Apolonio Schneiders, MD Primary Cardiologist: Rex Kras, DO, [facility] (established care 08/22/2020)  Date: 12/29/20 Last Office Visit: 10/27/2020  Chief Complaint  Patient presents with  . Follow-up  . Results    echo  . Congestive Heart Failure  . Coronary Artery Disease    HPI  Kristin [name] is a 80 y.o.  female who presents to the office with a chief complaint of "management of congestive heart failure, heart disease, review test results." Patient's past medical history and cardiovascular risk factors include: Paroxysmal atrial fibrillation, NSTEMI (08/2020), multivessel CAD, ischemic cardiomyopathy, chronic heart failure with reduced EF/stage C/NYHA class II, acute cerebellar stroke, lumbar stenosis status post laminectomy, hypertension, CKD stage IV, diabetes miellitus type 2, postmenopausal female, advanced age.   Patient presents to the office accompanied by her daughter Kristin [name].   She presented to [facility] in September 2021 with a chief complaint of bilateral lower extremity weakness and back pain.  She underwent imaging and was noted to have lumbar stenosis and underwent L2-L4 laminectomy on 08/19/2020.  She was doing well postoperatively until August 22, 2020 she started having symptoms of shortness of breath, chest x-ray noted pulmonary edema, EKG notable for new onset of atrial fibrillation, and troponins greater than 27,000 ruled in for non-STEMI.  Given the recent neurosurgery the surgical team recommended holding off full anticoagulation given the risk of bleeding.  Focused on rate control and rhythm control strategy and patient converted to normal sinus rhythm.  Prior to discharge she underwent left heart catheterization and was found to have multivessel CAD no interventions were performed as that she would not be a candidate for dual antiplatelet therapy for the same reasons noted  above.  The plan was to treat her medically. In addition, later during the hospitalization patient was doing well until she started experiencing dizziness underwent additional imaging and was found to have acute cerebellar stroke. She was discharged on 08/30/2020 and has been followed up in the office for close observation to prevent rehospitalization for CAD and congestive heart failure.  Her medications have been uptitrated to [facility] directed medical therapy.  Given the underlying chronic kidney disease stage IV family was reluctant to start ARB's/ARNIs until she follows up with nephrology.  In the interim she had a follow-up echocardiogram 3 months after her non-STEMI and LVEF has recovered.  She is here for 75-monthfollow-up.  During the last 3 months she has not had any chest pain or heart failure exacerbation.  She has not required the use of sublingual nitroglycerin tablets.  EKG today notes normal sinus rhythm and she has run out of her amiodarone as well.  She is doing well from a cardiovascular standpoint however she states that she has back pain and left leg pain which she is going to see her neurosurgeon later next week.  FUNCTIONAL STATUS: No structured exercise program or daily routine.   ALLERGIES: No Known Allergies  MEDICATION LIST PRIOR TO VISIT: Current Outpatient Medications on File Prior to Visit  Medication Sig Dispense Refill  . amoxicillin (AMOXIL) 500 MG capsule Take 500 mg by mouth 4 (four) times daily.    .Marland Kitchenaspirin EC 81 MG tablet Take 1 tablet (81 mg total) by mouth daily. Swallow whole. 90 tablet 3  . bumetanide (BUMEX) 1 MG tablet Take 1 tablet by mouth twice daily 60 tablet 6  . cholecalciferol (VITAMIN D) 25 MCG (1000 UNIT) tablet Take 3,000 Units by  mouth daily.    . colchicine 0.6 MG tablet Take 0.5 tablets (0.3 mg total) by mouth daily. 15 tablet 0  . diclofenac Sodium (VOLTAREN) 1 % GEL Apply 2 g topically 4 (four) times daily.    Marland Kitchen ELIQUIS 2.5 MG TABS tablet  Take 1 tablet by mouth twice daily 60 tablet 6  . gabapentin (NEURONTIN) 100 MG capsule Take 200 mg by mouth at bedtime.    Marland Kitchen HYDROcodone-acetaminophen (NORCO/VICODIN) 5-325 MG tablet Take 1 tablet by mouth every 6 (six) hours as needed. 10 tablet 0  . methocarbamol (ROBAXIN) 500 MG tablet Take 1 tablet (500 mg total) by mouth every 6 (six) hours as needed for muscle spasms. 30 tablet 0  . metoprolol succinate (TOPROL-XL) 50 MG 24 hr tablet Take 50 mg by mouth daily.    . Misc. Devices MISC Please dispense one power stair lift for at home daily use prn debility. 1 Device 0  . nitroGLYCERIN (NITROSTAT) 0.4 MG SL tablet Place 1 tablet (0.4 mg total) under the tongue every 5 (five) minutes as needed for chest pain. 30 tablet 0  . pantoprazole (PROTONIX) 40 MG tablet Take 1 tablet (40 mg total) by mouth daily. 30 tablet 0  . polyethylene glycol (MIRALAX / GLYCOLAX) 17 g packet Take 17 g by mouth daily as needed for moderate constipation. 14 each 0  . rosuvastatin (CRESTOR) 20 MG tablet Take 1 tablet (20 mg total) by mouth at bedtime. 90 tablet 1   No current facility-administered medications on file prior to visit.    PAST MEDICAL HISTORY: Past Medical History:  Diagnosis Date  . Atrial fibrillation ([place])   . CHF (congestive heart failure) ([place])   . Coronary artery disease   . Diabetes mellitus without complication ([place])   . Hypercholesteremia   . Hypertension   . Renal disorder    CKD Stage IV  . Stroke [facility])     PAST SURGICAL HISTORY: Past Surgical History:  Procedure Laterality Date  . LUMBAR LAMINECTOMY/DECOMPRESSION MICRODISCECTOMY N/A 08/19/2020   Procedure: LUMBAR LAMINECTOMY/DECOMPRESSION  Lumbar two-three, Lumbar three-four;  Surgeon: Dawley, Theodoro Doing, DO;  Location: [facility];  Service: Neurosurgery;  Laterality: N/A;  . RIGHT/LEFT HEART CATH AND CORONARY ANGIOGRAPHY N/A 08/23/2020   Procedure: RIGHT/LEFT HEART CATH AND CORONARY ANGIOGRAPHY;  Surgeon: [name], MD;  Location: [facility];  Service: Cardiovascular;  Laterality: N/A;    FAMILY HISTORY: The patient's family history includes Diabetes in her father; Heart attack in her father; Heart disease in her mother; Hypertension in her sister, sister, sister, sister, sister, sister, sister, sister, sister, sister, sister, and sister.   SOCIAL HISTORY:  The patient  reports that she has never smoked. She has never used smokeless tobacco. She reports that she does not drink alcohol and does not use drugs.  Review of Systems  Constitutional: Negative for chills and fever.  HENT: Negative for hoarse voice and nosebleeds.   Eyes: Negative for discharge, double vision and pain.  Cardiovascular: Negative for chest pain, claudication, dyspnea on exertion, leg swelling, near-syncope, orthopnea, palpitations, paroxysmal nocturnal dyspnea and syncope.  Respiratory: Negative for hemoptysis and shortness of breath.   Musculoskeletal: Positive for back pain and joint pain. Negative for muscle cramps and myalgias.       Unsteady with gait, but no falls.   Gastrointestinal: Negative for abdominal pain, constipation, diarrhea, hematemesis, hematochezia, melena, nausea and vomiting.  Neurological: Negative for dizziness and light-headedness.    PHYSICAL EXAM: Vitals with BMI 12/29/2020  10/27/2020 09/29/2020  Height '5\' 4"'$  '5\' 4"'$  '5\' 4"'$   Weight 198 lbs 191 lbs 194 lbs  BMI 33.97 [id number]  Systolic Q000111Q [id number]  Diastolic 63 55 69  Pulse 73 65 70   CONSTITUTIONAL: Age-appropriate female, hemodynamically stable, no acute distress.  SKIN: Skin is warm and dry. No rash noted. No cyanosis. No pallor. No jaundice HEAD: Normocephalic and atraumatic.  EYES: No scleral icterus MOUTH/THROAT: Moist oral membranes.  NECK: No JVD present. No thyromegaly noted. No carotid bruits  LYMPHATIC: No visible cervical adenopathy.  CHEST Normal respiratory effort. No intercostal retractions  LUNGS: Clear to auscultation bilaterally.  No  stridor. No wheezes. No rales.  CARDIOVASCULAR: Regular rate and rhythm, positive Q000111Q, soft holosystolic murmur heard at the apex, no rubs or gallops appreciated ABDOMINAL: Obese, soft, nontender, nondistended, positive bowel sounds in all 4 quadrants, no apparent ascites.  EXTREMITIES: No peripheral edema  HEMATOLOGIC: No significant bruising NEUROLOGIC: Oriented to person, place, and time. Nonfocal. Normal muscle tone.  PSYCHIATRIC: Normal mood and affect. Normal behavior. Cooperative  CARDIAC DATABASE: EKG: 09/29/2020: Normal sinus rhythm, 71 bpm, old anteroseptal infarct, nonspecific T wave abnormality, without underlying injury pattern.  Echocardiogram: 08/22/2020: 1. Left ventricular ejection fraction, by estimation, is 30 to 35%. The left ventricle has moderately decreased function. The left ventricle demonstrates regional wall motion abnormalities. Hyokinesis of base to mid anterior wall, base to mid anterospetal wall, apical septal, and apex. There is moderate left ventricular hypertrophy. Diastolic function is not assessed due to atrial fibrillation. No apparent left ventricular thrombus. 2. Right ventricular systolic function is normal. The right ventricular size is normal. There is moderately elevated pulmonary artery systolic pressure. The estimated right ventricular systolic pressure is [id number] mmHg. 3. The mitral valve is normal in structure. Mild mitral valve regurgitation. No evidence of mitral stenosis. 4. The aortic valve is normal in structure. Aortic valve regurgitation is not visualized. Mild aortic valve sclerosis is present, with no evidence of aortic valve stenosis. 5. The inferior vena cava is dilated in size with >50% respiratory variability, suggesting right atrial pressure of 8 mmHg.  12/15/2020 1. The distal inferoseptum and apex appear mildly hypokinetic, but WMA is much improved compared with prior study. LVEF has improved considerably. Left ventricular ejection  fraction, by estimation, is 55 to 60%. Left ventricular ejection fraction by 3D volume is 56%. The left ventricle has normal function. The left ventricle demonstrates regional wall motion abnormalities (see scoring diagram/findings for description). Left ventricular diastolic parameters are consistent with Grade I diastolic  dysfunction (impaired relaxation). 2. Right ventricular systolic function is normal. The right ventricular size is normal. There is normal pulmonary artery systolic pressure. The estimated right ventricular systolic pressure is [id number] mmHg. 3. The mitral valve is grossly normal. Trivial mitral valve regurgitation. No evidence of mitral stenosis. 4. The aortic valve is tricuspid. There is mild calcification of the aortic valve. There is mild thickening of the aortic valve. Aortic valve regurgitation is not visualized. Mild aortic valve sclerosis is present, with no evidence of aortic valve  stenosis. 5. The inferior vena cava is normal in size with greater than 50% respiratory variability, suggesting right atrial pressure of 3 mmHg.  Stress Testing:  No results found for this or any previous visit from the past 1095 days.  Heart Catheterization: Left and right heart catheterization 08/23/2020: RA 13/15, mean 12, RA saturation 56%; RV 51/8, EDP 12 mmHg; PA 54/26, mean 38 mmHg, PA saturation 45%; PW 21/21, mean 20 mmHg, aortic  saturation 97%. Cardiac output 4.96, cardiac index 2.51.  SVR elevated at 1258.  PVR mildly elevated at 290.  QP/QS 0.79. Left main: Mild disease. LAD: Proximal 80% stenosis followed by 100% occlusion.  Before the occlusion, at the subtotal site, there is a large diagonal that comes off and has secondary branches.  Apical LAD is barely visualized with minimal amount of collaterals.  Thrombus is evident at the site of occlusion. Circumflex: Large vessel, moderate-sized OM1 and moderate-sized OM 2.  Minimal disease. RCA: Ostial 80 to 90% stenosis.  Mild disease  in the remaining of the vessel, PL branch has a 50% stenosis of the proximal and ostial segment.  Severe dampening was evident with engagement of 5 French diagnostic catheter. LV: EDP mildly elevated at 15- 18 mmHg.  No pressure gradient across the aortic valve.  LVEF 30 to 35% with entire anterior, anterolateral, apical and apical inferior akinesis.  No significant MR.  LABORATORY DATA: CBC Latest Ref Rng & Units 09/25/2020 08/28/2020 08/27/2020  WBC 4.0 - 10.5 K/uL 7.2 10.7(H) 12.3(H)  Hemoglobin 12.0 - 15.0 g/dL 9.3(L) 8.7(L) 8.9(L)  Hematocrit 36.0 - 46.0 % 31.0(L) 27.9(L) 27.8(L)  Platelets 150 - 400 K/uL 266 387 328    CMP Latest Ref Rng & Units 10/27/2020 09/25/2020 08/29/2020  Glucose 65 - 99 mg/dL 105(H) 207(H) 126(H)  BUN 8 - 27 mg/dL 27 24(H) 33(H)  Creatinine 0.57 - 1.00 mg/dL 1.89(H) 2.08(H) 1.91(H)  Sodium 134 - 144 mmol/L 142 137 136  Potassium 3.5 - 5.2 mmol/L 4.2 3.9 4.7  Chloride 96 - 106 mmol/L 101 95(L) 96(L)  CO2 20 - 29 mmol/L '26 31 23  '$ Calcium 8.7 - 10.3 mg/dL 9.8 9.2 9.3  Total Protein 6.5 - 8.1 g/dL - 6.6 -  Total Bilirubin 0.3 - 1.2 mg/dL - 0.4 -  Alkaline Phos 38 - 126 U/L - 55 -  AST 15 - 41 U/L - 20 -  ALT 0 - 44 U/L - 12 -    Lipid Panel     Component Value Date/Time   CHOL 130 08/29/2020 0250   TRIG 89 08/29/2020 0250   HDL 47 08/29/2020 0250   CHOLHDL 2.8 08/29/2020 0250   VLDL 18 08/29/2020 0250   LDLCALC 65 08/29/2020 0250    Lab Results  Component Value Date   HGBA1C 6.7 (H) 08/27/2020   No components found for: NTPROBNP Lab Results  Component Value Date   TSH 1.054 08/22/2020   TSH 1.190 03/13/2020    Cardiac Panel (last 3 results) No results for input(s): CKTOTAL, CKMB, TROPONINIHS, RELINDX in the last 72 hours.  IMPRESSION:    ICD-10-CM   1. Paroxysmal atrial fibrillation (HCC)  I48.0   2. Long term (current) use of anticoagulants  Z79.01   3. Chronic heart failure with preserved ejection fraction (HFpEF) (HCC)  I50.32 EKG  12-Lead    losartan (COZAAR) 50 MG tablet    Basic metabolic panel    Magnesium    Pro b natriuretic peptide (BNP)    isosorbide-hydrALAZINE (BIDIL) 20-37.5 MG tablet  4. Recovered Ischemic cardiomyopathy  I25.5 losartan (COZAAR) 50 MG tablet  5. Atherosclerosis of native coronary artery of native heart without angina pectoris  I25.10 losartan (COZAAR) 50 MG tablet  6. Hx of non-ST elevation myocardial infarction (NSTEMI)  I25.2   7. Mixed hyperlipidemia  E78.2   8. Hx of stroke  Z86.73   9. Type 2 diabetes mellitus with stage 4 chronic kidney disease, without long-term current use of  insulin [facility])  E11.22    N18.4      RECOMMENDATIONS: Marcine Moorefield is a 80 y.o. female whose past medical history and cardiovascular risk factors include: Paroxysmal atrial fibrillation, NSTEMI (08/2020), multivessel CAD, ischemic cardiomyopathy, chronic heart failure with reduced EF/stage C/NYHA class II, acute cerebellar stroke, lumbar stenosis status post laminectomy, hypertension, CKD stage IV, diabetes miellitus type 2, postmenopausal female, advanced age.   Paroxysmal atrial fibrillation:  Currently normal sinus rhythm.  Continue Toprol-XL and Eliquis.   Will discontinue amiodarone for now and reevaluate in 3 months.   Will obtain EKG at the next office visit.    Long-term oral anticoagulation:  CHA2DS2-VASc SCORE is 9 which correlates to 15.2 % risk of stroke per year (CHF, hypertension, age greater than 23, diabetes, stroke, history of non-STEMI, female).  Reemphasized the risks, benefits, and alternatives to oral anticoagulation.   Chronic heart failure with preserved EF, stage C, NYHA class II:  Continue current medical therapy.  Discontinue amlodipine.  Refill Isordil  We will start losartan 50 mg p.o. every afternoon.  Patient has gained 7 pounds in the last visit.  Not uptitrating diuretic therapy as she also has symptoms of overactive bladder.  Recommend daily weight check,  strict I/O's  Fluid restriction to <2L per day, Na restriction < 2g per day  Check BMP, BNP, magnesium level in 1 week.  Established coronary artery disease without angina pectoris: Stable  Medications reconciled.  Sublingual nitroglycerin tablets to use on a as needed basis  Echocardiogram results reviewed with both the patient and her daughter at today's visit.  Type 2 diabetes mellitus, non-insulin-dependent: Currently managed by primary care provider.  Mixed Hyperlipidemia:  . Continue statin therapy.   . Follow lipids. . Currently managed by primary care provider. . Patient denies myalgia or other side effects. . Most recent lipid profile reviewed with the patient. Most recent AST and ALT values reviewed with the patient.  FINAL MEDICATION LIST END OF ENCOUNTER: Meds ordered this encounter  Medications  . losartan (COZAAR) 50 MG tablet    Sig: Take 1 tablet (50 mg total) by mouth at bedtime.    Dispense:  90 tablet    Refill:  0  . isosorbide-hydrALAZINE (BIDIL) 20-37.5 MG tablet    Sig: Take 2 tablets by mouth 3 (three) times daily.    Dispense:  180 tablet    Refill:  2    Medications Discontinued During This Encounter  Medication Reason  . amiodarone (PACERONE) 100 MG tablet Discontinued by provider  . amLODipine (NORVASC) 10 MG tablet Discontinued by provider  . isosorbide-hydrALAZINE (BIDIL) 20-37.5 MG tablet Reorder     Current Outpatient Medications:  .  amoxicillin (AMOXIL) 500 MG capsule, Take 500 mg by mouth 4 (four) times daily., Disp: , Rfl:  .  aspirin EC 81 MG tablet, Take 1 tablet (81 mg total) by mouth daily. Swallow whole., Disp: 90 tablet, Rfl: 3 .  bumetanide (BUMEX) 1 MG tablet, Take 1 tablet by mouth twice daily, Disp: 60 tablet, Rfl: 6 .  cholecalciferol (VITAMIN D) 25 MCG (1000 UNIT) tablet, Take 3,000 Units by mouth daily., Disp: , Rfl:  .  colchicine 0.6 MG tablet, Take 0.5 tablets (0.3 mg total) by mouth daily., Disp: 15 tablet, Rfl: 0 .   diclofenac Sodium (VOLTAREN) 1 % GEL, Apply 2 g topically 4 (four) times daily., Disp: , Rfl:  .  ELIQUIS 2.5 MG TABS tablet, Take 1 tablet by mouth twice daily, Disp: 60 tablet, Rfl: 6 .  gabapentin (NEURONTIN) 100 MG capsule, Take 200 mg by mouth at bedtime., Disp: , Rfl:  .  HYDROcodone-acetaminophen (NORCO/VICODIN) 5-325 MG tablet, Take 1 tablet by mouth every 6 (six) hours as needed., Disp: 10 tablet, Rfl: 0 .  losartan (COZAAR) 50 MG tablet, Take 1 tablet (50 mg total) by mouth at bedtime., Disp: 90 tablet, Rfl: 0 .  methocarbamol (ROBAXIN) 500 MG tablet, Take 1 tablet (500 mg total) by mouth every 6 (six) hours as needed for muscle spasms., Disp: 30 tablet, Rfl: 0 .  metoprolol succinate (TOPROL-XL) 50 MG 24 hr tablet, Take 50 mg by mouth daily., Disp: , Rfl:  .  Misc. Devices MISC, Please dispense one power stair lift for at home daily use prn debility., Disp: 1 Device, Rfl: 0 .  nitroGLYCERIN (NITROSTAT) 0.4 MG SL tablet, Place 1 tablet (0.4 mg total) under the tongue every 5 (five) minutes as needed for chest pain., Disp: 30 tablet, Rfl: 0 .  pantoprazole (PROTONIX) 40 MG tablet, Take 1 tablet (40 mg total) by mouth daily., Disp: 30 tablet, Rfl: 0 .  polyethylene glycol (MIRALAX / GLYCOLAX) 17 g packet, Take 17 g by mouth daily as needed for moderate constipation., Disp: 14 each, Rfl: 0 .  rosuvastatin (CRESTOR) 20 MG tablet, Take 1 tablet (20 mg total) by mouth at bedtime., Disp: 90 tablet, Rfl: 1 .  isosorbide-hydrALAZINE (BIDIL) 20-37.5 MG tablet, Take 2 tablets by mouth 3 (three) times daily., Disp: 180 tablet, Rfl: 2  Orders Placed This Encounter  Procedures  . Basic metabolic panel  . Magnesium  . Pro b natriuretic peptide (BNP)  . EKG 12-Lead   --Continue cardiac medications as reconciled in final medication list. --Return in about 3 months (around 03/29/2021) for Follow up, A. fib, CAD, EKG on arrival.. Or sooner if needed. --Continue follow-up with your primary care physician  regarding the management of your other chronic comorbid conditions.  Patient's questions and concerns were addressed to her satisfaction. She voices understanding of the instructions provided during this encounter.   This note was created using a voice recognition software as a result there may be grammatical errors inadvertently enclosed that do not reflect the nature of this encounter. Every attempt is made to correct such errors.  Rex Kras, [place], [facility]  Pager: [phone] Office: [phone]

## 2020-12-30 ENCOUNTER — Other Ambulatory Visit: Payer: Self-pay

## 2020-12-30 ENCOUNTER — Emergency Department (HOSPITAL_COMMUNITY): Payer: Medicare HMO

## 2020-12-30 ENCOUNTER — Encounter (HOSPITAL_COMMUNITY): Payer: Self-pay

## 2020-12-30 ENCOUNTER — Emergency Department (HOSPITAL_COMMUNITY)
Admission: EM | Admit: 2020-12-30 | Discharge: 2020-12-30 | Disposition: A | Discharge status: Home / Self Care | Payer: Medicare HMO | Attending: Emergency Medicine | Admitting: Emergency Medicine

## 2020-12-30 DIAGNOSIS — M545 Low back pain, unspecified: Secondary | ICD-10-CM | POA: Diagnosis not present

## 2020-12-30 DIAGNOSIS — E119 Type 2 diabetes mellitus without complications: Secondary | ICD-10-CM | POA: Insufficient documentation

## 2020-12-30 DIAGNOSIS — I509 Heart failure, unspecified: Secondary | ICD-10-CM | POA: Insufficient documentation

## 2020-12-30 DIAGNOSIS — Z7982 Long term (current) use of aspirin: Secondary | ICD-10-CM | POA: Diagnosis not present

## 2020-12-30 DIAGNOSIS — I4891 Unspecified atrial fibrillation: Secondary | ICD-10-CM | POA: Insufficient documentation

## 2020-12-30 DIAGNOSIS — Z7901 Long term (current) use of anticoagulants: Secondary | ICD-10-CM | POA: Insufficient documentation

## 2020-12-30 DIAGNOSIS — Z955 Presence of coronary angioplasty implant and graft: Secondary | ICD-10-CM | POA: Diagnosis not present

## 2020-12-30 DIAGNOSIS — Z79899 Other long term (current) drug therapy: Secondary | ICD-10-CM | POA: Insufficient documentation

## 2020-12-30 DIAGNOSIS — I130 Hypertensive heart and chronic kidney disease with heart failure and stage 1 through stage 4 chronic kidney disease, or unspecified chronic kidney disease: Secondary | ICD-10-CM | POA: Diagnosis not present

## 2020-12-30 DIAGNOSIS — I2510 Atherosclerotic heart disease of native coronary artery without angina pectoris: Secondary | ICD-10-CM | POA: Diagnosis not present

## 2020-12-30 DIAGNOSIS — M5442 Lumbago with sciatica, left side: Secondary | ICD-10-CM | POA: Diagnosis not present

## 2020-12-30 DIAGNOSIS — N184 Chronic kidney disease, stage 4 (severe): Secondary | ICD-10-CM | POA: Diagnosis not present

## 2020-12-30 MED ORDER — DIAZEPAM 2 MG PO TABS: 2.0000 mg | ORAL_TABLET | Freq: Four times a day (QID) | ORAL | 0 refills | Status: DC | PRN

## 2020-12-30 MED ORDER — HYDROCODONE-ACETAMINOPHEN 5-325 MG PO TABS: 1.0000 | ORAL_TABLET | ORAL | 0 refills | Status: DC | PRN

## 2020-12-30 MED ORDER — DIAZEPAM 5 MG PO TABS
5.0000 mg | ORAL_TABLET | Freq: Once | ORAL | Status: AC
  Administered 2020-12-30: 5 mg via ORAL
  Filled 2020-12-30: qty 1

## 2020-12-30 MED ORDER — MORPHINE SULFATE (PF) 4 MG/ML IV SOLN
4.0000 mg | Freq: Once | INTRAVENOUS | Status: AC
  Administered 2020-12-30: 4 mg via INTRAMUSCULAR
  Filled 2020-12-30: qty 1

## 2020-12-30 NOTE — ED Provider Notes (Signed)
[facility] DEPT Provider Note   CSN: [id number] Arrival date & time: 12/30/20  1008     History Chief Complaint  Patient presents with  . Back Pain    Kristin [name] is a 80 y.o. female.  80 year old female presents with left-sided lower lumbar spine pain times several days.  States has been atraumatic and not associated bowel or bladder dysfunction.  Does have history of back surgery.  Does use a cane and a walker occasionally.  Pain is characterizes sharp and worse with standing or movement.  She is not use any medications currently.  Denies any urinary symptoms        Past Medical History:  Diagnosis Date  . Atrial fibrillation ([place])   . CHF (congestive heart failure) ([place])   . Coronary artery disease   . Diabetes mellitus without complication ([place])   . Hypercholesteremia   . Hypertension   . Renal disorder    CKD Stage IV  . Stroke [facility])     Patient Active Problem List   Diagnosis Date Noted  . Unilateral primary osteoarthritis, right knee 10/03/2020  . Cerebral embolism with cerebral infarction 08/28/2020  . 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 AKI (acute kidney injury) ([place])   . Atherosclerosis of native coronary artery of native heart without angina pectoris   . NSTEMI (non-ST elevated myocardial infarction) ([place])   . Atrial fibrillation ([place])   . Mixed hyperlipidemia   . Elevated troponin   . Pulmonary edema   . Acute HFrEF (heart failure with reduced ejection fraction) ([place])   . Cardiomyopathy ([place])   . Effusion of right knee joint   . SIRS (systemic inflammatory response syndrome) ([place]) 08/18/2020  . Lower extremity weakness 08/18/2020  . Hypertension   . Chronic low back pain with bilateral sciatica     Past Surgical History:  Procedure Laterality Date  . LUMBAR LAMINECTOMY/DECOMPRESSION MICRODISCECTOMY N/A 08/19/2020   Procedure: LUMBAR LAMINECTOMY/DECOMPRESSION  Lumbar  two-three, Lumbar three-four;  Surgeon: Dawley, Theodoro Doing, DO;  Location: [facility];  Service: Neurosurgery;  Laterality: N/A;  . RIGHT/LEFT HEART CATH AND CORONARY ANGIOGRAPHY N/A 08/23/2020   Procedure: RIGHT/LEFT HEART CATH AND CORONARY ANGIOGRAPHY;  Surgeon: [name], MD;  Location: [facility];  Service: Cardiovascular;  Laterality: N/A;     OB History   No obstetric history on file.     Family History  Problem Relation Age of Onset  . Diabetes Father   . Heart attack Father   . Heart disease Mother   . Hypertension Sister   . Hypertension Sister   . Hypertension Sister   . Hypertension Sister   . Hypertension Sister   . Hypertension Sister   . Hypertension Sister   . Hypertension Sister   . Hypertension Sister   . Hypertension Sister   . Hypertension Sister   . Hypertension Sister     Social History   Tobacco Use  . Smoking status: Never Smoker  . Smokeless tobacco: Never Used  Vaping Use  . Vaping Use: Never used  Substance Use Topics  . Alcohol use: Never  . Drug use: Never    Home Medications Prior to Admission medications   Medication Sig Start Date End Date Taking? Authorizing Provider  amoxicillin (AMOXIL) 500 MG capsule Take 500 mg by mouth 4 (four) times daily. 12/20/20   [provider]  aspirin EC 81 MG tablet Take 1 tablet (81 mg  total) by mouth daily. Swallow whole. 10/27/20   Tolia, Sunit, DO  bumetanide (BUMEX) 1 MG tablet Take 1 tablet by mouth twice daily 11/23/20   [name], MD  cholecalciferol (VITAMIN D) 25 MCG (1000 UNIT) tablet Take 3,000 Units by mouth daily.    [provider]  colchicine 0.6 MG tablet Take 0.5 tablets (0.3 mg total) by mouth daily. 08/31/20   Aline August, MD  diclofenac Sodium (VOLTAREN) 1 % GEL Apply 2 g topically 4 (four) times daily. 08/30/20   Aline August, MD  ELIQUIS 2.5 MG TABS tablet Take 1 tablet by mouth twice daily 11/23/20   [name], MD  gabapentin (NEURONTIN) 100 MG capsule Take  200 mg by mouth at bedtime. 09/22/20   [provider]  HYDROcodone-acetaminophen (NORCO/VICODIN) 5-325 MG tablet Take 1 tablet by mouth every 6 (six) hours as needed. 09/26/20   Horton, Barbette Hair, MD  isosorbide-hydrALAZINE (BIDIL) 20-37.5 MG tablet Take 2 tablets by mouth 3 (three) times daily. 12/29/20 03/29/21  Tolia, Sunit, DO  losartan (COZAAR) 50 MG tablet Take 1 tablet (50 mg total) by mouth at bedtime. 12/29/20 03/29/21  Tolia, Sunit, DO  methocarbamol (ROBAXIN) 500 MG tablet Take 1 tablet (500 mg total) by mouth every 6 (six) hours as needed for muscle spasms. 08/30/20   Aline August, MD  metoprolol succinate (TOPROL-XL) 50 MG 24 hr tablet Take 50 mg by mouth daily. 09/22/20   [provider]  Misc. Devices MISC Please dispense one power stair lift for at home daily use prn debility. 03/15/20   Nicolette Bang, DO  nitroGLYCERIN (NITROSTAT) 0.4 MG SL tablet Place 1 tablet (0.4 mg total) under the tongue every 5 (five) minutes as needed for chest pain. 09/29/20   Tolia, Sunit, DO  pantoprazole (PROTONIX) 40 MG tablet Take 1 tablet (40 mg total) by mouth daily. 08/30/20   Aline August, MD  polyethylene glycol (MIRALAX / GLYCOLAX) 17 g packet Take 17 g by mouth daily as needed for moderate constipation. 08/30/20   Aline August, MD  rosuvastatin (CRESTOR) 20 MG tablet Take 1 tablet (20 mg total) by mouth at bedtime. 03/16/20   Nicolette Bang, DO    Allergies    Patient has no known allergies.  Review of Systems   Review of Systems  All other systems reviewed and are negative.   Physical Exam Updated Vital Signs BP (!) 149/81 (BP Location: Right Arm)   Pulse 63   Temp 98.3 F (36.8 C) (Oral)   Resp 16   SpO2 100%   Physical Exam Vitals and nursing note reviewed.  Constitutional:      General: She is not in acute distress.    Appearance: Normal appearance. She is well-developed and well-nourished. She is not toxic-appearing.  HENT:     Head:  Normocephalic and atraumatic.  Eyes:     General: Lids are normal.     Extraocular Movements: EOM normal.     Conjunctiva/sclera: Conjunctivae normal.     Pupils: Pupils are equal, round, and reactive to light.  Neck:     Thyroid: No thyroid mass.     Trachea: No tracheal deviation.  Cardiovascular:     Rate and Rhythm: Normal rate and regular rhythm.     Heart sounds: Normal heart sounds. No murmur heard. No gallop.   Pulmonary:     Effort: Pulmonary effort is normal. No respiratory distress.     Breath sounds: Normal breath sounds. No stridor. No decreased breath sounds,  wheezing, rhonchi or rales.  Abdominal:     General: Bowel sounds are normal. There is no distension.     Palpations: Abdomen is soft.     Tenderness: There is no abdominal tenderness. There is no CVA tenderness or rebound.  Musculoskeletal:        General: No tenderness or edema. Normal range of motion.     Cervical back: Normal range of motion and neck supple.       Back:  Skin:    General: Skin is warm and dry.     Findings: No abrasion or rash.  Neurological:     Mental Status: She is alert and oriented to person, place, and time.     GCS: GCS eye subscore is 4. GCS verbal subscore is 5. GCS motor subscore is 6.     Cranial Nerves: Cranial nerves are intact. No cranial nerve deficit.     Sensory: No sensory deficit.     Motor: Motor function is intact. No weakness.     Deep Tendon Reflexes: Strength normal.     Comments: Patient able to ambulate.  Tender at left SI joint  Psychiatric:        Mood and Affect: Mood and affect normal.        Speech: Speech normal.        Behavior: Behavior normal.     ED Results / Procedures / Treatments   Labs (all labs ordered are listed, but only abnormal results are displayed) Labs Reviewed - No data to display  EKG None  Radiology DG Lumbar Spine Complete  Result Date: 12/30/2020 CLINICAL DATA:  Back pain with left-sided radiculopathy EXAM: LUMBAR SPINE -  COMPLETE 4+ VIEW COMPARISON:  08/18/2020 FINDINGS: Five lumbar type vertebral segments. Vertebral body heights are maintained. No fracture identified. Unchanged mild grade 1 anterolisthesis L2 on L3 and L3 on L4. Evidence of interval posterior decompression at the L2-3 and L3-4 levels. Multilevel mild intervertebral disc space loss with associated degenerative endplate changes. Advanced multilevel lumbar facet arthrosis. Abdominal aortic atherosclerotic calcification. IMPRESSION: 1. Interval posterior decompression at the L2-3 and L3-4 levels. 2. Multilevel facet predominant lumbar spondylosis. Electronically Signed   By: Davina Poke D.O.   On: 12/30/2020 16:46    Procedures Procedures (including critical care time)  Medications Ordered in ED Medications  diazepam (VALIUM) tablet 5 mg (has no administration in time range)  morphine 4 MG/ML injection 4 mg (has no administration in time range)    ED Course  I have reviewed the triage vital signs and the nursing notes.  Pertinent labs & imaging results that were available during my care of the patient were reviewed by me and considered in my medical decision making (see chart for details).    MDM Rules/Calculators/A&P                          LS-spine series without acute fracture.  No neurological findings at this time.  Has medicated patient here and will prescribe medications have her follow-up with her doctor Final Clinical Impression(s) / ED Diagnoses Final diagnoses:  None    Rx / DC Orders ED Discharge Orders    None       Lacretia Leigh, MD 12/30/20 [place] [facility]

## 2020-12-30 NOTE — ED Triage Notes (Signed)
Pt reports having back surgery last year and has not had any problems until a few days ago when her lower back began to hurt and the pain now radiates down her left leg.

## 2020-12-31 ENCOUNTER — Telehealth: Payer: Self-pay

## 2020-12-31 NOTE — Telephone Encounter (Signed)
Ph[facility] called to confirm valium prescription  Chart reviewed, had valium here and norco with good result. Verified script

## 2021-01-03 DIAGNOSIS — R5381 Other malaise: Secondary | ICD-10-CM | POA: Diagnosis not present

## 2021-01-03 DIAGNOSIS — I5022 Chronic systolic (congestive) heart failure: Secondary | ICD-10-CM | POA: Diagnosis not present

## 2021-01-03 DIAGNOSIS — M48061 Spinal stenosis, lumbar region without neurogenic claudication: Secondary | ICD-10-CM | POA: Diagnosis not present

## 2021-01-04 DIAGNOSIS — I10 Essential (primary) hypertension: Secondary | ICD-10-CM | POA: Diagnosis not present

## 2021-01-04 DIAGNOSIS — Z6833 Body mass index (BMI) 33.0-33.9, adult: Secondary | ICD-10-CM | POA: Diagnosis not present

## 2021-01-04 DIAGNOSIS — M48062 Spinal stenosis, lumbar region with neurogenic claudication: Secondary | ICD-10-CM | POA: Diagnosis not present

## 2021-01-04 DIAGNOSIS — M47816 Spondylosis without myelopathy or radiculopathy, lumbar region: Secondary | ICD-10-CM | POA: Diagnosis not present

## 2021-01-04 DIAGNOSIS — M545 Low back pain, unspecified: Secondary | ICD-10-CM | POA: Diagnosis not present

## 2021-01-05 DIAGNOSIS — I5032 Chronic diastolic (congestive) heart failure: Secondary | ICD-10-CM | POA: Diagnosis not present

## 2021-01-06 LAB — BASIC METABOLIC PANEL
BUN/Creatinine Ratio: 20 (ref 12–28)
BUN: 48 mg/dL — ABNORMAL HIGH (ref 8–27)
CO2: 29 mmol/L (ref 20–29)
Calcium: 9.8 mg/dL (ref 8.7–10.3)
Chloride: 99 mmol/L (ref 96–106)
Creatinine, Ser: 2.4 mg/dL — ABNORMAL HIGH (ref 0.57–1.00)
GFR calc Af Amer: 21 mL/min/{1.73_m2} — ABNORMAL LOW (ref >59)
GFR calc non Af Amer: 19 mL/min/{1.73_m2} — ABNORMAL LOW (ref >59)
Glucose: 86 mg/dL (ref 65–99)
Potassium: 4.1 mmol/L (ref 3.5–5.2)
Sodium: 145 mmol/L — ABNORMAL HIGH (ref 134–144)

## 2021-01-06 LAB — MAGNESIUM: Magnesium: 2.2 mg/dL (ref 1.6–2.3)

## 2021-01-06 LAB — PRO B NATRIURETIC PEPTIDE: NT-Pro BNP: 464 pg/mL (ref 0–738)

## 2021-01-10 ENCOUNTER — Telehealth: Payer: Self-pay | Admitting: Cardiology

## 2021-01-10 ENCOUNTER — Other Ambulatory Visit: Payer: Self-pay | Admitting: Cardiology

## 2021-01-10 DIAGNOSIS — N179 Acute kidney failure, unspecified: Secondary | ICD-10-CM

## 2021-01-10 NOTE — Telephone Encounter (Signed)
I spoke to patient's pharmacy apparently whoever dispensed med didn't see patient had a new refill for 30 days they automatically gave her what she was given before but pharmacy states patient should be good till 2/12 they will dispense the 30 days I told them to try for 90 days they said they will once she is due they are just not sure if her insurance will approve for 90 days. I will call patient to let her know

## 2021-01-10 NOTE — Telephone Encounter (Signed)
Please call the patient's daughter and organize BiDil prescription so her medical therapy is uninterrupted.  ST

## 2021-01-22 ENCOUNTER — Other Ambulatory Visit: Payer: Self-pay | Admitting: Cardiology

## 2021-01-22 DIAGNOSIS — I5032 Chronic diastolic (congestive) heart failure: Secondary | ICD-10-CM

## 2021-01-23 DIAGNOSIS — M545 Low back pain, unspecified: Secondary | ICD-10-CM | POA: Diagnosis not present

## 2021-01-26 DIAGNOSIS — N179 Acute kidney failure, unspecified: Secondary | ICD-10-CM | POA: Diagnosis not present

## 2021-01-27 LAB — BASIC METABOLIC PANEL
BUN/Creatinine Ratio: 18 (ref 12–28)
BUN: 41 mg/dL — ABNORMAL HIGH (ref 8–27)
CO2: 28 mmol/L (ref 20–29)
Calcium: 9.9 mg/dL (ref 8.7–10.3)
Chloride: 98 mmol/L (ref 96–106)
Creatinine, Ser: 2.22 mg/dL — ABNORMAL HIGH (ref 0.57–1.00)
GFR calc Af Amer: 23 mL/min/{1.73_m2} — ABNORMAL LOW (ref >59)
GFR calc non Af Amer: 20 mL/min/{1.73_m2} — ABNORMAL LOW (ref >59)
Glucose: 80 mg/dL (ref 65–99)
Potassium: 4.3 mmol/L (ref 3.5–5.2)
Sodium: 144 mmol/L (ref 134–144)

## 2021-01-29 DIAGNOSIS — I10 Essential (primary) hypertension: Secondary | ICD-10-CM | POA: Diagnosis not present

## 2021-01-29 DIAGNOSIS — Z6834 Body mass index (BMI) 34.0-34.9, adult: Secondary | ICD-10-CM | POA: Diagnosis not present

## 2021-01-29 DIAGNOSIS — M47816 Spondylosis without myelopathy or radiculopathy, lumbar region: Secondary | ICD-10-CM | POA: Diagnosis not present

## 2021-01-29 DIAGNOSIS — M545 Low back pain, unspecified: Secondary | ICD-10-CM | POA: Diagnosis not present

## 2021-02-03 DIAGNOSIS — I5022 Chronic systolic (congestive) heart failure: Secondary | ICD-10-CM | POA: Diagnosis not present

## 2021-02-03 DIAGNOSIS — R5381 Other malaise: Secondary | ICD-10-CM | POA: Diagnosis not present

## 2021-02-03 DIAGNOSIS — M48061 Spinal stenosis, lumbar region without neurogenic claudication: Secondary | ICD-10-CM | POA: Diagnosis not present

## 2021-02-09 DIAGNOSIS — N184 Chronic kidney disease, stage 4 (severe): Secondary | ICD-10-CM | POA: Diagnosis not present

## 2021-02-09 DIAGNOSIS — I480 Paroxysmal atrial fibrillation: Secondary | ICD-10-CM | POA: Diagnosis not present

## 2021-02-09 DIAGNOSIS — I5020 Unspecified systolic (congestive) heart failure: Secondary | ICD-10-CM | POA: Diagnosis not present

## 2021-02-09 DIAGNOSIS — I2510 Atherosclerotic heart disease of native coronary artery without angina pectoris: Secondary | ICD-10-CM | POA: Diagnosis not present

## 2021-02-09 DIAGNOSIS — I639 Cerebral infarction, unspecified: Secondary | ICD-10-CM | POA: Diagnosis not present

## 2021-02-09 DIAGNOSIS — I129 Hypertensive chronic kidney disease with stage 1 through stage 4 chronic kidney disease, or unspecified chronic kidney disease: Secondary | ICD-10-CM | POA: Diagnosis not present

## 2021-02-09 DIAGNOSIS — E1122 Type 2 diabetes mellitus with diabetic chronic kidney disease: Secondary | ICD-10-CM | POA: Diagnosis not present

## 2021-03-03 DIAGNOSIS — M48061 Spinal stenosis, lumbar region without neurogenic claudication: Secondary | ICD-10-CM | POA: Diagnosis not present

## 2021-03-03 DIAGNOSIS — R5381 Other malaise: Secondary | ICD-10-CM | POA: Diagnosis not present

## 2021-03-03 DIAGNOSIS — I5022 Chronic systolic (congestive) heart failure: Secondary | ICD-10-CM | POA: Diagnosis not present

## 2021-03-05 ENCOUNTER — Other Ambulatory Visit: Payer: Self-pay | Admitting: Cardiology

## 2021-03-05 DIAGNOSIS — I5032 Chronic diastolic (congestive) heart failure: Secondary | ICD-10-CM

## 2021-03-16 ENCOUNTER — Ambulatory Visit
Admission: RE | Admit: 2021-03-16 | Discharge: 2021-03-16 | Disposition: A | Discharge status: Home / Self Care | Payer: Medicare HMO | Source: Ambulatory Visit | Attending: Family Medicine | Admitting: Family Medicine

## 2021-03-16 ENCOUNTER — Other Ambulatory Visit: Payer: Self-pay | Admitting: Family Medicine

## 2021-03-16 DIAGNOSIS — R0602 Shortness of breath: Secondary | ICD-10-CM

## 2021-03-16 DIAGNOSIS — G479 Sleep disorder, unspecified: Secondary | ICD-10-CM | POA: Diagnosis not present

## 2021-04-03 DIAGNOSIS — I5022 Chronic systolic (congestive) heart failure: Secondary | ICD-10-CM | POA: Diagnosis not present

## 2021-04-03 DIAGNOSIS — M48061 Spinal stenosis, lumbar region without neurogenic claudication: Secondary | ICD-10-CM | POA: Diagnosis not present

## 2021-04-03 DIAGNOSIS — R5381 Other malaise: Secondary | ICD-10-CM | POA: Diagnosis not present

## 2021-04-03 NOTE — Progress Notes (Signed)
Kristin [name] Date of Birth: March 15, 1941 MRN: [id number] Primary Care Provider:Hagler, Kristin Schneiders, MD Primary Cardiologist: Kristin Kras, DO, [facility] (established care 08/22/2020)  Date: 04/06/21 Last Office Visit: 12/29/2020  Chief Complaint  Patient presents with  . Atrial Fibrillation  . Coronary Artery Disease  . Follow-up    3 month    HPI  Kristin [name] is a 80 y.o.  female who presents to the office with a chief complaint of " 61-monthfollow-up for management of CAD, recovered ischemic cardiomyopathy, and paroxysmal atrial fibrillation." Patient's past medical history and cardiovascular risk factors include: Paroxysmal atrial fibrillation, NSTEMI (08/2020), multivessel CAD, recovered ischemic cardiomyopathy, chronic HFpEF/stage C/NYHA class II, acute cerebellar stroke, lumbar stenosis status post laminectomy, hypertension, CKD stage IV, diabetes miellitus type 2, postmenopausal female, advanced age.   Patient presents to the office accompanied by her daughter [name]   [facility] with bilateral lower extremity weakness and back pain.  She underwent imaging and was noted to have lumbar stenosis and underwent L2-L4 laminectomy on 08/19/2020.  Post-op developed shortness of breath, chest x-ray noted pulmonary edema, EKG notable for new onset of atrial fibrillation, and troponins greater than 27,000 ruled in for non-STEMI. Given the recent neurosurgery the surgical team recommended holding off full anticoagulation given the risk of bleeding.    Patient was treated with rate control strategy and due to not being on oral anticoagulation and acute cerebellar stroke during the hospitalization.  In addition, prior to discharge underwent left heart catheterization was found to have multivessel CAD; however, no interventions were performed as she was not a candidate for dual antiplatelet therapy due to the recent neurosurgery.   Patient was stabilized and  discharged home.  387-monthollow-up visit had an echocardiogram which noted preserved LVEF with grade 1 diastolic impairment.  She intermittently has a chest discomfort for which she takes sublingual nitroglycerin tablets.  At the last office visit we initiated her on losartan to improve her guideline directed medical therapy; however, given her CKD she had uptrending of creatinine and I recommended discontinuation of ARB.  Patient followed up with her nephrologist who is okay with initiating losartan as it is GDMT and slight bump in creatinine is anticipated upward to 20% and should stabilize thereafter.    Since last office visit patient states that she is not doing as well comparative to the last visit.  She is complaining of both cardiac and noncardiac symptoms.  Noncardiac symptoms include blurred vision, difficulty hearing, cough, and paresthesias in the hands and legs.  I have deferred this to her primary care provider or her other subspecialty providers.  With regards to shortness of breath patient states that she is been having it soon after see me the last time but did not bring it up to her daughter's attention until recently.  Patient states that the shortness of breath is mostly with effort related activities.  She denies lower extremity swelling, orthopnea, or paroxysmal nocturnal dyspnea.  She is compliant with her medical therapy and has required the use of sublingual nitroglycerin tablets twice since the last office visit 3 months ago.  She does not recall the symptoms of her chest discomfort to give me additional details.  FUNCTIONAL STATUS: No structured exercise program or daily routine.   ALLERGIES: No Known Allergies  MEDICATION LIST PRIOR TO VISIT: Current Outpatient Medications on File Prior to Visit  Medication Sig Dispense Refill  . aspirin EC 81 MG tablet Take 1 tablet (  81 mg total) by mouth daily. Swallow whole. 90 tablet 3  . BIDIL 20-37.5 MG tablet TAKE 2 TABLETS BY  MOUTH THREE TIMES DAILY 90 tablet 0  . bumetanide (BUMEX) 1 MG tablet Take 1 tablet by mouth twice daily 60 tablet 6  . cholecalciferol (VITAMIN D) 25 MCG (1000 UNIT) tablet Take 3,000 Units by mouth daily.    . colchicine 0.6 MG tablet Take 0.5 tablets (0.3 mg total) by mouth daily. 15 tablet 0  . diclofenac Sodium (VOLTAREN) 1 % GEL Apply 2 g topically 4 (four) times daily.    Marland Kitchen ELIQUIS 2.5 MG TABS tablet Take 1 tablet by mouth twice daily 60 tablet 6  . gabapentin (NEURONTIN) 100 MG capsule Take 200 mg by mouth at bedtime.    . methocarbamol (ROBAXIN) 500 MG tablet Take 1 tablet (500 mg total) by mouth every 6 (six) hours as needed for muscle spasms. 30 tablet 0  . metoprolol succinate (TOPROL-XL) 50 MG 24 hr tablet Take 50 mg by mouth daily.    . Misc. Devices MISC Please dispense one power stair lift for at home daily use prn debility. 1 Device 0  . nitroGLYCERIN (NITROSTAT) 0.4 MG SL tablet Place 1 tablet (0.4 mg total) under the tongue every 5 (five) minutes as needed for chest pain. 30 tablet 0  . pantoprazole (PROTONIX) 40 MG tablet Take 1 tablet (40 mg total) by mouth daily. 30 tablet 0  . rosuvastatin (CRESTOR) 20 MG tablet Take 1 tablet (20 mg total) by mouth at bedtime. 90 tablet 1  . traZODone (DESYREL) 50 MG tablet Take 1 tablet by mouth at bedtime.     No current facility-administered medications on file prior to visit.    PAST MEDICAL HISTORY: Past Medical History:  Diagnosis Date  . Atrial fibrillation ([place])   . CHF (congestive heart failure) ([place])   . Coronary artery disease   . Diabetes mellitus without complication ([place])   . Hypercholesteremia   . Hypertension   . Renal disorder    CKD Stage IV  . Stroke [facility])     PAST SURGICAL HISTORY: Past Surgical History:  Procedure Laterality Date  . LUMBAR LAMINECTOMY/DECOMPRESSION MICRODISCECTOMY N/A 08/19/2020   Procedure: LUMBAR LAMINECTOMY/DECOMPRESSION  Lumbar two-three, Lumbar three-four;  Surgeon: Dawley, Theodoro Doing,  DO;  Location: [facility];  Service: Neurosurgery;  Laterality: N/A;  . RIGHT/LEFT HEART CATH AND CORONARY ANGIOGRAPHY N/A 08/23/2020   Procedure: RIGHT/LEFT HEART CATH AND CORONARY ANGIOGRAPHY;  Surgeon: [name], MD;  Location: [place] [facility];  Service: Cardiovascular;  Laterality: N/A;    FAMILY HISTORY: The patient's family history includes Diabetes in her father; Heart attack in her father; Heart disease in her mother; Hypertension in her sister, sister, sister, sister, sister, sister, sister, sister, sister, sister, sister, and sister.   SOCIAL HISTORY:  The patient  reports that she has never smoked. She has never used smokeless tobacco. She reports that she does not drink alcohol and does not use drugs.  Review of Systems  Constitutional: Negative for chills and fever.  HENT: Negative for hoarse voice and nosebleeds.        Difficultly hearing   Eyes: Positive for blurred vision. Negative for discharge, double vision and pain.  Cardiovascular: Negative for chest pain, claudication, dyspnea on exertion, leg swelling, near-syncope, orthopnea, palpitations, paroxysmal nocturnal dyspnea and syncope.  Respiratory: Positive for shortness of breath. Negative for hemoptysis.   Musculoskeletal: Positive for back pain and joint pain. Negative for muscle cramps and  myalgias.       Unsteady with gait, but no falls.   Gastrointestinal: Negative for abdominal pain, constipation, diarrhea, hematemesis, hematochezia, melena, nausea and vomiting.  Neurological: Positive for paresthesias (hands and legs). Negative for dizziness and light-headedness.    PHYSICAL EXAM: Vitals with BMI 04/06/2021 12/30/2020 12/30/2020  Height 5' 4"  - -  Weight 208 lbs - -  BMI 37.90 - -  Systolic 240 973 532  Diastolic 72 86 81  Pulse 78 64 63   CONSTITUTIONAL: Age-appropriate female, hemodynamically stable, no acute distress.  SKIN: Skin is warm and dry. No rash noted. No cyanosis. No pallor. No jaundice HEAD:  Normocephalic and atraumatic.  EYES: No scleral icterus MOUTH/THROAT: Moist oral membranes.  NECK: No JVD present. No thyromegaly noted. No carotid bruits  LYMPHATIC: No visible cervical adenopathy.  CHEST Normal respiratory effort. No intercostal retractions  LUNGS: Clear to auscultation bilaterally.  No stridor. No wheezes. No rales.  CARDIOVASCULAR: Regular rate and rhythm, positive D9-M4, soft holosystolic murmur heard at the apex, no rubs or gallops appreciated ABDOMINAL: Obese, soft, nontender, nondistended, positive bowel sounds in all 4 quadrants, no apparent ascites.  EXTREMITIES: No peripheral edema  HEMATOLOGIC: No significant bruising NEUROLOGIC: Oriented to person, place, and time. Nonfocal. Normal muscle tone.  PSYCHIATRIC: Normal mood and affect. Normal behavior. Cooperative  CARDIAC DATABASE: EKG: 04/06/2021: Normal sinus rhythm, 76 bpm, consider old anteroseptal infarct, without underlying injury pattern.  Echocardiogram: 12/15/2020 1. The distal inferoseptum and apex appear mildly hypokinetic, but WMA is much improved compared with prior study. LVEF has improved considerably. Left ventricular ejection fraction, by estimation, is 55 to 60%. Left ventricular ejection fraction by 3D volume is 56%. The left ventricle has normal function. The left ventricle demonstrates regional wall motion abnormalities (see scoring diagram/findings for description). Left ventricular diastolic parameters are consistent with Grade I diastolic dysfunction (impaired relaxation). 2. Right ventricular systolic function is normal. The right ventricular size is normal. There is normal pulmonary artery systolic pressure. The estimated right ventricular systolic pressure is 26.8 mmHg. 3. The mitral valve is grossly normal. Trivial mitral valve regurgitation. No evidence of mitral stenosis. 4. The aortic valve is tricuspid. There is mild calcification of the aortic valve. There is mild thickening of the  aortic valve. Aortic valve regurgitation is not visualized. Mild aortic valve sclerosis is present, with no evidence of aortic valve stenosis. 5. The inferior vena cava is normal in size with greater than 50% respiratory variability, suggesting right atrial pressure of 3 mmHg.  Stress Testing:  No results found for this or any previous visit from the past 1095 days.  Heart Catheterization: Left and right heart catheterization 08/23/2020: RA 13/15, mean 12, RA saturation 56%; RV 51/8, EDP 12 mmHg; PA 54/26, mean 38 mmHg, PA saturation 45%; PW 21/21, mean 20 mmHg, aortic saturation 97%. Cardiac output 4.96, cardiac index 2.51.  SVR elevated at 1258.  PVR mildly elevated at 290.  QP/QS 0.79. Left main: Mild disease. LAD: Proximal 80% stenosis followed by 100% occlusion.  Before the occlusion, at the subtotal site, there is a large diagonal that comes off and has secondary branches.  Apical LAD is barely visualized with minimal amount of collaterals.  Thrombus is evident at the site of occlusion. Circumflex: Large vessel, moderate-sized OM1 and moderate-sized OM 2.  Minimal disease. RCA: Ostial 80 to 90% stenosis.  Mild disease in the remaining of the vessel, PL branch has a 50% stenosis of the proximal and ostial segment.  Severe dampening was  evident with engagement of 5 French diagnostic catheter. LV: EDP mildly elevated at 15- 18 mmHg.  No pressure gradient across the aortic valve.  LVEF 30 to 35% with entire anterior, anterolateral, apical and apical inferior akinesis.  No significant MR.  LABORATORY DATA: CBC Latest Ref Rng & Units 09/25/2020 08/28/2020 08/27/2020  WBC 4.0 - 10.5 K/uL 7.2 10.7(H) 12.3(H)  Hemoglobin 12.0 - 15.0 g/dL 9.3(L) 8.7(L) 8.9(L)  Hematocrit 36.0 - 46.0 % 31.0(L) 27.9(L) 27.8(L)  Platelets 150 - 400 K/uL 266 387 328    CMP Latest Ref Rng & Units 01/26/2021 01/05/2021 10/27/2020  Glucose 65 - 99 mg/dL 80 86 105(H)  BUN 8 - 27 mg/dL 41(H) 48(H) 27  Creatinine 0.57 - 1.00  mg/dL 2.22(H) 2.40(H) 1.89(H)  Sodium 134 - 144 mmol/L 144 145(H) 142  Potassium 3.5 - 5.2 mmol/L 4.3 4.1 4.2  Chloride 96 - 106 mmol/L 98 99 101  CO2 20 - 29 mmol/L 28 29 26   Calcium 8.7 - 10.3 mg/dL 9.9 9.8 9.8  Total Protein 6.5 - 8.1 g/dL - - -  Total Bilirubin 0.3 - 1.2 mg/dL - - -  Alkaline Phos 38 - 126 U/L - - -  AST 15 - 41 U/L - - -  ALT 0 - 44 U/L - - -    Lipid Panel     Component Value Date/Time   CHOL 130 08/29/2020 0250   TRIG 89 08/29/2020 0250   HDL 47 08/29/2020 0250   CHOLHDL 2.8 08/29/2020 0250   VLDL 18 08/29/2020 0250   LDLCALC 65 08/29/2020 0250    Lab Results  Component Value Date   HGBA1C 6.7 (H) 08/27/2020   No components found for: NTPROBNP Lab Results  Component Value Date   TSH 1.054 08/22/2020   TSH 1.190 03/13/2020    Cardiac Panel (last 3 results) No results for input(s): CKTOTAL, CKMB, TROPONINIHS, RELINDX in the last 72 hours.  External Labs: Collected: 03/16/2021 performed by her PCP Creatinine 2.02 mg/dL. eGFR: 24 mL/min per 1.73 m Hemoglobin 12 g/dL, hematocrit 36.9% Sodium 145, potassium 3.6, chloride 101, bicarb 36, BUN 38 TSH 1.62  IMPRESSION:    ICD-10-CM   1. Paroxysmal atrial fibrillation (HCC)  I48.0 EKG 12-Lead  2. Long term (current) use of anticoagulants  Z79.01   3. Chronic heart failure with preserved ejection fraction (HFpEF) (HCC)  I50.32 losartan (COZAAR) 25 MG tablet    Basic metabolic panel    Magnesium    Pro b natriuretic peptide (BNP)  4. Recovered Ischemic cardiomyopathy  I25.5 losartan (COZAAR) 25 MG tablet    Basic metabolic panel    Magnesium    Pro b natriuretic peptide (BNP)  5. Atherosclerosis of native coronary artery of native heart without angina pectoris  I25.10   6. Hx of non-ST elevation myocardial infarction (NSTEMI)  K09.3 Basic metabolic panel    Magnesium    Pro b natriuretic peptide (BNP)  7. Mixed hyperlipidemia  E78.2   8. Hx of stroke  Z86.73   9. Type 2 diabetes mellitus with  stage 4 chronic kidney disease, without long-term current use of insulin (HCC)  E11.22    N18.4   10. Cough  R05.9 benzonatate (TESSALON) 100 MG capsule     RECOMMENDATIONS: Kristin [name] is a 80 y.o. female whose past medical history and cardiovascular risk factors include: Paroxysmal atrial fibrillation, NSTEMI (08/2020), multivessel CAD, ischemic cardiomyopathy, chronic heart failure with reduced EF/stage C/NYHA class II, acute cerebellar stroke, lumbar stenosis status post laminectomy,  hypertension, CKD stage IV, diabetes miellitus type 2, postmenopausal female, advanced age.   Paroxysmal atrial fibrillation:   Currently normal sinus rhythm.   Continue Toprol-XL and Eliquis.    She remains in normal sinus rhythm despite discontinuation of amiodarone.  Long-term oral anticoagulation:  CHA2DS2-VASc SCORE is 9 which correlates to 15.2 % risk of stroke per year (CHF, hypertension, age greater than 28, diabetes, stroke, history of non-STEMI, female).  Reemphasized the risks, benefits, and alternatives to oral anticoagulation.   Chronic heart failure with preserved EF, stage C, NYHA class II:  Continue current medical therapy.  Medications reconciled.  Refilled BiDil  Will restart losartan at 25 mg p.o. every afternoon  Not uptitrating diuretic therapy as she also has symptoms of overactive bladder.  Recommend daily weight check, strict I/O's  Fluid restriction to <2L per day, Na restriction < 2g per day  Check BMP, BNP, magnesium level in 1 week.  Established coronary artery disease without angina pectoris: Stable  Medications reconciled.  Sublingual nitroglycerin tablets to use on a as needed basis  Echocardiogram results reviewed with both the patient and her daughter at today's visit.  Type 2 diabetes mellitus, non-insulin-dependent: Currently managed by primary care provider.  Mixed Hyperlipidemia:  . Continue statin therapy.   . Follow lipids. . Currently  managed by primary care provider. . Patient denies myalgia or other side effects. . Most recent lipid profile reviewed with the patient. Most recent AST and ALT values reviewed with the patient.  Patient is also requesting a refill on Tessalon Perles which have helped her cough in the recent past.  I informed the patient that I will prescribe it once but she should follow-up with her PCP for additional refills if required.  External labs independently reviewed as part of today's office visit and for available in Care Everywhere  FINAL MEDICATION LIST END OF ENCOUNTER: Meds ordered this encounter  Medications  . benzonatate (TESSALON) 100 MG capsule    Sig: Take 1 capsule (100 mg total) by mouth 2 (two) times daily as needed for cough.    Dispense:  20 capsule    Refill:  0  . losartan (COZAAR) 25 MG tablet    Sig: Take 1 tablet (25 mg total) by mouth daily.    Dispense:  90 tablet    Refill:  0     Current Outpatient Medications:  .  aspirin EC 81 MG tablet, Take 1 tablet (81 mg total) by mouth daily. Swallow whole., Disp: 90 tablet, Rfl: 3 .  benzonatate (TESSALON) 100 MG capsule, Take 1 capsule (100 mg total) by mouth 2 (two) times daily as needed for cough., Disp: 20 capsule, Rfl: 0 .  BIDIL 20-37.5 MG tablet, TAKE 2 TABLETS BY MOUTH THREE TIMES DAILY, Disp: 90 tablet, Rfl: 0 .  bumetanide (BUMEX) 1 MG tablet, Take 1 tablet by mouth twice daily, Disp: 60 tablet, Rfl: 6 .  cholecalciferol (VITAMIN D) 25 MCG (1000 UNIT) tablet, Take 3,000 Units by mouth daily., Disp: , Rfl:  .  colchicine 0.6 MG tablet, Take 0.5 tablets (0.3 mg total) by mouth daily., Disp: 15 tablet, Rfl: 0 .  diclofenac Sodium (VOLTAREN) 1 % GEL, Apply 2 g topically 4 (four) times daily., Disp: , Rfl:  .  ELIQUIS 2.5 MG TABS tablet, Take 1 tablet by mouth twice daily, Disp: 60 tablet, Rfl: 6 .  gabapentin (NEURONTIN) 100 MG capsule, Take 200 mg by mouth at bedtime., Disp: , Rfl:  .  losartan (COZAAR) 25  MG tablet,  Take 1 tablet (25 mg total) by mouth daily., Disp: 90 tablet, Rfl: 0 .  methocarbamol (ROBAXIN) 500 MG tablet, Take 1 tablet (500 mg total) by mouth every 6 (six) hours as needed for muscle spasms., Disp: 30 tablet, Rfl: 0 .  metoprolol succinate (TOPROL-XL) 50 MG 24 hr tablet, Take 50 mg by mouth daily., Disp: , Rfl:  .  Misc. Devices MISC, Please dispense one power stair lift for at home daily use prn debility., Disp: 1 Device, Rfl: 0 .  nitroGLYCERIN (NITROSTAT) 0.4 MG SL tablet, Place 1 tablet (0.4 mg total) under the tongue every 5 (five) minutes as needed for chest pain., Disp: 30 tablet, Rfl: 0 .  pantoprazole (PROTONIX) 40 MG tablet, Take 1 tablet (40 mg total) by mouth daily., Disp: 30 tablet, Rfl: 0 .  rosuvastatin (CRESTOR) 20 MG tablet, Take 1 tablet (20 mg total) by mouth at bedtime., Disp: 90 tablet, Rfl: 1 .  traZODone (DESYREL) 50 MG tablet, Take 1 tablet by mouth at bedtime., Disp: , Rfl:   Orders Placed This Encounter  Procedures  . Basic metabolic panel  . Magnesium  . Pro b natriuretic peptide (BNP)  . EKG 12-Lead   --Continue cardiac medications as reconciled in final medication list. --Return in about 3 months (around 07/06/2021) for Follow up, CAD, A. fib, recovered CMP. Or sooner if needed. --Continue follow-up with your primary care physician regarding the management of your other chronic comorbid conditions.  Patient's questions and concerns were addressed to her satisfaction. She voices understanding of the instructions provided during this encounter.   This note was created using a voice recognition software as a result there may be grammatical errors inadvertently enclosed that do not reflect the nature of this encounter. Every attempt is made to correct such errors.  Kristin [name], [place], [facility]  Pager: [phone] Office: [phone]

## 2021-04-06 ENCOUNTER — Ambulatory Visit: Payer: Medicare HMO | Admitting: Cardiology

## 2021-04-06 ENCOUNTER — Encounter: Payer: Self-pay | Admitting: Cardiology

## 2021-04-06 ENCOUNTER — Other Ambulatory Visit: Payer: Self-pay

## 2021-04-06 VITALS — BP 115/72 | HR 78 | Temp 98.0°F | Resp 17 | Ht 64.0 in | Wt 208.0 lb

## 2021-04-06 DIAGNOSIS — I480 Paroxysmal atrial fibrillation: Secondary | ICD-10-CM

## 2021-04-06 DIAGNOSIS — E1122 Type 2 diabetes mellitus with diabetic chronic kidney disease: Secondary | ICD-10-CM | POA: Diagnosis not present

## 2021-04-06 DIAGNOSIS — I252 Old myocardial infarction: Secondary | ICD-10-CM

## 2021-04-06 DIAGNOSIS — E782 Mixed hyperlipidemia: Secondary | ICD-10-CM

## 2021-04-06 DIAGNOSIS — I255 Ischemic cardiomyopathy: Secondary | ICD-10-CM | POA: Diagnosis not present

## 2021-04-06 DIAGNOSIS — R059 Cough, unspecified: Secondary | ICD-10-CM | POA: Diagnosis not present

## 2021-04-06 DIAGNOSIS — Z7901 Long term (current) use of anticoagulants: Secondary | ICD-10-CM | POA: Diagnosis not present

## 2021-04-06 DIAGNOSIS — I5032 Chronic diastolic (congestive) heart failure: Secondary | ICD-10-CM | POA: Diagnosis not present

## 2021-04-06 DIAGNOSIS — I2510 Atherosclerotic heart disease of native coronary artery without angina pectoris: Secondary | ICD-10-CM

## 2021-04-06 DIAGNOSIS — Z8673 Personal history of transient ischemic attack (TIA), and cerebral infarction without residual deficits: Secondary | ICD-10-CM | POA: Diagnosis not present

## 2021-04-06 MED ORDER — LOSARTAN POTASSIUM 25 MG PO TABS
25.0000 mg | ORAL_TABLET | Freq: Every day | ORAL | 0 refills | Status: DC
Start: 2021-04-06 — End: 2021-05-16

## 2021-04-06 MED ORDER — BENZONATATE 100 MG PO CAPS: 100.0000 mg | ORAL_CAPSULE | Freq: Two times a day (BID) | ORAL | 0 refills | Status: AC | PRN

## 2021-04-13 ENCOUNTER — Other Ambulatory Visit: Payer: Self-pay

## 2021-04-13 ENCOUNTER — Other Ambulatory Visit: Payer: Self-pay | Admitting: Cardiology

## 2021-04-13 DIAGNOSIS — H538 Other visual disturbances: Secondary | ICD-10-CM | POA: Diagnosis not present

## 2021-04-13 DIAGNOSIS — I252 Old myocardial infarction: Secondary | ICD-10-CM | POA: Diagnosis not present

## 2021-04-13 DIAGNOSIS — I255 Ischemic cardiomyopathy: Secondary | ICD-10-CM | POA: Diagnosis not present

## 2021-04-13 DIAGNOSIS — E119 Type 2 diabetes mellitus without complications: Secondary | ICD-10-CM | POA: Diagnosis not present

## 2021-04-13 DIAGNOSIS — H35033 Hypertensive retinopathy, bilateral: Secondary | ICD-10-CM | POA: Diagnosis not present

## 2021-04-13 DIAGNOSIS — H40013 Open angle with borderline findings, low risk, bilateral: Secondary | ICD-10-CM | POA: Diagnosis not present

## 2021-04-13 DIAGNOSIS — I5032 Chronic diastolic (congestive) heart failure: Secondary | ICD-10-CM | POA: Diagnosis not present

## 2021-04-13 MED ORDER — ISOSORBIDE DINITRATE 30 MG PO TABS: 45.0000 mg | ORAL_TABLET | Freq: Three times a day (TID) | ORAL | 3 refills | Status: DC

## 2021-04-13 MED ORDER — HYDRALAZINE HCL 50 MG PO TABS: 75.0000 mg | ORAL_TABLET | Freq: Three times a day (TID) | ORAL | 3 refills | Status: DC

## 2021-04-13 NOTE — Telephone Encounter (Signed)
Patients daughter called and said the patient is out of her Bidil because  the pharmacy does not have it.Blenda Bridegroom helped me break it up into 2 meds and I sent it in.

## 2021-04-14 LAB — BASIC METABOLIC PANEL
BUN/Creatinine Ratio: 17 (ref 12–28)
BUN: 33 mg/dL — ABNORMAL HIGH (ref 8–27)
CO2: 29 mmol/L (ref 20–29)
Calcium: 10.2 mg/dL (ref 8.7–10.3)
Chloride: 101 mmol/L (ref 96–106)
Creatinine, Ser: 1.94 mg/dL — ABNORMAL HIGH (ref 0.57–1.00)
Glucose: 98 mg/dL (ref 65–99)
Potassium: 4.8 mmol/L (ref 3.5–5.2)
Sodium: 146 mmol/L — ABNORMAL HIGH (ref 134–144)
eGFR: 26 mL/min/{1.73_m2} — ABNORMAL LOW (ref >59)

## 2021-04-14 LAB — MAGNESIUM: Magnesium: 2.3 mg/dL (ref 1.6–2.3)

## 2021-04-14 LAB — PRO B NATRIURETIC PEPTIDE: NT-Pro BNP: 1769 pg/mL — ABNORMAL HIGH (ref 0–738)

## 2021-04-16 ENCOUNTER — Ambulatory Visit (INDEPENDENT_AMBULATORY_CARE_PROVIDER_SITE_OTHER): Payer: Medicare HMO | Admitting: Orthopaedic Surgery

## 2021-04-16 ENCOUNTER — Other Ambulatory Visit: Payer: Self-pay | Admitting: Cardiology

## 2021-04-16 ENCOUNTER — Encounter: Payer: Self-pay | Admitting: Orthopaedic Surgery

## 2021-04-16 VITALS — Ht 64.0 in | Wt 208.0 lb

## 2021-04-16 DIAGNOSIS — G8929 Other chronic pain: Secondary | ICD-10-CM

## 2021-04-16 DIAGNOSIS — M25562 Pain in left knee: Secondary | ICD-10-CM

## 2021-04-16 DIAGNOSIS — M1711 Unilateral primary osteoarthritis, right knee: Secondary | ICD-10-CM

## 2021-04-16 DIAGNOSIS — M1712 Unilateral primary osteoarthritis, left knee: Secondary | ICD-10-CM | POA: Insufficient documentation

## 2021-04-16 DIAGNOSIS — I5032 Chronic diastolic (congestive) heart failure: Secondary | ICD-10-CM

## 2021-04-16 DIAGNOSIS — M25561 Pain in right knee: Secondary | ICD-10-CM

## 2021-04-16 MED ORDER — METHYLPREDNISOLONE ACETATE 40 MG/ML IJ SUSP
40.0000 mg | INTRAMUSCULAR | Status: AC | PRN
  Administered 2021-04-16: 40 mg via INTRA_ARTICULAR

## 2021-04-16 MED ORDER — METOLAZONE 2.5 MG PO TABS: 2.5000 mg | ORAL_TABLET | ORAL | 0 refills | Status: DC

## 2021-04-16 MED ORDER — LIDOCAINE HCL 1 % IJ SOLN
3.0000 mL | INTRAMUSCULAR | Status: AC | PRN
  Administered 2021-04-16: 3 mL

## 2021-04-16 NOTE — Progress Notes (Signed)
Office Visit Note   Patient: Kristin [name]           Date of Birth: [birth year]           MRN: [id number] Visit Date: 04/16/2021              Requested by: Caren Macadam, MD [place],  [place] [address] PCP: Caren Macadam, MD   Assessment & Plan: Visit Diagnoses:  1. Chronic pain of right knee   2. Chronic pain of left knee   3. Unilateral primary osteoarthritis, right knee   4. Unilateral primary osteoarthritis, left knee     Plan: Per the patient's request I did provide a steroid injection to both knees today and counseled her about the risk and benefits of these injections.  Since it has been over 6 months since her last hyaluronic acid injections, that will be the next step for her since they felt well in the past.  If they decide to have these ordered they will let us know.  Follow-up as otherwise as needed.  All questions and concerns were answered and addressed.  Follow-Up Instructions: Return if symptoms worsen or fail to improve.   Orders:  Orders Placed This Encounter  Procedures  . Large Joint Inj  . Large Joint Inj   No orders of the defined types were placed in this encounter.     Procedures: Large Joint Inj: R knee on 04/16/2021 3:26 PM Indications: diagnostic evaluation and pain Details: 22 G 1.5 in needle, superolateral approach  Arthrogram: No  Medications: 3 mL lidocaine 1 %; 40 mg methylPREDNISolone acetate 40 MG/ML Outcome: tolerated well, no immediate complications Procedure, treatment alternatives, risks and benefits explained, specific risks discussed. Consent was given by the patient. Immediately prior to procedure a time out was called to verify the correct patient, procedure, equipment, support staff and site/side marked as required. Patient was prepped and draped in the usual sterile fashion.   Large Joint Inj: L knee on 04/16/2021 3:26 PM Indications: diagnostic evaluation and pain Details: 22 G 1.5 in needle, superolateral  approach  Arthrogram: No  Medications: 3 mL lidocaine 1 %; 40 mg methylPREDNISolone acetate 40 MG/ML Outcome: tolerated well, no immediate complications Procedure, treatment alternatives, risks and benefits explained, specific risks discussed. Consent was given by the patient. Immediately prior to procedure a time out was called to verify the correct patient, procedure, equipment, support staff and site/side marked as required. Patient was prepped and draped in the usual sterile fashion.       Clinical Data: No additional findings.   Subjective: Chief Complaint  Patient presents with  . Left Knee - Follow-up  . Right Knee - Follow-up  The patient is an 80 year old female well-known to me.  She has severe debilitating arthritis of both knees.  It has been about 6 to 7 months since she had injection in her right knee which was hyaluronic acid.  Both knees are hurting her and x-rays have shown end-stage arthritis of both knees.  She is requesting early steroid injections today.  She cannot take anti-inflammatories because she is on Eliquis.  She is not a surgical candidate given significant cardiac issues.  She has recently had no other acute change in medical status other than her knees hurting with the right worse than left.  HPI  Review of Systems There is currently listed no headache, chest pain, shortness of breath, fever, chills, nausea, vomiting  Objective: Vital Signs: Ht '5\' 4"'$  (1.626 m)  Wt 208 lb (94.3 kg)   BMI 35.70 kg/m   Physical Exam She is alert and oriented and in no acute distress Ortho Exam Neither knee has an effusion today but both knees have varus malalignment and significant medial joint line tenderness as well as patellofemoral crepitation and pain from flexion to extension. Specialty Comments:  No specialty comments available.  Imaging: No results found.   PMFS History: Patient Active Problem List   Diagnosis Date Noted  . Unilateral primary  osteoarthritis, left knee 04/16/2021  . Unilateral primary osteoarthritis, right knee 10/03/2020  . Cerebral embolism with cerebral infarction 08/28/2020  . 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 AKI (acute kidney injury) ([place])   . Atherosclerosis of native coronary artery of native heart without angina pectoris   . NSTEMI (non-ST elevated myocardial infarction) ([place])   . Atrial fibrillation ([place])   . Mixed hyperlipidemia   . Elevated troponin   . Pulmonary edema   . Acute HFrEF (heart failure with reduced ejection fraction) ([place])   . Cardiomyopathy ([place])   . Effusion of right knee joint   . SIRS (systemic inflammatory response syndrome) ([place]) 08/18/2020  . Lower extremity weakness 08/18/2020  . Hypertension   . Chronic low back pain with bilateral sciatica    Past Medical History:  Diagnosis Date  . Atrial fibrillation ([place])   . CHF (congestive heart failure) ([place])   . Coronary artery disease   . Diabetes mellitus without complication ([place])   . Hypercholesteremia   . Hypertension   . Renal disorder    CKD Stage IV  . Stroke [facility])     Family History  Problem Relation Age of Onset  . Diabetes Father   . Heart attack Father   . Heart disease Mother   . Hypertension Sister   . Hypertension Sister   . Hypertension Sister   . Hypertension Sister   . Hypertension Sister   . Hypertension Sister   . Hypertension Sister   . Hypertension Sister   . Hypertension Sister   . Hypertension Sister   . Hypertension Sister   . Hypertension Sister     Past Surgical History:  Procedure Laterality Date  . LUMBAR LAMINECTOMY/DECOMPRESSION MICRODISCECTOMY N/A 08/19/2020   Procedure: LUMBAR LAMINECTOMY/DECOMPRESSION  Lumbar two-three, Lumbar three-four;  Surgeon: Dawley, Theodoro Doing, DO;  Location: [facility];  Service: Neurosurgery;  Laterality: N/A;  . RIGHT/LEFT HEART CATH AND CORONARY ANGIOGRAPHY N/A 08/23/2020   Procedure: RIGHT/LEFT HEART CATH AND  CORONARY ANGIOGRAPHY;  Surgeon: [name], MD;  Location: [facility];  Service: Cardiovascular;  Laterality: N/A;   Social History   Occupational History  . Not on file  Tobacco Use  . Smoking status: Never Smoker  . Smokeless tobacco: Never Used  Vaping Use  . Vaping Use: Never used  Substance and Sexual Activity  . Alcohol use: Never  . Drug use: Never  . Sexual activity: Not on file

## 2021-04-30 DIAGNOSIS — I5032 Chronic diastolic (congestive) heart failure: Secondary | ICD-10-CM | POA: Diagnosis not present

## 2021-04-30 DIAGNOSIS — N184 Chronic kidney disease, stage 4 (severe): Secondary | ICD-10-CM | POA: Diagnosis not present

## 2021-05-01 ENCOUNTER — Other Ambulatory Visit: Payer: Self-pay | Admitting: Cardiology

## 2021-05-01 DIAGNOSIS — N179 Acute kidney failure, unspecified: Secondary | ICD-10-CM

## 2021-05-01 LAB — BASIC METABOLIC PANEL
BUN/Creatinine Ratio: 28 (ref 12–28)
BUN: 89 mg/dL (ref 8–27)
CO2: 31 mmol/L — ABNORMAL HIGH (ref 20–29)
Calcium: 10 mg/dL (ref 8.7–10.3)
Chloride: 95 mmol/L — ABNORMAL LOW (ref 96–106)
Creatinine, Ser: 3.19 mg/dL — ABNORMAL HIGH (ref 0.57–1.00)
Glucose: 121 mg/dL — ABNORMAL HIGH (ref 65–99)
Potassium: 4 mmol/L (ref 3.5–5.2)
Sodium: 144 mmol/L (ref 134–144)
eGFR: 14 mL/min/{1.73_m2} — ABNORMAL LOW (ref >59)

## 2021-05-01 LAB — PRO B NATRIURETIC PEPTIDE: NT-Pro BNP: 536 pg/mL (ref 0–738)

## 2021-05-01 LAB — MAGNESIUM: Magnesium: 2.5 mg/dL — ABNORMAL HIGH (ref 1.6–2.3)

## 2021-05-01 NOTE — Progress Notes (Signed)
Spoke to the daughter.  Finished Zaroxolyn on Friday 5/20.  Home BP have been soft.  Asked her to HOLD Bumex and Losartan for now.  Only to give hydralazine '50mg'$  po tid and isordil '30mg'$  po tid if SBP >18mHg.  Only to give toprol xl '50mg'$  po qday if SBP>1223mg and HR > 60bpm.  Labs on 5/26 Informed her with indications when to take mom to ER.  She is asked to call her nephrologist as well.

## 2021-05-03 DIAGNOSIS — M48061 Spinal stenosis, lumbar region without neurogenic claudication: Secondary | ICD-10-CM | POA: Diagnosis not present

## 2021-05-03 DIAGNOSIS — N179 Acute kidney failure, unspecified: Secondary | ICD-10-CM | POA: Diagnosis not present

## 2021-05-03 DIAGNOSIS — I5022 Chronic systolic (congestive) heart failure: Secondary | ICD-10-CM | POA: Diagnosis not present

## 2021-05-03 DIAGNOSIS — R5381 Other malaise: Secondary | ICD-10-CM | POA: Diagnosis not present

## 2021-05-04 LAB — BASIC METABOLIC PANEL
BUN/Creatinine Ratio: 33 — ABNORMAL HIGH (ref 12–28)
BUN: 72 mg/dL — ABNORMAL HIGH (ref 8–27)
CO2: 32 mmol/L — ABNORMAL HIGH (ref 20–29)
Calcium: 10.2 mg/dL (ref 8.7–10.3)
Chloride: 97 mmol/L (ref 96–106)
Creatinine, Ser: 2.19 mg/dL — ABNORMAL HIGH (ref 0.57–1.00)
Glucose: 112 mg/dL — ABNORMAL HIGH (ref 65–99)
Potassium: 4.3 mmol/L (ref 3.5–5.2)
Sodium: 143 mmol/L (ref 134–144)
eGFR: 22 mL/min/{1.73_m2} — ABNORMAL LOW (ref >59)

## 2021-05-04 LAB — MAGNESIUM: Magnesium: 2.7 mg/dL — ABNORMAL HIGH (ref 1.6–2.3)

## 2021-05-08 ENCOUNTER — Other Ambulatory Visit: Payer: Self-pay | Admitting: Internal Medicine

## 2021-05-10 ENCOUNTER — Other Ambulatory Visit: Payer: Self-pay

## 2021-05-10 ENCOUNTER — Emergency Department (HOSPITAL_COMMUNITY): Payer: Medicare HMO

## 2021-05-10 ENCOUNTER — Telehealth: Payer: Self-pay

## 2021-05-10 ENCOUNTER — Encounter (HOSPITAL_COMMUNITY): Payer: Self-pay

## 2021-05-10 ENCOUNTER — Inpatient Hospital Stay (HOSPITAL_COMMUNITY)
Admission: EM | Admit: 2021-05-10 | Discharge: 2021-05-16 | DRG: 246 | Disposition: A | Discharge status: Home / Self Care | Payer: Medicare HMO | Attending: Internal Medicine | Admitting: Internal Medicine

## 2021-05-10 DIAGNOSIS — Z20822 Contact with and (suspected) exposure to covid-19: Secondary | ICD-10-CM | POA: Diagnosis present

## 2021-05-10 DIAGNOSIS — E782 Mixed hyperlipidemia: Secondary | ICD-10-CM | POA: Diagnosis present

## 2021-05-10 DIAGNOSIS — R079 Chest pain, unspecified: Secondary | ICD-10-CM | POA: Diagnosis not present

## 2021-05-10 DIAGNOSIS — I700 Atherosclerosis of aorta: Secondary | ICD-10-CM | POA: Diagnosis present

## 2021-05-10 DIAGNOSIS — Z955 Presence of coronary angioplasty implant and graft: Secondary | ICD-10-CM

## 2021-05-10 DIAGNOSIS — Z8249 Family history of ischemic heart disease and other diseases of the circulatory system: Secondary | ICD-10-CM

## 2021-05-10 DIAGNOSIS — I252 Old myocardial infarction: Secondary | ICD-10-CM

## 2021-05-10 DIAGNOSIS — Z743 Need for continuous supervision: Secondary | ICD-10-CM | POA: Diagnosis not present

## 2021-05-10 DIAGNOSIS — R439 Unspecified disturbances of smell and taste: Secondary | ICD-10-CM | POA: Diagnosis present

## 2021-05-10 DIAGNOSIS — Z7982 Long term (current) use of aspirin: Secondary | ICD-10-CM

## 2021-05-10 DIAGNOSIS — R778 Other specified abnormalities of plasma proteins: Secondary | ICD-10-CM | POA: Diagnosis present

## 2021-05-10 DIAGNOSIS — I2510 Atherosclerotic heart disease of native coronary artery without angina pectoris: Secondary | ICD-10-CM | POA: Diagnosis not present

## 2021-05-10 DIAGNOSIS — Z79899 Other long term (current) drug therapy: Secondary | ICD-10-CM

## 2021-05-10 DIAGNOSIS — I480 Paroxysmal atrial fibrillation: Secondary | ICD-10-CM | POA: Diagnosis present

## 2021-05-10 DIAGNOSIS — D631 Anemia in chronic kidney disease: Secondary | ICD-10-CM | POA: Diagnosis present

## 2021-05-10 DIAGNOSIS — N1832 Chronic kidney disease, stage 3b: Secondary | ICD-10-CM | POA: Diagnosis present

## 2021-05-10 DIAGNOSIS — I69398 Other sequelae of cerebral infarction: Secondary | ICD-10-CM

## 2021-05-10 DIAGNOSIS — I129 Hypertensive chronic kidney disease with stage 1 through stage 4 chronic kidney disease, or unspecified chronic kidney disease: Secondary | ICD-10-CM | POA: Diagnosis not present

## 2021-05-10 DIAGNOSIS — I214 Non-ST elevation (NSTEMI) myocardial infarction: Secondary | ICD-10-CM | POA: Diagnosis not present

## 2021-05-10 DIAGNOSIS — I5020 Unspecified systolic (congestive) heart failure: Secondary | ICD-10-CM | POA: Diagnosis present

## 2021-05-10 DIAGNOSIS — R000 Tachycardia, unspecified: Secondary | ICD-10-CM | POA: Diagnosis not present

## 2021-05-10 DIAGNOSIS — N184 Chronic kidney disease, stage 4 (severe): Secondary | ICD-10-CM | POA: Diagnosis present

## 2021-05-10 DIAGNOSIS — R200 Anesthesia of skin: Secondary | ICD-10-CM | POA: Diagnosis present

## 2021-05-10 DIAGNOSIS — I5033 Acute on chronic diastolic (congestive) heart failure: Secondary | ICD-10-CM | POA: Diagnosis present

## 2021-05-10 DIAGNOSIS — I517 Cardiomegaly: Secondary | ICD-10-CM | POA: Diagnosis not present

## 2021-05-10 DIAGNOSIS — R0789 Other chest pain: Secondary | ICD-10-CM | POA: Diagnosis not present

## 2021-05-10 DIAGNOSIS — E1122 Type 2 diabetes mellitus with diabetic chronic kidney disease: Secondary | ICD-10-CM | POA: Diagnosis present

## 2021-05-10 DIAGNOSIS — Z7901 Long term (current) use of anticoagulants: Secondary | ICD-10-CM

## 2021-05-10 DIAGNOSIS — I130 Hypertensive heart and chronic kidney disease with heart failure and stage 1 through stage 4 chronic kidney disease, or unspecified chronic kidney disease: Secondary | ICD-10-CM | POA: Diagnosis present

## 2021-05-10 DIAGNOSIS — J984 Other disorders of lung: Secondary | ICD-10-CM | POA: Diagnosis not present

## 2021-05-10 DIAGNOSIS — I255 Ischemic cardiomyopathy: Secondary | ICD-10-CM | POA: Diagnosis present

## 2021-05-10 DIAGNOSIS — I10 Essential (primary) hypertension: Secondary | ICD-10-CM | POA: Diagnosis present

## 2021-05-10 DIAGNOSIS — J811 Chronic pulmonary edema: Secondary | ICD-10-CM | POA: Diagnosis not present

## 2021-05-10 DIAGNOSIS — Z833 Family history of diabetes mellitus: Secondary | ICD-10-CM

## 2021-05-10 DIAGNOSIS — M549 Dorsalgia, unspecified: Secondary | ICD-10-CM | POA: Diagnosis not present

## 2021-05-10 DIAGNOSIS — I959 Hypotension, unspecified: Secondary | ICD-10-CM | POA: Diagnosis not present

## 2021-05-10 DIAGNOSIS — D509 Iron deficiency anemia, unspecified: Secondary | ICD-10-CM | POA: Diagnosis present

## 2021-05-10 LAB — BASIC METABOLIC PANEL
Anion gap: 8 (ref 5–15)
BUN: 23 mg/dL (ref 8–23)
CO2: 27 mmol/L (ref 22–32)
Calcium: 9 mg/dL (ref 8.9–10.3)
Chloride: 107 mmol/L (ref 98–111)
Creatinine, Ser: 1.98 mg/dL — ABNORMAL HIGH (ref 0.44–1.00)
GFR, Estimated: 25 mL/min — ABNORMAL LOW (ref >60)
Glucose, Bld: 214 mg/dL — ABNORMAL HIGH (ref 70–99)
Potassium: 3.9 mmol/L (ref 3.5–5.1)
Sodium: 142 mmol/L (ref 135–145)

## 2021-05-10 LAB — CBC
HCT: 34 % — ABNORMAL LOW (ref 36.0–46.0)
Hemoglobin: 10.5 g/dL — ABNORMAL LOW (ref 12.0–15.0)
MCH: 30.3 pg (ref 26.0–34.0)
MCHC: 30.9 g/dL (ref 30.0–36.0)
MCV: 98 fL (ref 80.0–100.0)
Platelets: 166 10*3/uL (ref 150–400)
RBC: 3.47 MIL/uL — ABNORMAL LOW (ref 3.87–5.11)
RDW: 13.2 % (ref 11.5–15.5)
WBC: 8 10*3/uL (ref 4.0–10.5)
nRBC: 0 % (ref 0.0–0.2)

## 2021-05-10 LAB — TROPONIN I (HIGH SENSITIVITY)
Troponin I (High Sensitivity): 75 ng/L — ABNORMAL HIGH (ref <18)
Troponin I (High Sensitivity): 88 ng/L — ABNORMAL HIGH (ref <18)

## 2021-05-10 MED ORDER — ONDANSETRON HCL 4 MG/2ML IJ SOLN
4.0000 mg | Freq: Once | INTRAMUSCULAR | Status: AC
Start: 2021-05-10 — End: 2021-05-10
  Administered 2021-05-10: 4 mg via INTRAVENOUS
  Filled 2021-05-10: qty 2

## 2021-05-10 MED ORDER — MORPHINE SULFATE (PF) 4 MG/ML IV SOLN
4.0000 mg | Freq: Once | INTRAVENOUS | Status: AC
Start: 2021-05-10 — End: 2021-05-10
  Administered 2021-05-10: 4 mg via INTRAVENOUS
  Filled 2021-05-10: qty 1

## 2021-05-10 MED ORDER — HYDROMORPHONE HCL 1 MG/ML IJ SOLN
1.0000 mg | Freq: Once | INTRAMUSCULAR | Status: AC
Start: 2021-05-10 — End: 2021-05-10
  Administered 2021-05-10: 1 mg via INTRAVENOUS
  Filled 2021-05-10: qty 1

## 2021-05-10 NOTE — Telephone Encounter (Signed)
They have a nephrologist already.   Neurology would be beneficial due to her hx of stroke and continued dizziness. Will defer referral to primary team.

## 2021-05-10 NOTE — ED Triage Notes (Signed)
Pt BIB [facility] d/t sudden onset CP that radiates to her back. Pt took 1 nitro when it happened & with no relief she used her life alert & when Fire arrived she was given 2 more Nitro & daughter gave her 324 ASA on scene. Upon arrival to ED pt reports some chest tightness & slight headache, BP 180/100 EMS reports 12 Lead with frequent PVC's and a Hx of heart attack, A/Ox4, verbal & able to make needs known.

## 2021-05-10 NOTE — ED Provider Notes (Signed)
[facility] EMERGENCY DEPARTMENT Provider Note   CSN: [id number] Arrival date & time: 05/10/21  1900     History Chief Complaint  Patient presents with  . Chest Pain    Kristin [name] is a 80 y.o. female.  Pt presents to the ED today with chest pain.  Pt said she has had cp since this evening  Pt has a hx cad and had a heart attack in September.  Pt said she has pain in her chest and her back and her neck.  It hurts when she moves and takes a deep breath.  She took 3 nitro at home without relief of sx.  She does not think this feels similar.        Past Medical History:  Diagnosis Date  . Atrial fibrillation ([place])   . CHF (congestive heart failure) ([place])   . Coronary artery disease   . Diabetes mellitus without complication ([place])   . Hypercholesteremia   . Hypertension   . Renal disorder    CKD Stage IV  . Stroke [facility])     Patient Active Problem List   Diagnosis Date Noted  . Chest pain 05/10/2021  . Unilateral primary osteoarthritis, left knee 04/16/2021  . Unilateral primary osteoarthritis, right knee 10/03/2020  . Cerebral embolism with cerebral infarction 08/28/2020  . 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 AKI (acute kidney injury) ([place])   . Atherosclerosis of native coronary artery of native heart without angina pectoris   . NSTEMI (non-ST elevated myocardial infarction) ([place])   . Atrial fibrillation ([place])   . Mixed hyperlipidemia   . Elevated troponin   . Pulmonary edema   . Acute HFrEF (heart failure with reduced ejection fraction) ([place])   . Cardiomyopathy ([place])   . Effusion of right knee joint   . SIRS (systemic inflammatory response syndrome) ([place]) 08/18/2020  . Lower extremity weakness 08/18/2020  . Hypertension   . Chronic low back pain with bilateral sciatica     Past Surgical History:  Procedure Laterality Date  . LUMBAR LAMINECTOMY/DECOMPRESSION MICRODISCECTOMY N/A 08/19/2020    Procedure: LUMBAR LAMINECTOMY/DECOMPRESSION  Lumbar two-three, Lumbar three-four;  Surgeon: Dawley, Theodoro Doing, DO;  Location: [facility];  Service: Neurosurgery;  Laterality: N/A;  . RIGHT/LEFT HEART CATH AND CORONARY ANGIOGRAPHY N/A 08/23/2020   Procedure: RIGHT/LEFT HEART CATH AND CORONARY ANGIOGRAPHY;  Surgeon: [name], MD;  Location: [facility];  Service: Cardiovascular;  Laterality: N/A;     OB History   No obstetric history on file.     Family History  Problem Relation Age of Onset  . Diabetes Father   . Heart attack Father   . Heart disease Mother   . Hypertension Sister   . Hypertension Sister   . Hypertension Sister   . Hypertension Sister   . Hypertension Sister   . Hypertension Sister   . Hypertension Sister   . Hypertension Sister   . Hypertension Sister   . Hypertension Sister   . Hypertension Sister   . Hypertension Sister     Social History   Tobacco Use  . Smoking status: Never Smoker  . Smokeless tobacco: Never Used  Vaping Use  . Vaping Use: Never used  Substance Use Topics  . Alcohol use: Never  . Drug use: Never    Home Medications Prior to Admission medications   Medication Sig Start Date End Date Taking? Authorizing Provider  aspirin EC 81 MG  tablet Take 1 tablet (81 mg total) by mouth daily. Swallow whole. 10/27/20   Tolia, Sunit, DO  bumetanide (BUMEX) 1 MG tablet Take 1 tablet by mouth twice daily 11/23/20   [name], MD  cholecalciferol (VITAMIN D) 25 MCG (1000 UNIT) tablet Take 3,000 Units by mouth daily.    [provider]  colchicine 0.6 MG tablet Take 0.5 tablets (0.3 mg total) by mouth daily. 08/31/20   Aline August, MD  diclofenac Sodium (VOLTAREN) 1 % GEL Apply 2 g topically 4 (four) times daily. 08/30/20   Aline August, MD  ELIQUIS 2.5 MG TABS tablet Take 1 tablet by mouth twice daily 11/23/20   [name], MD  gabapentin (NEURONTIN) 100 MG capsule Take 200 mg by mouth at bedtime. 09/22/20   [provider]   hydrALAZINE (APRESOLINE) 50 MG tablet Take 1.5 tablets (75 mg total) by mouth 3 (three) times daily. 04/13/21   Tolia, Sunit, DO  isosorbide dinitrate (ISORDIL) 30 MG tablet Take 1.5 tablets (45 mg total) by mouth 3 (three) times daily. 04/13/21   Tolia, Sunit, DO  losartan (COZAAR) 25 MG tablet Take 1 tablet (25 mg total) by mouth daily. 04/06/21 07/05/21  Tolia, Sunit, DO  methocarbamol (ROBAXIN) 500 MG tablet Take 1 tablet (500 mg total) by mouth every 6 (six) hours as needed for muscle spasms. 08/30/20   Aline August, MD  metoprolol succinate (TOPROL-XL) 50 MG 24 hr tablet Take 50 mg by mouth daily. 09/22/20   [provider]  Misc. Devices MISC Please dispense one power stair lift for at home daily use prn debility. 03/15/20   Nicolette Bang, DO  nitroGLYCERIN (NITROSTAT) 0.4 MG SL tablet Place 1 tablet (0.4 mg total) under the tongue every 5 (five) minutes as needed for chest pain. 09/29/20   Tolia, Sunit, DO  pantoprazole (PROTONIX) 40 MG tablet Take 1 tablet (40 mg total) by mouth daily. 08/30/20   Aline August, MD  rosuvastatin (CRESTOR) 20 MG tablet Take 1 tablet (20 mg total) by mouth at bedtime. 03/16/20   Nicolette Bang, DO  traZODone (DESYREL) 50 MG tablet Take 1 tablet by mouth at bedtime. 03/16/21   [provider]    Allergies    Patient has no known allergies.  Review of Systems   Review of Systems  Cardiovascular: Positive for chest pain.  Musculoskeletal: Positive for back pain and neck pain.  All other systems reviewed and are negative.   Physical Exam Updated Vital Signs BP 136/85   Pulse (!) 103   Temp 98.4 F (36.9 C) (Oral)   Resp (!) 21   Ht '5\' 4"'$  (1.626 m)   Wt 92.5 kg   SpO2 93%   BMI 35.02 kg/m   Physical Exam Vitals and nursing note reviewed.  Constitutional:      Appearance: She is well-developed.  HENT:     Head: Normocephalic and atraumatic.  Eyes:     Extraocular Movements: Extraocular movements intact.      Pupils: Pupils are equal, round, and reactive to light.  Cardiovascular:     Rate and Rhythm: Normal rate and regular rhythm.     Heart sounds: Normal heart sounds.  Pulmonary:     Effort: Pulmonary effort is normal.     Breath sounds: Normal breath sounds.  Chest:     Comments: Tenderness to palpation all over chest and back Abdominal:     General: Bowel sounds are normal.     Palpations: Abdomen is soft.  Musculoskeletal:        General: Normal range of motion.     Cervical back: Normal range of motion and neck supple.  Skin:    General: Skin is warm.     Capillary Refill: Capillary refill takes less than 2 seconds.  Neurological:     General: No focal deficit present.     Mental Status: She is alert and oriented to person, place, and time.  Psychiatric:        Mood and Affect: Mood normal.        Behavior: Behavior normal.     ED Results / Procedures / Treatments   Labs (all labs ordered are listed, but only abnormal results are displayed) Labs Reviewed  BASIC METABOLIC PANEL - Abnormal; Notable for the following components:      Result Value   Glucose, Bld 214 (*)    Creatinine, Ser 1.98 (*)    GFR, Estimated 25 (*)    All other components within normal limits  CBC - Abnormal; Notable for the following components:   RBC 3.47 (*)    Hemoglobin 10.5 (*)    HCT 34.0 (*)    All other components within normal limits  TROPONIN I (HIGH SENSITIVITY) - Abnormal; Notable for the following components:   Troponin I (High Sensitivity) 75 (*)    All other components within normal limits  TROPONIN I (HIGH SENSITIVITY) - Abnormal; Notable for the following components:   Troponin I (High Sensitivity) 88 (*)    All other components within normal limits  RESP PANEL BY RT-PCR (FLU A&B, COVID) [id number]  URINALYSIS, ROUTINE W REFLEX MICROSCOPIC  D-DIMER, QUANTITATIVE    EKG EKG Interpretation  Date/Time:  Thursday May 10 2021 19:19:07 EDT Ventricular Rate:  102 PR  Interval:  186 QRS Duration: 84 QT Interval:  332 QTC Calculation: 433 R Axis:   8 Text Interpretation: Sinus tachycardia Atrial premature complex Since last tracing rate faster + PVCs Confirmed by Isla Pence [phone]) on 05/10/2021 7:53:28 PM   Radiology CT Chest Wo Contrast  Result Date: 05/10/2021 CLINICAL DATA:  Chest pain, back pain EXAM: CT CHEST WITHOUT CONTRAST TECHNIQUE: Multidetector CT imaging of the chest was performed following the standard protocol without IV contrast. COMPARISON:  None. FINDINGS: Cardiovascular: Heart is normal size. Moderate coronary artery and aortic calcifications. No evidence of aortic aneurysm. Mediastinum/Nodes: No mediastinal, hilar, or axillary adenopathy. Trachea and esophagus are unremarkable. Thyroid unremarkable. Lungs/Pleura: Linear areas of scarring in the lungs bilaterally. No confluent opacities or effusions. Upper Abdomen: Imaging into the upper abdomen demonstrates no acute findings. Musculoskeletal: Chest wall soft tissues are unremarkable. No acute bony abnormality. IMPRESSION: No evidence of aortic aneurysm. No acute cardiopulmonary disease. Aortic Atherosclerosis (ICD10-I70.0). Electronically Signed   By: Rolm Baptise M.D.   On: 05/10/2021 21:32   DG Chest Port 1 View  Result Date: 05/10/2021 CLINICAL DATA:  Chest pain EXAM: PORTABLE CHEST 1 VIEW COMPARISON:  03/16/2021 FINDINGS: Mildly elevated right hemidiaphragm. Mild enlargement of the cardiopericardial silhouette, with Kerley B lines and a peripheral interstitial accentuation favoring interstitial pulmonary edema. No overt airspace edema is currently identified. Atherosclerotic calcification of the aortic arch. No blunting of the costophrenic angles. Thoracic spondylosis. IMPRESSION: 1. Mild cardiomegaly with interstitial edema and Kerley B lines. 2. Chronic elevation of the right hemidiaphragm. 3.  Aortic Atherosclerosis (ICD10-I70.0). Electronically Signed   By: Van Clines M.D.   On:  05/10/2021 19:50    Procedures Procedures   Medications Ordered in ED Medications  morphine 4 MG/ML injection 4 mg (4 mg Intravenous Given 05/10/21 1926)  ondansetron (ZOFRAN) injection 4 mg (4 mg Intravenous Given 05/10/21 1926)  HYDROmorphone (DILAUDID) injection 1 mg (1 mg Intravenous Given 05/10/21 2141)    ED Course  I have reviewed the triage vital signs and the nursing notes.  Pertinent labs & imaging results that were available during my care of the patient were reviewed by me and considered in my medical decision making (see chart for details).    MDM Rules/Calculators/A&P                          Pt's cp is atypical, but troponins are bumping.  Pt d/w Dr. Kipp Brood (triad) who recommended a ddimer.  This is added.  He will admit for obs.   Final Clinical Impression(s) / ED Diagnoses Final diagnoses:  Chest pain, unspecified type  CKD (chronic kidney disease) stage 4, GFR 15-29 ml/min [facility])    Rx / DC Orders ED Discharge Orders    None       Isla Pence, MD 05/10/21 2323

## 2021-05-11 ENCOUNTER — Inpatient Hospital Stay (HOSPITAL_COMMUNITY)
Admission: EM | Disposition: A | Discharge status: Home / Self Care | Payer: Self-pay | Source: Home / Self Care | Attending: Internal Medicine

## 2021-05-11 ENCOUNTER — Observation Stay (HOSPITAL_COMMUNITY): Payer: Medicare HMO

## 2021-05-11 ENCOUNTER — Encounter (HOSPITAL_COMMUNITY): Payer: Self-pay | Admitting: Family Medicine

## 2021-05-11 DIAGNOSIS — R079 Chest pain, unspecified: Secondary | ICD-10-CM | POA: Diagnosis not present

## 2021-05-11 DIAGNOSIS — I200 Unstable angina: Secondary | ICD-10-CM | POA: Diagnosis not present

## 2021-05-11 DIAGNOSIS — I5033 Acute on chronic diastolic (congestive) heart failure: Secondary | ICD-10-CM | POA: Diagnosis not present

## 2021-05-11 DIAGNOSIS — I130 Hypertensive heart and chronic kidney disease with heart failure and stage 1 through stage 4 chronic kidney disease, or unspecified chronic kidney disease: Secondary | ICD-10-CM | POA: Diagnosis not present

## 2021-05-11 DIAGNOSIS — R519 Headache, unspecified: Secondary | ICD-10-CM | POA: Diagnosis not present

## 2021-05-11 DIAGNOSIS — Z7901 Long term (current) use of anticoagulants: Secondary | ICD-10-CM | POA: Diagnosis not present

## 2021-05-11 DIAGNOSIS — I700 Atherosclerosis of aorta: Secondary | ICD-10-CM | POA: Diagnosis present

## 2021-05-11 DIAGNOSIS — Z955 Presence of coronary angioplasty implant and graft: Secondary | ICD-10-CM | POA: Diagnosis not present

## 2021-05-11 DIAGNOSIS — E1122 Type 2 diabetes mellitus with diabetic chronic kidney disease: Secondary | ICD-10-CM | POA: Diagnosis not present

## 2021-05-11 DIAGNOSIS — I214 Non-ST elevation (NSTEMI) myocardial infarction: Secondary | ICD-10-CM | POA: Diagnosis not present

## 2021-05-11 DIAGNOSIS — I208 Other forms of angina pectoris: Secondary | ICD-10-CM | POA: Diagnosis not present

## 2021-05-11 DIAGNOSIS — I252 Old myocardial infarction: Secondary | ICD-10-CM | POA: Diagnosis not present

## 2021-05-11 DIAGNOSIS — E782 Mixed hyperlipidemia: Secondary | ICD-10-CM | POA: Diagnosis not present

## 2021-05-11 DIAGNOSIS — Z20822 Contact with and (suspected) exposure to covid-19: Secondary | ICD-10-CM | POA: Diagnosis not present

## 2021-05-11 DIAGNOSIS — R42 Dizziness and giddiness: Secondary | ICD-10-CM | POA: Diagnosis not present

## 2021-05-11 DIAGNOSIS — I2510 Atherosclerotic heart disease of native coronary artery without angina pectoris: Secondary | ICD-10-CM | POA: Diagnosis not present

## 2021-05-11 DIAGNOSIS — R439 Unspecified disturbances of smell and taste: Secondary | ICD-10-CM | POA: Diagnosis present

## 2021-05-11 DIAGNOSIS — Z833 Family history of diabetes mellitus: Secondary | ICD-10-CM | POA: Diagnosis not present

## 2021-05-11 DIAGNOSIS — I5020 Unspecified systolic (congestive) heart failure: Secondary | ICD-10-CM | POA: Diagnosis present

## 2021-05-11 DIAGNOSIS — I69398 Other sequelae of cerebral infarction: Secondary | ICD-10-CM | POA: Diagnosis not present

## 2021-05-11 DIAGNOSIS — R200 Anesthesia of skin: Secondary | ICD-10-CM | POA: Diagnosis present

## 2021-05-11 DIAGNOSIS — D649 Anemia, unspecified: Secondary | ICD-10-CM | POA: Diagnosis not present

## 2021-05-11 DIAGNOSIS — Z79899 Other long term (current) drug therapy: Secondary | ICD-10-CM | POA: Diagnosis not present

## 2021-05-11 DIAGNOSIS — N184 Chronic kidney disease, stage 4 (severe): Secondary | ICD-10-CM | POA: Diagnosis not present

## 2021-05-11 DIAGNOSIS — I480 Paroxysmal atrial fibrillation: Secondary | ICD-10-CM | POA: Diagnosis present

## 2021-05-11 DIAGNOSIS — N1832 Chronic kidney disease, stage 3b: Secondary | ICD-10-CM | POA: Diagnosis present

## 2021-05-11 DIAGNOSIS — Z7982 Long term (current) use of aspirin: Secondary | ICD-10-CM | POA: Diagnosis not present

## 2021-05-11 DIAGNOSIS — I255 Ischemic cardiomyopathy: Secondary | ICD-10-CM | POA: Diagnosis present

## 2021-05-11 DIAGNOSIS — D631 Anemia in chronic kidney disease: Secondary | ICD-10-CM | POA: Diagnosis not present

## 2021-05-11 DIAGNOSIS — Z8249 Family history of ischemic heart disease and other diseases of the circulatory system: Secondary | ICD-10-CM | POA: Diagnosis not present

## 2021-05-11 DIAGNOSIS — I5030 Unspecified diastolic (congestive) heart failure: Secondary | ICD-10-CM | POA: Diagnosis not present

## 2021-05-11 DIAGNOSIS — I10 Essential (primary) hypertension: Secondary | ICD-10-CM | POA: Diagnosis not present

## 2021-05-11 DIAGNOSIS — D509 Iron deficiency anemia, unspecified: Secondary | ICD-10-CM | POA: Diagnosis not present

## 2021-05-11 DIAGNOSIS — R778 Other specified abnormalities of plasma proteins: Secondary | ICD-10-CM | POA: Diagnosis not present

## 2021-05-11 HISTORY — PX: LEFT HEART CATH AND CORONARY ANGIOGRAPHY: CATH118249

## 2021-05-11 LAB — RESP PANEL BY RT-PCR (FLU A&B, COVID) ARPGX2
Influenza A by PCR: NEGATIVE
Influenza B by PCR: NEGATIVE
SARS Coronavirus 2 by RT PCR: NEGATIVE

## 2021-05-11 LAB — BASIC METABOLIC PANEL
Anion gap: 5 (ref 5–15)
Anion gap: 10 (ref 5–15)
BUN: 21 mg/dL (ref 8–23)
BUN: 18 mg/dL (ref 8–23)
CO2: 26 mmol/L (ref 22–32)
CO2: 25 mmol/L (ref 22–32)
Calcium: 9.1 mg/dL (ref 8.9–10.3)
Calcium: 9 mg/dL (ref 8.9–10.3)
Chloride: 110 mmol/L (ref 98–111)
Chloride: 103 mmol/L (ref 98–111)
Creatinine, Ser: 2.04 mg/dL — ABNORMAL HIGH (ref 0.44–1.00)
Creatinine, Ser: 1.85 mg/dL — ABNORMAL HIGH (ref 0.44–1.00)
GFR, Estimated: 24 mL/min — ABNORMAL LOW (ref >60)
GFR, Estimated: 27 mL/min — ABNORMAL LOW (ref >60)
Glucose, Bld: 129 mg/dL — ABNORMAL HIGH (ref 70–99)
Glucose, Bld: 114 mg/dL — ABNORMAL HIGH (ref 70–99)
Potassium: 4.9 mmol/L (ref 3.5–5.1)
Potassium: 4.3 mmol/L (ref 3.5–5.1)
Sodium: 141 mmol/L (ref 135–145)
Sodium: 138 mmol/L (ref 135–145)

## 2021-05-11 LAB — BRAIN NATRIURETIC PEPTIDE: B Natriuretic Peptide: 598.8 pg/mL — ABNORMAL HIGH (ref 0.0–100.0)

## 2021-05-11 LAB — ECHOCARDIOGRAM COMPLETE
AR max vel: 2.24 cm2
AV Area VTI: 2.23 cm2
AV Area mean vel: 2.09 cm2
AV Mean grad: 4 mmHg
AV Peak grad: 7.8 mmHg
Ao pk vel: 1.4 m/s
Calc EF: 41.9 %
Height: 64 in
S' Lateral: 3.5 cm
Single Plane A2C EF: 34.4 %
Single Plane A4C EF: 48.7 %
Weight: 3364.8 oz

## 2021-05-11 LAB — TROPONIN I (HIGH SENSITIVITY)
Troponin I (High Sensitivity): 131 ng/L (ref <18)
Troponin I (High Sensitivity): 171 ng/L (ref <18)

## 2021-05-11 LAB — FERRITIN: Ferritin: 34 ng/mL (ref 11–307)

## 2021-05-11 LAB — IRON AND TIBC
Iron: 29 ug/dL (ref 28–170)
Saturation Ratios: 8 % — ABNORMAL LOW (ref 10.4–31.8)
TIBC: 344 ug/dL (ref 250–450)
UIBC: 315 ug/dL

## 2021-05-11 LAB — D-DIMER, QUANTITATIVE: D-Dimer, Quant: 0.58 ug/mL-FEU — ABNORMAL HIGH (ref 0.00–0.50)

## 2021-05-11 SURGERY — LEFT HEART CATH AND CORONARY ANGIOGRAPHY
Anesthesia: LOCAL

## 2021-05-11 MED ORDER — FENTANYL CITRATE (PF) 100 MCG/2ML IJ SOLN
INTRAMUSCULAR | Status: DC | PRN
  Administered 2021-05-11 (×2): 25 ug via INTRAVENOUS

## 2021-05-11 MED ORDER — ACETAMINOPHEN 325 MG PO TABS: 650.0000 mg | ORAL_TABLET | ORAL | Status: DC | PRN

## 2021-05-11 MED ORDER — ONDANSETRON HCL 4 MG/2ML IJ SOLN: 4.0000 mg | Freq: Four times a day (QID) | INTRAMUSCULAR | Status: DC | PRN

## 2021-05-11 MED ORDER — HEPARIN SODIUM (PORCINE) 1000 UNIT/ML IJ SOLN
INTRAMUSCULAR | Status: AC
  Filled 2021-05-11: qty 1

## 2021-05-11 MED ORDER — PERFLUTREN LIPID MICROSPHERE
1.0000 mL | INTRAVENOUS | Status: AC | PRN
  Administered 2021-05-11: 2 mL via INTRAVENOUS
  Filled 2021-05-11: qty 10

## 2021-05-11 MED ORDER — LIDOCAINE HCL (PF) 1 % IJ SOLN
INTRAMUSCULAR | Status: DC | PRN
  Administered 2021-05-11: 20 mL via INTRADERMAL

## 2021-05-11 MED ORDER — ISOSORB DINITRATE-HYDRALAZINE 20-37.5 MG PO TABS
1.0000 | ORAL_TABLET | Freq: Three times a day (TID) | ORAL | Status: DC
  Administered 2021-05-11 – 2021-05-16 (×14): 1 via ORAL
  Filled 2021-05-11 (×14): qty 1

## 2021-05-11 MED ORDER — SODIUM CHLORIDE 0.9% FLUSH
3.0000 mL | Freq: Two times a day (BID) | INTRAVENOUS | Status: DC
  Administered 2021-05-11: 3 mL via INTRAVENOUS

## 2021-05-11 MED ORDER — HEPARIN (PORCINE) 25000 UT/250ML-% IV SOLN
950.0000 [IU]/h | INTRAVENOUS | Status: DC
  Administered 2021-05-11: 950 [IU]/h via INTRAVENOUS
  Filled 2021-05-11: qty 250

## 2021-05-11 MED ORDER — FUROSEMIDE 10 MG/ML IJ SOLN: 40.0000 mg | Freq: Every day | INTRAMUSCULAR | Status: DC

## 2021-05-11 MED ORDER — SODIUM CHLORIDE 0.9 % WEIGHT BASED INFUSION
3.0000 mL/kg/h | INTRAVENOUS | Status: AC
  Administered 2021-05-11: 3 mL/kg/h via INTRAVENOUS

## 2021-05-11 MED ORDER — SODIUM CHLORIDE 0.9% FLUSH
3.0000 mL | INTRAVENOUS | Status: DC | PRN
  Administered 2021-05-12: 3 mL via INTRAVENOUS

## 2021-05-11 MED ORDER — HEPARIN (PORCINE) 25000 UT/250ML-% IV SOLN
1250.0000 [IU]/h | INTRAVENOUS | Status: DC
  Administered 2021-05-12: 950 [IU]/h via INTRAVENOUS
  Administered 2021-05-13 – 2021-05-14 (×3): 1250 [IU]/h via INTRAVENOUS
  Filled 2021-05-11 (×4): qty 250

## 2021-05-11 MED ORDER — LABETALOL HCL 5 MG/ML IV SOLN
INTRAVENOUS | Status: DC | PRN
  Administered 2021-05-11: 10 mg via INTRAVENOUS

## 2021-05-11 MED ORDER — BUMETANIDE 1 MG PO TABS
1.0000 mg | ORAL_TABLET | Freq: Two times a day (BID) | ORAL | Status: DC
  Filled 2021-05-11 (×2): qty 1

## 2021-05-11 MED ORDER — LOSARTAN POTASSIUM 25 MG PO TABS
25.0000 mg | ORAL_TABLET | Freq: Every day | ORAL | Status: DC
  Administered 2021-05-11: 25 mg via ORAL
  Filled 2021-05-11: qty 1

## 2021-05-11 MED ORDER — SODIUM CHLORIDE 0.9 % IV SOLN: 250.0000 mL | INTRAVENOUS | Status: DC | PRN

## 2021-05-11 MED ORDER — SODIUM CHLORIDE 0.9% FLUSH
3.0000 mL | Freq: Two times a day (BID) | INTRAVENOUS | Status: DC
  Administered 2021-05-12 – 2021-05-15 (×6): 3 mL via INTRAVENOUS

## 2021-05-11 MED ORDER — ASPIRIN EC 81 MG PO TBEC
81.0000 mg | DELAYED_RELEASE_TABLET | Freq: Every day | ORAL | Status: DC
  Administered 2021-05-11 – 2021-05-16 (×6): 81 mg via ORAL
  Filled 2021-05-11 (×6): qty 1

## 2021-05-11 MED ORDER — SODIUM CHLORIDE 0.9 % WEIGHT BASED INFUSION
1.0000 mL/kg/h | INTRAVENOUS | Status: DC
  Administered 2021-05-11: 1 mL/kg/h via INTRAVENOUS

## 2021-05-11 MED ORDER — LIDOCAINE HCL (PF) 1 % IJ SOLN
INTRAMUSCULAR | Status: AC
  Filled 2021-05-11: qty 30

## 2021-05-11 MED ORDER — IOHEXOL 350 MG/ML SOLN
INTRAVENOUS | Status: DC | PRN
  Administered 2021-05-11: 30 mL via INTRA_ARTERIAL

## 2021-05-11 MED ORDER — SODIUM CHLORIDE 0.9% FLUSH: 3.0000 mL | INTRAVENOUS | Status: DC | PRN

## 2021-05-11 MED ORDER — FENTANYL CITRATE (PF) 100 MCG/2ML IJ SOLN
INTRAMUSCULAR | Status: AC
  Filled 2021-05-11: qty 2

## 2021-05-11 MED ORDER — METOPROLOL SUCCINATE ER 50 MG PO TB24
50.0000 mg | ORAL_TABLET | Freq: Every day | ORAL | Status: DC
  Administered 2021-05-11 – 2021-05-16 (×6): 50 mg via ORAL
  Filled 2021-05-11 (×6): qty 1

## 2021-05-11 MED ORDER — LABETALOL HCL 5 MG/ML IV SOLN
INTRAVENOUS | Status: AC
  Filled 2021-05-11: qty 4

## 2021-05-11 MED ORDER — LABETALOL HCL 5 MG/ML IV SOLN: 10.0000 mg | INTRAVENOUS | Status: AC | PRN

## 2021-05-11 MED ORDER — ASPIRIN 81 MG PO CHEW
81.0000 mg | CHEWABLE_TABLET | ORAL | Status: AC
  Administered 2021-05-11: 81 mg via ORAL
  Filled 2021-05-11: qty 1

## 2021-05-11 MED ORDER — APIXABAN 2.5 MG PO TABS
2.5000 mg | ORAL_TABLET | Freq: Two times a day (BID) | ORAL | Status: DC
  Filled 2021-05-11: qty 1

## 2021-05-11 MED ORDER — MIDAZOLAM HCL 2 MG/2ML IJ SOLN
INTRAMUSCULAR | Status: DC | PRN
  Administered 2021-05-11 (×2): 1 mg via INTRAVENOUS

## 2021-05-11 MED ORDER — FENTANYL CITRATE (PF) 100 MCG/2ML IJ SOLN
25.0000 ug | Freq: Once | INTRAMUSCULAR | Status: AC
  Administered 2021-05-11: 25 ug via INTRAVENOUS

## 2021-05-11 MED ORDER — NITROGLYCERIN IN D5W 200-5 MCG/ML-% IV SOLN
0.0000 ug/min | INTRAVENOUS | Status: DC
  Administered 2021-05-11: 5 ug/min via INTRAVENOUS
  Administered 2021-05-12 – 2021-05-13 (×2): 30 ug/min via INTRAVENOUS
  Administered 2021-05-14: 35 ug/min via INTRAVENOUS
  Filled 2021-05-11 (×5): qty 250

## 2021-05-11 MED ORDER — ISOSORBIDE DINITRATE 5 MG PO TABS
45.0000 mg | ORAL_TABLET | Freq: Three times a day (TID) | ORAL | Status: DC
  Filled 2021-05-11: qty 1

## 2021-05-11 MED ORDER — FUROSEMIDE 10 MG/ML IJ SOLN
40.0000 mg | Freq: Every day | INTRAMUSCULAR | Status: DC
  Administered 2021-05-11 – 2021-05-12 (×2): 40 mg via INTRAVENOUS
  Filled 2021-05-11: qty 4

## 2021-05-11 MED ORDER — ROSUVASTATIN CALCIUM 20 MG PO TABS
20.0000 mg | ORAL_TABLET | Freq: Every day | ORAL | Status: DC
  Administered 2021-05-11 – 2021-05-15 (×5): 20 mg via ORAL
  Filled 2021-05-11 (×5): qty 1

## 2021-05-11 MED ORDER — FUROSEMIDE 10 MG/ML IJ SOLN: 40.0000 mg | Freq: Once | INTRAMUSCULAR | Status: DC

## 2021-05-11 MED ORDER — SODIUM CHLORIDE 0.9 % WEIGHT BASED INFUSION: 3.0000 mL/kg/h | INTRAVENOUS | Status: DC

## 2021-05-11 MED ORDER — HYDRALAZINE HCL 20 MG/ML IJ SOLN
INTRAMUSCULAR | Status: AC
  Filled 2021-05-11: qty 1

## 2021-05-11 MED ORDER — HYDRALAZINE HCL 50 MG PO TABS
75.0000 mg | ORAL_TABLET | Freq: Three times a day (TID) | ORAL | Status: DC
  Filled 2021-05-11: qty 2

## 2021-05-11 MED ORDER — HYDRALAZINE HCL 20 MG/ML IJ SOLN
INTRAMUSCULAR | Status: DC | PRN
  Administered 2021-05-11: 10 mg via INTRAVENOUS

## 2021-05-11 MED ORDER — FUROSEMIDE 10 MG/ML IJ SOLN
INTRAMUSCULAR | Status: AC
  Filled 2021-05-11: qty 4

## 2021-05-11 MED ORDER — SODIUM CHLORIDE 0.9 % WEIGHT BASED INFUSION
1.0000 mL/kg/h | INTRAVENOUS | Status: DC
Start: 2021-05-12 — End: 2021-05-11

## 2021-05-11 MED ORDER — HYDRALAZINE HCL 20 MG/ML IJ SOLN: 10.0000 mg | INTRAMUSCULAR | Status: AC | PRN

## 2021-05-11 MED ORDER — VERAPAMIL HCL 2.5 MG/ML IV SOLN
INTRAVENOUS | Status: AC
  Filled 2021-05-11: qty 2

## 2021-05-11 MED ORDER — TRAZODONE HCL 50 MG PO TABS
50.0000 mg | ORAL_TABLET | Freq: Every day | ORAL | Status: DC
  Administered 2021-05-11 – 2021-05-15 (×5): 50 mg via ORAL
  Filled 2021-05-11 (×5): qty 1

## 2021-05-11 MED ORDER — HEPARIN (PORCINE) IN NACL 1000-0.9 UT/500ML-% IV SOLN
INTRAVENOUS | Status: AC
  Filled 2021-05-11: qty 1000

## 2021-05-11 MED ORDER — MIDAZOLAM HCL 2 MG/2ML IJ SOLN
INTRAMUSCULAR | Status: AC
  Filled 2021-05-11: qty 2

## 2021-05-11 SURGICAL SUPPLY — 10 items
CATH INFINITI 5FR AL1 (CATHETERS) ×1 IMPLANT
CATH INFINITI 5FR MULTPACK ANG (CATHETERS) ×1 IMPLANT
KIT HEART LEFT (KITS) ×2 IMPLANT
KIT MICROPUNCTURE NIT STIFF (SHEATH) ×1 IMPLANT
PACK CARDIAC CATHETERIZATION (CUSTOM PROCEDURE TRAY) ×2 IMPLANT
SHEATH PINNACLE 5F 10CM (SHEATH) ×1 IMPLANT
SHEATH PROBE COVER 6X72 (BAG) ×1 IMPLANT
TRANSDUCER W/STOPCOCK (MISCELLANEOUS) ×2 IMPLANT
TUBING CIL FLEX 10 FLL-RA (TUBING) ×2 IMPLANT
WIRE EMERALD 3MM-J .035X150CM (WIRE) ×1 IMPLANT

## 2021-05-11 NOTE — Consult Note (Signed)
Reason for Consult: Chronic kidney disease stage IV with anticipated exposure to iodinated contrast Referring Physician: Hosie Poisson MD [facility])  HPI:  [name] with past medical history significant for coronary artery disease, diastolic heart failure, hypertension, atrial fibrillation and prior history of CVA.  She has chronic kidney disease stage IV following stepwise progression in September of last year during a complicated hospitalization following back surgery with subsequent MI (LAD occlusion) and CVA.  Her baseline creatinine is around 2.0 and she follows up with Kristin [name] at [facility].  She has recently undergone adjustments to diuretics and her ARB for fluctuating renal function.  She presented to the hospital last night with chest pain radiating to her back that was similar in sensation to her heart attack in September, 2021.  Her chest pain was worsened with movement and breathing and did not abate with nitroglycerin.  She denies any associated fever, cough, sputum production, wheezing, nausea, vomiting, diarrhea or diaphoresis.  She is noted to have had a rising troponin and is scheduled for coronary angiogram this afternoon for additional evaluation/management.  She has received losartan and bumetanide this morning after a single dose of intravenous furosemide overnight.  She is currently on normal saline for contrast prophylaxis.  Past Medical History:  Diagnosis Date  . Atrial fibrillation ([place])   . CHF (congestive heart failure) ([place])   . Coronary artery disease   . Diabetes mellitus without complication ([place])   . Hypercholesteremia   . Hypertension   . Renal disorder    CKD Stage IV  . Stroke [facility])     Past Surgical History:  Procedure Laterality Date  . LUMBAR LAMINECTOMY/DECOMPRESSION MICRODISCECTOMY N/A 08/19/2020   Procedure: LUMBAR LAMINECTOMY/DECOMPRESSION  Lumbar two-three, Lumbar three-four;  Surgeon: Dawley, Theodoro Doing, DO;   Location: [place];  Service: Neurosurgery;  Laterality: N/A;  . RIGHT/LEFT HEART CATH AND CORONARY ANGIOGRAPHY N/A 08/23/2020   Procedure: RIGHT/LEFT HEART CATH AND CORONARY ANGIOGRAPHY;  Surgeon: Kristin Prows, MD;  Location: [place] [facility];  Service: Cardiovascular;  Laterality: N/A;    Family History  Problem Relation Age of Onset  . Diabetes Father   . Heart attack Father   . Heart disease Mother   . Hypertension Sister   . Hypertension Sister   . Hypertension Sister   . Hypertension Sister   . Hypertension Sister   . Hypertension Sister   . Hypertension Sister   . Hypertension Sister   . Hypertension Sister   . Hypertension Sister   . Hypertension Sister   . Hypertension Sister     Social History:  reports that she has never smoked. She has never used smokeless tobacco. She reports that she does not drink alcohol and does not use drugs.  Allergies: No Known Allergies  Medications:  Scheduled: . [START ON 05/12/2021] aspirin  81 mg Oral Pre-Cath  . aspirin EC  81 mg Oral Daily  . furosemide  40 mg Intravenous Once  . metoprolol succinate  50 mg Oral Daily  . rosuvastatin  20 mg Oral QHS  . sodium chloride flush  3 mL Intravenous Q12H  . traZODone  50 mg Oral QHS    BMP Latest Ref Rng & Units 05/11/2021 05/10/2021 05/03/2021  Glucose 70 - 99 mg/dL 129(H) 214(H) 112(H)  BUN 8 - 23 mg/dL 21 23 72(H)  Creatinine 0.44 - 1.00 mg/dL 2.04(H) 1.98(H) 2.19(H)  BUN/Creat Ratio 12 - 28 - - 33(H)  Sodium 135 - 145 mmol/L 141 142 143  Potassium 3.5 - 5.1 mmol/L 4.9 3.9 4.3  Chloride 98 - 111 mmol/L 110 107 97  CO2 22 - 32 mmol/L 26 27 32(H)  Calcium 8.9 - 10.3 mg/dL 9.1 9.0 10.2   CBC Latest Ref Rng & Units 05/10/2021 09/25/2020 08/28/2020  WBC 4.0 - 10.5 K/uL 8.0 7.2 10.7(H)  Hemoglobin 12.0 - 15.0 g/dL 10.5(L) 9.3(L) 8.7(L)  Hematocrit 36.0 - 46.0 % 34.0(L) 31.0(L) 27.9(L)  Platelets 150 - 400 K/uL 166 266 387     CT HEAD WO CONTRAST  Result Date: 05/11/2021 CLINICAL DATA:   Dizziness with nonspecific headache.  Slight nausea. EXAM: CT HEAD WITHOUT CONTRAST TECHNIQUE: Contiguous axial images were obtained from the base of the skull through the vertex without intravenous contrast. COMPARISON:  CT head 08/27/2020.  MRI head 08/28/2020. FINDINGS: Brain: There is no evidence of acute intracranial hemorrhage, mass lesion, brain edema or extra-axial fluid collection. There is mild atrophy with mild prominence of the ventricles and subarachnoid spaces. Interval evolution of previously demonstrated infarcts in the right parietal lobe and left superior cerebellum. There is no CT evidence of acute cortical infarction. Patchy small vessel ischemic changes throughout the periventricular white matter. Vascular: Intracranial vascular calcifications. No hyperdense vessel identified. Skull: Negative for fracture or focal lesion. Sinuses/Orbits: The visualized paranasal sinuses and mastoid air cells are clear. No orbital abnormalities are seen. Other: Previous bilateral lens surgery. IMPRESSION: 1. No evidence of acute intracranial process. 2. 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 No CT evidence of acute infarct. Electronically Signed   By: Kristin [name] M.D.   On: 05/11/2021 11:33   CT Chest Wo Contrast  Result Date: 05/10/2021 CLINICAL DATA:  Chest pain, back pain EXAM: CT CHEST WITHOUT CONTRAST TECHNIQUE: Multidetector CT imaging of the chest was performed following the standard protocol without IV contrast. COMPARISON:  None. FINDINGS: Cardiovascular: Heart is normal size. Moderate coronary artery and aortic calcifications. No evidence of aortic aneurysm. Mediastinum/Nodes: No mediastinal, hilar, or axillary adenopathy. Trachea and esophagus are unremarkable. Thyroid unremarkable. Lungs/Pleura: Linear areas of scarring in the lungs bilaterally. No confluent opacities or effusions. Upper Abdomen: Imaging into the upper abdomen  demonstrates no acute findings. Musculoskeletal: Chest wall soft tissues are unremarkable. No acute bony abnormality. IMPRESSION: No evidence of aortic aneurysm. No acute cardiopulmonary disease. Aortic Atherosclerosis (ICD10-I70.0). Electronically Signed   By: Rolm Baptise M.D.   On: 05/10/2021 21:32   DG Chest Port 1 View  Result Date: 05/10/2021 CLINICAL DATA:  Chest pain EXAM: PORTABLE CHEST 1 VIEW COMPARISON:  03/16/2021 FINDINGS: Mildly elevated right hemidiaphragm. Mild enlargement of the cardiopericardial silhouette, with Kerley B lines and a peripheral interstitial accentuation favoring interstitial pulmonary edema. No overt airspace edema is currently identified. Atherosclerotic calcification of the aortic arch. No blunting of the costophrenic angles. Thoracic spondylosis. IMPRESSION: 1. Mild cardiomegaly with interstitial edema and Kerley B lines. 2. Chronic elevation of the right hemidiaphragm. 3.  Aortic Atherosclerosis (ICD10-I70.0). Electronically Signed   By: Van Clines M.D.   On: 05/10/2021 19:50    Review of Systems  Constitutional: Negative for appetite change, chills, fatigue and fever.  HENT: Negative for congestion, ear pain, nosebleeds and sore throat.   Eyes: Negative for photophobia and visual disturbance.  Respiratory: Positive for chest tightness. Negative for shortness of breath and wheezing.   Cardiovascular: Positive for chest pain. Negative for leg swelling.  Gastrointestinal: Negative for abdominal pain, diarrhea, nausea and vomiting.  Endocrine: Negative for cold intolerance, heat  intolerance and polyuria.  Genitourinary: Negative for dysuria, flank pain, hematuria and urgency.  Musculoskeletal: Positive for back pain. Negative for myalgias.       Upper back-radiating from chest  Skin: Negative for rash.  Neurological: Negative for dizziness, tremors and headaches.       Reports some numbness/tingling in her feet only   Blood pressure (!) 155/93, pulse  86, temperature 98.3 F (36.8 C), temperature source Oral, resp. rate 17, height '5\' 4"'$  (1.626 m), weight 95.4 kg, SpO2 99 %. Physical Exam Vitals reviewed.  Constitutional:      General: She is not in acute distress.    Appearance: She is well-developed. She is obese. She is not toxic-appearing.  HENT:     Head: Normocephalic and atraumatic.  Eyes:     Extraocular Movements: Extraocular movements intact.  Neck:     Vascular: No JVD.  Cardiovascular:     Rate and Rhythm: Normal rate and regular rhythm.  No extrasystoles are present.    Heart sounds: Normal heart sounds. No murmur heard.   Pulmonary:     Effort: Pulmonary effort is normal. No tachypnea or accessory muscle usage.     Breath sounds: Normal breath sounds.  Abdominal:     General: Bowel sounds are normal. There is no abdominal bruit.     Palpations: Abdomen is soft.  Musculoskeletal:        General: Normal range of motion.     Cervical back: Normal range of motion.     Right lower leg: No edema.     Left lower leg: No edema.  Skin:    General: Skin is warm and dry.  Neurological:     Mental Status: She is alert and oriented to person, place, and time.  Psychiatric:        Mood and Affect: Mood normal.    Assessment/Plan: 1.  Chronic kidney disease stage IV: Suspected to be from underlying hypertensive nephrosclerosis with stepwise reduction following acute kidney injury during prolonged hospitalization last year; database suggestive of possible cholesterol emboli versus dense ATN.  At this time, her renal function appears to be stable and close to baseline and with the anticipated contrast exposure, she has a 50% chance of acute kidney injury and 12.5% chance of needing renal replacement therapy.  We had a lengthy discussion regarding the risks and benefits and she is willing to proceed with angiogram and undertake dialysis if needed.  I have stopped her losartan and bumetanide which were administered this morning and  ideally should have been stopped on admission.  She is on prophylactic intravenous fluids for volume expansion and to limit risk of contrast nephropathy.  I will continue to follow her closely and resume ARB when GFR is stable at least 48 hours out and diuretics at 24 hours or sooner if needed for volume overload. 2.  Non-ST elevation MI: Rising troponin levels noted and plan noted for coronary angiography this afternoon.  With prior history of coronary artery disease with LAD occlusion. 3.  Hypertension: Blood pressure elevated on nitroglycerin drip, discontinue diuretic and ARB at this time to limit hemodynamic perturbations contributing to renal injury. 4.  Anemia: No overt blood loss and will check iron studies with labs at next draw.  Kristin [name] K. 05/11/2021, 12:33 PM

## 2021-05-11 NOTE — Progress Notes (Signed)
HOSPITAL MEDICINE OVERNIGHT EVENT NOTE    Notified by nursing that patient had slightly rising troponin, most recent one 131 previous troponin being 88 earlier in the morning.  While initially charge nurse did report to me the patient was still having chest discomfort, I spoke to the bedside nurse who stated that the patient is chest pain-free and sleeping at this time.  Will obtain serial EKG.  Will time and additional serial troponin at 10 AM.  I do not believe that there is any need for initiation of heparin infusion at this time.  Patient would likely benefit from formal consultation of cardiology later this morning.  Kristin Emerald  MD [facility]

## 2021-05-11 NOTE — Progress Notes (Signed)
  Echocardiogram 2D Echocardiogram has been performed.  Kristin [name] 05/11/2021, 10:02 AM

## 2021-05-11 NOTE — Telephone Encounter (Signed)
Tried calling patient's daughter to let her referral was defer to pcp

## 2021-05-11 NOTE — H&P (View-Only) (Signed)
CARDIOLOGY CONSULT NOTE  Patient ID: Kristin [name] MRN: [id number] DOB/AGE: Jun 15, 1941 80 y.o.  Admit date: 05/10/2021 Referring Physician: [facility] hospitalist Reason for Consultation: Chest pain  HPI:   80 y.o. African-American female  with coronary artery disease (proximal LAD occlusion, ostial RCA critical stenosis), history of post op MI after spinal surgery (08/2020), along with stroke-medically treated, CKD stage IV, type 2 diabetes mellitus, paroxysmal atrial fibrillation, admitted with chest pain.  Patient sees my partner Dr. Terri Skains for her cardiac care.  She got admitted to [facility] on 05/10/2021 with worsening chest pain.  Patient tells me that she has been having retrosternal chest pain radiating to her back with minimal activity.  Pain got worse in intensity yesterday and not relieved with sublingual nitroglycerin.  She was thus admitted to the hospital.  EKG did not show any acute ischemia.  High-sensitivity troponin elevated at 88--> 131.  BNP elevated 598.  Chest x-ray showed mild cardiomegaly with interstitial edema and curly B-lines.   On a separate note, patient is also been complaining of numbness in her feet and constant head heaviness. She is supposed to see a neurologist at some point, but has not seen one yet.  At baseline, patient CAD was medically managed due to her advanced CKD.  Her creatinine was as high as 3.19 on 04/30/2021, seems to improved to 1.98 today. Of note, her Bumex and losartan was held starting 05/01/2021 given her increased creatinine.  Past Medical History:  Diagnosis Date  . Atrial fibrillation ([place])   . CHF (congestive heart failure) ([place])   . Coronary artery disease   . Diabetes mellitus without complication ([place])   . Hypercholesteremia   . Hypertension   . Renal disorder    CKD Stage IV  . Stroke [facility])      Past Surgical History:  Procedure Laterality Date  . LUMBAR LAMINECTOMY/DECOMPRESSION MICRODISCECTOMY N/A 08/19/2020    Procedure: LUMBAR LAMINECTOMY/DECOMPRESSION  Lumbar two-three, Lumbar three-four;  Surgeon: Dawley, Theodoro Doing, DO;  Location: [place];  Service: Neurosurgery;  Laterality: N/A;  . RIGHT/LEFT HEART CATH AND CORONARY ANGIOGRAPHY N/A 08/23/2020   Procedure: RIGHT/LEFT HEART CATH AND CORONARY ANGIOGRAPHY;  Surgeon: [name], MD;  Location: [place] CV LAB;  Service: Cardiovascular;  Laterality: N/A;      Family History  Problem Relation Age of Onset  . Diabetes Father   . Heart attack Father   . Heart disease Mother   . Hypertension Sister   . Hypertension Sister   . Hypertension Sister   . Hypertension Sister   . Hypertension Sister   . Hypertension Sister   . Hypertension Sister   . Hypertension Sister   . Hypertension Sister   . Hypertension Sister   . Hypertension Sister   . Hypertension Sister      Social History: Social History   Socioeconomic History  . Marital status: Widowed    Spouse name: Not on file  . Number of children: 4  . Years of education: Not on file  . Highest education level: Not on file  Occupational History  . Not on file  Tobacco Use  . Smoking status: Never Smoker  . Smokeless tobacco: Never Used  Vaping Use  . Vaping Use: Never used  Substance and Sexual Activity  . Alcohol use: Never  . Drug use: Never  . Sexual activity: Not on file  Other Topics Concern  . Not on file  Social History Narrative  . Not on file   Social  Determinants of Health   Financial Resource Strain: Not on file  Food Insecurity: Not on file  Transportation Needs: Not on file  Physical Activity: Not on file  Stress: Not on file  Social Connections: Not on file  Intimate Partner Violence: Not on file     Medications Prior to Admission  Medication Sig Dispense Refill Last Dose  . aspirin EC 81 MG tablet Take 1 tablet (81 mg total) by mouth daily. Swallow whole. 90 tablet 3 05/10/2021 at 0800  . bumetanide (BUMEX) 1 MG tablet Take 1 tablet by mouth twice daily  (Patient taking differently: Take 1 mg by mouth 2 (two) times daily.) 60 tablet 6 05/10/2021 at Unknown time  . cholecalciferol (VITAMIN D) 25 MCG (1000 UNIT) tablet Take 3,000 Units by mouth daily.   05/10/2021 at Unknown time  . colchicine 0.6 MG tablet Take 0.5 tablets (0.3 mg total) by mouth daily. 15 tablet 0 05/10/2021 at Unknown time  . diclofenac Sodium (VOLTAREN) 1 % GEL Apply 2 g topically 4 (four) times daily.   Past Month at Unknown time  . ELIQUIS 2.5 MG TABS tablet Take 1 tablet by mouth twice daily (Patient taking differently: Take 2.5 mg by mouth 2 (two) times daily.) 60 tablet 6 05/10/2021 at 0800  . gabapentin (NEURONTIN) 100 MG capsule Take 200 mg by mouth at bedtime.   05/09/2021 at Unknown time  . hydrALAZINE (APRESOLINE) 50 MG tablet Take 1.5 tablets (75 mg total) by mouth 3 (three) times daily. 45 tablet 3 05/10/2021 at Unknown time  . isosorbide dinitrate (ISORDIL) 30 MG tablet Take 1.5 tablets (45 mg total) by mouth 3 (three) times daily. 45 tablet 3 05/10/2021 at 1200  . losartan (COZAAR) 25 MG tablet Take 1 tablet (25 mg total) by mouth daily. 90 tablet 0 05/10/2021 at Unknown time  . methocarbamol (ROBAXIN) 500 MG tablet Take 1 tablet (500 mg total) by mouth every 6 (six) hours as needed for muscle spasms. 30 tablet 0 Past Week at Unknown time  . metoprolol succinate (TOPROL-XL) 50 MG 24 hr tablet Take 50 mg by mouth daily.   05/10/2021 at 0800  . Misc. Devices MISC Please dispense one power stair lift for at home daily use prn debility. 1 Device 0   . nitroGLYCERIN (NITROSTAT) 0.4 MG SL tablet Place 1 tablet (0.4 mg total) under the tongue every 5 (five) minutes as needed for chest pain. 30 tablet 0 PRN  . pantoprazole (PROTONIX) 40 MG tablet Take 1 tablet (40 mg total) by mouth daily. 30 tablet 0 05/10/2021 at Unknown time  . rosuvastatin (CRESTOR) 20 MG tablet Take 1 tablet (20 mg total) by mouth at bedtime. 90 tablet 1 05/09/2021 at Unknown time  . traZODone (DESYREL) 50 MG tablet Take 1  tablet by mouth at bedtime.   05/09/2021 at Unknown time    Review of Systems  Constitutional: Negative for decreased appetite, malaise/fatigue, weight gain and weight loss.  HENT: Negative for congestion.   Eyes: Negative for visual disturbance.  Cardiovascular: Positive for chest pain and dyspnea on exertion. Negative for leg swelling, palpitations and syncope.  Respiratory: Negative for cough.   Endocrine: Negative for cold intolerance.  Hematologic/Lymphatic: Does not bruise/bleed easily.  Skin: Negative for itching and rash.  Musculoskeletal: Negative for myalgias.  Gastrointestinal: Negative for abdominal pain, nausea and vomiting.  Genitourinary: Negative for dysuria.  Neurological: Positive for numbness (In both feet). Negative for dizziness and weakness.       Head heaviness  Psychiatric/Behavioral: The patient is not nervous/anxious.   All other systems reviewed and are negative.     Physical Exam: Physical Exam Vitals and nursing note reviewed.  Constitutional:      General: She is not in acute distress.    Appearance: She is well-developed.  HENT:     Head: Normocephalic and atraumatic.  Eyes:     Conjunctiva/sclera: Conjunctivae normal.     Pupils: Pupils are equal, round, and reactive to light.  Neck:     Vascular: No JVD.  Cardiovascular:     Rate and Rhythm: Normal rate and regular rhythm.     Pulses: Intact distal pulses.          Dorsalis pedis pulses are 2+ on the right side and 1+ on the left side.       Posterior tibial pulses are 0 on the right side and 0 on the left side.     Heart sounds: No murmur heard.   Pulmonary:     Effort: Pulmonary effort is normal.     Breath sounds: Normal breath sounds. No wheezing or rales.  Abdominal:     General: Bowel sounds are normal.     Palpations: Abdomen is soft.     Tenderness: There is no rebound.  Musculoskeletal:        General: No tenderness. Normal range of motion.     Right lower leg: No edema.      Left lower leg: No edema.  Lymphadenopathy:     Cervical: No cervical adenopathy.  Skin:    General: Skin is warm and dry.  Neurological:     Mental Status: She is alert and oriented to person, place, and time.     Cranial Nerves: No cranial nerve deficit.      Labs:   Lab Results  Component Value Date   WBC 8.0 05/10/2021   HGB 10.5 (L) 05/10/2021   HCT 34.0 (L) 05/10/2021   MCV 98.0 05/10/2021   PLT 166 05/10/2021    Recent Labs  Lab 05/10/21 1919  NA 142  K 3.9  CL 107  CO2 27  BUN 23  CREATININE 1.98*  CALCIUM 9.0  GLUCOSE 214*    Lipid Panel     Component Value Date/Time   CHOL 130 08/29/2020 0250   TRIG 89 08/29/2020 0250   HDL 47 08/29/2020 0250   CHOLHDL 2.8 08/29/2020 0250   VLDL 18 08/29/2020 0250   LDLCALC 65 08/29/2020 0250    BNP (last 3 results) Recent Labs    08/26/20 0505 08/27/20 0818 05/11/21 0036  BNP 675.7* 424.0* 598.8*    HEMOGLOBIN A1C Lab Results  Component Value Date   HGBA1C 6.7 (H) 08/27/2020   MPG 145.59 08/27/2020    Cardiac Panel (last 3 results) Recent Labs    08/18/20 0200  CKTOTAL 87    Lab Results  Component Value Date   CKTOTAL 54 08/18/2020     TSH Recent Labs    08/22/20 1759  TSH 1.054      Radiology: CT Chest Wo Contrast  Result Date: 05/10/2021 CLINICAL DATA:  Chest pain, back pain EXAM: CT CHEST WITHOUT CONTRAST TECHNIQUE: Multidetector CT imaging of the chest was performed following the standard protocol without IV contrast. COMPARISON:  None. FINDINGS: Cardiovascular: Heart is normal size. Moderate coronary artery and aortic calcifications. No evidence of aortic aneurysm. Mediastinum/Nodes: No mediastinal, hilar, or axillary adenopathy. Trachea and esophagus are unremarkable. Thyroid unremarkable. Lungs/Pleura: Linear areas of scarring in the  lungs bilaterally. No confluent opacities or effusions. Upper Abdomen: Imaging into the upper abdomen demonstrates no acute findings. Musculoskeletal:  Chest wall soft tissues are unremarkable. No acute bony abnormality. IMPRESSION: No evidence of aortic aneurysm. No acute cardiopulmonary disease. Aortic Atherosclerosis (ICD10-I70.0). Electronically Signed   By: Rolm Baptise M.D.   On: 05/10/2021 21:32   DG Chest Port 1 View  Result Date: 05/10/2021 CLINICAL DATA:  Chest pain EXAM: PORTABLE CHEST 1 VIEW COMPARISON:  03/16/2021 FINDINGS: Mildly elevated right hemidiaphragm. Mild enlargement of the cardiopericardial silhouette, with Kerley B lines and a peripheral interstitial accentuation favoring interstitial pulmonary edema. No overt airspace edema is currently identified. Atherosclerotic calcification of the aortic arch. No blunting of the costophrenic angles. Thoracic spondylosis. IMPRESSION: 1. Mild cardiomegaly with interstitial edema and Kerley B lines. 2. Chronic elevation of the right hemidiaphragm. 3.  Aortic Atherosclerosis (ICD10-I70.0). Electronically Signed   By: Van Clines M.D.   On: 05/10/2021 19:50    Scheduled Meds: . aspirin EC  81 mg Oral Daily  . bumetanide  1 mg Oral BID  . furosemide  40 mg Intravenous Once  . hydrALAZINE  75 mg Oral Q8H  . isosorbide dinitrate  45 mg Oral TID  . losartan  25 mg Oral Daily  . metoprolol succinate  50 mg Oral Daily  . rosuvastatin  20 mg Oral QHS  . traZODone  50 mg Oral QHS   Continuous Infusions: . heparin     PRN Meds:.acetaminophen, ondansetron (ZOFRAN) IV  CARDIAC STUDIES:  EKG 05/11/2021: Sinus rhythm LVH Old anteroseptal infarct  Echocardiogram 12/15/2020: 1. The distal inferoseptum and apex appear mildly hypokinetic, but WMA is  much improved compared with prior study. LVEF has improved considerably.  Left ventricular ejection fraction, by estimation, is 55 to 60%. Left  ventricular ejection fraction by 3D  volume is 56 %. The left ventricle has normal function. The left ventricle  demonstrates regional wall motion abnormalities (see scoring  diagram/findings for  description). Left ventricular diastolic parameters  are consistent with Grade I diastolic  dysfunction (impaired relaxation).  2. Right ventricular systolic function is normal. The right ventricular  size is normal. There is normal pulmonary artery systolic pressure. The  estimated right ventricular systolic pressure is 99991111 mmHg.  3. The mitral valve is grossly normal. Trivial mitral valve  regurgitation. No evidence of mitral stenosis.  4. The aortic valve is tricuspid. There is mild calcification of the  aortic valve. There is mild thickening of the aortic valve. Aortic valve  regurgitation is not visualized. Mild aortic valve sclerosis is present,  with no evidence of aortic valve  stenosis.  5. The inferior vena cava is normal in size with greater than 50%  respiratory variability, suggesting right atrial pressure of 3 mmHg.   Left and right heart catheterization 08/23/2020: RA 13/15, mean 12, RA saturation 56%; RV 51/8, EDP 12 mmHg; PA 54/26, mean 38 mmHg, PA saturation 45%; PW 21/21, mean 20 mmHg, aortic saturation 97%. Cardiac output 4.96, cardiac index 2.51.  SVR elevated at 1258.  PVR mildly elevated at 290.  QP/QS 0.79.  Left main: Mild disease. LAD: Proximal 80% stenosis followed by 100% occlusion.  Before the occlusion, at the subtotal site, there is a large diagonal that comes off and has secondary branches.  Apical LAD is barely visualized with minimal amount of collaterals.  Thrombus is evident at the site of occlusion. Circumflex: Large vessel, moderate-sized OM1 and moderate-sized OM 2.  Minimal disease. RCA: Ostial 80 to  90% stenosis.  Mild disease in the remaining of the vessel, PL branch has a 50% stenosis of the proximal and ostial segment.  Severe dampening was evident with engagement of 5 French diagnostic catheter. LV: EDP mildly elevated at 15- 18 mmHg.  No pressure gradient across the aortic valve.  LVEF 30 to 35% with entire anterior, anterolateral, apical and  apical inferior akinesis.  No significant MR.  Impression: Patient's presentation is consistent with acute systolic heart failure from recent acute myocardial infarction.  Patient is postop spine surgery and not a candidate for anticoagulation with full dose or with antiplatelet therapy for now in view of risk of development of spine hematoma and paraparesis.  Also the infarct appears to have been completed.  As her EDP is only 15 and wedge although shows mild elevation at 20, she is saturating well and heart rate is much better improved and controlled with regard to atrial fibrillation.  Hence I did not proceed with implantation of intra-aortic balloon pump.  Will be aggressive with fluid management on the floor and watch her carefully.  Have a low threshold for intubation if necessary.  I suspect she probably will recover as it has been greater than 24 hours since her infarct.   Assessment & Recommendations:  80 y.o. African-American female  with coronary artery disease (proximal LAD occlusion, ostial RCA critical stenosis), history of post op MI after spinal surgery (08/2020), along with stroke-medically treated, CKD stage IV, type 2 diabetes mellitus, paroxysmal atrial fibrillation, admitted with chest pain.  Chest pain: Two-vessel CAD.  No chest pain at rest with troponin elevation suggestive of non-STEMI. Recommend IV heparin and IV nitroglycerin.  Hold Eliquis.  Continue aspirin. Discussed the risks and benefits of invasive work-up with patient and her daughter.  It is evident that she is more symptomatic from her CAD, now having non-STEMI.  In addition to continue medical therapy, she needs repeat invasive evaluation for decision-making of revascularization.  This obviously needs to be weighed with risks of contrast-induced nephropathy given her underlying CKD stage IV.  Her creatinine is now around 2, which is lower compared to 10 days ago, around 3.1.  I think this is our best window of  opportunity to repeat coronary angiogram.  I recommend staging intervention, whether surgical or percutaneous, on another day.  Recommend IV hydration.  Appreciate nephrology consult.  I discussed the risks of contrast-induced nephropathy and risk of dialysis of about 2% with patient and daughter.  Both are in agreement that patient proceed with diagnostic angiogram today.  I will use femoral access in order to minimize contrast use, as well as to avoid jeopardizing her radial arteries, in case she needs dialysis access in the future. Continue metoprolol, statin.  Hold losartan.  Headache: Constant headedness.  History of stroke.  Consider CT head to rule out any acute pathology.     Nigel Mormon, MD Pager: [phone] Office: [phone]

## 2021-05-11 NOTE — Interval H&P Note (Signed)
History and Physical Interval Note:  05/11/2021 3:46 PM  Kristin [name]  has presented today for surgery, with the diagnosis of chest pain.  The various methods of treatment have been discussed with the patient and family. After consideration of risks, benefits and other options for treatment, the patient has consented to  Procedure(s): LEFT HEART CATH AND CORONARY ANGIOGRAPHY (N/A) as a surgical intervention.  The patient's history has been reviewed, patient examined, no change in status, stable for surgery.  I have reviewed the patient's chart and labs.  Questions were answered to the patient's satisfaction.    2016 Appropriate Use Criteria for Coronary Revascularization in Patients With Acute Coronary Syndrome NSTEMI/UA High Risk (TIMI Score 5-7) NSTEMI/Unstable angina, stabilized patient at high risk Link Here: sistemancia.com Indication:  Revascularization by PCI or CABG of 1 or more arteries in a patient with NSTEMI or unstable angina with Stabilization after presentation High risk for clinical events  A (7) Indication: 16; Score 7   [place]

## 2021-05-11 NOTE — Progress Notes (Signed)
Kristin [name] is a 80 y.o. female with medical history significant for CAD, A-fib, HFpEF, CKD 4, HTN who presents for evaluation of chest pain.  She reports over the last 2 days she has been experiencing chest pain that goes across her entire chest region and will radiate around to her back.  She states that she had an a heart attack in September 2021 and she was concerned of having another heart attack. She was started on IV heparin and IV nitro. Cardiology consulted and she is scheduled for cardiac catheterization later today.   Nephrology consulted for CKD  Pt admitted by Dr Tonie Griffith earlier this am, please see his note for detailed H&P.  Hosie Poisson

## 2021-05-11 NOTE — H&P (Signed)
History and Physical    Kristin [name] [id number] DOB: March 02, 1941 DOA: 05/10/2021  PCP: Caren Macadam, MD   Patient coming from: Home  Chief Complaint: Chest pain  HPI: Kristin [name] is a 80 y.o. female with medical history significant for CAD, A-fib, HFpEF, CKD 4, HTN who presents for evaluation of chest pain.  She reports over the last 2 days she has been experiencing chest pain that goes across her entire chest region and will radiate around to her back.  She states that she had an a heart attack in September 2021 and she was concerned of having another heart attack.  She reports that pain was worsened with any movement and taking a deep breath.  She took nitroglycerin at home which did not seem to help.  She reports the pain was a 10 out of 10 at its worst.  She has not had any fever, cough, abdominal pain, nausea, vomiting, diarrhea.  She denies any recent falls or injuries.  She reports the pain is a dull pain with occasional sharp intermittent stabbing of pain.  She reports has been compliant with her medications.  She states that she was told she was diabetic a few years ago but when she moved to [place] from [place] she was told she was not diabetic and she is not on any medications for diabetes.  ED Course: Ms. Ludlam has been hemodynamically stable in the emergency room.  She showed no acute ischemic changes.  Creatinine 1.98 with a BUN of 23.  Electrolytes are normal.  CBC is unremarkable.  Troponin was elevated at 75 initially and then repeat level was 88.  Glucose was 214.  Hospitalist service asked to manage patient overnight.  Patient echocardiogram in January 2022 which is reviewed.  It showed an EF of 55 to 60% with some left ventricular wall motion abnormalities.  Review of Systems:  General: Denies fever, chills, weight loss, night sweats.  Denies dizziness.  Denies change in appetite HENT: Denies head trauma, headache, denies change in hearing, tinnitus.  Denies  nasal bleeding.  Denies sore throat, sores in mouth.  Denies difficulty swallowing Eyes: Denies blurry vision, pain in eye, drainage.  Denies discoloration of eyes. Neck: Denies pain.  Denies swelling.  Denies pain with movement. Cardiovascular: Reports chest pain. Denies palpitations. Reports edema.  Denies orthopnea Respiratory: Reports shortness of breath. Denies cough.  Denies wheezing.  Denies sputum production Gastrointestinal: Denies abdominal pain, swelling.  Denies nausea, vomiting, diarrhea.  Denies melena.  Denies hematemesis. Musculoskeletal: Denies limitation of movement. Denies deformity or swelling. Denies arthralgias or myalgias. Genitourinary: Denies pelvic pain.  Denies urinary frequency or hesitancy.  Denies dysuria.  Skin: Denies rash.  Denies petechiae, purpura, ecchymosis. Neurological: Denies headache.  Denies syncope.  Denies seizure activity.  Denies paresthesia.  Denies slurred speech, drooping face.  Denies visual change. Psychiatric: Denies depression, anxiety. Denies hallucinations.  Past Medical History:  Diagnosis Date  . Atrial fibrillation ([place])   . CHF (congestive heart failure) ([place])   . Coronary artery disease   . Diabetes mellitus without complication ([place])   . Hypercholesteremia   . Hypertension   . Renal disorder    CKD Stage IV  . Stroke [facility])     Past Surgical History:  Procedure Laterality Date  . LUMBAR LAMINECTOMY/DECOMPRESSION MICRODISCECTOMY N/A 08/19/2020   Procedure: LUMBAR LAMINECTOMY/DECOMPRESSION  Lumbar two-three, Lumbar three-four;  Surgeon: Dawley, Theodoro Doing, DO;  Location: [facility];  Service: Neurosurgery;  Laterality: N/A;  . RIGHT/LEFT HEART CATH  AND CORONARY ANGIOGRAPHY N/A 08/23/2020   Procedure: RIGHT/LEFT HEART CATH AND CORONARY ANGIOGRAPHY;  Surgeon: [name], MD;  Location: [facility];  Service: Cardiovascular;  Laterality: N/A;    Social History  reports that she has never smoked. She has never used smokeless tobacco.  She reports that she does not drink alcohol and does not use drugs.  No Known Allergies  Family History  Problem Relation Age of Onset  . Diabetes Father   . Heart attack Father   . Heart disease Mother   . Hypertension Sister   . Hypertension Sister   . Hypertension Sister   . Hypertension Sister   . Hypertension Sister   . Hypertension Sister   . Hypertension Sister   . Hypertension Sister   . Hypertension Sister   . Hypertension Sister   . Hypertension Sister   . Hypertension Sister      Prior to Admission medications   Medication Sig Start Date End Date Taking? Authorizing Provider  aspirin EC 81 MG tablet Take 1 tablet (81 mg total) by mouth daily. Swallow whole. 10/27/20   Tolia, Sunit, DO  bumetanide (BUMEX) 1 MG tablet Take 1 tablet by mouth twice daily 11/23/20   [name], MD  cholecalciferol (VITAMIN D) 25 MCG (1000 UNIT) tablet Take 3,000 Units by mouth daily.    [provider]  colchicine 0.6 MG tablet Take 0.5 tablets (0.3 mg total) by mouth daily. 08/31/20   Aline August, MD  diclofenac Sodium (VOLTAREN) 1 % GEL Apply 2 g topically 4 (four) times daily. 08/30/20   Aline August, MD  ELIQUIS 2.5 MG TABS tablet Take 1 tablet by mouth twice daily 11/23/20   [name], MD  gabapentin (NEURONTIN) 100 MG capsule Take 200 mg by mouth at bedtime. 09/22/20   [provider]  hydrALAZINE (APRESOLINE) 50 MG tablet Take 1.5 tablets (75 mg total) by mouth 3 (three) times daily. 04/13/21   Tolia, Sunit, DO  isosorbide dinitrate (ISORDIL) 30 MG tablet Take 1.5 tablets (45 mg total) by mouth 3 (three) times daily. 04/13/21   Tolia, Sunit, DO  losartan (COZAAR) 25 MG tablet Take 1 tablet (25 mg total) by mouth daily. 04/06/21 07/05/21  Tolia, Sunit, DO  methocarbamol (ROBAXIN) 500 MG tablet Take 1 tablet (500 mg total) by mouth every 6 (six) hours as needed for muscle spasms. 08/30/20   Aline August, MD  metoprolol succinate (TOPROL-XL) 50 MG 24 hr tablet Take 50  mg by mouth daily. 09/22/20   [provider]  Misc. Devices MISC Please dispense one power stair lift for at home daily use prn debility. 03/15/20   Nicolette Bang, DO  nitroGLYCERIN (NITROSTAT) 0.4 MG SL tablet Place 1 tablet (0.4 mg total) under the tongue every 5 (five) minutes as needed for chest pain. 09/29/20   Tolia, Sunit, DO  pantoprazole (PROTONIX) 40 MG tablet Take 1 tablet (40 mg total) by mouth daily. 08/30/20   Aline August, MD  rosuvastatin (CRESTOR) 20 MG tablet Take 1 tablet (20 mg total) by mouth at bedtime. 03/16/20   Nicolette Bang, DO  traZODone (DESYREL) 50 MG tablet Take 1 tablet by mouth at bedtime. 03/16/21   [provider]    Physical Exam: Vitals:   05/10/21 2100 05/10/21 2130 05/10/21 2200 05/10/21 2230  BP: (!) 153/88 (!) 143/87 140/90 136/85  Pulse: 96 (!) 102 (!) 107 (!) 103  Resp: 16 (!) 37 18 (!) 21  Temp:  TempSrc:      SpO2: 92% 92% 95% 93%  Weight:      Height:        Constitutional: NAD, calm, comfortable Vitals:   05/10/21 2100 05/10/21 2130 05/10/21 2200 05/10/21 2230  BP: (!) 153/88 (!) 143/87 140/90 136/85  Pulse: 96 (!) 102 (!) 107 (!) 103  Resp: 16 (!) 37 18 (!) 21  Temp:      TempSrc:      SpO2: 92% 92% 95% 93%  Weight:      Height:       General: WDWN, Alert and oriented x3.  Eyes: EOMI, PERRL, conjunctivae normal.  Sclera nonicteric HENT:  [place]/AT, external ears normal.  Nares patent without epistasis.  Mucous membranes are moist. Neck: Soft, normal range of motion, supple, no masses, no thyromegaly.  Trachea midline Respiratory: Equal breath sounds mild diffuse rales. no wheezing, no crackles. Normal respiratory effort. No accessory muscle use.  Cardiovascular: Regular rate and rhythm, no murmurs / rubs / gallops. Mild pedal edema. 1+ pedal pulses  Abdomen: Soft, no tenderness, nondistended, no rebound or guarding. No masses palpated. Bowel sounds normoactive Musculoskeletal: FROM. no  cyanosis. Normal movement of all extremitis Skin: Warm, dry, intact. No rash. No ulcers. No induration Neurologic: CN 2-12 grossly intact.  Normal speech.  Sensation intact, Strength 5/5 in all extremities.   Psychiatric: Normal judgment and insight.  Normal mood.    Labs on Admission: I have personally reviewed following labs and imaging studies  CBC: Recent Labs  Lab 05/10/21 1919  WBC 8.0  HGB 10.5*  HCT 34.0*  MCV 98.0  PLT [id number]    Basic Metabolic Panel: Recent Labs  Lab 05/10/21 1919  NA 142  K 3.9  CL 107  CO2 27  GLUCOSE 214*  BUN 23  CREATININE 1.98*  CALCIUM 9.0    GFR: Estimated Creatinine Clearance: 25 mL/min (A) (by C-G formula based on SCr of 1.98 mg/dL (H)).  Liver Function Tests: No results for input(s): AST, ALT, ALKPHOS, BILITOT, PROT, ALBUMIN in the last 168 hours.  Urine analysis:    Component Value Date/Time   COLORURINE YELLOW 09/25/2020 1918   APPEARANCEUR CLOUDY (A) 09/25/2020 1918   LABSPEC 1.013 09/25/2020 1918   PHURINE 5.0 09/25/2020 1918   GLUCOSEU NEGATIVE 09/25/2020 1918   HGBUR NEGATIVE 09/25/2020 1918   BILIRUBINUR NEGATIVE 09/25/2020 1918   KETONESUR NEGATIVE 09/25/2020 1918   PROTEINUR NEGATIVE 09/25/2020 1918   NITRITE NEGATIVE 09/25/2020 1918   LEUKOCYTESUR NEGATIVE 09/25/2020 1918    Radiological Exams on Admission: CT Chest Wo Contrast  Result Date: 05/10/2021 CLINICAL DATA:  Chest pain, back pain EXAM: CT CHEST WITHOUT CONTRAST TECHNIQUE: Multidetector CT imaging of the chest was performed following the standard protocol without IV contrast. COMPARISON:  None. FINDINGS: Cardiovascular: Heart is normal size. Moderate coronary artery and aortic calcifications. No evidence of aortic aneurysm. Mediastinum/Nodes: No mediastinal, hilar, or axillary adenopathy. Trachea and esophagus are unremarkable. Thyroid unremarkable. Lungs/Pleura: Linear areas of scarring in the lungs bilaterally. No confluent opacities or effusions. Upper  Abdomen: Imaging into the upper abdomen demonstrates no acute findings. Musculoskeletal: Chest wall soft tissues are unremarkable. No acute bony abnormality. IMPRESSION: No evidence of aortic aneurysm. No acute cardiopulmonary disease. Aortic Atherosclerosis (ICD10-I70.0). Electronically Signed   By: Rolm Baptise M.D.   On: 05/10/2021 21:32   DG Chest Port 1 View  Result Date: 05/10/2021 CLINICAL DATA:  Chest pain EXAM: PORTABLE CHEST 1 VIEW COMPARISON:  03/16/2021 FINDINGS: Mildly elevated right hemidiaphragm.  Mild enlargement of the cardiopericardial silhouette, with Kerley B lines and a peripheral interstitial accentuation favoring interstitial pulmonary edema. No overt airspace edema is currently identified. Atherosclerotic calcification of the aortic arch. No blunting of the costophrenic angles. Thoracic spondylosis. IMPRESSION: 1. Mild cardiomegaly with interstitial edema and Kerley B lines. 2. Chronic elevation of the right hemidiaphragm. 3.  Aortic Atherosclerosis (ICD10-I70.0). Electronically Signed   By: Van Clines M.D.   On: 05/10/2021 19:50    EKG: Independently reviewed.  EKG shows sinus tachycardia with occasional PACs.  No acute ST elevation or depression.  QTc 433  Assessment/Plan Principal Problem:   Chest pain Ms. Nutley is placed on cardiac telemetry for observation for chest pain.  Reviewed echocardiogram that was done in January 2022.  At that time patient had an EF of 55 to 60% with some left ventricular wall motion abnormality. antiplatelet therapy with aspirin daily.  Continue statin therapy.  Monitor blood pressure.  Nitroglycerin as needed.  Pulmonal oxygen as needed to maintain O2 sat between 92-96% With mild pulmonary edema on chest x-ray.  Given dose of Lasix.  We will continue on home dose of Bumex in the morning  Active Problems:   Hypertension New home medication of metoprolol, Cozaar, isosorbide, hydralazine.  Monitor blood pressure    CKD (chronic kidney  disease) stage 4, GFR 15-29 ml/min  Stable.  Monitor electrolytes and renal function with labs in morning    Elevated troponin Check Serial troponins. Has hx of CAD and had MI in Sept. 2021.    Diabetes mellitus Currently not on treatment. Check HgbA1c level as blood sugar is over 200.     DVT prophylaxis: Is anticoagulated on Eliquis which will be continued Code Status:   Full code Family Communication:  Diagnosis and plan discussed with patient.  Verbalized understanding agrees with plan.  Further recommendations to follow as clinically indicated Disposition Plan:   Patient is from:  Home  Anticipated DC to:  Home  Anticipated DC date:  Anticipate less than 2 midnight stay  Anticipated DC barriers: Barriers to discharge identified at this time  Admission status:  Observation  Eben Burow MD [facility]  How to contact the [facility] Attending or Consulting provider [place] or covering provider during after hours [place], for this patient?   1. Check the care team in [facility] and look for a) attending/consulting [facility] provider listed and b) the [facility] team listed 2. Log into [email or web address] and use [place]'s universal password to access. If you do not have the password, please contact the hospital operator. 3. Locate the [facility] provider you are looking for under [facility] and page to a number that you can be directly reached. 4. If you still have difficulty reaching the provider, please page the [facility] (Director on Call) for the Hospitalists listed on amion for assistance.  05/11/2021, 12:21 AM

## 2021-05-11 NOTE — Progress Notes (Signed)
Site area: Right  Groin a 5 french sheath was removed by Wynonia Sours RN  Site Prior to Removal:  Level 0  Pressure Applied For 20 MINUTES     Bedrest Beginning at 1730p X 4 hours  Manual:   Yes.    Patient Status During Pull:  stable  Post Pull Groin Site:  Level 0  Post Pull Instructions Given:  Yes.    Post Pull Pulses Present:  Yes.    Dressing Applied:  Yes.    Comments:

## 2021-05-11 NOTE — Progress Notes (Signed)
ANTICOAGULATION CONSULT NOTE - Initial Consult  Pharmacy Consult for heparin Indication: chest pain/ACS and atrial fibrillation  No Known Allergies  Patient Measurements: Height: '5\' 4"'$  (162.6 cm) Weight: 95.4 kg (210 lb 4.8 oz) IBW/kg (Calculated) : 54.7 Heparin Dosing Weight:77kg  Vital Signs: Temp: 97.7 F (36.5 C) (06/03 0700) Temp Source: Oral (06/03 0700) BP: 146/86 (06/03 0700) Pulse Rate: 96 (06/03 0330)  Labs: Recent Labs    05/10/21 1919 05/10/21 2119 05/11/21 0438  HGB 10.5*  --   --   HCT 34.0*  --   --   PLT 166  --   --   CREATININE 1.98*  --   --   TROPONINIHS 75* 88* 131*    Estimated Creatinine Clearance: 25.4 mL/min (A) (by C-G formula based on SCr of 1.98 mg/dL (H)).   Medical History: Past Medical History:  Diagnosis Date  . Atrial fibrillation ([place])   . CHF (congestive heart failure) ([place])   . Coronary artery disease   . Diabetes mellitus without complication ([place])   . Hypercholesteremia   . Hypertension   . Renal disorder    CKD Stage IV  . Stroke [facility])     Assessment: 80 year old female with history of afib on apixaban. Appears her last dose of medication was yesterday 6/2 at 0800. New orders to transition to heparin for possible ACS given tropinin bump.  Hemoglobin 10.5 and plt 166 on admit. Given recent apixaban use will use appt to monitor and dose adjust heparin.   Goal of Therapy:  Heparin level 0.3-0.7 units/ml aPTT 66-102 seconds Monitor platelets by anticoagulation protocol: Yes   Plan:  Start heparin infusion at 950  units/hr Check anti-Xa and aptt in 8 hours and daily while on heparin Continue to monitor H&H and platelets  Erin Hearing PharmD., BCPS Clinical Pharmacist 05/11/2021 9:13 AM

## 2021-05-11 NOTE — Progress Notes (Signed)
Critical value Troponin 131, pt was resting comfortably and when questioned by this RN if she was having any chest pain. Her response was 8/10. Primary RN Cyd Silence, MD made aware. Will continue to monitor.   Elaina Hoops, RN

## 2021-05-11 NOTE — Progress Notes (Signed)
ANTICOAGULATION CONSULT NOTE  Pharmacy Consult for heparin Indication: chest pain/ACS and atrial fibrillation  No Known Allergies  Patient Measurements: Height: '5\' 4"'$  (162.6 cm) Weight: 95.4 kg (210 lb 4.8 oz) IBW/kg (Calculated) : 54.7 Heparin Dosing Weight:77kg  Vital Signs: Temp: 98 F (36.7 C) (06/03 1823) Temp Source: Oral (06/03 1823) BP: 178/101 (06/03 1745) Pulse Rate: 103 (06/03 1823)  Labs: Recent Labs    05/10/21 1919 05/10/21 2119 05/11/21 0438 05/11/21 1004  HGB 10.5*  --   --   --   HCT 34.0*  --   --   --   PLT 166  --   --   --   CREATININE 1.98*  --   --  2.04*  TROPONINIHS 75* 88* 131* 171*    Estimated Creatinine Clearance: 24.7 mL/min (A) (by C-G formula based on SCr of 2.04 mg/dL (H)).   Medical History: Past Medical History:  Diagnosis Date  . Atrial fibrillation ([place])   . CHF (congestive heart failure) ([place])   . Coronary artery disease   . Diabetes mellitus without complication ([place])   . Hypercholesteremia   . Hypertension   . Renal disorder    CKD Stage IV  . Stroke [facility])     Assessment: 80 year old female with history of afib on apixaban. Appears her last dose of medication was yesterday 6/2 at 0800. New orders to transition to heparin for possible ACS given tropinin bump.  Pt s/p cath with 2vCAD, to restart heparin while deciding on revascularization plans. Sheaths removed ~1730, will start heparin no bolus 8h after.  Goal of Therapy:  Heparin level 0.3-0.7 units/ml aPTT 66-102 seconds Monitor platelets by anticoagulation protocol: Yes   Plan:  Resume heparin 950 units/h no bolus at 0130 Check 8h heparin level   Arrie Senate, PharmD, BCPS, [facility] Clinical Pharmacist [phone] Please check AMION for all [facility] Pharmacy numbers 05/11/2021

## 2021-05-11 NOTE — Consult Note (Signed)
CARDIOLOGY CONSULT NOTE  Patient ID: Kristin [name] MRN: [id number] DOB/AGE: October 15, 1941 80 y.o.  Admit date: 05/10/2021 Referring Physician: [facility] hospitalist Reason for Consultation: Chest pain  HPI:   80 y.o. African-American female  with coronary artery disease (proximal LAD occlusion, ostial RCA critical stenosis), history of post op MI after spinal surgery (08/2020), along with stroke-medically treated, CKD stage IV, type 2 diabetes mellitus, paroxysmal atrial fibrillation, admitted with chest pain.  Patient sees my partner Dr. Terri Skains for her cardiac care.  She got admitted to [facility] on 05/10/2021 with worsening chest pain.  Patient tells me that she has been having retrosternal chest pain radiating to her back with minimal activity.  Pain got worse in intensity yesterday and not relieved with sublingual nitroglycerin.  She was thus admitted to the hospital.  EKG did not show any acute ischemia.  High-sensitivity troponin elevated at 88--> 131.  BNP elevated 598.  Chest x-ray showed mild cardiomegaly with interstitial edema and curly B-lines.   On a separate note, patient is also been complaining of numbness in her feet and constant head heaviness. She is supposed to see a neurologist at some point, but has not seen one yet.  At baseline, patient CAD was medically managed due to her advanced CKD.  Her creatinine was as high as 3.19 on 04/30/2021, seems to improved to 1.98 today. Of note, her Bumex and losartan was held starting 05/01/2021 given her increased creatinine.  Past Medical History:  Diagnosis Date  . Atrial fibrillation ([place])   . CHF (congestive heart failure) ([place])   . Coronary artery disease   . Diabetes mellitus without complication ([place])   . Hypercholesteremia   . Hypertension   . Renal disorder    CKD Stage IV  . Stroke [facility])      Past Surgical History:  Procedure Laterality Date  . LUMBAR LAMINECTOMY/DECOMPRESSION MICRODISCECTOMY N/A 08/19/2020    Procedure: LUMBAR LAMINECTOMY/DECOMPRESSION  Lumbar two-three, Lumbar three-four;  Surgeon: Dawley, Theodoro Doing, DO;  Location: [facility];  Service: Neurosurgery;  Laterality: N/A;  . RIGHT/LEFT HEART CATH AND CORONARY ANGIOGRAPHY N/A 08/23/2020   Procedure: RIGHT/LEFT HEART CATH AND CORONARY ANGIOGRAPHY;  Surgeon: [name], MD;  Location: [facility];  Service: Cardiovascular;  Laterality: N/A;      Family History  Problem Relation Age of Onset  . Diabetes Father   . Heart attack Father   . Heart disease Mother   . Hypertension Sister   . Hypertension Sister   . Hypertension Sister   . Hypertension Sister   . Hypertension Sister   . Hypertension Sister   . Hypertension Sister   . Hypertension Sister   . Hypertension Sister   . Hypertension Sister   . Hypertension Sister   . Hypertension Sister      Social History: Social History   Socioeconomic History  . Marital status: Widowed    Spouse name: Not on file  . Number of children: 4  . Years of education: Not on file  . Highest education level: Not on file  Occupational History  . Not on file  Tobacco Use  . Smoking status: Never Smoker  . Smokeless tobacco: Never Used  Vaping Use  . Vaping Use: Never used  Substance and Sexual Activity  . Alcohol use: Never  . Drug use: Never  . Sexual activity: Not on file  Other Topics Concern  . Not on file  Social History Narrative  . Not on file   Social  Determinants of Health   Financial Resource Strain: Not on file  Food Insecurity: Not on file  Transportation Needs: Not on file  Physical Activity: Not on file  Stress: Not on file  Social Connections: Not on file  Intimate Partner Violence: Not on file     Medications Prior to Admission  Medication Sig Dispense Refill Last Dose  . aspirin EC 81 MG tablet Take 1 tablet (81 mg total) by mouth daily. Swallow whole. 90 tablet 3 05/10/2021 at 0800  . bumetanide (BUMEX) 1 MG tablet Take 1 tablet by mouth twice daily  (Patient taking differently: Take 1 mg by mouth 2 (two) times daily.) 60 tablet 6 05/10/2021 at Unknown time  . cholecalciferol (VITAMIN D) 25 MCG (1000 UNIT) tablet Take 3,000 Units by mouth daily.   05/10/2021 at Unknown time  . colchicine 0.6 MG tablet Take 0.5 tablets (0.3 mg total) by mouth daily. 15 tablet 0 05/10/2021 at Unknown time  . diclofenac Sodium (VOLTAREN) 1 % GEL Apply 2 g topically 4 (four) times daily.   Past Month at Unknown time  . ELIQUIS 2.5 MG TABS tablet Take 1 tablet by mouth twice daily (Patient taking differently: Take 2.5 mg by mouth 2 (two) times daily.) 60 tablet 6 05/10/2021 at 0800  . gabapentin (NEURONTIN) 100 MG capsule Take 200 mg by mouth at bedtime.   05/09/2021 at Unknown time  . hydrALAZINE (APRESOLINE) 50 MG tablet Take 1.5 tablets (75 mg total) by mouth 3 (three) times daily. 45 tablet 3 05/10/2021 at Unknown time  . isosorbide dinitrate (ISORDIL) 30 MG tablet Take 1.5 tablets (45 mg total) by mouth 3 (three) times daily. 45 tablet 3 05/10/2021 at 1200  . losartan (COZAAR) 25 MG tablet Take 1 tablet (25 mg total) by mouth daily. 90 tablet 0 05/10/2021 at Unknown time  . methocarbamol (ROBAXIN) 500 MG tablet Take 1 tablet (500 mg total) by mouth every 6 (six) hours as needed for muscle spasms. 30 tablet 0 Past Week at Unknown time  . metoprolol succinate (TOPROL-XL) 50 MG 24 hr tablet Take 50 mg by mouth daily.   05/10/2021 at 0800  . Misc. Devices MISC Please dispense one power stair lift for at home daily use prn debility. 1 Device 0   . nitroGLYCERIN (NITROSTAT) 0.4 MG SL tablet Place 1 tablet (0.4 mg total) under the tongue every 5 (five) minutes as needed for chest pain. 30 tablet 0 PRN  . pantoprazole (PROTONIX) 40 MG tablet Take 1 tablet (40 mg total) by mouth daily. 30 tablet 0 05/10/2021 at Unknown time  . rosuvastatin (CRESTOR) 20 MG tablet Take 1 tablet (20 mg total) by mouth at bedtime. 90 tablet 1 05/09/2021 at Unknown time  . traZODone (DESYREL) 50 MG tablet Take 1  tablet by mouth at bedtime.   05/09/2021 at Unknown time    Review of Systems  Constitutional: Negative for decreased appetite, malaise/fatigue, weight gain and weight loss.  HENT: Negative for congestion.   Eyes: Negative for visual disturbance.  Cardiovascular: Positive for chest pain and dyspnea on exertion. Negative for leg swelling, palpitations and syncope.  Respiratory: Negative for cough.   Endocrine: Negative for cold intolerance.  Hematologic/Lymphatic: Does not bruise/bleed easily.  Skin: Negative for itching and rash.  Musculoskeletal: Negative for myalgias.  Gastrointestinal: Negative for abdominal pain, nausea and vomiting.  Genitourinary: Negative for dysuria.  Neurological: Positive for numbness (In both feet). Negative for dizziness and weakness.       Head heaviness  Psychiatric/Behavioral: The patient is not nervous/anxious.   All other systems reviewed and are negative.     Physical Exam: Physical Exam Vitals and nursing note reviewed.  Constitutional:      General: She is not in acute distress.    Appearance: She is well-developed.  HENT:     Head: Normocephalic and atraumatic.  Eyes:     Conjunctiva/sclera: Conjunctivae normal.     Pupils: Pupils are equal, round, and reactive to light.  Neck:     Vascular: No JVD.  Cardiovascular:     Rate and Rhythm: Normal rate and regular rhythm.     Pulses: Intact distal pulses.          Dorsalis pedis pulses are 2+ on the right side and 1+ on the left side.       Posterior tibial pulses are 0 on the right side and 0 on the left side.     Heart sounds: No murmur heard.   Pulmonary:     Effort: Pulmonary effort is normal.     Breath sounds: Normal breath sounds. No wheezing or rales.  Abdominal:     General: Bowel sounds are normal.     Palpations: Abdomen is soft.     Tenderness: There is no rebound.  Musculoskeletal:        General: No tenderness. Normal range of motion.     Right lower leg: No edema.      Left lower leg: No edema.  Lymphadenopathy:     Cervical: No cervical adenopathy.  Skin:    General: Skin is warm and dry.  Neurological:     Mental Status: She is alert and oriented to person, place, and time.     Cranial Nerves: No cranial nerve deficit.      Labs:   Lab Results  Component Value Date   WBC 8.0 05/10/2021   HGB 10.5 (L) 05/10/2021   HCT 34.0 (L) 05/10/2021   MCV 98.0 05/10/2021   PLT 166 05/10/2021    Recent Labs  Lab 05/10/21 1919  NA 142  K 3.9  CL 107  CO2 27  BUN 23  CREATININE 1.98*  CALCIUM 9.0  GLUCOSE 214*    Lipid Panel     Component Value Date/Time   CHOL 130 08/29/2020 0250   TRIG 89 08/29/2020 0250   HDL 47 08/29/2020 0250   CHOLHDL 2.8 08/29/2020 0250   VLDL 18 08/29/2020 0250   LDLCALC 65 08/29/2020 0250    BNP (last 3 results) Recent Labs    08/26/20 0505 08/27/20 0818 05/11/21 0036  BNP 675.7* 424.0* 598.8*    HEMOGLOBIN A1C Lab Results  Component Value Date   HGBA1C 6.7 (H) 08/27/2020   MPG 145.59 08/27/2020    Cardiac Panel (last 3 results) Recent Labs    08/18/20 0200  CKTOTAL 14    Lab Results  Component Value Date   CKTOTAL 54 08/18/2020     TSH Recent Labs    08/22/20 1759  TSH 1.054      Radiology: CT Chest Wo Contrast  Result Date: 05/10/2021 CLINICAL DATA:  Chest pain, back pain EXAM: CT CHEST WITHOUT CONTRAST TECHNIQUE: Multidetector CT imaging of the chest was performed following the standard protocol without IV contrast. COMPARISON:  None. FINDINGS: Cardiovascular: Heart is normal size. Moderate coronary artery and aortic calcifications. No evidence of aortic aneurysm. Mediastinum/Nodes: No mediastinal, hilar, or axillary adenopathy. Trachea and esophagus are unremarkable. Thyroid unremarkable. Lungs/Pleura: Linear areas of scarring in the  lungs bilaterally. No confluent opacities or effusions. Upper Abdomen: Imaging into the upper abdomen demonstrates no acute findings. Musculoskeletal:  Chest wall soft tissues are unremarkable. No acute bony abnormality. IMPRESSION: No evidence of aortic aneurysm. No acute cardiopulmonary disease. Aortic Atherosclerosis (ICD10-I70.0). Electronically Signed   By: Rolm Baptise M.D.   On: 05/10/2021 21:32   DG Chest Port 1 View  Result Date: 05/10/2021 CLINICAL DATA:  Chest pain EXAM: PORTABLE CHEST 1 VIEW COMPARISON:  03/16/2021 FINDINGS: Mildly elevated right hemidiaphragm. Mild enlargement of the cardiopericardial silhouette, with Kerley B lines and a peripheral interstitial accentuation favoring interstitial pulmonary edema. No overt airspace edema is currently identified. Atherosclerotic calcification of the aortic arch. No blunting of the costophrenic angles. Thoracic spondylosis. IMPRESSION: 1. Mild cardiomegaly with interstitial edema and Kerley B lines. 2. Chronic elevation of the right hemidiaphragm. 3.  Aortic Atherosclerosis (ICD10-I70.0). Electronically Signed   By: Van Clines M.D.   On: 05/10/2021 19:50    Scheduled Meds: . aspirin EC  81 mg Oral Daily  . bumetanide  1 mg Oral BID  . furosemide  40 mg Intravenous Once  . hydrALAZINE  75 mg Oral Q8H  . isosorbide dinitrate  45 mg Oral TID  . losartan  25 mg Oral Daily  . metoprolol succinate  50 mg Oral Daily  . rosuvastatin  20 mg Oral QHS  . traZODone  50 mg Oral QHS   Continuous Infusions: . heparin     PRN Meds:.acetaminophen, ondansetron (ZOFRAN) IV  CARDIAC STUDIES:  EKG 05/11/2021: Sinus rhythm LVH Old anteroseptal infarct  Echocardiogram 12/15/2020: 1. The distal inferoseptum and apex appear mildly hypokinetic, but WMA is  much improved compared with prior study. LVEF has improved considerably.  Left ventricular ejection fraction, by estimation, is 55 to 60%. Left  ventricular ejection fraction by 3D  volume is 56 %. The left ventricle has normal function. The left ventricle  demonstrates regional wall motion abnormalities (see scoring  diagram/findings for  description). Left ventricular diastolic parameters  are consistent with Grade I diastolic  dysfunction (impaired relaxation).  2. Right ventricular systolic function is normal. The right ventricular  size is normal. There is normal pulmonary artery systolic pressure. The  estimated right ventricular systolic pressure is 99991111 mmHg.  3. The mitral valve is grossly normal. Trivial mitral valve  regurgitation. No evidence of mitral stenosis.  4. The aortic valve is tricuspid. There is mild calcification of the  aortic valve. There is mild thickening of the aortic valve. Aortic valve  regurgitation is not visualized. Mild aortic valve sclerosis is present,  with no evidence of aortic valve  stenosis.  5. The inferior vena cava is normal in size with greater than 50%  respiratory variability, suggesting right atrial pressure of 3 mmHg.   Left and right heart catheterization 08/23/2020: RA 13/15, mean 12, RA saturation 56%; RV 51/8, EDP 12 mmHg; PA 54/26, mean 38 mmHg, PA saturation 45%; PW 21/21, mean 20 mmHg, aortic saturation 97%. Cardiac output 4.96, cardiac index 2.51.  SVR elevated at 1258.  PVR mildly elevated at 290.  QP/QS 0.79.  Left main: Mild disease. LAD: Proximal 80% stenosis followed by 100% occlusion.  Before the occlusion, at the subtotal site, there is a large diagonal that comes off and has secondary branches.  Apical LAD is barely visualized with minimal amount of collaterals.  Thrombus is evident at the site of occlusion. Circumflex: Large vessel, moderate-sized OM1 and moderate-sized OM 2.  Minimal disease. RCA: Ostial 80 to  90% stenosis.  Mild disease in the remaining of the vessel, PL branch has a 50% stenosis of the proximal and ostial segment.  Severe dampening was evident with engagement of 5 French diagnostic catheter. LV: EDP mildly elevated at 15- 18 mmHg.  No pressure gradient across the aortic valve.  LVEF 30 to 35% with entire anterior, anterolateral, apical and  apical inferior akinesis.  No significant MR.  Impression: Patient's presentation is consistent with acute systolic heart failure from recent acute myocardial infarction.  Patient is postop spine surgery and not a candidate for anticoagulation with full dose or with antiplatelet therapy for now in view of risk of development of spine hematoma and paraparesis.  Also the infarct appears to have been completed.  As her EDP is only 15 and wedge although shows mild elevation at 20, she is saturating well and heart rate is much better improved and controlled with regard to atrial fibrillation.  Hence I did not proceed with implantation of intra-aortic balloon pump.  Will be aggressive with fluid management on the floor and watch her carefully.  Have a low threshold for intubation if necessary.  I suspect she probably will recover as it has been greater than 24 hours since her infarct.   Assessment & Recommendations:  80 y.o. African-American female  with coronary artery disease (proximal LAD occlusion, ostial RCA critical stenosis), history of post op MI after spinal surgery (08/2020), along with stroke-medically treated, CKD stage IV, type 2 diabetes mellitus, paroxysmal atrial fibrillation, admitted with chest pain.  Chest pain: Two-vessel CAD.  No chest pain at rest with troponin elevation suggestive of non-STEMI. Recommend IV heparin and IV nitroglycerin.  Hold Eliquis.  Continue aspirin. Discussed the risks and benefits of invasive work-up with patient and her daughter.  It is evident that she is more symptomatic from her CAD, now having non-STEMI.  In addition to continue medical therapy, she needs repeat invasive evaluation for decision-making of revascularization.  This obviously needs to be weighed with risks of contrast-induced nephropathy given her underlying CKD stage IV.  Her creatinine is now around 2, which is lower compared to 10 days ago, around 3.1.  I think this is our best window of  opportunity to repeat coronary angiogram.  I recommend staging intervention, whether surgical or percutaneous, on another day.  Recommend IV hydration.  Appreciate nephrology consult.  I discussed the risks of contrast-induced nephropathy and risk of dialysis of about 2% with patient and daughter.  Both are in agreement that patient proceed with diagnostic angiogram today.  I will use femoral access in order to minimize contrast use, as well as to avoid jeopardizing her radial arteries, in case she needs dialysis access in the future. Continue metoprolol, statin.  Hold losartan.  Headache: Constant headedness.  History of stroke.  Consider CT head to rule out any acute pathology.     Nigel Mormon, MD Pager: [phone] Office: [phone]

## 2021-05-12 ENCOUNTER — Inpatient Hospital Stay (HOSPITAL_COMMUNITY): Payer: Medicare HMO

## 2021-05-12 DIAGNOSIS — I214 Non-ST elevation (NSTEMI) myocardial infarction: Principal | ICD-10-CM

## 2021-05-12 DIAGNOSIS — I5020 Unspecified systolic (congestive) heart failure: Secondary | ICD-10-CM

## 2021-05-12 LAB — URINALYSIS, ROUTINE W REFLEX MICROSCOPIC
Bilirubin Urine: NEGATIVE
Glucose, UA: NEGATIVE mg/dL
Hgb urine dipstick: NEGATIVE
Ketones, ur: NEGATIVE mg/dL
Leukocytes,Ua: NEGATIVE
Nitrite: NEGATIVE
Protein, ur: NEGATIVE mg/dL
Specific Gravity, Urine: 1.01 (ref 1.005–1.030)
pH: 6 (ref 5.0–8.0)

## 2021-05-12 LAB — RENAL FUNCTION PANEL
Albumin: 3.1 g/dL — ABNORMAL LOW (ref 3.5–5.0)
Anion gap: 11 (ref 5–15)
BUN: 19 mg/dL (ref 8–23)
CO2: 25 mmol/L (ref 22–32)
Calcium: 8.9 mg/dL (ref 8.9–10.3)
Chloride: 103 mmol/L (ref 98–111)
Creatinine, Ser: 1.9 mg/dL — ABNORMAL HIGH (ref 0.44–1.00)
GFR, Estimated: 26 mL/min — ABNORMAL LOW (ref >60)
Glucose, Bld: 136 mg/dL — ABNORMAL HIGH (ref 70–99)
Phosphorus: 3.5 mg/dL (ref 2.5–4.6)
Potassium: 4 mmol/L (ref 3.5–5.1)
Sodium: 139 mmol/L (ref 135–145)

## 2021-05-12 LAB — CBC
HCT: 30.2 % — ABNORMAL LOW (ref 36.0–46.0)
Hemoglobin: 9.7 g/dL — ABNORMAL LOW (ref 12.0–15.0)
MCH: 30.7 pg (ref 26.0–34.0)
MCHC: 32.1 g/dL (ref 30.0–36.0)
MCV: 95.6 fL (ref 80.0–100.0)
Platelets: 138 10*3/uL — ABNORMAL LOW (ref 150–400)
RBC: 3.16 MIL/uL — ABNORMAL LOW (ref 3.87–5.11)
RDW: 13.6 % (ref 11.5–15.5)
WBC: 10.3 10*3/uL (ref 4.0–10.5)
nRBC: 0 % (ref 0.0–0.2)

## 2021-05-12 LAB — APTT
aPTT: 43 seconds — ABNORMAL HIGH (ref 24–36)
aPTT: 78 seconds — ABNORMAL HIGH (ref 24–36)

## 2021-05-12 LAB — HEPARIN LEVEL (UNFRACTIONATED): Heparin Unfractionated: 0.63 IU/mL (ref 0.30–0.70)

## 2021-05-12 LAB — HEMOGLOBIN A1C
Hgb A1c MFr Bld: 6.1 % — ABNORMAL HIGH (ref 4.8–5.6)
Mean Plasma Glucose: 128 mg/dL

## 2021-05-12 MED ORDER — FUROSEMIDE 10 MG/ML IJ SOLN
40.0000 mg | Freq: Two times a day (BID) | INTRAMUSCULAR | Status: DC
  Administered 2021-05-12 – 2021-05-13 (×2): 40 mg via INTRAVENOUS
  Filled 2021-05-12 (×2): qty 4

## 2021-05-12 MED ORDER — SODIUM CHLORIDE 0.9 % IV SOLN
510.0000 mg | INTRAVENOUS | Status: AC
  Administered 2021-05-12 – 2021-05-15 (×2): 510 mg via INTRAVENOUS
  Filled 2021-05-12 (×2): qty 17

## 2021-05-12 NOTE — Progress Notes (Signed)
PROGRESS NOTE    Kristin [name]  [id number] DOB: 09/13/1941 DOA: 05/10/2021 PCP: Caren Macadam, MD    Chief Complaint  Patient presents with  . Chest Pain    Brief Narrative:  Kristin [name] a 80 y.o.femalewith medical history significant forCAD, A-fib, HFpEF, CKD 4, HTN who presents for evaluation of chest pain.She reports over the last 2 days she has been experiencing chest pain that goes across her entire chest region and will radiate around to her back. She states that she had an a heart attack in September 2021 and she was concerned of having another heart attack. She was started on IV heparin and IV nitro. Cardiology consulted and she is underwent cardiac catheterization on 05/11/21, showing two vessel disease. Plan for scheduled PCI next week.   Assessment & Plan:   Principal Problem:   Chest pain Active Problems:   Hypertension   Non-ST elevation (NSTEMI) myocardial infarction (HCC)   Elevated troponin   CKD (chronic kidney disease) stage 4, GFR 15-29 ml/min (HCC)   HFrEF (heart failure with reduced ejection fraction) (HCC)   NSTEMI (non-ST elevated myocardial infarction) ([facility])   NSTEMI Patient is currently on IV heparin, continue with aspirin, metoprolol 50 mg daily, nitroglycerin gtt., Crestor 20 mg daily.  She underwent cardiac catheterization showing two-vessel disease.  She is scheduled for PCI in the next week.   Heaviness of the head with some dizziness persistent since last September Visual CT of the head is negative for acute stroke. Due to persistent symptoms we will follow-up with an MRI of the brain at this time.   Essential hypertension Blood pressure parameters are improving    Stage IV CKD Continue to monitor creatinine.  Next    Acute on Chronic diastolic heart failure/ with presevered LVEF.  IV diuresis with Lasix 40 mg twice daily Continue with bidil.    Stroke in September 2021 Patient reports persistent heaviness of the  head, MRI of the brain ordered for further evaluation.    DVT prophylaxis: IV heparin code Status: full code.  Family Communication: none at bedside.  Disposition:   Status is: Inpatient  Remains inpatient appropriate because:Ongoing diagnostic testing needed not appropriate for outpatient work up, Unsafe d/c plan and IV treatments appropriate due to intensity of illness or inability to take PO   Dispo: The patient is from: Home              Anticipated d/c is to: pending              Patient currently is not medically stable to d/c.   Difficult to place patient No       Consultants:   Cardiology  Nephrology.    Procedures: cardiac catheterization    Antimicrobials: none.    Subjective: Still complaining of heaviness in the head. Slight dizziness. Sob on ambulation. No chest pain.   Objective: Vitals:   05/11/21 1823 05/11/21 2000 05/12/21 0224 05/12/21 1000  BP:  (!) 162/96 (!) 151/86   Pulse: (!) 103 (!) 112 (!) 108   Resp: '19 20 20   '$ Temp: 98 F (36.7 C) 100.3 F (37.9 C) 99.8 F (37.7 C)   TempSrc: Oral Oral Oral   SpO2: 92% 95% 97%   Weight:    93.4 kg  Height:    '5\' 4"'$  (1.626 m)    Intake/Output Summary (Last 24 hours) at 05/12/2021 1242 Last data filed at 05/12/2021 0545 Gross per 24 hour  Intake 146.54 ml  Output --  Net 146.54 ml   Filed Weights   05/10/21 1915 05/11/21 0450 05/12/21 1000  Weight: 92.5 kg 95.4 kg 93.4 kg    Examination:  General exam: Appears calm and comfortable  Respiratory system: Clear to auscultation. Respiratory effort normal. Cardiovascular system: S1 & S2 heard, tachycardic. Marland Kitchen No JVD,  No pedal edema. Gastrointestinal system: Abdomen is nondistended, soft and nontender.Normal bowel sounds heard. Central nervous system: Alert and oriented. No focal neurological deficits. Extremities: Symmetric 5 x 5 power. Skin: No rashes, lesions or ulcers Psychiatry:  Mood & affect appropriate.     Data Reviewed: I have  personally reviewed following labs and imaging studies  CBC: Recent Labs  Lab 05/10/21 1919 05/12/21 0250  WBC 8.0 10.3  HGB 10.5* 9.7*  HCT 34.0* 30.2*  MCV 98.0 95.6  PLT 166 138*    Basic Metabolic Panel: Recent Labs  Lab 05/10/21 1919 05/11/21 1004 05/11/21 1818 05/12/21 0250  NA 142 141 138 139  K 3.9 4.9 4.3 4.0  CL 107 110 103 103  CO2 '27 26 25 25  '$ GLUCOSE 214* 129* 114* 136*  BUN '23 21 18 19  '$ CREATININE 1.98* 2.04* 1.85* 1.90*  CALCIUM 9.0 9.1 9.0 8.9  PHOS  --   --   --  3.5    GFR: Estimated Creatinine Clearance: 26.2 mL/min (A) (by C-G formula based on SCr of 1.9 mg/dL (H)).  Liver Function Tests: Recent Labs  Lab 05/12/21 0250  ALBUMIN 3.1*    CBG: No results for input(s): GLUCAP in the last 168 hours.   Recent Results (from the past 240 hour(s))  Resp Panel by RT-PCR (Flu A&B, Covid) Nasopharyngeal Swab     Status: None   Collection Time: 05/11/21 12:36 AM   Specimen: Nasopharyngeal Swab; Nasopharyngeal(NP) swabs in vial transport medium  Result Value Ref Range Status   SARS Coronavirus 2 by RT PCR NEGATIVE NEGATIVE Final    Comment: (NOTE) SARS-CoV-2 target nucleic acids are NOT DETECTED.  The SARS-CoV-2 RNA is generally detectable in upper respiratory specimens during the acute phase of infection. The lowest concentration of SARS-CoV-2 viral copies this assay can detect is 138 copies/mL. A negative result does not preclude SARS-Cov-2 infection and should not be used as the sole basis for treatment or other patient management decisions. A negative result may occur with  improper specimen collection/handling, submission of specimen other than nasopharyngeal swab, presence of viral mutation(s) within the areas targeted by this assay, and inadequate number of viral copies(<138 copies/mL). A negative result must be combined with clinical observations, patient history, and epidemiological information. The expected result is Negative.  Fact  Sheet for Patients:  EntrepreneurPulse.com.au  Fact Sheet for Healthcare Providers:  IncredibleEmployment.be  This test is no t yet approved or cleared by the [place] FDA and  has been authorized for detection and/or diagnosis of SARS-CoV-2 by FDA under an Emergency Use Authorization (EUA). This EUA will remain  in effect (meaning this test can be used) for the duration of the COVID-19 declaration under Section 564(b)(1) of the Act, 21 U.S.C.section 360bbb-3(b)(1), unless the authorization is terminated  or revoked sooner.       Influenza A by PCR NEGATIVE NEGATIVE Final   Influenza B by PCR NEGATIVE NEGATIVE Final    Comment: (NOTE) The Xpert Xpress SARS-CoV-2/FLU/RSV plus assay is intended as an aid in the diagnosis of influenza from Nasopharyngeal swab specimens and should not be used as a sole basis for treatment. Nasal washings and aspirates are unacceptable  for Xpert Xpress SARS-CoV-2/FLU/RSV testing.  Fact Sheet for Patients: EntrepreneurPulse.com.au  Fact Sheet for Healthcare Providers: IncredibleEmployment.be  This test is not yet approved or cleared by the [place] FDA and has been authorized for detection and/or diagnosis of SARS-CoV-2 by FDA under an Emergency Use Authorization (EUA). This EUA will remain in effect (meaning this test can be used) for the duration of the COVID-19 declaration under Section 564(b)(1) of the Act, 21 U.S.C. section 360bbb-3(b)(1), unless the authorization is terminated or revoked.  Performed at [facility], [place] [address], [place], [place] [address]          Radiology Studies: CT HEAD WO CONTRAST  Result Date: 05/11/2021 CLINICAL DATA:  Dizziness with nonspecific headache.  Slight nausea. EXAM: CT HEAD WITHOUT CONTRAST TECHNIQUE: Contiguous axial images were obtained from the base of the skull through the vertex without intravenous  contrast. COMPARISON:  CT head 08/27/2020.  MRI head 08/28/2020. FINDINGS: Brain: There is no evidence of acute intracranial hemorrhage, mass lesion, brain edema or extra-axial fluid collection. There is mild atrophy with mild prominence of the ventricles and subarachnoid spaces. Interval evolution of previously demonstrated infarcts in the right parietal lobe and left superior cerebellum. There is no CT evidence of acute cortical infarction. Patchy small vessel ischemic changes throughout the periventricular white matter. Vascular: Intracranial vascular calcifications. No hyperdense vessel identified. Skull: Negative for fracture or focal lesion. Sinuses/Orbits: The visualized paranasal sinuses and mastoid air cells are clear. No orbital abnormalities are seen. Other: Previous bilateral lens surgery. IMPRESSION: 1. No evidence of acute intracranial process. 2. 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 No CT evidence of acute infarct. Electronically Signed   By: Richardean Sale M.D.   On: 05/11/2021 11:33   CT Chest Wo Contrast  Result Date: 05/10/2021 CLINICAL DATA:  Chest pain, back pain EXAM: CT CHEST WITHOUT CONTRAST TECHNIQUE: Multidetector CT imaging of the chest was performed following the standard protocol without IV contrast. COMPARISON:  None. FINDINGS: Cardiovascular: Heart is normal size. Moderate coronary artery and aortic calcifications. No evidence of aortic aneurysm. Mediastinum/Nodes: No mediastinal, hilar, or axillary adenopathy. Trachea and esophagus are unremarkable. Thyroid unremarkable. Lungs/Pleura: Linear areas of scarring in the lungs bilaterally. No confluent opacities or effusions. Upper Abdomen: Imaging into the upper abdomen demonstrates no acute findings. Musculoskeletal: Chest wall soft tissues are unremarkable. No acute bony abnormality. IMPRESSION: No evidence of aortic aneurysm. No acute cardiopulmonary disease. Aortic  Atherosclerosis (ICD10-I70.0). Electronically Signed   By: Rolm Baptise M.D.   On: 05/10/2021 21:32   CARDIAC CATHETERIZATION  Result Date: 05/11/2021 LM: Normal LAD: Prox 60% stenosis        Mid 95% stenosis just prox to large bifurcating diag        Diag ostium does not seem to be involved.        (Prior cath in 08/2020 noted prox 80% stenosis, followed by long mid to apical LAD occlusion, which seems to have recanalized) LCx: No significant disease RCA: Ostial 90% stenosis         (Significant dampening of pressure noted)         RPL 60% stenosis RCA and LCx system seems largely unchanged compared to 08/2020 Two vessel CAD (Prox-mid LAD and ostial RCA) Elevated LVEDP 29 mmHg Given acute HFrEF, recommend staged revascularization. Options include CABG (Grafts to LAD, Diag, and RCA) vs PCI to LAD and RCA. Recommend medical management of heart failure over the weekend, followed  by revascularization. Nigel Mormon, MD Pager: [phone] Office: [phone]  DG Chest Port 1 View  Result Date: 05/10/2021 CLINICAL DATA:  Chest pain EXAM: PORTABLE CHEST 1 VIEW COMPARISON:  03/16/2021 FINDINGS: Mildly elevated right hemidiaphragm. Mild enlargement of the cardiopericardial silhouette, with Kerley B lines and a peripheral interstitial accentuation favoring interstitial pulmonary edema. No overt airspace edema is currently identified. Atherosclerotic calcification of the aortic arch. No blunting of the costophrenic angles. Thoracic spondylosis. IMPRESSION: 1. Mild cardiomegaly with interstitial edema and Kerley B lines. 2. Chronic elevation of the right hemidiaphragm. 3.  Aortic Atherosclerosis (ICD10-I70.0). Electronically Signed   By: Van Clines M.D.   On: 05/10/2021 19:50   ECHOCARDIOGRAM COMPLETE  Result Date: 05/11/2021    ECHOCARDIOGRAM REPORT   Patient Name:   AVIANA ZIERKE Date of Exam: 05/11/2021 Medical Rec #:  [id number]        Height:       64.0 in Accession #:    [id number]       Weight:        210.3 lb Date of Birth:  [birth year]         BSA:          1.999 m Patient Age:    3 years         BP:           143/93 mmHg Patient Gender: F                HR:           75 bpm. Exam Location:  Inpatient Procedure: 2D Echo, Cardiac Doppler, Color Doppler and Intracardiac            Opacification Agent Indications:     R07.9* Chest pain, unspecified  History:         Patient has prior history of Echocardiogram examinations, most                  recent 12/15/2020. CAD; Risk Factors:Hypertension.  Sonographer:     Merrie Roof RDCS Referring Phys:  [id number] [facility] J PATWARDHAN Diagnosing Phys: Vernell Leep MD IMPRESSIONS  1. Left ventricular ejection fraction, by estimation, is 45 to 50%. The left ventricle has normal function. The left ventricle demonstrates regional wall motion abnormalities (see scoring diagram/findings for description). Left ventricular diastolic function could not be evaluated. There is akinesis of the left ventricular, mid-apical anterior wall and anteroseptal wall. There is akinesis of the left ventricular, apical segment.  2. Right ventricular systolic function is normal. The right ventricular size is normal.  3. Left atrial size was severely dilated.  4. The mitral valve is grossly normal. Mild mitral valve regurgitation.  5. The aortic valve is tricuspid. Aortic valve regurgitation is not visualized.  6. The inferior vena cava is normal in size with <50% respiratory variability, suggesting right atrial pressure of 8 mmHg.  7. Compared to previous study on 12/15/2020, LVEF is marginally lower from 50-55%. Wall motion abnormalities are more pronounced. FINDINGS  Left Ventricle: Inadequate Doppler data to evaluate diastolic function. Left ventricular ejection fraction, by estimation, is 45 to 50%. The left ventricle has normal function. The left ventricle demonstrates regional wall motion abnormalities. Definity  contrast agent was given IV to delineate the left ventricular endocardial  borders. The left ventricular internal cavity size was normal in size. There is no left ventricular hypertrophy. Left ventricular diastolic function could not be evaluated. Right Ventricle: The right ventricular size is normal. No increase in right ventricular  wall thickness. Right ventricular systolic function is normal. Left Atrium: Left atrial size was severely dilated. Right Atrium: Right atrial size was normal in size. Pericardium: There is no evidence of pericardial effusion. Mitral Valve: The mitral valve is grossly normal. Mild mitral valve regurgitation. Tricuspid Valve: The tricuspid valve is grossly normal. Tricuspid valve regurgitation is mild. Aortic Valve: The aortic valve is tricuspid. Aortic valve regurgitation is not visualized. Aortic valve mean gradient measures 4.0 mmHg. Aortic valve peak gradient measures 7.8 mmHg. Aortic valve area, by VTI measures 2.23 cm. Pulmonic Valve: The pulmonic valve was grossly normal. Pulmonic valve regurgitation is mild. Aorta: The aortic root and ascending aorta are structurally normal, with no evidence of dilitation. Venous: The inferior vena cava is normal in size with less than 50% respiratory variability, suggesting right atrial pressure of 8 mmHg. IAS/Shunts: The interatrial septum is aneurysmal. No atrial level shunt detected by color flow Doppler.  LEFT VENTRICLE PLAX 2D LVIDd:         4.70 cm LVIDs:         3.50 cm LV PW:         1.00 cm LV IVS:        1.00 cm LVOT diam:     2.00 cm LV SV:         61 LV SV Index:   30 LVOT Area:     3.14 cm  LV Volumes (MOD) LV vol d, MOD A2C: 115.0 ml LV vol d, MOD A4C: 107.0 ml LV vol s, MOD A2C: 75.4 ml LV vol s, MOD A4C: 54.9 ml LV SV MOD A2C:     39.6 ml LV SV MOD A4C:     107.0 ml LV SV MOD BP:      46.4 ml RIGHT VENTRICLE          IVC RV Basal diam:  3.30 cm  IVC diam: 2.30 cm LEFT ATRIUM              Index       RIGHT ATRIUM           Index LA diam:        4.00 cm  2.00 cm/m  RA Area:     15.10 cm LA Vol (A2C):    120.0 ml 60.04 ml/m RA Volume:   41.00 ml  20.51 ml/m LA Vol (A4C):   87.6 ml  43.83 ml/m LA Biplane Vol: 102.0 ml 51.04 ml/m  AORTIC VALVE AV Area (Vmax):    2.24 cm AV Area (Vmean):   2.09 cm AV Area (VTI):     2.23 cm AV Vmax:           140.00 cm/s AV Vmean:          93.600 cm/s AV VTI:            0.273 m AV Peak Grad:      7.8 mmHg AV Mean Grad:      4.0 mmHg LVOT Vmax:         99.80 cm/s LVOT Vmean:        62.300 cm/s LVOT VTI:          0.194 m LVOT/AV VTI ratio: 0.71  AORTA Ao Root diam: 3.20 cm  SHUNTS Systemic VTI:  0.19 m Systemic Diam: 2.00 cm Vernell Leep MD Electronically signed by Vernell Leep MD Signature Date/Time: 05/11/2021/12:56:05 PM    Final         Scheduled Meds: . aspirin EC  81  mg Oral Daily  . furosemide  40 mg Intravenous BID  . isosorbide-hydrALAZINE  1 tablet Oral TID  . metoprolol succinate  50 mg Oral Daily  . rosuvastatin  20 mg Oral QHS  . sodium chloride flush  3 mL Intravenous Q12H  . traZODone  50 mg Oral QHS   Continuous Infusions: . sodium chloride    . ferumoxytol    . heparin 950 Units/hr (05/12/21 0545)  . nitroGLYCERIN 30 mcg/min (05/12/21 0545)     LOS: 1 day        Hosie Poisson, MD [facility]   To contact the attending provider between 7A-7P or the covering provider during after hours 7P-7A, please log into the web site [email or web address] and access using universal [place] password for that web site. If you do not have the password, please call the hospital operator.  05/12/2021, 12:42 PM

## 2021-05-12 NOTE — Progress Notes (Signed)
Subjective:  Breathing well. Still has mild chest pain.  Objective:  Vital Signs in the last 24 hours: Temp:  [98 F (36.7 C)-100.3 F (37.9 C)] 99.8 F (37.7 C) (06/04 0224) Pulse Rate:  [77-133] 108 (06/04 0224) Resp:  [0-44] 20 (06/04 0224) BP: (142-179)/(82-114) 151/86 (06/04 0224) SpO2:  [85 %-99 %] 97 % (06/04 0224)  Intake/Output from previous day: 06/03 0701 - 06/04 0700 In: 146.5 [I.V.:146.5] Out: -   Physical Exam Vitals and nursing note reviewed.  Constitutional:      General: She is not in acute distress.    Appearance: She is well-developed.  HENT:     Head: Normocephalic and atraumatic.  Eyes:     Conjunctiva/sclera: Conjunctivae normal.     Pupils: Pupils are equal, round, and reactive to light.  Neck:     Vascular: No JVD.  Cardiovascular:     Rate and Rhythm: Normal rate and regular rhythm.     Pulses: Normal pulses and intact distal pulses.     Heart sounds: No murmur heard.   Pulmonary:     Effort: Pulmonary effort is normal.     Breath sounds: Rales present. No wheezing.  Abdominal:     General: Bowel sounds are normal.     Palpations: Abdomen is soft.     Tenderness: There is no rebound.  Musculoskeletal:        General: No tenderness. Normal range of motion.     Right lower leg: No edema.     Left lower leg: No edema.  Lymphadenopathy:     Cervical: No cervical adenopathy.  Skin:    General: Skin is warm and dry.  Neurological:     Mental Status: She is alert and oriented to person, place, and time.     Cranial Nerves: No cranial nerve deficit.      Lab Results: BMP Recent Labs    10/27/20 1059 01/05/21 0917 01/26/21 1040 04/13/21 1010 05/11/21 1004 05/11/21 1818 05/12/21 0250  NA 142 145* 144   < > 141 138 139  K 4.2 4.1 4.3   < > 4.9 4.3 4.0  CL 101 99 98   < > 110 103 103  CO2 '26 29 28   '$ < > '26 25 25  '$ GLUCOSE 105* 86 80   < > 129* 114* 136*  BUN 27 48* 41*   < > '21 18 19  '$ CREATININE 1.89* 2.40* 2.22*   < > 2.04*  1.85* 1.90*  CALCIUM 9.8 9.8 9.9   < > 9.1 9.0 8.9  GFRNONAA 25* 19* 20*   < > 24* 27* 26*  GFRAA 28* 21* 23*  --   --   --   --    < > = values in this interval not displayed.    CBC Recent Labs  Lab 05/12/21 0250  WBC 10.3  RBC 3.16*  HGB 9.7*  HCT 30.2*  PLT 138*  MCV 95.6  MCH 30.7  MCHC 32.1  RDW 13.6    HEMOGLOBIN A1C Lab Results  Component Value Date   HGBA1C 6.1 (H) 05/11/2021   MPG 128 05/11/2021    Cardiac Panel (last 3 results) Results for NICOLETA, DEMMITT (MRN [id number]) as of 05/12/2021 12:10  Ref. Range 05/10/2021 19:19 05/10/2021 21:19 05/11/2021 04:38 05/11/2021 10:04  Troponin I (High Sensitivity) Latest Ref Range: <18 ng/L 75 (H) 88 (H) 131 (HH) 171 (HH)   BNP (last 3 results) Recent Labs    08/26/20 0505 08/27/20 0818 05/11/21  0036  BNP 675.7* 424.0* 598.8*    TSH Recent Labs    08/22/20 1759  TSH 1.054    Lipid Panel     Component Value Date/Time   CHOL 130 08/29/2020 0250   TRIG 89 08/29/2020 0250   HDL 47 08/29/2020 0250   CHOLHDL 2.8 08/29/2020 0250   VLDL 18 08/29/2020 0250   LDLCALC 65 08/29/2020 0250     Hepatic Function Panel Recent Labs    08/22/20 1759 08/26/20 2304 09/25/20 1951 05/12/21 0250  PROT 7.2 6.5 6.6  --   ALBUMIN 2.8* 2.5* 3.5 3.1*  AST 190* 50* 20  --   ALT 66* 45* 12  --   ALKPHOS 79 53 55  --   BILITOT 0.5 0.9 0.4  --   BILIDIR  --  0.2  --   --   IBILI  --  0.7  --   --     Imaging: Chest x-ray 05/10/2021: 1. Mild cardiomegaly with interstitial edema and Kerley B lines. 2. Chronic elevation of the right hemidiaphragm. 3.  Aortic Atherosclerosis (ICD10-I70.0).   Cardiac Studies:  EKG 05/12/2021: Sinus rhythm 100 bpm Left atrial enlargement Left ventricular hypertrophy Old anteroseptal infarct  Coronary angiography 05/11/2021: LM: Normal LAD: Prox 60% stenosis        Mid 95% stenosis just prox to large bifurcating diag        Diag ostium does not seem to be involved.        (Prior cath in  08/2020 noted prox 80% stenosis, followed by long mid to apical LAD occlusion, which seems to have recanalized) LCx: No significant disease RCA: Ostial 90% stenosis         (Significant dampening of pressure noted)         RPL 60% stenosis RCA and LCx system seems largely unchanged compared to 08/2020  Two vessel CAD (Prox-mid LAD and ostial RCA) Elevated LVEDP 29 mmHg  Given acute HFrEF, recommend staged revascularization. Options include CABG (Grafts to LAD, Diag, and RCA) vs PCI to LAD and RCA. Recommend medical management of heart failure over the weekend, followed by revascularization.   Echocardiogram 05/11/2021: 1. Left ventricular ejection fraction, by estimation, is 45 to 50%. The  left ventricle has normal function. The left ventricle demonstrates  regional wall motion abnormalities (see scoring diagram/findings for  description). Left ventricular diastolic  function could not be evaluated. There is akinesis of the left  ventricular, mid-apical anterior wall and anteroseptal wall. There is  akinesis of the left ventricular, apical segment.  2. Right ventricular systolic function is normal. The right ventricular  size is normal.  3. Left atrial size was severely dilated.  4. The mitral valve is grossly normal. Mild mitral valve regurgitation.  5. The aortic valve is tricuspid. Aortic valve regurgitation is not  visualized.  6. The inferior vena cava is normal in size with <50% respiratory  variability, suggesting right atrial pressure of 8 mmHg.  7. Compared to previous study on 12/15/2020, LVEF is marginally lower from  50-55%. Wall motion abnormalities are more pronounced.   Assessment & Recommendations:  80 y.o. African-American female  with coronary artery disease (proximal LAD occlusion, ostial RCA critical stenosis), history of post op MI after spinal surgery (08/2020), along with stroke-medically treated, CKD stage IV, type 2 diabetes mellitus, paroxysmal atrial  fibrillation, admitted with NSTEMI, acute on chronic HFrEF  NSTEMI: Chest pain improved with IV nitroglycerin.  High-sensitivity troponin 75--->171 Two-vessel CAD with critical mid LAD and  ostial RCA stenoses. Given her recurrent heart failure and non-STEMI presentation, recommend revascularization however medical therapy alone. Discussed options of CABG versus PCI.  Presented the following data to the patient.   STS short term risk calculator:       CIR risk with PCI: (Using Mehran score) Risk of Mortality: 8.810%                     CIN risk: 26%  Renal Failure: 17.538%                       risk of dialysis: 1.09% Permanent Stroke: 5.529%  Prolonged Ventilation: 24.192%            DSW Infection: 0.262% Reoperation: 2.640% Morbidity or Mortality: 39.211% Short Length of Stay: 8.193% Long Length of Stay: 26.387%  Syntax score I: 8 (low complexity) Syntax score II: PCI: 58: 4 year mortality risk: 54% CABG: 4 year mortality risk: 29% Syntax recommendation: CABG or PCI  Overall, I quoted better long-term prognosis with CABG, but better short-term prognosis with PCI.  Perioperative/periprocedural concerns are highest regarding her stroke risk and renal failure risk.  I also offered surgical evaluation.  Patient wants to think about it, but seems to be leaning towards PCI.  We will follow-up again tomorrow.  In the meantime, continue aspirin, heparin, statin, metoprolol.  Acute on chronic HFrEF: Secondary to ischemic cardiomyopathy. LVEDP 29 on diagnostic angiogram 05/11/2021. Recommend Lasix 40 mg twice daily. Added BiDil 20-37.5 mg twice daily. Continue metoprolol succinate 50 mg daily. Not on ACE/ARB/Arni due to renal dysfunction.  CKD stage IV: Creatinine improved and stabilized compared to May 2022. Agree with holding losartan. Appreciate input from nephrologist Dr. Posey Pronto.  History of stroke: Cerebellar stroke in 08/2020. Patient reportedly had an episode of left-sided  facial numbness a week ago, but denies any other recent acute stroke signs or symptoms. While awaiting revascularization decision, it is reasonable to proceed with further imaging as clinically indicated. Brain MRI pending.  About recommendations discussed with patient and daughter at bedside, as well as primary attending Dr. Karleen Hampshire, and nephrologist Dr. Posey Pronto.   Nigel Mormon, MD Pager: [phone] Office: [phone]

## 2021-05-12 NOTE — Progress Notes (Signed)
Patient ID: Kainani Leal, female   DOB: [birth year], 80 y.o.   MRN: [id number] [facility] Progress Note   Assessment/ Plan:   1.  Chronic kidney disease stage IV: Suspected to be from underlying hypertensive nephrosclerosis with stepwise reduction following acute kidney injury during prolonged hospitalization last year; database suggestive of possible cholesterol emboli versus dense ATN.    Renal function stable overnight after coronary angiography yesterday (contrast volume unknown).  Unfortunately without urine output charted and does not have weight from this morning.  I had discontinued her ARB/diuretic to limit additional factors contributing to contrast nephropathy and postprocedure she was restarted on intravenous furosemide 40 mg daily for management of CHF; would be comfortable with increasing this up to 40 mg twice a day if volume unloading not satisfactory overnight. 2.  Non-ST elevation MI:  Underwent coronary angiography yesterday that showed 60% proximal LAD stenosis with mid LAD 95% stenosis as well as ostial RCA stenosis.  Plans noted for staged revascularization (unclear if these will be additional percutaneous interventions or CABG).  3.  Hypertension: Blood pressure elevated on nitroglycerin drip, restarted back on conservatively dosed furosemide 40 mg IV daily. 4.  Anemia: With significant iron deficiency noted on labs, will prescribe intravenous iron.  Subjective:   No acute events overnight after coronary angiography yesterday afternoon   Objective:   BP (!) 151/86 (BP Location: Right Arm)   Pulse (!) 108   Temp 99.8 F (37.7 C) (Oral)   Resp 20   Ht '5\' 4"'$  (1.626 m)   Wt 95.4 kg   SpO2 97%   BMI 36.10 kg/m   Intake/Output Summary (Last 24 hours) at 05/12/2021 1020 Last data filed at 05/12/2021 0545 Gross per 24 hour  Intake 146.54 ml  Output --  Net 146.54 ml   Weight change:   Physical Exam: Gen: Comfortably resting in bed CVS: Pulse regular  tachycardia, S1 and S2 normal without murmur/gallop Resp: Fine rales left base otherwise clear to auscultation Abd: Soft, obese, nontender, bowel sounds normal Ext: No lower extremity edema  Imaging: CT HEAD WO CONTRAST  Result Date: 05/11/2021 CLINICAL DATA:  Dizziness with nonspecific headache.  Slight nausea. EXAM: CT HEAD WITHOUT CONTRAST TECHNIQUE: Contiguous axial images were obtained from the base of the skull through the vertex without intravenous contrast. COMPARISON:  CT head 08/27/2020.  MRI head 08/28/2020. FINDINGS: Brain: There is no evidence of acute intracranial hemorrhage, mass lesion, brain edema or extra-axial fluid collection. There is mild atrophy with mild prominence of the ventricles and subarachnoid spaces. Interval evolution of previously demonstrated infarcts in the right parietal lobe and left superior cerebellum. There is no CT evidence of acute cortical infarction. Patchy small vessel ischemic changes throughout the periventricular white matter. Vascular: Intracranial vascular calcifications. No hyperdense vessel identified. Skull: Negative for fracture or focal lesion. Sinuses/Orbits: The visualized paranasal sinuses and mastoid air cells are clear. No orbital abnormalities are seen. Other: Previous bilateral lens surgery. IMPRESSION: 1. No evidence of acute intracranial process. 2. 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 No CT evidence of acute infarct. Electronically Signed   By: Richardean Sale M.D.   On: 05/11/2021 11:33   CT Chest Wo Contrast  Result Date: 05/10/2021 CLINICAL DATA:  Chest pain, back pain EXAM: CT CHEST WITHOUT CONTRAST TECHNIQUE: Multidetector CT imaging of the chest was performed following the standard protocol without IV contrast. COMPARISON:  None. FINDINGS: Cardiovascular: Heart is normal size. Moderate coronary artery and  aortic calcifications. No evidence of aortic aneurysm.  Mediastinum/Nodes: No mediastinal, hilar, or axillary adenopathy. Trachea and esophagus are unremarkable. Thyroid unremarkable. Lungs/Pleura: Linear areas of scarring in the lungs bilaterally. No confluent opacities or effusions. Upper Abdomen: Imaging into the upper abdomen demonstrates no acute findings. Musculoskeletal: Chest wall soft tissues are unremarkable. No acute bony abnormality. IMPRESSION: No evidence of aortic aneurysm. No acute cardiopulmonary disease. Aortic Atherosclerosis (ICD10-I70.0). Electronically Signed   By: Rolm Baptise M.D.   On: 05/10/2021 21:32   CARDIAC CATHETERIZATION  Result Date: 05/11/2021 LM: Normal LAD: Prox 60% stenosis        Mid 95% stenosis just prox to large bifurcating diag        Diag ostium does not seem to be involved.        (Prior cath in 08/2020 noted prox 80% stenosis, followed by long mid to apical LAD occlusion, which seems to have recanalized) LCx: No significant disease RCA: Ostial 90% stenosis         (Significant dampening of pressure noted)         RPL 60% stenosis RCA and LCx system seems largely unchanged compared to 08/2020 Two vessel CAD (Prox-mid LAD and ostial RCA) Elevated LVEDP 29 mmHg Given acute HFrEF, recommend staged revascularization. Options include CABG (Grafts to LAD, Diag, and RCA) vs PCI to LAD and RCA. Recommend medical management of heart failure over the weekend, followed by revascularization. Nigel Mormon, MD Pager: [phone] Office: [phone]  DG Chest Port 1 View  Result Date: 05/10/2021 CLINICAL DATA:  Chest pain EXAM: PORTABLE CHEST 1 VIEW COMPARISON:  03/16/2021 FINDINGS: Mildly elevated right hemidiaphragm. Mild enlargement of the cardiopericardial silhouette, with Kerley B lines and a peripheral interstitial accentuation favoring interstitial pulmonary edema. No overt airspace edema is currently identified. Atherosclerotic calcification of the aortic arch. No blunting of the costophrenic angles. Thoracic spondylosis.  IMPRESSION: 1. Mild cardiomegaly with interstitial edema and Kerley B lines. 2. Chronic elevation of the right hemidiaphragm. 3.  Aortic Atherosclerosis (ICD10-I70.0). Electronically Signed   By: Van Clines M.D.   On: 05/10/2021 19:50   ECHOCARDIOGRAM COMPLETE  Result Date: 05/11/2021    ECHOCARDIOGRAM REPORT   Patient Name:   CHRISHAWNA MILLIKIN Date of Exam: 05/11/2021 Medical Rec #:  [id number]        Height:       64.0 in Accession #:    [id number]       Weight:       210.3 lb Date of Birth:  Mar 08, 1941         BSA:          1.999 m Patient Age:    17 years         BP:           143/93 mmHg Patient Gender: F                HR:           75 bpm. Exam Location:  Inpatient Procedure: 2D Echo, Cardiac Doppler, Color Doppler and Intracardiac            Opacification Agent Indications:     R07.9* Chest pain, unspecified  History:         Patient has prior history of Echocardiogram examinations, most                  recent 12/15/2020. CAD; Risk Factors:Hypertension.  Sonographer:     Merrie Roof RDCS Referring Phys:  [id number] [facility] J  [place] Diagnosing Phys: Vernell Leep MD IMPRESSIONS  1. Left ventricular ejection fraction, by estimation, is 45 to 50%. The left ventricle has normal function. The left ventricle demonstrates regional wall motion abnormalities (see scoring diagram/findings for description). Left ventricular diastolic function could not be evaluated. There is akinesis of the left ventricular, mid-apical anterior wall and anteroseptal wall. There is akinesis of the left ventricular, apical segment.  2. Right ventricular systolic function is normal. The right ventricular size is normal.  3. Left atrial size was severely dilated.  4. The mitral valve is grossly normal. Mild mitral valve regurgitation.  5. The aortic valve is tricuspid. Aortic valve regurgitation is not visualized.  6. The inferior vena cava is normal in size with <50% respiratory variability, suggesting right atrial pressure of 8  mmHg.  7. Compared to previous study on 12/15/2020, LVEF is marginally lower from 50-55%. Wall motion abnormalities are more pronounced. FINDINGS  Left Ventricle: Inadequate Doppler data to evaluate diastolic function. Left ventricular ejection fraction, by estimation, is 45 to 50%. The left ventricle has normal function. The left ventricle demonstrates regional wall motion abnormalities. Definity  contrast agent was given IV to delineate the left ventricular endocardial borders. The left ventricular internal cavity size was normal in size. There is no left ventricular hypertrophy. Left ventricular diastolic function could not be evaluated. Right Ventricle: The right ventricular size is normal. No increase in right ventricular wall thickness. Right ventricular systolic function is normal. Left Atrium: Left atrial size was severely dilated. Right Atrium: Right atrial size was normal in size. Pericardium: There is no evidence of pericardial effusion. Mitral Valve: The mitral valve is grossly normal. Mild mitral valve regurgitation. Tricuspid Valve: The tricuspid valve is grossly normal. Tricuspid valve regurgitation is mild. Aortic Valve: The aortic valve is tricuspid. Aortic valve regurgitation is not visualized. Aortic valve mean gradient measures 4.0 mmHg. Aortic valve peak gradient measures 7.8 mmHg. Aortic valve area, by VTI measures 2.23 cm. Pulmonic Valve: The pulmonic valve was grossly normal. Pulmonic valve regurgitation is mild. Aorta: The aortic root and ascending aorta are structurally normal, with no evidence of dilitation. Venous: The inferior vena cava is normal in size with less than 50% respiratory variability, suggesting right atrial pressure of 8 mmHg. IAS/Shunts: The interatrial septum is aneurysmal. No atrial level shunt detected by color flow Doppler.  LEFT VENTRICLE PLAX 2D LVIDd:         4.70 cm LVIDs:         3.50 cm LV PW:         1.00 cm LV IVS:        1.00 cm LVOT diam:     2.00 cm LV SV:          61 LV SV Index:   30 LVOT Area:     3.14 cm  LV Volumes (MOD) LV vol d, MOD A2C: 115.0 ml LV vol d, MOD A4C: 107.0 ml LV vol s, MOD A2C: 75.4 ml LV vol s, MOD A4C: 54.9 ml LV SV MOD A2C:     39.6 ml LV SV MOD A4C:     107.0 ml LV SV MOD BP:      46.4 ml RIGHT VENTRICLE          IVC RV Basal diam:  3.30 cm  IVC diam: 2.30 cm LEFT ATRIUM              Index       RIGHT ATRIUM  Index LA diam:        4.00 cm  2.00 cm/m  RA Area:     15.10 cm LA Vol (A2C):   120.0 ml 60.04 ml/m RA Volume:   41.00 ml  20.51 ml/m LA Vol (A4C):   87.6 ml  43.83 ml/m LA Biplane Vol: 102.0 ml 51.04 ml/m  AORTIC VALVE AV Area (Vmax):    2.24 cm AV Area (Vmean):   2.09 cm AV Area (VTI):     2.23 cm AV Vmax:           140.00 cm/s AV Vmean:          93.600 cm/s AV VTI:            0.273 m AV Peak Grad:      7.8 mmHg AV Mean Grad:      4.0 mmHg LVOT Vmax:         99.80 cm/s LVOT Vmean:        62.300 cm/s LVOT VTI:          0.194 m LVOT/AV VTI ratio: 0.71  AORTA Ao Root diam: 3.20 cm  SHUNTS Systemic VTI:  0.19 m Systemic Diam: 2.00 cm Vernell Leep MD Electronically signed by Vernell Leep MD Signature Date/Time: 05/11/2021/12:56:05 PM    Final     Labs: BMET Recent Labs  Lab 05/10/21 1919 05/11/21 1004 05/11/21 1818 05/12/21 0250  NA 142 141 138 139  K 3.9 4.9 4.3 4.0  CL 107 110 103 103  CO2 '27 26 25 25  '$ GLUCOSE 214* 129* 114* 136*  BUN '23 21 18 19  '$ CREATININE 1.98* 2.04* 1.85* 1.90*  CALCIUM 9.0 9.1 9.0 8.9  PHOS  --   --   --  3.5   CBC Recent Labs  Lab 05/10/21 1919 05/12/21 0250  WBC 8.0 10.3  HGB 10.5* 9.7*  HCT 34.0* 30.2*  MCV 98.0 95.6  PLT 166 138*   Medications:    . aspirin EC  81 mg Oral Daily  . furosemide  40 mg Intravenous Daily  . isosorbide-hydrALAZINE  1 tablet Oral TID  . metoprolol succinate  50 mg Oral Daily  . rosuvastatin  20 mg Oral QHS  . sodium chloride flush  3 mL Intravenous Q12H  . traZODone  50 mg Oral QHS   Elmarie Shiley, MD 05/12/2021, 10:20 AM

## 2021-05-12 NOTE — Progress Notes (Signed)
ANTICOAGULATION CONSULT NOTE  Pharmacy Consult for heparin Indication: chest pain/ACS and atrial fibrillation  No Known Allergies  Patient Measurements: Height: '5\' 4"'$  (162.6 cm) Weight: 93.4 kg (205 lb 14.6 oz) IBW/kg (Calculated) : 54.7 Heparin Dosing Weight:77kg  Vital Signs: Temp: 99.8 F (37.7 C) (06/04 0224) Temp Source: Oral (06/04 0224) BP: 151/86 (06/04 0224) Pulse Rate: 108 (06/04 0224)  Labs: Recent Labs    05/10/21 1919 05/10/21 2119 05/11/21 0438 05/11/21 1004 05/11/21 1818 05/12/21 0250 05/12/21 1107  HGB 10.5*  --   --   --   --  9.7*  --   HCT 34.0*  --   --   --   --  30.2*  --   PLT 166  --   --   --   --  138*  --   APTT  --   --   --   --   --   --  43*  HEPARINUNFRC  --   --   --   --   --   --  0.63  CREATININE 1.98*  --   --  2.04* 1.85* 1.90*  --   TROPONINIHS 75* 88* 131* 171*  --   --   --     Estimated Creatinine Clearance: 26.2 mL/min (A) (by C-G formula based on SCr of 1.9 mg/dL (H)).   Medical History: Past Medical History:  Diagnosis Date  . Atrial fibrillation ([place])   . CHF (congestive heart failure) ([place])   . Coronary artery disease   . Diabetes mellitus without complication ([place])   . Hypercholesteremia   . Hypertension   . Renal disorder    CKD Stage IV  . Stroke [facility])     Assessment: 80 year old female with history of afib on apixaban. Appears her last dose of medication was yesterday 6/2 at 0800. New orders to transition to heparin for possible ACS given tropinin bump. Will monitor both aPTT and heparin levels since recent DOAC likely to falsely elevate heparin levels.   Pt s/p cath with 2vCAD 6/3, to restart heparin while deciding on revascularization plans - CABG vs PCI.  APTT subtherapeutic at 43 (not yet correlating with heparin level). No bleeding or issues with infusion per RN.   Goal of Therapy:  Heparin level 0.3-0.7 units/ml aPTT 66-102 seconds Monitor platelets by anticoagulation protocol: Yes   Plan:   Increase heparin to 1250 units/hr Check 8h aPTT Daily aPTT, heparin level, CBC F/u revascularization plan  Rebbeca Paul, PharmD PGY1 Pharmacy Resident 05/12/2021 1:09 PM  Please check [email or web address] for unit-specific pharmacy phone numbers.

## 2021-05-12 NOTE — Progress Notes (Signed)
[place] for heparin Indication: chest pain/ACS and atrial fibrillation  No Known Allergies  Patient Measurements: Height: '5\' 4"'$  (162.6 cm) Weight: 93.4 kg (205 lb 14.6 oz) IBW/kg (Calculated) : 54.7 Heparin Dosing Weight:77kg  Vital Signs: Temp: 98.9 F (37.2 C) (06/04 1923) Temp Source: Oral (06/04 1923) BP: 128/73 (06/04 1923) Pulse Rate: 87 (06/04 1923)  Labs: Recent Labs    05/10/21 1919 05/10/21 2119 05/11/21 0438 05/11/21 1004 05/11/21 1818 05/12/21 0250 05/12/21 1107 05/12/21 2247  HGB 10.5*  --   --   --   --  9.7*  --   --   HCT 34.0*  --   --   --   --  30.2*  --   --   PLT 166  --   --   --   --  138*  --   --   APTT  --   --   --   --   --   --  43* 78*  HEPARINUNFRC  --   --   --   --   --   --  0.63  --   CREATININE 1.98*  --   --  2.04* 1.85* 1.90*  --   --   TROPONINIHS 75* 88* 131* 171*  --   --   --   --     Estimated Creatinine Clearance: 26.2 mL/min (A) (by C-G formula based on SCr of 1.9 mg/dL (H)).   Assessment: 80 y.o. female with h/o Afib, Eliquis on hold, for heparin   Goal of Therapy:  Heparin level 0.3-0.7 units/ml aPTT 66-102 seconds Monitor platelets by anticoagulation protocol: Yes   Plan:  Continue Heparin at current rate   Phillis Knack, PharmD, BCPS .

## 2021-05-13 LAB — RENAL FUNCTION PANEL
Albumin: 2.8 g/dL — ABNORMAL LOW (ref 3.5–5.0)
Anion gap: 5 (ref 5–15)
BUN: 24 mg/dL — ABNORMAL HIGH (ref 8–23)
CO2: 28 mmol/L (ref 22–32)
Calcium: 8.9 mg/dL (ref 8.9–10.3)
Chloride: 106 mmol/L (ref 98–111)
Creatinine, Ser: 1.85 mg/dL — ABNORMAL HIGH (ref 0.44–1.00)
GFR, Estimated: 27 mL/min — ABNORMAL LOW (ref >60)
Glucose, Bld: 114 mg/dL — ABNORMAL HIGH (ref 70–99)
Phosphorus: 3.3 mg/dL (ref 2.5–4.6)
Potassium: 3.8 mmol/L (ref 3.5–5.1)
Sodium: 139 mmol/L (ref 135–145)

## 2021-05-13 LAB — CBC
HCT: 30.2 % — ABNORMAL LOW (ref 36.0–46.0)
Hemoglobin: 9.6 g/dL — ABNORMAL LOW (ref 12.0–15.0)
MCH: 30.3 pg (ref 26.0–34.0)
MCHC: 31.8 g/dL (ref 30.0–36.0)
MCV: 95.3 fL (ref 80.0–100.0)
Platelets: 126 10*3/uL — ABNORMAL LOW (ref 150–400)
RBC: 3.17 MIL/uL — ABNORMAL LOW (ref 3.87–5.11)
RDW: 13.5 % (ref 11.5–15.5)
WBC: 6.7 10*3/uL (ref 4.0–10.5)
nRBC: 0 % (ref 0.0–0.2)

## 2021-05-13 LAB — HEPARIN LEVEL (UNFRACTIONATED): Heparin Unfractionated: 0.55 IU/mL (ref 0.30–0.70)

## 2021-05-13 LAB — APTT: aPTT: 73 seconds — ABNORMAL HIGH (ref 24–36)

## 2021-05-13 MED ORDER — FUROSEMIDE 10 MG/ML IJ SOLN
20.0000 mg | Freq: Once | INTRAMUSCULAR | Status: AC
  Administered 2021-05-13: 20 mg via INTRAVENOUS
  Filled 2021-05-13: qty 2

## 2021-05-13 MED ORDER — FUROSEMIDE 10 MG/ML IJ SOLN
60.0000 mg | Freq: Two times a day (BID) | INTRAMUSCULAR | Status: DC
  Administered 2021-05-13: 60 mg via INTRAVENOUS
  Filled 2021-05-13 (×2): qty 6

## 2021-05-13 NOTE — Progress Notes (Signed)
Patient ID: Kristin [name], female   DOB: [birth year], 80 y.o.   MRN: [id number] [facility] Progress Note   Assessment/ Plan:   1.  Chronic kidney disease stage IV: Suspected to be from underlying hypertensive nephrosclerosis with stepwise reduction following acute kidney injury during prolonged hospitalization last year; database suggestive of possible cholesterol emboli versus dense ATN.    Renal function has remained stable about 48 hours following coronary angiogram and she is currently getting diuresis for efforts at volume unloading prior to staged revascularization procedure versus CABG.  Although net negative from fluid standpoint, appears that weight is unchanged.  Increase furosemide to 60 mg IV twice daily and recheck labs again tomorrow. 2.  Non-ST elevation MI:  Underwent coronary angiography yesterday that showed 60% proximal LAD stenosis with mid LAD 95% stenosis as well as ostial RCA stenosis.  Plans noted for staged revascularization  3.  Hypertension: Blood pressure elevated on nitroglycerin drip, will increase furosemide to 60 mg IV twice daily 4.  Anemia: With significant iron deficiency noted on labs-started on intravenous iron.  Subjective:   She denies any acute events overnight and specifically denies shortness of breath or worsening chest pain   Objective:   BP 131/70   Pulse 77   Temp (!) 97.2 F (36.2 C) (Oral)   Resp 17   Ht '5\' 4"'$  (1.626 m)   Wt 93.8 kg   SpO2 93%   BMI 35.50 kg/m   Intake/Output Summary (Last 24 hours) at 05/13/2021 [id number] Last data filed at 05/13/2021 [id number] Gross per 24 hour  Intake 544.04 ml  Output 900 ml  Net -355.96 ml   Weight change:   Physical Exam: Gen: Comfortably resting in bed CVS: Pulse regular tachycardia, S1 and S2 normal without murmur/gallop Resp: Lung fields clear to auscultation bilaterally without rales or rhonchi Abd: Soft, obese, nontender, bowel sounds normal Ext: Trace lower extremity  edema  Imaging: CT HEAD WO CONTRAST  Result Date: 05/11/2021 CLINICAL DATA:  Dizziness with nonspecific headache.  Slight nausea. EXAM: CT HEAD WITHOUT CONTRAST TECHNIQUE: Contiguous axial images were obtained from the base of the skull through the vertex without intravenous contrast. COMPARISON:  CT head 08/27/2020.  MRI head 08/28/2020. FINDINGS: Brain: There is no evidence of acute intracranial hemorrhage, mass lesion, brain edema or extra-axial fluid collection. There is mild atrophy with mild prominence of the ventricles and subarachnoid spaces. Interval evolution of previously demonstrated infarcts in the right parietal lobe and left superior cerebellum. There is no CT evidence of acute cortical infarction. Patchy small vessel ischemic changes throughout the periventricular white matter. Vascular: Intracranial vascular calcifications. No hyperdense vessel identified. Skull: Negative for fracture or focal lesion. Sinuses/Orbits: The visualized paranasal sinuses and mastoid air cells are clear. No orbital abnormalities are seen. Other: Previous bilateral lens surgery. IMPRESSION: 1. No evidence of acute intracranial process. 2. 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 No CT evidence of acute infarct. Electronically Signed   By: Richardean Sale M.D.   On: 05/11/2021 11:33   MR BRAIN WO CONTRAST  Result Date: 05/13/2021 CLINICAL DATA:  Dizziness EXAM: MRI HEAD WITHOUT CONTRAST TECHNIQUE: Multiplanar, multiecho pulse sequences of the brain and surrounding structures were obtained without intravenous contrast. COMPARISON:  None. FINDINGS: Brain: No acute infarct, mass effect or extra-axial collection. Widespread magnetic susceptibility effect suggests prior iron infusion. There is multifocal hyperintense T2-weighted signal within the white matter. Parenchymal volume and CSF spaces are normal. Old right parietal  and left cerebellar infarcts. No acute  hemorrhage. The midline structures are normal. Vascular: Major flow voids are preserved. Skull and upper cervical spine: Normal calvarium and skull base. Visualized upper cervical spine and soft tissues are normal. Sinuses/Orbits:No paranasal sinus fluid levels or advanced mucosal thickening. No mastoid or middle ear effusion. Normal orbits. IMPRESSION: 1. No acute intracranial abnormality. 2. Old right parietal and left cerebellar infarcts. Electronically Signed   By: Ulyses Jarred M.D.   On: 05/13/2021 00:12   CARDIAC CATHETERIZATION  Result Date: 05/11/2021 LM: Normal LAD: Prox 60% stenosis        Mid 95% stenosis just prox to large bifurcating diag        Diag ostium does not seem to be involved.        (Prior cath in 08/2020 noted prox 80% stenosis, followed by long mid to apical LAD occlusion, which seems to have recanalized) LCx: No significant disease RCA: Ostial 90% stenosis         (Significant dampening of pressure noted)         RPL 60% stenosis RCA and LCx system seems largely unchanged compared to 08/2020 Two vessel CAD (Prox-mid LAD and ostial RCA) Elevated LVEDP 29 mmHg Given acute HFrEF, recommend staged revascularization. Options include CABG (Grafts to LAD, Diag, and RCA) vs PCI to LAD and RCA. Recommend medical management of heart failure over the weekend, followed by revascularization. Nigel Mormon, MD Pager: [phone] Office: [phone]  ECHOCARDIOGRAM COMPLETE  Result Date: 05/11/2021    ECHOCARDIOGRAM REPORT   Patient Name:   Kristin [name] Date of Exam: 05/11/2021 Medical Rec #:  [id number]        Height:       64.0 in Accession #:    [id number]       Weight:       210.3 lb Date of Birth:  1941-02-16         BSA:          1.999 m Patient Age:    70 years         BP:           143/93 mmHg Patient Gender: F                HR:           75 bpm. Exam Location:  Inpatient Procedure: 2D Echo, Cardiac Doppler, Color Doppler and Intracardiac            Opacification Agent Indications:      R07.9* Chest pain, unspecified  History:         Patient has prior history of Echocardiogram examinations, most                  recent 12/15/2020. CAD; Risk Factors:Hypertension.  Sonographer:     Merrie Roof RDCS Referring Phys:  [id number] [facility] J PATWARDHAN Diagnosing Phys: Vernell Leep MD IMPRESSIONS  1. Left ventricular ejection fraction, by estimation, is 45 to 50%. The left ventricle has normal function. The left ventricle demonstrates regional wall motion abnormalities (see scoring diagram/findings for description). Left ventricular diastolic function could not be evaluated. There is akinesis of the left ventricular, mid-apical anterior wall and anteroseptal wall. There is akinesis of the left ventricular, apical segment.  2. Right ventricular systolic function is normal. The right ventricular size is normal.  3. Left atrial size was severely dilated.  4. The mitral valve is grossly normal. Mild mitral valve regurgitation.  5. The aortic valve is  tricuspid. Aortic valve regurgitation is not visualized.  6. The inferior vena cava is normal in size with <50% respiratory variability, suggesting right atrial pressure of 8 mmHg.  7. Compared to previous study on 12/15/2020, LVEF is marginally lower from 50-55%. Wall motion abnormalities are more pronounced. FINDINGS  Left Ventricle: Inadequate Doppler data to evaluate diastolic function. Left ventricular ejection fraction, by estimation, is 45 to 50%. The left ventricle has normal function. The left ventricle demonstrates regional wall motion abnormalities. Definity  contrast agent was given IV to delineate the left ventricular endocardial borders. The left ventricular internal cavity size was normal in size. There is no left ventricular hypertrophy. Left ventricular diastolic function could not be evaluated. Right Ventricle: The right ventricular size is normal. No increase in right ventricular wall thickness. Right ventricular systolic function is normal.  Left Atrium: Left atrial size was severely dilated. Right Atrium: Right atrial size was normal in size. Pericardium: There is no evidence of pericardial effusion. Mitral Valve: The mitral valve is grossly normal. Mild mitral valve regurgitation. Tricuspid Valve: The tricuspid valve is grossly normal. Tricuspid valve regurgitation is mild. Aortic Valve: The aortic valve is tricuspid. Aortic valve regurgitation is not visualized. Aortic valve mean gradient measures 4.0 mmHg. Aortic valve peak gradient measures 7.8 mmHg. Aortic valve area, by VTI measures 2.23 cm. Pulmonic Valve: The pulmonic valve was grossly normal. Pulmonic valve regurgitation is mild. Aorta: The aortic root and ascending aorta are structurally normal, with no evidence of dilitation. Venous: The inferior vena cava is normal in size with less than 50% respiratory variability, suggesting right atrial pressure of 8 mmHg. IAS/Shunts: The interatrial septum is aneurysmal. No atrial level shunt detected by color flow Doppler.  LEFT VENTRICLE PLAX 2D LVIDd:         4.70 cm LVIDs:         3.50 cm LV PW:         1.00 cm LV IVS:        1.00 cm LVOT diam:     2.00 cm LV SV:         61 LV SV Index:   30 LVOT Area:     3.14 cm  LV Volumes (MOD) LV vol d, MOD A2C: 115.0 ml LV vol d, MOD A4C: 107.0 ml LV vol s, MOD A2C: 75.4 ml LV vol s, MOD A4C: 54.9 ml LV SV MOD A2C:     39.6 ml LV SV MOD A4C:     107.0 ml LV SV MOD BP:      46.4 ml RIGHT VENTRICLE          IVC RV Basal diam:  3.30 cm  IVC diam: 2.30 cm LEFT ATRIUM              Index       RIGHT ATRIUM           Index LA diam:        4.00 cm  2.00 cm/m  RA Area:     15.10 cm LA Vol (A2C):   120.0 ml 60.04 ml/m RA Volume:   41.00 ml  20.51 ml/m LA Vol (A4C):   87.6 ml  43.83 ml/m LA Biplane Vol: 102.0 ml 51.04 ml/m  AORTIC VALVE AV Area (Vmax):    2.24 cm AV Area (Vmean):   2.09 cm AV Area (VTI):     2.23 cm AV Vmax:           140.00 cm/s AV Vmean:  93.600 cm/s AV VTI:            0.273 m AV  Peak Grad:      7.8 mmHg AV Mean Grad:      4.0 mmHg LVOT Vmax:         99.80 cm/s LVOT Vmean:        62.300 cm/s LVOT VTI:          0.194 m LVOT/AV VTI ratio: 0.71  AORTA Ao Root diam: 3.20 cm  SHUNTS Systemic VTI:  0.19 m Systemic Diam: 2.00 cm Vernell Leep MD Electronically signed by Vernell Leep MD Signature Date/Time: 05/11/2021/12:56:05 PM    Final     Labs: BMET Recent Labs  Lab 05/10/21 1919 05/11/21 1004 05/11/21 1818 05/12/21 0250 05/13/21 0340  NA 142 141 138 139 139  K 3.9 4.9 4.3 4.0 3.8  CL 107 110 103 103 106  CO2 '27 26 25 25 28  '$ GLUCOSE 214* 129* 114* 136* 114*  BUN '23 21 18 19 '$ 24*  CREATININE 1.98* 2.04* 1.85* 1.90* 1.85*  CALCIUM 9.0 9.1 9.0 8.9 8.9  PHOS  --   --   --  3.5 3.3   CBC Recent Labs  Lab 05/10/21 1919 05/12/21 0250 05/13/21 0340  WBC 8.0 10.3 6.7  HGB 10.5* 9.7* 9.6*  HCT 34.0* 30.2* 30.2*  MCV 98.0 95.6 95.3  PLT 166 138* 126*   Medications:    . aspirin EC  81 mg Oral Daily  . furosemide  40 mg Intravenous BID  . isosorbide-hydrALAZINE  1 tablet Oral TID  . metoprolol succinate  50 mg Oral Daily  . rosuvastatin  20 mg Oral QHS  . sodium chloride flush  3 mL Intravenous Q12H  . traZODone  50 mg Oral QHS   Elmarie Shiley, MD 05/13/2021, 9:16 AM

## 2021-05-13 NOTE — Progress Notes (Signed)
PROGRESS NOTE    Kristin [name]  [id number] DOB: [birth year] DOA: 05/10/2021 PCP: Caren Macadam, MD    Chief Complaint  Patient presents with  . Chest Pain    Brief Narrative:  Kristin [name] a 80 y.o.femalewith medical history significant forCAD, A-fib, HFpEF, CKD 4, HTN who presents for evaluation of chest pain.She reports over the last 2 days she has been experiencing chest pain that goes across her entire chest region and will radiate around to her back. She states that she had an a heart attack in September 2021 and she was concerned of having another heart attack. She was started on IV heparin and IV nitro. Cardiology consulted and she is underwent cardiac catheterization on 05/11/21, showing two vessel disease. Plan for staged revascularization next week with PCI.   Assessment & Plan:   Principal Problem:   Chest pain Active Problems:   Hypertension   Non-ST elevation (NSTEMI) myocardial infarction (HCC)   Elevated troponin   CKD (chronic kidney disease) stage 4, GFR 15-29 ml/min (HCC)   HFrEF (heart failure with reduced ejection fraction) (HCC)   NSTEMI (non-ST elevated myocardial infarction) (HCC)   NSTEMI No chest pain, Patient is currently on IV heparin and IV nitro gtt,  continue with aspirin, metoprolol 50 mg daily, Crestor 20 mg daily.  She underwent cardiac catheterization showing two-vessel disease.   She is scheduled for a staged revascularization /PCI in the next week.   Heaviness of the head with some dizziness persistent since last September/ fcial numbness/ loss of taste since last stroke.  Initial  CT of the head is negative for acute stroke. Due to persistent symptoms we will follow-up with an MRI of the brain at this time. MRI brain showed  Old right parietal stroke and left cerebellar infarcts.   Essential hypertension Blood pressure parameters are optimal.     Stage IV CKD Continue to monitor cr Creatinine stable around  1.85. Nephrology on board.     Acute on Chronic diastolic heart failure/ with presevered LVEF.  Decrease in th pre load with IV lasix 60  Mg BID.  Diuresing.  Continue with strike intake and output. Daily weights.  Continue with bidil.    Stroke in September 2021 Patient reports persistent heaviness of the head, MRI of the brain ordered for further evaluation, negative for acute stroke.     DVT prophylaxis: IV heparin code Status: full code.  Family Communication: none at bedside.  Disposition:   Status is: Inpatient  Remains inpatient appropriate because:Ongoing diagnostic testing needed not appropriate for outpatient work up, Unsafe d/c plan and IV treatments appropriate due to intensity of illness or inability to take PO   Dispo: The patient is from: Home              Anticipated d/c is to: pending              Patient currently is not medically stable to d/c.   Difficult to place patient No       Consultants:   Cardiology  Nephrology.    Procedures: cardiac catheterization    Antimicrobials: none.    Subjective: No new complaints.  Still heaviness in the heard.  Sob on ambulation.   Objective: Vitals:   05/12/21 1923 05/13/21 0016 05/13/21 0511 05/13/21 0820  BP: 128/73 (!) 154/94 (!) 155/83 131/70  Pulse: 87 89 76 77  Resp: '17 20 17   '$ Temp: 98.9 F (37.2 C) 98.4 F (36.9 C) (!) 97.2 F (36.2 C)  TempSrc: Oral Oral Oral   SpO2: 97% 99% 93%   Weight:   93.8 kg   Height:        Intake/Output Summary (Last 24 hours) at 05/13/2021 1500 Last data filed at 05/13/2021 0512 Gross per 24 hour  Intake 193.05 ml  Output 900 ml  Net -706.95 ml   Filed Weights   05/11/21 0450 05/12/21 1000 05/13/21 0511  Weight: 95.4 kg 93.4 kg 93.8 kg    Examination:  General exam: alert and comfortable.  Respiratory system: air entry fair, on RA no wheezing heard.  Cardiovascular system: S1 & S2 heard, RRR, no JVD,  Gastrointestinal system: Abdomen is soft,  NT ND BS+ Central nervous system: Alert and oriented, reports dizziness occasionally.  Extremities: no pedal edema Skin: no rahses seen.  Psychiatry:  Mood is appropriate.     Data Reviewed: I have personally reviewed following labs and imaging studies  CBC: Recent Labs  Lab 05/10/21 1919 05/12/21 0250 05/13/21 0340  WBC 8.0 10.3 6.7  HGB 10.5* 9.7* 9.6*  HCT 34.0* 30.2* 30.2*  MCV 98.0 95.6 95.3  PLT 166 138* 126*    Basic Metabolic Panel: Recent Labs  Lab 05/10/21 1919 05/11/21 1004 05/11/21 1818 05/12/21 0250 05/13/21 0340  NA 142 141 138 139 139  K 3.9 4.9 4.3 4.0 3.8  CL 107 110 103 103 106  CO2 '27 26 25 25 28  '$ GLUCOSE 214* 129* 114* 136* 114*  BUN '23 21 18 19 '$ 24*  CREATININE 1.98* 2.04* 1.85* 1.90* 1.85*  CALCIUM 9.0 9.1 9.0 8.9 8.9  PHOS  --   --   --  3.5 3.3    GFR: Estimated Creatinine Clearance: 26.9 mL/min (A) (by C-G formula based on SCr of 1.85 mg/dL (H)).  Liver Function Tests: Recent Labs  Lab 05/12/21 0250 05/13/21 0340  ALBUMIN 3.1* 2.8*    CBG: No results for input(s): GLUCAP in the last 168 hours.   Recent Results (from the past 240 hour(s))  Resp Panel by RT-PCR (Flu A&B, Covid) Nasopharyngeal Swab     Status: None   Collection Time: 05/11/21 12:36 AM   Specimen: Nasopharyngeal Swab; Nasopharyngeal(NP) swabs in vial transport medium  Result Value Ref Range Status   SARS Coronavirus 2 by RT PCR NEGATIVE NEGATIVE Final    Comment: (NOTE) SARS-CoV-2 target nucleic acids are NOT DETECTED.  The SARS-CoV-2 RNA is generally detectable in upper respiratory specimens during the acute phase of infection. The lowest concentration of SARS-CoV-2 viral copies this assay can detect is 138 copies/mL. A negative result does not preclude SARS-Cov-2 infection and should not be used as the sole basis for treatment or other patient management decisions. A negative result may occur with  improper specimen collection/handling, submission of  specimen other than nasopharyngeal swab, presence of viral mutation(s) within the areas targeted by this assay, and inadequate number of viral copies(<138 copies/mL). A negative result must be combined with clinical observations, patient history, and epidemiological information. The expected result is Negative.  Fact Sheet for Patients:  EntrepreneurPulse.com.au  Fact Sheet for Healthcare Providers:  IncredibleEmployment.be  This test is no t yet approved or cleared by the [place] FDA and  has been authorized for detection and/or diagnosis of SARS-CoV-2 by FDA under an Emergency Use Authorization (EUA). This EUA will remain  in effect (meaning this test can be used) for the duration of the COVID-19 declaration under Section 564(b)(1) of the Act, 21 U.S.C.section 360bbb-3(b)(1), unless the authorization is terminated  or  revoked sooner.       Influenza A by PCR NEGATIVE NEGATIVE Final   Influenza B by PCR NEGATIVE NEGATIVE Final    Comment: (NOTE) The Xpert Xpress SARS-CoV-2/FLU/RSV plus assay is intended as an aid in the diagnosis of influenza from Nasopharyngeal swab specimens and should not be used as a sole basis for treatment. Nasal washings and aspirates are unacceptable for Xpert Xpress SARS-CoV-2/FLU/RSV testing.  Fact Sheet for Patients: EntrepreneurPulse.com.au  Fact Sheet for Healthcare Providers: IncredibleEmployment.be  This test is not yet approved or cleared by the [place] FDA and has been authorized for detection and/or diagnosis of SARS-CoV-2 by FDA under an Emergency Use Authorization (EUA). This EUA will remain in effect (meaning this test can be used) for the duration of the COVID-19 declaration under Section 564(b)(1) of the Act, 21 U.S.C. section 360bbb-3(b)(1), unless the authorization is terminated or revoked.  Performed at [facility], [place] [facility] [address]  [place], [place] [address]          Radiology Studies: MR BRAIN WO CONTRAST  Result Date: 05/13/2021 CLINICAL DATA:  Dizziness EXAM: MRI HEAD WITHOUT CONTRAST TECHNIQUE: Multiplanar, multiecho pulse sequences of the brain and surrounding structures were obtained without intravenous contrast. COMPARISON:  None. FINDINGS: Brain: No acute infarct, mass effect or extra-axial collection. Widespread magnetic susceptibility effect suggests prior iron infusion. There is multifocal hyperintense T2-weighted signal within the white matter. Parenchymal volume and CSF spaces are normal. Old right parietal and left cerebellar infarcts. No acute hemorrhage. The midline structures are normal. Vascular: Major flow voids are preserved. Skull and upper cervical spine: Normal calvarium and skull base. Visualized upper cervical spine and soft tissues are normal. Sinuses/Orbits:No paranasal sinus fluid levels or advanced mucosal thickening. No mastoid or middle ear effusion. Normal orbits. IMPRESSION: 1. No acute intracranial abnormality. 2. Old right parietal and left cerebellar infarcts. Electronically Signed   By: Ulyses Jarred M.D.   On: 05/13/2021 00:12   CARDIAC CATHETERIZATION  Result Date: 05/11/2021 LM: Normal LAD: Prox 60% stenosis        Mid 95% stenosis just prox to large bifurcating diag        Diag ostium does not seem to be involved.        (Prior cath in 08/2020 noted prox 80% stenosis, followed by long mid to apical LAD occlusion, which seems to have recanalized) LCx: No significant disease RCA: Ostial 90% stenosis         (Significant dampening of pressure noted)         RPL 60% stenosis RCA and LCx system seems largely unchanged compared to 08/2020 Two vessel CAD (Prox-mid LAD and ostial RCA) Elevated LVEDP 29 mmHg Given acute HFrEF, recommend staged revascularization. Options include CABG (Grafts to LAD, Diag, and RCA) vs PCI to LAD and RCA. Recommend medical management of heart failure over the weekend,  followed by revascularization. Nigel Mormon, MD Pager: [phone] Office: [phone]       Scheduled Meds: . aspirin EC  81 mg Oral Daily  . furosemide  60 mg Intravenous BID  . isosorbide-hydrALAZINE  1 tablet Oral TID  . metoprolol succinate  50 mg Oral Daily  . rosuvastatin  20 mg Oral QHS  . sodium chloride flush  3 mL Intravenous Q12H  . traZODone  50 mg Oral QHS   Continuous Infusions: . sodium chloride    . ferumoxytol 510 mg (05/12/21 1616)  . heparin 1,250 Units/hr (05/13/21 0345)  . nitroGLYCERIN 30 mcg/min (05/12/21 1625)  LOS: 2 days        Hosie Poisson, MD [facility]   To contact the attending provider between 7A-7P or the covering provider during after hours 7P-7A, please log into the web site [email or web address] and access using universal [place] password for that web site. If you do not have the password, please call the hospital operator.  05/13/2021, 3:00 PM

## 2021-05-13 NOTE — Progress Notes (Signed)
Subjective:  Breathing well. No chest pain   Objective:  Vital Signs in the last 24 hours: Temp:  [97.2 F (36.2 C)-99.8 F (37.7 C)] 97.2 F (36.2 C) (06/05 0511) Pulse Rate:  [76-92] 77 (06/05 0820) Resp:  [16-20] 17 (06/05 0511) BP: (118-155)/(60-94) 131/70 (06/05 0820) SpO2:  [93 %-99 %] 93 % (06/05 0511) Weight:  [93.8 kg] 93.8 kg (06/05 0511)  Intake/Output from previous day: 06/04 0701 - 06/05 0700 In: 562.5 [P.O.:240; I.V.:209.3; IV Piggyback:113.2] Out: 1075 [Urine:1075]  Physical Exam Vitals and nursing note reviewed.  Constitutional:      General: She is not in acute distress.    Appearance: She is well-developed.  HENT:     Head: Normocephalic and atraumatic.  Eyes:     Conjunctiva/sclera: Conjunctivae normal.     Pupils: Pupils are equal, round, and reactive to light.  Neck:     Vascular: No JVD.  Cardiovascular:     Rate and Rhythm: Normal rate and regular rhythm.     Pulses: Normal pulses and intact distal pulses.     Heart sounds: No murmur heard.   Pulmonary:     Effort: Pulmonary effort is normal.     Breath sounds: No wheezing or rales.  Abdominal:     General: Bowel sounds are normal.     Palpations: Abdomen is soft.     Tenderness: There is no rebound.  Musculoskeletal:        General: No tenderness. Normal range of motion.     Right lower leg: No edema.     Left lower leg: No edema.  Lymphadenopathy:     Cervical: No cervical adenopathy.  Skin:    General: Skin is warm and dry.  Neurological:     Mental Status: She is alert and oriented to person, place, and time.     Cranial Nerves: No cranial nerve deficit.      Lab Results: BMP Recent Labs    10/27/20 1059 01/05/21 0917 01/26/21 1040 04/13/21 1010 05/11/21 1818 05/12/21 0250 05/13/21 0340  NA 142 145* 144   < > 138 139 139  K 4.2 4.1 4.3   < > 4.3 4.0 3.8  CL 101 99 98   < > 103 103 106  CO2 '26 29 28   '$ < > '25 25 28  '$ GLUCOSE 105* 86 80   < > 114* 136* 114*  BUN 27  48* 41*   < > 18 19 24*  CREATININE 1.89* 2.40* 2.22*   < > 1.85* 1.90* 1.85*  CALCIUM 9.8 9.8 9.9   < > 9.0 8.9 8.9  GFRNONAA 25* 19* 20*   < > 27* 26* 27*  GFRAA 28* 21* 23*  --   --   --   --    < > = values in this interval not displayed.    CBC Recent Labs  Lab 05/13/21 0340  WBC 6.7  RBC 3.17*  HGB 9.6*  HCT 30.2*  PLT 126*  MCV 95.3  MCH 30.3  MCHC 31.8  RDW 13.5    HEMOGLOBIN A1C Lab Results  Component Value Date   HGBA1C 6.1 (H) 05/11/2021   MPG 128 05/11/2021    Cardiac Panel (last 3 results) Results for ETHELYNE, LINCE (MRN [id number]) as of 05/12/2021 12:10  Ref. Range 05/10/2021 19:19 05/10/2021 21:19 05/11/2021 04:38 05/11/2021 10:04  Troponin I (High Sensitivity) Latest Ref Range: <18 ng/L 75 (H) 88 (H) 131 (HH) 171 (HH)   BNP (last 3 results)  Recent Labs    08/26/20 0505 08/27/20 0818 05/11/21 0036  BNP 675.7* 424.0* 598.8*    TSH Recent Labs    08/22/20 1759  TSH 1.054    Lipid Panel     Component Value Date/Time   CHOL 130 08/29/2020 0250   TRIG 89 08/29/2020 0250   HDL 47 08/29/2020 0250   CHOLHDL 2.8 08/29/2020 0250   VLDL 18 08/29/2020 0250   LDLCALC 65 08/29/2020 0250     Hepatic Function Panel Recent Labs    08/22/20 1759 08/26/20 2304 09/25/20 1951 05/12/21 0250 05/13/21 0340  PROT 7.2 6.5 6.6  --   --   ALBUMIN 2.8* 2.5* 3.5 3.1* 2.8*  AST 190* 50* 20  --   --   ALT 66* 45* 12  --   --   ALKPHOS 79 53 55  --   --   BILITOT 0.5 0.9 0.4  --   --   BILIDIR  --  0.2  --   --   --   IBILI  --  0.7  --   --   --     Imaging: Chest x-ray 05/10/2021: 1. Mild cardiomegaly with interstitial edema and Kerley B lines. 2. Chronic elevation of the right hemidiaphragm. 3.  Aortic Atherosclerosis (ICD10-I70.0).   Cardiac Studies:  EKG 05/12/2021: Sinus rhythm 100 bpm Left atrial enlargement Left ventricular hypertrophy Old anteroseptal infarct  Coronary angiography 05/11/2021: LM: Normal LAD: Prox 60% stenosis        Mid  95% stenosis just prox to large bifurcating diag        Diag ostium does not seem to be involved.        (Prior cath in 08/2020 noted prox 80% stenosis, followed by long mid to apical LAD occlusion, which seems to have recanalized) LCx: No significant disease RCA: Ostial 90% stenosis         (Significant dampening of pressure noted)         RPL 60% stenosis RCA and LCx system seems largely unchanged compared to 08/2020  Two vessel CAD (Prox-mid LAD and ostial RCA) Elevated LVEDP 29 mmHg  Given acute HFrEF, recommend staged revascularization. Options include CABG (Grafts to LAD, Diag, and RCA) vs PCI to LAD and RCA. Recommend medical management of heart failure over the weekend, followed by revascularization.   Echocardiogram 05/11/2021: 1. Left ventricular ejection fraction, by estimation, is 45 to 50%. The  left ventricle has normal function. The left ventricle demonstrates  regional wall motion abnormalities (see scoring diagram/findings for  description). Left ventricular diastolic  function could not be evaluated. There is akinesis of the left  ventricular, mid-apical anterior wall and anteroseptal wall. There is  akinesis of the left ventricular, apical segment.  2. Right ventricular systolic function is normal. The right ventricular  size is normal.  3. Left atrial size was severely dilated.  4. The mitral valve is grossly normal. Mild mitral valve regurgitation.  5. The aortic valve is tricuspid. Aortic valve regurgitation is not  visualized.  6. The inferior vena cava is normal in size with <50% respiratory  variability, suggesting right atrial pressure of 8 mmHg.  7. Compared to previous study on 12/15/2020, LVEF is marginally lower from  50-55%. Wall motion abnormalities are more pronounced.   Assessment & Recommendations:  80 y.o. African-American female  with coronary artery disease (proximal LAD occlusion, ostial RCA critical stenosis), history of post op MI  after spinal surgery (08/2020), along with stroke-medically treated, CKD stage  IV, type 2 diabetes mellitus, paroxysmal atrial fibrillation, admitted with NSTEMI, acute on chronic HFrEF  NSTEMI: Chest pain improved with IV nitroglycerin.  High-sensitivity troponin 75--->171 Two-vessel CAD with critical mid LAD and ostial RCA stenoses. Given her recurrent heart failure and non-STEMI presentation, recommend revascularization however medical therapy alone. Discussed options of CABG versus PCI.  Presented the following data to the patient.   STS short term risk calculator:       CIR risk with PCI: (Using Mehran score) Risk of Mortality: 8.810%                     CIN risk: 26%  Renal Failure: 17.538%                       risk of dialysis: 1.09% Permanent Stroke: 5.529%  Prolonged Ventilation: 24.192%            DSW Infection: 0.262% Reoperation: 2.640% Morbidity or Mortality: 39.211% Short Length of Stay: 8.193% Long Length of Stay: 26.387%  Syntax score I: 8 (low complexity) Syntax score II: PCI: 58: 4 year mortality risk: 54% CABG: 4 year mortality risk: 29% Syntax recommendation: CABG or PCI  Overall, I quoted better long-term prognosis with CABG, but better short-term prognosis with PCI.  Perioperative/periprocedural concerns are highest regarding her stroke risk and renal failure risk.  I also offered surgical evaluation.    Patient discussed with her daughter and son, prefers to undergo PCI.  Continue optimization of heart failure. Will tentatively plan for PCI to culprit LAD onTuesday 05/15/2021 morning. Will consider RCA PCI outpatient in future. Continue aspirin, heparin, statin, metoprolol.  Acute on chronic HFrEF: Secondary to ischemic cardiomyopathy. LVEDP 29 on diagnostic angiogram 05/11/2021. Currently on Lasix--increased to 60 mg twice daily as per nephrology recommendation. ContinueBiDil 20-37.5 mg twice daily, metoprolol succinate 50 mg daily. Not on ACE/ARB/Arni due  to renal dysfunction. UOP 1.0 L/24 hrs.  Overall appears fairly euvolumic today. I expect we can reduce lasix by tomorrow 05/14/2021.  CKD stage IV: Creatinine improved and stabilized compared to May 2022. 1.85 today. Agree with holding losartan. Appreciate input from nephrologist Dr. Posey Pronto.  History of stroke: Cerebellar stroke in 08/2020. Patient reportedly had an episode of left-sided facial numbness a week ago, but denies any other recent acute stroke signs or symptoms. Brain MRI shows old right parietal and left cerebellar infarcts, no acute abnormality.     Nigel Mormon, MD Pager: [phone] Office: [phone]

## 2021-05-13 NOTE — Progress Notes (Signed)
[place] for heparin Indication: chest pain/ACS and atrial fibrillation  No Known Allergies  Patient Measurements: Height: '5\' 4"'$  (162.6 cm) Weight: 93.8 kg (206 lb 12.8 oz) IBW/kg (Calculated) : 54.7 Heparin Dosing Weight:77kg  Vital Signs: Temp: 97.2 F (36.2 C) (06/05 0511) Temp Source: Oral (06/05 0511) BP: 155/83 (06/05 0511) Pulse Rate: 76 (06/05 0511)  Labs: Recent Labs    05/10/21 1919 05/10/21 2119 05/11/21 0438 05/11/21 1004 05/11/21 1818 05/12/21 0250 05/12/21 1107 05/12/21 2247 05/13/21 0340  HGB 10.5*  --   --   --   --  9.7*  --   --  9.6*  HCT 34.0*  --   --   --   --  30.2*  --   --  30.2*  PLT 166  --   --   --   --  138*  --   --  126*  APTT  --   --   --   --   --   --  43* 78* 73*  HEPARINUNFRC  --   --   --   --   --   --  0.63  --  0.55  CREATININE 1.98*  --   --  2.04* 1.85* 1.90*  --   --  1.85*  TROPONINIHS 75* 88* 131* 171*  --   --   --   --   --     Estimated Creatinine Clearance: 26.9 mL/min (A) (by C-G formula based on SCr of 1.85 mg/dL (H)).   Medical History: Past Medical History:  Diagnosis Date  . Atrial fibrillation ([place])   . CHF (congestive heart failure) ([place])   . Coronary artery disease   . Diabetes mellitus without complication ([place])   . Hypercholesteremia   . Hypertension   . Renal disorder    CKD Stage IV  . Stroke [facility])     Assessment: 80 year old female with history of afib on apixaban. Appears her last dose of medication was yesterday 6/2 at 0800. New orders to transition to heparin for possible ACS given tropinin bump. Will monitor both aPTT and heparin levels since recent DOAC likely to falsely elevate heparin levels.   Pt s/p cath with 2vCAD 6/3, to restart heparin while deciding on revascularization plans - CABG vs PCI.  APTT remains therapeutic at 73 on gtt at 1250 units/h. CBC stable.   Goal of Therapy:  Heparin level 0.3-0.7 units/ml aPTT 66-102 seconds Monitor  platelets by anticoagulation protocol: Yes   Plan:  Continue heparin at 1250 units/hr Daily aPTT, heparin level, CBC F/u revascularization plan  Rebbeca Paul, PharmD PGY1 Pharmacy Resident 05/13/2021 7:52 AM  Please check [email or web address] for unit-specific pharmacy phone numbers.

## 2021-05-14 ENCOUNTER — Encounter (HOSPITAL_COMMUNITY): Payer: Self-pay | Admitting: Cardiology

## 2021-05-14 LAB — APTT: aPTT: 86 seconds — ABNORMAL HIGH (ref 24–36)

## 2021-05-14 LAB — RENAL FUNCTION PANEL
Albumin: 3 g/dL — ABNORMAL LOW (ref 3.5–5.0)
Anion gap: 10 (ref 5–15)
BUN: 29 mg/dL — ABNORMAL HIGH (ref 8–23)
CO2: 27 mmol/L (ref 22–32)
Calcium: 9.1 mg/dL (ref 8.9–10.3)
Chloride: 101 mmol/L (ref 98–111)
Creatinine, Ser: 2.06 mg/dL — ABNORMAL HIGH (ref 0.44–1.00)
GFR, Estimated: 24 mL/min — ABNORMAL LOW (ref >60)
Glucose, Bld: 103 mg/dL — ABNORMAL HIGH (ref 70–99)
Phosphorus: 3.5 mg/dL (ref 2.5–4.6)
Potassium: 3.8 mmol/L (ref 3.5–5.1)
Sodium: 138 mmol/L (ref 135–145)

## 2021-05-14 LAB — CBC
HCT: 30.7 % — ABNORMAL LOW (ref 36.0–46.0)
Hemoglobin: 9.8 g/dL — ABNORMAL LOW (ref 12.0–15.0)
MCH: 30 pg (ref 26.0–34.0)
MCHC: 31.9 g/dL (ref 30.0–36.0)
MCV: 93.9 fL (ref 80.0–100.0)
Platelets: 158 10*3/uL (ref 150–400)
RBC: 3.27 MIL/uL — ABNORMAL LOW (ref 3.87–5.11)
RDW: 13.5 % (ref 11.5–15.5)
WBC: 5.5 10*3/uL (ref 4.0–10.5)
nRBC: 0 % (ref 0.0–0.2)

## 2021-05-14 LAB — HEPARIN LEVEL (UNFRACTIONATED): Heparin Unfractionated: 0.71 IU/mL — ABNORMAL HIGH (ref 0.30–0.70)

## 2021-05-14 MED ORDER — SODIUM CHLORIDE 0.9% FLUSH: 3.0000 mL | INTRAVENOUS | Status: DC | PRN

## 2021-05-14 MED ORDER — SODIUM CHLORIDE 0.9 % IV SOLN: INTRAVENOUS | Status: AC

## 2021-05-14 MED ORDER — ASPIRIN 81 MG PO CHEW: 81.0000 mg | CHEWABLE_TABLET | ORAL | Status: DC

## 2021-05-14 MED ORDER — SODIUM CHLORIDE 0.9% FLUSH: 3.0000 mL | Freq: Two times a day (BID) | INTRAVENOUS | Status: DC

## 2021-05-14 MED ORDER — SODIUM CHLORIDE 0.9 % IV SOLN: 250.0000 mL | INTRAVENOUS | Status: DC | PRN

## 2021-05-14 NOTE — Progress Notes (Addendum)
Patient ID: Lorree Yousaf, female   DOB: Apr 23, 1941, 80 y.o.   MRN: [id number] S: No complaints. O:BP 119/81 (BP Location: Right Arm)   Pulse 72   Temp 98.4 F (36.9 C) (Oral)   Resp 18   Ht '5\' 4"'$  (1.626 m)   Wt 92.3 kg   SpO2 96%   BMI 34.93 kg/m   Intake/Output Summary (Last 24 hours) at 05/14/2021 1457 Last data filed at 05/14/2021 0859 Gross per 24 hour  Intake 1889.62 ml  Output 3000 ml  Net -1110.38 ml   Intake/Output: I/O last 3 completed shifts: In: 1629.6 [P.O.:1120; I.V.:509.6] Out: 3700 [Urine:3700]  Intake/Output this shift:  Total I/O In: 260 [P.O.:260] Out: -  Weight change: -1.093 kg Gen: NAD CVS: RRR Resp: CTA Abd: benign Ext: no edema  Recent Labs  Lab 05/10/21 1919 05/11/21 1004 05/11/21 1818 05/12/21 0250 05/13/21 0340 05/14/21 0624  NA 142 141 138 139 139 138  K 3.9 4.9 4.3 4.0 3.8 3.8  CL 107 110 103 103 106 101  CO2 '27 26 25 25 28 27  '$ GLUCOSE 214* 129* 114* 136* 114* 103*  BUN '23 21 18 19 '$ 24* 29*  CREATININE 1.98* 2.04* 1.85* 1.90* 1.85* 2.06*  ALBUMIN  --   --   --  3.1* 2.8* 3.0*  CALCIUM 9.0 9.1 9.0 8.9 8.9 9.1  PHOS  --   --   --  3.5 3.3 3.5   Liver Function Tests: Recent Labs  Lab 05/12/21 0250 05/13/21 0340 05/14/21 0624  ALBUMIN 3.1* 2.8* 3.0*   No results for input(s): LIPASE, AMYLASE in the last 168 hours. No results for input(s): AMMONIA in the last 168 hours. CBC: Recent Labs  Lab 05/10/21 1919 05/12/21 0250 05/13/21 0340 05/14/21 0624  WBC 8.0 10.3 6.7 5.5  HGB 10.5* 9.7* 9.6* 9.8*  HCT 34.0* 30.2* 30.2* 30.7*  MCV 98.0 95.6 95.3 93.9  PLT 166 138* 126* 158   Cardiac Enzymes: No results for input(s): CKTOTAL, CKMB, CKMBINDEX, TROPONINI in the last 168 hours. CBG: No results for input(s): GLUCAP in the last 168 hours.  Iron Studies: No results for input(s): IRON, TIBC, TRANSFERRIN, FERRITIN in the last 72 hours. Studies/Results: MR BRAIN WO CONTRAST  Result Date: 05/13/2021 CLINICAL DATA:  Dizziness  EXAM: MRI HEAD WITHOUT CONTRAST TECHNIQUE: Multiplanar, multiecho pulse sequences of the brain and surrounding structures were obtained without intravenous contrast. COMPARISON:  None. FINDINGS: Brain: No acute infarct, mass effect or extra-axial collection. Widespread magnetic susceptibility effect suggests prior iron infusion. There is multifocal hyperintense T2-weighted signal within the white matter. Parenchymal volume and CSF spaces are normal. Old right parietal and left cerebellar infarcts. No acute hemorrhage. The midline structures are normal. Vascular: Major flow voids are preserved. Skull and upper cervical spine: Normal calvarium and skull base. Visualized upper cervical spine and soft tissues are normal. Sinuses/Orbits:No paranasal sinus fluid levels or advanced mucosal thickening. No mastoid or middle ear effusion. Normal orbits. IMPRESSION: 1. No acute intracranial abnormality. 2. Old right parietal and left cerebellar infarcts. Electronically Signed   By: Ulyses Jarred M.D.   On: 05/13/2021 00:12   . aspirin EC  81 mg Oral Daily  . isosorbide-hydrALAZINE  1 tablet Oral TID  . metoprolol succinate  50 mg Oral Daily  . rosuvastatin  20 mg Oral QHS  . sodium chloride flush  3 mL Intravenous Q12H  . traZODone  50 mg Oral QHS    BMET    Component Value Date/Time   NA 138  05/14/2021 0624   NA 143 05/03/2021 0930   K 3.8 05/14/2021 0624   CL 101 05/14/2021 0624   CO2 27 05/14/2021 0624   GLUCOSE 103 (H) 05/14/2021 0624   BUN 29 (H) 05/14/2021 0624   BUN 72 (H) 05/03/2021 0930   CREATININE 2.06 (H) 05/14/2021 0624   CALCIUM 9.1 05/14/2021 0624   GFRNONAA 24 (L) 05/14/2021 0624   GFRAA 23 (L) 01/26/2021 1040   CBC    Component Value Date/Time   WBC 5.5 05/14/2021 0624   RBC 3.27 (L) 05/14/2021 0624   HGB 9.8 (L) 05/14/2021 0624   HGB 11.4 03/13/2020 0949   HCT 30.7 (L) 05/14/2021 0624   HCT 36.5 03/13/2020 0949   PLT 158 05/14/2021 0624   PLT 425 03/13/2020 0949   MCV 93.9  05/14/2021 0624   MCV 95 03/13/2020 0949   MCH 30.0 05/14/2021 0624   MCHC 31.9 05/14/2021 0624   RDW 13.5 05/14/2021 0624   RDW 12.6 03/13/2020 0949   LYMPHSABS 2.0 09/25/2020 1951   LYMPHSABS 2.4 02/28/2020 1725   MONOABS 1.0 09/25/2020 1951   EOSABS 0.1 09/25/2020 1951   EOSABS 0.2 02/28/2020 1725   BASOSABS 0.0 09/25/2020 1951   BASOSABS 0.1 02/28/2020 1725     Assessment/Plan:  1. NSTEMI:  Improved with IV NTG and plan for PCI as she declined CABG. 2. CKD stage IV:  Presumably due to hypertensive nephrosclerosis.  Scr has been stable and at her baseline.  Continue to follow.  Will hold IV lasix given brisk diuresis and slight increase in BUN/Cr. 3. HTN:  Stable 4. Anemia of CKD stage IV:  Started on IV iron and follow.  May need ESA.  Donetta Potts, MD [employer] [phone]

## 2021-05-14 NOTE — Progress Notes (Signed)
Subjective:  Breathing well. No chest pain  Cr up to 2.04.   Objective:  Vital Signs in the last 24 hours: Temp:  [98.2 F (36.8 C)-98.3 F (36.8 C)] 98.3 F (36.8 C) (06/06 0452) Pulse Rate:  [78-83] 78 (06/06 0853) Resp:  [18] 18 (06/06 0452) BP: (126-150)/(72-86) 126/76 (06/06 0853) SpO2:  [96 %-100 %] 100 % (06/06 0452) Weight:  [92.3 kg] 92.3 kg (06/06 0452)  Intake/Output from previous day: 06/05 0701 - 06/06 0700 In: 1629.6 [P.O.:1120; I.V.:509.6] Out: 3000 [Urine:3000]  Physical Exam Vitals and nursing note reviewed.  Constitutional:      General: She is not in acute distress.    Appearance: She is well-developed.  HENT:     Head: Normocephalic and atraumatic.  Eyes:     Conjunctiva/sclera: Conjunctivae normal.     Pupils: Pupils are equal, round, and reactive to light.  Neck:     Vascular: No JVD.  Cardiovascular:     Rate and Rhythm: Normal rate and regular rhythm.     Pulses: Normal pulses and intact distal pulses.     Heart sounds: No murmur heard.   Pulmonary:     Effort: Pulmonary effort is normal.     Breath sounds: No wheezing or rales.  Abdominal:     General: Bowel sounds are normal.     Palpations: Abdomen is soft.     Tenderness: There is no rebound.  Musculoskeletal:        General: No tenderness. Normal range of motion.     Right lower leg: No edema.     Left lower leg: No edema.  Lymphadenopathy:     Cervical: No cervical adenopathy.  Skin:    General: Skin is warm and dry.  Neurological:     Mental Status: She is alert and oriented to person, place, and time.     Cranial Nerves: No cranial nerve deficit.      Lab Results: BMP Recent Labs    10/27/20 1059 01/05/21 0917 01/26/21 1040 04/13/21 1010 05/12/21 0250 05/13/21 0340 05/14/21 0624  NA 142 145* 144   < > 139 139 138  K 4.2 4.1 4.3   < > 4.0 3.8 3.8  CL 101 99 98   < > 103 106 101  CO2 '26 29 28   '$ < > '25 28 27  '$ GLUCOSE 105* 86 80   < > 136* 114* 103*  BUN 27  48* 41*   < > 19 24* 29*  CREATININE 1.89* 2.40* 2.22*   < > 1.90* 1.85* 2.06*  CALCIUM 9.8 9.8 9.9   < > 8.9 8.9 9.1  GFRNONAA 25* 19* 20*   < > 26* 27* 24*  GFRAA 28* 21* 23*  --   --   --   --    < > = values in this interval not displayed.    CBC Recent Labs  Lab 05/14/21 0624  WBC 5.5  RBC 3.27*  HGB 9.8*  HCT 30.7*  PLT 158  MCV 93.9  MCH 30.0  MCHC 31.9  RDW 13.5    HEMOGLOBIN A1C Lab Results  Component Value Date   HGBA1C 6.1 (H) 05/11/2021   MPG 128 05/11/2021    Cardiac Panel (last 3 results) Results for TYRENE, BOWES (MRN [id number]) as of 05/12/2021 12:10  Ref. Range 05/10/2021 19:19 05/10/2021 21:19 05/11/2021 04:38 05/11/2021 10:04  Troponin I (High Sensitivity) Latest Ref Range: <18 ng/L 75 (H) 88 (H) 131 (HH) 171 (HH)   BNP (  last 3 results) Recent Labs    08/26/20 0505 08/27/20 0818 05/11/21 0036  BNP 675.7* 424.0* 598.8*    TSH Recent Labs    08/22/20 1759  TSH 1.054    Lipid Panel     Component Value Date/Time   CHOL 130 08/29/2020 0250   TRIG 89 08/29/2020 0250   HDL 47 08/29/2020 0250   CHOLHDL 2.8 08/29/2020 0250   VLDL 18 08/29/2020 0250   LDLCALC 65 08/29/2020 0250     Hepatic Function Panel Recent Labs    08/22/20 1759 08/26/20 2304 09/25/20 1951 05/12/21 0250 05/13/21 0340 05/14/21 0624  PROT 7.2 6.5 6.6  --   --   --   ALBUMIN 2.8* 2.5* 3.5 3.1* 2.8* 3.0*  AST 190* 50* 20  --   --   --   ALT 66* 45* 12  --   --   --   ALKPHOS 79 53 55  --   --   --   BILITOT 0.5 0.9 0.4  --   --   --   BILIDIR  --  0.2  --   --   --   --   IBILI  --  0.7  --   --   --   --     Imaging: Chest x-ray 05/10/2021: 1. Mild cardiomegaly with interstitial edema and Kerley B lines. 2. Chronic elevation of the right hemidiaphragm. 3.  Aortic Atherosclerosis (ICD10-I70.0).   Cardiac Studies:  EKG 05/12/2021: Sinus rhythm 100 bpm Left atrial enlargement Left ventricular hypertrophy Old anteroseptal infarct  Coronary angiography  05/11/2021: LM: Normal LAD: Prox 60% stenosis        Mid 95% stenosis just prox to large bifurcating diag        Diag ostium does not seem to be involved.        (Prior cath in 08/2020 noted prox 80% stenosis, followed by long mid to apical LAD occlusion, which seems to have recanalized) LCx: No significant disease RCA: Ostial 90% stenosis         (Significant dampening of pressure noted)         RPL 60% stenosis RCA and LCx system seems largely unchanged compared to 08/2020  Two vessel CAD (Prox-mid LAD and ostial RCA) Elevated LVEDP 29 mmHg  Given acute HFrEF, recommend staged revascularization. Options include CABG (Grafts to LAD, Diag, and RCA) vs PCI to LAD and RCA. Recommend medical management of heart failure over the weekend, followed by revascularization.   Echocardiogram 05/11/2021: 1. Left ventricular ejection fraction, by estimation, is 45 to 50%. The  left ventricle has normal function. The left ventricle demonstrates  regional wall motion abnormalities (see scoring diagram/findings for  description). Left ventricular diastolic  function could not be evaluated. There is akinesis of the left  ventricular, mid-apical anterior wall and anteroseptal wall. There is  akinesis of the left ventricular, apical segment.  2. Right ventricular systolic function is normal. The right ventricular  size is normal.  3. Left atrial size was severely dilated.  4. The mitral valve is grossly normal. Mild mitral valve regurgitation.  5. The aortic valve is tricuspid. Aortic valve regurgitation is not  visualized.  6. The inferior vena cava is normal in size with <50% respiratory  variability, suggesting right atrial pressure of 8 mmHg.  7. Compared to previous study on 12/15/2020, LVEF is marginally lower from  50-55%. Wall motion abnormalities are more pronounced.   Assessment & Recommendations:  80 y.o. African-American female  with coronary artery disease (proximal LAD  occlusion, ostial RCA critical stenosis), history of post op MI after spinal surgery (08/2020), along with stroke-medically treated, CKD stage IV, type 2 diabetes mellitus, paroxysmal atrial fibrillation, admitted with NSTEMI, acute on chronic HFrEF  NSTEMI: Chest pain improved with IV nitroglycerin.  High-sensitivity troponin 75--->171 Two-vessel CAD with critical mid LAD and ostial RCA stenoses. Given her recurrent heart failure and non-STEMI presentation, recommend revascularization however medical therapy alone. Discussed options of CABG versus PCI.  Presented the following data to the patient.   STS short term risk calculator:       CIR risk with PCI: (Using Mehran score) Risk of Mortality: 8.810%                     CIN risk: 26%  Renal Failure: 17.538%                       risk of dialysis: 1.09% Permanent Stroke: 5.529%  Prolonged Ventilation: 24.192%            DSW Infection: 0.262% Reoperation: 2.640% Morbidity or Mortality: 39.211% Short Length of Stay: 8.193% Long Length of Stay: 26.387%  Syntax score I: 8 (low complexity) Syntax score II: PCI: 58: 4 year mortality risk: 54% CABG: 4 year mortality risk: 29% Syntax recommendation: CABG or PCI  Overall, I quoted better long-term prognosis with CABG, but better short-term prognosis with PCI.  Perioperative/periprocedural concerns are highest regarding her stroke risk and renal failure risk.  I also offered surgical evaluation.    Patient discussed with her daughter and son, prefers to undergo PCI.  Continue optimization of heart failure. Will tentatively plan for PCI to culprit LAD onTuesday 05/15/2021 morning. Will consider RCA PCI outpatient in future. Continue aspirin, heparin, statin, metoprolol.  Acute on chronic HFrEF: Secondary to ischemic cardiomyopathy. ContinueBiDil 20-37.5 mg twice daily, metoprolol succinate 50 mg daily. Not on ACE/ARB/Arni due to renal dysfunction. UOP 3.0 L/24 hrs.  Overall appears dry on  exam today. Will stop lasix.  Encourage PO hydration today. Will add gentle 50 cc/hr hydration late tonight in preparation for PCI on 6/7.  CKD stage IV: Creatinine improved and stabilized compared to May 2022. 2.04 today, about her baseline on admission.  Cr increased likely due to diuresis. Lasix stopped today.  Agree with holding losartan. Appreciate input from nephrologist Dr. Posey Pronto.  History of stroke: Cerebellar stroke in 08/2020. Patient reportedly had an episode of left-sided facial numbness a week ago, but denies any other recent acute stroke signs or symptoms. Brain MRI shows old right parietal and left cerebellar infarcts, no acute abnormality.     Nigel Mormon, MD Pager: [phone] Office: [phone]

## 2021-05-14 NOTE — Progress Notes (Signed)
[facility] for heparin Indication: chest pain/ACS and atrial fibrillation  No Known Allergies  Patient Measurements: Height: '5\' 4"'$  (162.6 cm) Weight: 92.3 kg (203 lb 8 oz) IBW/kg (Calculated) : 54.7 Heparin Dosing Weight:77kg  Vital Signs: Temp: 98.3 F (36.8 C) (06/06 0452) Temp Source: Oral (06/06 0452) BP: 126/76 (06/06 0853) Pulse Rate: 78 (06/06 0853)  Labs: Recent Labs    05/12/21 0250 05/12/21 1107 05/12/21 1107 05/12/21 2247 05/13/21 0340 05/14/21 0624  HGB 9.7*  --   --   --  9.6* 9.8*  HCT 30.2*  --   --   --  30.2* 30.7*  PLT 138*  --   --   --  126* 158  APTT  --  43*   < > 78* 73* 86*  HEPARINUNFRC  --  0.63  --   --  0.55 0.71*  CREATININE 1.90*  --   --   --  1.85* 2.06*   < > = values in this interval not displayed.    Estimated Creatinine Clearance: 24 mL/min (A) (by C-G formula based on SCr of 2.06 mg/dL (H)).   Medical History: Past Medical History:  Diagnosis Date  . Atrial fibrillation ([place])   . CHF (congestive heart failure) ([place])   . Coronary artery disease   . Diabetes mellitus without complication ([place])   . Hypercholesteremia   . Hypertension   . Renal disorder    CKD Stage IV  . Stroke [facility])     Assessment: 80 year old female with history of afib on apixaban transitioned to IV heparin.  APTT is therapeutic, heparin level slightly elevated due to DOAC use, CBC stable, cath tomorrow.   Goal of Therapy:  Heparin level 0.3-0.7 units/ml aPTT 66-102 seconds Monitor platelets by anticoagulation protocol: Yes   Plan:  Continue heparin at 1250 units/hr Daily aPTT, heparin level, CBC   Arrie Senate, PharmD, [place], [facility] Clinical Pharmacist [phone] Please check AMION for all [place] numbers 05/14/2021

## 2021-05-14 NOTE — Progress Notes (Signed)
PROGRESS NOTE    Kristin [name]  [id number] DOB: [birth year] DOA: 05/10/2021 PCP: Caren Macadam, MD    Chief Complaint  Patient presents with  . Chest Pain    Brief Narrative:  Kristin [name] a 80 y.o.femalewith medical history significant forCAD, A-fib, HFpEF, CKD 4, HTN who presents for evaluation of chest pain.She reports over the last 2 days she has been experiencing chest pain that goes across her entire chest region and will radiate around to her back. She states that she had an a heart attack in September 2021 and she was concerned of having another heart attack. She was started on IV heparin and IV nitro. Cardiology consulted and she is underwent cardiac catheterization on 05/11/21, showing two vessel disease. Patient's creatinine slightly up today with IV diuresis. Lasix held and Plan for staged revascularization tomorrow with PCI.   Assessment & Plan:   Principal Problem:   Chest pain Active Problems:   Hypertension   Non-ST elevation (NSTEMI) myocardial infarction (HCC)   Elevated troponin   CKD (chronic kidney disease) stage 4, GFR 15-29 ml/min (HCC)   HFrEF (heart failure with reduced ejection fraction) (HCC)   NSTEMI (non-ST elevated myocardial infarction) ([place])   NSTEMI Patient is currently on IV heparin and IV nitro gtt,  continue with aspirin, metoprolol 50 mg daily, Crestor 20 mg daily.   She underwent cardiac catheterization showing two-vessel disease.   She is scheduled for a staged revascularization /PCI tomorrow.  She denies any chest pain or sob.    Heaviness of the head with some dizziness persistent since last September/ facial numbness/ loss of taste since last stroke.  Initial  CT of the head is negative for acute stroke. Due to persistent symptoms followed up with an MRI showing Old right parietal stroke and left cerebellar infarcts. Recommend outpatient neurology follow up on discharge.    Essential hypertension Blood pressure  parameters well controlled    Stage IV CKD Continue to monitor cr Creatinine bumped from 1.8-2.06 this morning holding IV Lasix and repeat renal parameters tomorrow.  Nephrology on board.     Acute on Chronic diastolic heart failure/ with presevered LVEF.  Diuresed well with IV Lasix, liters of urine output yesterday IV Lasix discontinued due to bump in creatinine from 1.8-2.06. Continue with strike intake and output. Daily weights.  Continue with bidil.  Patient appears euvolemic today   Stroke in September 2021 Patient reports persistent heaviness of the head, MRI of the brain ordered for further evaluation, negative for acute stroke.    Anemia of chronic disease secondary to CKD Hemoglobin appears to be stable around 9. Continue to monitor    DVT prophylaxis: IV heparin  code Status: full code.  Family Communication: none at bedside.  Disposition:   Status is: Inpatient  Remains inpatient appropriate because:Ongoing diagnostic testing needed not appropriate for outpatient work up, Unsafe d/c plan and IV treatments appropriate due to intensity of illness or inability to take PO   Dispo: The patient is from: Home              Anticipated d/c is to: pending              Patient currently is not medically stable to d/c.   Difficult to place patient No       Consultants:   Cardiology  Nephrology.    Procedures: cardiac catheterization    Antimicrobials: none.    Subjective: No new complaints today  Objective: Vitals:   05/13/21 2036  05/14/21 0452 05/14/21 0853 05/14/21 1226  BP: 135/81 (!) 150/86 126/76 119/81  Pulse: 78 83 78 72  Resp: '18 18  18  '$ Temp: 98.2 F (36.8 C) 98.3 F (36.8 C)  98.4 F (36.9 C)  TempSrc: Oral Oral  Oral  SpO2: 96% 100%  96%  Weight:  92.3 kg    Height:        Intake/Output Summary (Last 24 hours) at 05/14/2021 1443 Last data filed at 05/14/2021 0859 Gross per 24 hour  Intake 1889.62 ml  Output 3000 ml  Net  -1110.38 ml   Filed Weights   05/12/21 1000 05/13/21 0511 05/14/21 0452  Weight: 93.4 kg 93.8 kg 92.3 kg    Examination:  General exam: Elderly lady not in any kind of distress Respiratory system: Clear to auscultation bilaterally no wheezing or rhonchi Cardiovascular system: S1-S2 heard, regular rate rhythm, no JVD no pedal edema Gastrointestinal system: Abdomen is soft nontender nondistended bowel sounds normal Central nervous system: Alert and oriented, grossly nonfocal Extremities: No pedal edema or cyanosis Skin: No rashes seen Psychiatry:  Mood is appropriate.     Data Reviewed: I have personally reviewed following labs and imaging studies  CBC: Recent Labs  Lab 05/10/21 1919 05/12/21 0250 05/13/21 0340 05/14/21 0624  WBC 8.0 10.3 6.7 5.5  HGB 10.5* 9.7* 9.6* 9.8*  HCT 34.0* 30.2* 30.2* 30.7*  MCV 98.0 95.6 95.3 93.9  PLT 166 138* 126* 0000000    Basic Metabolic Panel: Recent Labs  Lab 05/11/21 1004 05/11/21 1818 05/12/21 0250 05/13/21 0340 05/14/21 0624  NA 141 138 139 139 138  K 4.9 4.3 4.0 3.8 3.8  CL 110 103 103 106 101  CO2 '26 25 25 28 27  '$ GLUCOSE 129* 114* 136* 114* 103*  BUN '21 18 19 '$ 24* 29*  CREATININE 2.04* 1.85* 1.90* 1.85* 2.06*  CALCIUM 9.1 9.0 8.9 8.9 9.1  PHOS  --   --  3.5 3.3 3.5    GFR: Estimated Creatinine Clearance: 24 mL/min (A) (by C-G formula based on SCr of 2.06 mg/dL (H)).  Liver Function Tests: Recent Labs  Lab 05/12/21 0250 05/13/21 0340 05/14/21 0624  ALBUMIN 3.1* 2.8* 3.0*    CBG: No results for input(s): GLUCAP in the last 168 hours.   Recent Results (from the past 240 hour(s))  Resp Panel by RT-PCR (Flu A&B, Covid) Nasopharyngeal Swab     Status: None   Collection Time: 05/11/21 12:36 AM   Specimen: Nasopharyngeal Swab; Nasopharyngeal(NP) swabs in vial transport medium  Result Value Ref Range Status   SARS Coronavirus 2 by RT PCR NEGATIVE NEGATIVE Final    Comment: (NOTE) SARS-CoV-2 target nucleic acids are  NOT DETECTED.  The SARS-CoV-2 RNA is generally detectable in upper respiratory specimens during the acute phase of infection. The lowest concentration of SARS-CoV-2 viral copies this assay can detect is 138 copies/mL. A negative result does not preclude SARS-Cov-2 infection and should not be used as the sole basis for treatment or other patient management decisions. A negative result may occur with  improper specimen collection/handling, submission of specimen other than nasopharyngeal swab, presence of viral mutation(s) within the areas targeted by this assay, and inadequate number of viral copies(<138 copies/mL). A negative result must be combined with clinical observations, patient history, and epidemiological information. The expected result is Negative.  Fact Sheet for Patients:  EntrepreneurPulse.com.au  Fact Sheet for Healthcare Providers:  IncredibleEmployment.be  This test is no t yet approved or cleared by the [place] FDA  and  has been authorized for detection and/or diagnosis of SARS-CoV-2 by FDA under an Emergency Use Authorization (EUA). This EUA will remain  in effect (meaning this test can be used) for the duration of the COVID-19 declaration under Section 564(b)(1) of the Act, 21 U.S.C.section 360bbb-3(b)(1), unless the authorization is terminated  or revoked sooner.       Influenza A by PCR NEGATIVE NEGATIVE Final   Influenza B by PCR NEGATIVE NEGATIVE Final    Comment: (NOTE) The Xpert Xpress SARS-CoV-2/FLU/RSV plus assay is intended as an aid in the diagnosis of influenza from Nasopharyngeal swab specimens and should not be used as a sole basis for treatment. Nasal washings and aspirates are unacceptable for Xpert Xpress SARS-CoV-2/FLU/RSV testing.  Fact Sheet for Patients: EntrepreneurPulse.com.au  Fact Sheet for Healthcare Providers: IncredibleEmployment.be  This test is not  yet approved or cleared by the [place] FDA and has been authorized for detection and/or diagnosis of SARS-CoV-2 by FDA under an Emergency Use Authorization (EUA). This EUA will remain in effect (meaning this test can be used) for the duration of the COVID-19 declaration under Section 564(b)(1) of the Act, 21 U.S.C. section 360bbb-3(b)(1), unless the authorization is terminated or revoked.  Performed at [facility], [place] [address], [place], [place] [address]          Radiology Studies: MR BRAIN WO CONTRAST  Result Date: 05/13/2021 CLINICAL DATA:  Dizziness EXAM: MRI HEAD WITHOUT CONTRAST TECHNIQUE: Multiplanar, multiecho pulse sequences of the brain and surrounding structures were obtained without intravenous contrast. COMPARISON:  None. FINDINGS: Brain: No acute infarct, mass effect or extra-axial collection. Widespread magnetic susceptibility effect suggests prior iron infusion. There is multifocal hyperintense T2-weighted signal within the white matter. Parenchymal volume and CSF spaces are normal. Old right parietal and left cerebellar infarcts. No acute hemorrhage. The midline structures are normal. Vascular: Major flow voids are preserved. Skull and upper cervical spine: Normal calvarium and skull base. Visualized upper cervical spine and soft tissues are normal. Sinuses/Orbits:No paranasal sinus fluid levels or advanced mucosal thickening. No mastoid or middle ear effusion. Normal orbits. IMPRESSION: 1. No acute intracranial abnormality. 2. Old right parietal and left cerebellar infarcts. Electronically Signed   By: Ulyses Jarred M.D.   On: 05/13/2021 00:12        Scheduled Meds: . aspirin EC  81 mg Oral Daily  . isosorbide-hydrALAZINE  1 tablet Oral TID  . metoprolol succinate  50 mg Oral Daily  . rosuvastatin  20 mg Oral QHS  . sodium chloride flush  3 mL Intravenous Q12H  . traZODone  50 mg Oral QHS   Continuous Infusions: . sodium chloride    . ferumoxytol  510 mg (05/12/21 1616)  . heparin 1,250 Units/hr (05/14/21 0029)  . nitroGLYCERIN 30 mcg/min (05/13/21 2237)     LOS: 3 days        Hosie Poisson, MD [facility]   To contact the attending provider between 7A-7P or the covering provider during after hours 7P-7A, please log into the web site [email or web address] and access using universal [place] password for that web site. If you do not have the password, please call the hospital operator.  05/14/2021, 2:43 PM

## 2021-05-14 NOTE — H&P (View-Only) (Signed)
Subjective:  Breathing well. No chest pain  Cr up to 2.04.   Objective:  Vital Signs in the last 24 hours: Temp:  [98.2 F (36.8 C)-98.3 F (36.8 C)] 98.3 F (36.8 C) (06/06 0452) Pulse Rate:  [78-83] 78 (06/06 0853) Resp:  [18] 18 (06/06 0452) BP: (126-150)/(72-86) 126/76 (06/06 0853) SpO2:  [96 %-100 %] 100 % (06/06 0452) Weight:  [92.3 kg] 92.3 kg (06/06 0452)  Intake/Output from previous day: 06/05 0701 - 06/06 0700 In: 1629.6 [P.O.:1120; I.V.:509.6] Out: 3000 [Urine:3000]  Physical Exam Vitals and nursing note reviewed.  Constitutional:      General: She is not in acute distress.    Appearance: She is well-developed.  HENT:     Head: Normocephalic and atraumatic.  Eyes:     Conjunctiva/sclera: Conjunctivae normal.     Pupils: Pupils are equal, round, and reactive to light.  Neck:     Vascular: No JVD.  Cardiovascular:     Rate and Rhythm: Normal rate and regular rhythm.     Pulses: Normal pulses and intact distal pulses.     Heart sounds: No murmur heard.   Pulmonary:     Effort: Pulmonary effort is normal.     Breath sounds: No wheezing or rales.  Abdominal:     General: Bowel sounds are normal.     Palpations: Abdomen is soft.     Tenderness: There is no rebound.  Musculoskeletal:        General: No tenderness. Normal range of motion.     Right lower leg: No edema.     Left lower leg: No edema.  Lymphadenopathy:     Cervical: No cervical adenopathy.  Skin:    General: Skin is warm and dry.  Neurological:     Mental Status: She is alert and oriented to person, place, and time.     Cranial Nerves: No cranial nerve deficit.      Lab Results: BMP Recent Labs    10/27/20 1059 01/05/21 0917 01/26/21 1040 04/13/21 1010 05/12/21 0250 05/13/21 0340 05/14/21 0624  NA 142 145* 144   < > 139 139 138  K 4.2 4.1 4.3   < > 4.0 3.8 3.8  CL 101 99 98   < > 103 106 101  CO2 '26 29 28   '$ < > '25 28 27  '$ GLUCOSE 105* 86 80   < > 136* 114* 103*  BUN 27  48* 41*   < > 19 24* 29*  CREATININE 1.89* 2.40* 2.22*   < > 1.90* 1.85* 2.06*  CALCIUM 9.8 9.8 9.9   < > 8.9 8.9 9.1  GFRNONAA 25* 19* 20*   < > 26* 27* 24*  GFRAA 28* 21* 23*  --   --   --   --    < > = values in this interval not displayed.    CBC Recent Labs  Lab 05/14/21 0624  WBC 5.5  RBC 3.27*  HGB 9.8*  HCT 30.7*  PLT 158  MCV 93.9  MCH 30.0  MCHC 31.9  RDW 13.5    HEMOGLOBIN A1C Lab Results  Component Value Date   HGBA1C 6.1 (H) 05/11/2021   MPG 128 05/11/2021    Cardiac Panel (last 3 results) Results for KISSIE, DANT (MRN [id number]) as of 05/12/2021 12:10  Ref. Range 05/10/2021 19:19 05/10/2021 21:19 05/11/2021 04:38 05/11/2021 10:04  Troponin I (High Sensitivity) Latest Ref Range: <18 ng/L 75 (H) 88 (H) 131 (HH) 171 (HH)   BNP (  last 3 results) Recent Labs    08/26/20 0505 08/27/20 0818 05/11/21 0036  BNP 675.7* 424.0* 598.8*    TSH Recent Labs    08/22/20 1759  TSH 1.054    Lipid Panel     Component Value Date/Time   CHOL 130 08/29/2020 0250   TRIG 89 08/29/2020 0250   HDL 47 08/29/2020 0250   CHOLHDL 2.8 08/29/2020 0250   VLDL 18 08/29/2020 0250   LDLCALC 65 08/29/2020 0250     Hepatic Function Panel Recent Labs    08/22/20 1759 08/26/20 2304 09/25/20 1951 05/12/21 0250 05/13/21 0340 05/14/21 0624  PROT 7.2 6.5 6.6  --   --   --   ALBUMIN 2.8* 2.5* 3.5 3.1* 2.8* 3.0*  AST 190* 50* 20  --   --   --   ALT 66* 45* 12  --   --   --   ALKPHOS 79 53 55  --   --   --   BILITOT 0.5 0.9 0.4  --   --   --   BILIDIR  --  0.2  --   --   --   --   IBILI  --  0.7  --   --   --   --     Imaging: Chest x-ray 05/10/2021: 1. Mild cardiomegaly with interstitial edema and Kerley B lines. 2. Chronic elevation of the right hemidiaphragm. 3.  Aortic Atherosclerosis (ICD10-I70.0).   Cardiac Studies:  EKG 05/12/2021: Sinus rhythm 100 bpm Left atrial enlargement Left ventricular hypertrophy Old anteroseptal infarct  Coronary angiography  05/11/2021: LM: Normal LAD: Prox 60% stenosis        Mid 95% stenosis just prox to large bifurcating diag        Diag ostium does not seem to be involved.        (Prior cath in 08/2020 noted prox 80% stenosis, followed by long mid to apical LAD occlusion, which seems to have recanalized) LCx: No significant disease RCA: Ostial 90% stenosis         (Significant dampening of pressure noted)         RPL 60% stenosis RCA and LCx system seems largely unchanged compared to 08/2020  Two vessel CAD (Prox-mid LAD and ostial RCA) Elevated LVEDP 29 mmHg  Given acute HFrEF, recommend staged revascularization. Options include CABG (Grafts to LAD, Diag, and RCA) vs PCI to LAD and RCA. Recommend medical management of heart failure over the weekend, followed by revascularization.   Echocardiogram 05/11/2021: 1. Left ventricular ejection fraction, by estimation, is 45 to 50%. The  left ventricle has normal function. The left ventricle demonstrates  regional wall motion abnormalities (see scoring diagram/findings for  description). Left ventricular diastolic  function could not be evaluated. There is akinesis of the left  ventricular, mid-apical anterior wall and anteroseptal wall. There is  akinesis of the left ventricular, apical segment.  2. Right ventricular systolic function is normal. The right ventricular  size is normal.  3. Left atrial size was severely dilated.  4. The mitral valve is grossly normal. Mild mitral valve regurgitation.  5. The aortic valve is tricuspid. Aortic valve regurgitation is not  visualized.  6. The inferior vena cava is normal in size with <50% respiratory  variability, suggesting right atrial pressure of 8 mmHg.  7. Compared to previous study on 12/15/2020, LVEF is marginally lower from  50-55%. Wall motion abnormalities are more pronounced.   Assessment & Recommendations:  80 y.o. African-American female  with coronary artery disease (proximal LAD  occlusion, ostial RCA critical stenosis), history of post op MI after spinal surgery (08/2020), along with stroke-medically treated, CKD stage IV, type 2 diabetes mellitus, paroxysmal atrial fibrillation, admitted with NSTEMI, acute on chronic HFrEF  NSTEMI: Chest pain improved with IV nitroglycerin.  High-sensitivity troponin 75--->171 Two-vessel CAD with critical mid LAD and ostial RCA stenoses. Given her recurrent heart failure and non-STEMI presentation, recommend revascularization however medical therapy alone. Discussed options of CABG versus PCI.  Presented the following data to the patient.   STS short term risk calculator:       CIR risk with PCI: (Using Mehran score) Risk of Mortality: 8.810%                     CIN risk: 26%  Renal Failure: 17.538%                       risk of dialysis: 1.09% Permanent Stroke: 5.529%  Prolonged Ventilation: 24.192%            DSW Infection: 0.262% Reoperation: 2.640% Morbidity or Mortality: 39.211% Short Length of Stay: 8.193% Long Length of Stay: 26.387%  Syntax score I: 8 (low complexity) Syntax score II: PCI: 58: 4 year mortality risk: 54% CABG: 4 year mortality risk: 29% Syntax recommendation: CABG or PCI  Overall, I quoted better long-term prognosis with CABG, but better short-term prognosis with PCI.  Perioperative/periprocedural concerns are highest regarding her stroke risk and renal failure risk.  I also offered surgical evaluation.    Patient discussed with her daughter and son, prefers to undergo PCI.  Continue optimization of heart failure. Will tentatively plan for PCI to culprit LAD onTuesday 05/15/2021 morning. Will consider RCA PCI outpatient in future. Continue aspirin, heparin, statin, metoprolol.  Acute on chronic HFrEF: Secondary to ischemic cardiomyopathy. ContinueBiDil 20-37.5 mg twice daily, metoprolol succinate 50 mg daily. Not on ACE/ARB/Arni due to renal dysfunction. UOP 3.0 L/24 hrs.  Overall appears dry on  exam today. Will stop lasix.  Encourage PO hydration today. Will add gentle 50 cc/hr hydration late tonight in preparation for PCI on 6/7.  CKD stage IV: Creatinine improved and stabilized compared to May 2022. 2.04 today, about her baseline on admission.  Cr increased likely due to diuresis. Lasix stopped today.  Agree with holding losartan. Appreciate input from nephrologist Dr. Posey Pronto.  History of stroke: Cerebellar stroke in 08/2020. Patient reportedly had an episode of left-sided facial numbness a week ago, but denies any other recent acute stroke signs or symptoms. Brain MRI shows old right parietal and left cerebellar infarcts, no acute abnormality.     Nigel Mormon, MD Pager: [phone] Office: [phone]

## 2021-05-15 ENCOUNTER — Encounter (HOSPITAL_COMMUNITY): Payer: Self-pay | Admitting: Cardiology

## 2021-05-15 ENCOUNTER — Inpatient Hospital Stay (HOSPITAL_COMMUNITY)
Admission: EM | Disposition: A | Discharge status: Home / Self Care | Payer: Self-pay | Source: Home / Self Care | Attending: Internal Medicine

## 2021-05-15 DIAGNOSIS — N184 Chronic kidney disease, stage 4 (severe): Secondary | ICD-10-CM

## 2021-05-15 DIAGNOSIS — I10 Essential (primary) hypertension: Secondary | ICD-10-CM

## 2021-05-15 DIAGNOSIS — I214 Non-ST elevation (NSTEMI) myocardial infarction: Secondary | ICD-10-CM | POA: Diagnosis not present

## 2021-05-15 DIAGNOSIS — I200 Unstable angina: Secondary | ICD-10-CM | POA: Diagnosis not present

## 2021-05-15 DIAGNOSIS — R778 Other specified abnormalities of plasma proteins: Secondary | ICD-10-CM

## 2021-05-15 DIAGNOSIS — I5020 Unspecified systolic (congestive) heart failure: Secondary | ICD-10-CM | POA: Diagnosis not present

## 2021-05-15 HISTORY — PX: CORONARY STENT INTERVENTION: CATH118234

## 2021-05-15 LAB — CBC
HCT: 29.6 % — ABNORMAL LOW (ref 36.0–46.0)
Hemoglobin: 9.4 g/dL — ABNORMAL LOW (ref 12.0–15.0)
MCH: 30.1 pg (ref 26.0–34.0)
MCHC: 31.8 g/dL (ref 30.0–36.0)
MCV: 94.9 fL (ref 80.0–100.0)
Platelets: 171 10*3/uL (ref 150–400)
RBC: 3.12 MIL/uL — ABNORMAL LOW (ref 3.87–5.11)
RDW: 13.4 % (ref 11.5–15.5)
WBC: 6.1 10*3/uL (ref 4.0–10.5)
nRBC: 0 % (ref 0.0–0.2)

## 2021-05-15 LAB — POCT ACTIVATED CLOTTING TIME
Activated Clotting Time: 446 seconds
Activated Clotting Time: 261 seconds
Activated Clotting Time: 285 seconds
Activated Clotting Time: 300 seconds
Activated Clotting Time: 254 seconds
Activated Clotting Time: 190 seconds
Activated Clotting Time: 172 seconds

## 2021-05-15 LAB — RENAL FUNCTION PANEL
Albumin: 2.9 g/dL — ABNORMAL LOW (ref 3.5–5.0)
Anion gap: 6 (ref 5–15)
BUN: 27 mg/dL — ABNORMAL HIGH (ref 8–23)
CO2: 29 mmol/L (ref 22–32)
Calcium: 9.1 mg/dL (ref 8.9–10.3)
Chloride: 102 mmol/L (ref 98–111)
Creatinine, Ser: 1.73 mg/dL — ABNORMAL HIGH (ref 0.44–1.00)
GFR, Estimated: 30 mL/min — ABNORMAL LOW (ref >60)
Glucose, Bld: 107 mg/dL — ABNORMAL HIGH (ref 70–99)
Phosphorus: 3.1 mg/dL (ref 2.5–4.6)
Potassium: 4 mmol/L (ref 3.5–5.1)
Sodium: 137 mmol/L (ref 135–145)

## 2021-05-15 LAB — HEPARIN LEVEL (UNFRACTIONATED): Heparin Unfractionated: 0.7 IU/mL (ref 0.30–0.70)

## 2021-05-15 LAB — APTT: aPTT: 82 seconds — ABNORMAL HIGH (ref 24–36)

## 2021-05-15 SURGERY — CORONARY STENT INTERVENTION
Anesthesia: LOCAL

## 2021-05-15 MED ORDER — NITROGLYCERIN 1 MG/10 ML FOR IR/CATH LAB
INTRA_ARTERIAL | Status: AC
  Filled 2021-05-15: qty 10

## 2021-05-15 MED ORDER — HEPARIN (PORCINE) IN NACL 1000-0.9 UT/500ML-% IV SOLN
INTRAVENOUS | Status: AC
  Filled 2021-05-15: qty 500

## 2021-05-15 MED ORDER — LIDOCAINE HCL (PF) 1 % IJ SOLN
INTRAMUSCULAR | Status: AC
  Filled 2021-05-15: qty 30

## 2021-05-15 MED ORDER — HYDRALAZINE HCL 20 MG/ML IJ SOLN: 10.0000 mg | INTRAMUSCULAR | Status: AC | PRN

## 2021-05-15 MED ORDER — OXYCODONE HCL 5 MG PO TABS
5.0000 mg | ORAL_TABLET | ORAL | Status: DC | PRN
  Administered 2021-05-15: 5 mg via ORAL
  Filled 2021-05-15: qty 1

## 2021-05-15 MED ORDER — SODIUM CHLORIDE 0.9% FLUSH
3.0000 mL | Freq: Two times a day (BID) | INTRAVENOUS | Status: DC
  Administered 2021-05-16: 3 mL via INTRAVENOUS

## 2021-05-15 MED ORDER — HEPARIN (PORCINE) IN NACL 1000-0.9 UT/500ML-% IV SOLN
INTRAVENOUS | Status: AC
  Filled 2021-05-15: qty 1000

## 2021-05-15 MED ORDER — SODIUM CHLORIDE 0.9 % IV SOLN: 250.0000 mL | INTRAVENOUS | Status: DC | PRN

## 2021-05-15 MED ORDER — APIXABAN 2.5 MG PO TABS
2.5000 mg | ORAL_TABLET | Freq: Two times a day (BID) | ORAL | Status: DC
  Administered 2021-05-16: 2.5 mg via ORAL
  Filled 2021-05-15: qty 1

## 2021-05-15 MED ORDER — MIDAZOLAM HCL 2 MG/2ML IJ SOLN
INTRAMUSCULAR | Status: DC | PRN
  Administered 2021-05-15 (×2): 1 mg via INTRAVENOUS

## 2021-05-15 MED ORDER — HEPARIN SODIUM (PORCINE) 1000 UNIT/ML IJ SOLN
INTRAMUSCULAR | Status: DC | PRN
  Administered 2021-05-15 (×2): 3000 [IU] via INTRAVENOUS
  Administered 2021-05-15: 9000 [IU] via INTRAVENOUS

## 2021-05-15 MED ORDER — LABETALOL HCL 5 MG/ML IV SOLN
INTRAVENOUS | Status: DC | PRN
  Administered 2021-05-15 (×2): 10 mg via INTRAVENOUS

## 2021-05-15 MED ORDER — SODIUM CHLORIDE 0.9 % IV SOLN: INTRAVENOUS | Status: AC

## 2021-05-15 MED ORDER — HEPARIN SODIUM (PORCINE) 1000 UNIT/ML IJ SOLN
INTRAMUSCULAR | Status: AC
  Filled 2021-05-15: qty 1

## 2021-05-15 MED ORDER — MIDAZOLAM HCL 2 MG/2ML IJ SOLN
INTRAMUSCULAR | Status: AC
  Filled 2021-05-15: qty 2

## 2021-05-15 MED ORDER — ONDANSETRON HCL 4 MG/2ML IJ SOLN
4.0000 mg | Freq: Four times a day (QID) | INTRAMUSCULAR | Status: DC | PRN
Start: 2021-05-15 — End: 2021-05-16
  Administered 2021-05-15: 4 mg via INTRAVENOUS
  Filled 2021-05-15: qty 2

## 2021-05-15 MED ORDER — CLOPIDOGREL BISULFATE 75 MG PO TABS
75.0000 mg | ORAL_TABLET | Freq: Every day | ORAL | Status: DC
  Administered 2021-05-16: 75 mg via ORAL
  Filled 2021-05-15: qty 1

## 2021-05-15 MED ORDER — ACETAMINOPHEN 325 MG PO TABS: 650.0000 mg | ORAL_TABLET | ORAL | Status: DC | PRN

## 2021-05-15 MED ORDER — NITROGLYCERIN 1 MG/10 ML FOR IR/CATH LAB
INTRA_ARTERIAL | Status: DC | PRN
Start: 2021-05-15 — End: 2021-05-15
  Administered 2021-05-15: 200 ug via INTRACORONARY

## 2021-05-15 MED ORDER — IOHEXOL 350 MG/ML SOLN
INTRAVENOUS | Status: DC | PRN
  Administered 2021-05-15: 100 mL

## 2021-05-15 MED ORDER — SODIUM CHLORIDE 0.9% FLUSH: 3.0000 mL | INTRAVENOUS | Status: DC | PRN

## 2021-05-15 MED ORDER — FENTANYL CITRATE (PF) 100 MCG/2ML IJ SOLN
INTRAMUSCULAR | Status: AC
  Filled 2021-05-15: qty 2

## 2021-05-15 MED ORDER — LIDOCAINE HCL (PF) 1 % IJ SOLN
INTRAMUSCULAR | Status: DC | PRN
  Administered 2021-05-15: 20 mL via INTRADERMAL

## 2021-05-15 MED ORDER — LABETALOL HCL 5 MG/ML IV SOLN
INTRAVENOUS | Status: AC
  Filled 2021-05-15: qty 4

## 2021-05-15 MED ORDER — LABETALOL HCL 5 MG/ML IV SOLN: 10.0000 mg | INTRAVENOUS | Status: AC | PRN

## 2021-05-15 MED ORDER — CLOPIDOGREL BISULFATE 300 MG PO TABS
600.0000 mg | ORAL_TABLET | Freq: Once | ORAL | Status: AC
  Administered 2021-05-15: 600 mg via ORAL
  Filled 2021-05-15: qty 2

## 2021-05-15 MED ORDER — FENTANYL CITRATE (PF) 100 MCG/2ML IJ SOLN
INTRAMUSCULAR | Status: DC | PRN
  Administered 2021-05-15: 25 ug via INTRAVENOUS
  Administered 2021-05-15: 50 ug via INTRAVENOUS

## 2021-05-15 MED ORDER — SODIUM CHLORIDE 0.9 % IV SOLN
INTRAVENOUS | Status: AC | PRN
  Administered 2021-05-15: 75 mL/h via INTRAVENOUS

## 2021-05-15 MED FILL — Heparin Sodium (Porcine) Inj 1000 Unit/ML: INTRAMUSCULAR | Qty: 10 | Status: AC

## 2021-05-15 MED FILL — Verapamil HCl IV Soln 2.5 MG/ML: INTRAVENOUS | Qty: 2 | Status: AC

## 2021-05-15 MED FILL — Heparin Sod (Porcine)-NaCl IV Soln 1000 Unit/500ML-0.9%: INTRAVENOUS | Qty: 1000 | Status: AC

## 2021-05-15 SURGICAL SUPPLY — 36 items
BALLN EUPHORA RX 3.5X12 (BALLOONS)
BALLN SAPPHIRE 2.0X12 (BALLOONS) ×2
BALLN SAPPHIRE ~~LOC~~ 3.5X15 (BALLOONS) ×2 IMPLANT
BALLN SAPPHIRE ~~LOC~~ 4.0X15 (BALLOONS) ×2 IMPLANT
BALLN SCOREFLEX 2.75X15 (BALLOONS) ×2
BALLN SCOREFLEX 3.50X15 (BALLOONS) ×2
BALLOON EUPHORA RX 3.5X12 (BALLOONS) IMPLANT
BALLOON SAPPHIRE 2.0X12 (BALLOONS) ×1 IMPLANT
BALLOON SCOREFLEX 2.75X15 (BALLOONS) ×1 IMPLANT
BALLOON SCOREFLEX 3.50X15 (BALLOONS) ×1 IMPLANT
CATH LAUNCHER 6FR EBU3.5 (CATHETERS) ×2 IMPLANT
CATH OPTICROSS HD (CATHETERS) ×2 IMPLANT
CATH TELEPORT (CATHETERS) ×2 IMPLANT
CATH VENTURE RX (CATHETERS) ×2 IMPLANT
CATH VISTA GUIDE 6FR AL1 (CATHETERS) ×2 IMPLANT
ELECT DEFIB PAD ADLT CADENCE (PAD) ×2 IMPLANT
KIT ENCORE 26 ADVANTAGE (KITS) ×2 IMPLANT
KIT HEART LEFT (KITS) ×2 IMPLANT
KIT MICROPUNCTURE NIT STIFF (SHEATH) ×2 IMPLANT
PACK CARDIAC CATHETERIZATION (CUSTOM PROCEDURE TRAY) ×2 IMPLANT
SHEATH PINNACLE 6F 10CM (SHEATH) ×2 IMPLANT
SHEATH PROBE COVER 6X72 (BAG) ×2 IMPLANT
SLED PULL BACK IVUS (MISCELLANEOUS) ×2 IMPLANT
STENT SYNERGY XD 3.50X32 (Permanent Stent) ×1 IMPLANT
SYNERGY XD 3.50X32 (Permanent Stent) ×2 IMPLANT
TRANSDUCER W/STOPCOCK (MISCELLANEOUS) ×2 IMPLANT
TUBING CIL FLEX 10 FLL-RA (TUBING) ×2 IMPLANT
WIRE ASAHI FIELDER XT 190CM (WIRE) ×2 IMPLANT
WIRE ASAHI MIRACLEBROS-6 180CM (WIRE) ×2 IMPLANT
WIRE ASAHI PROWATER 180CM (WIRE) ×2 IMPLANT
WIRE COUGAR XT STRL 190CM (WIRE) ×2 IMPLANT
WIRE COUGAR XT STRL 300CM (WIRE) ×2 IMPLANT
WIRE FIGHTER CROSSING 190CM (WIRE) ×2 IMPLANT
WIRE GUIDE ASAHI EXTENSION 165 (WIRE) ×2 IMPLANT
WIRE HI TORQ BMW 190CM (WIRE) ×2 IMPLANT
WIRE HI TORQ WHISPER MS 190CM (WIRE) ×2 IMPLANT

## 2021-05-15 NOTE — Interval H&P Note (Signed)
History and Physical Interval Note:  05/15/2021 7:35 AM  Kristin [name]  has presented today for surgery, with the diagnosis of cad.  The various methods of treatment have been discussed with the patient and family. After consideration of risks, benefits and other options for treatment, the patient has consented to  Procedure(s): CORONARY STENT INTERVENTION (N/A) as a surgical intervention.  The patient's history has been reviewed, patient examined, no change in status, stable for surgery.  I have reviewed the patient's chart and labs.  Questions were answered to the patient's satisfaction.    2016 Appropriate Use Criteria for Coronary Revascularization in Patients With Acute Coronary Syndrome NSTEMI/UA High Risk (TIMI Score 5-7) NSTEMI/Unstable angina, stabilized patient at high risk Link Here: sistemancia.com Indication:  Revascularization by PCI or CABG of 1 or more arteries in a patient with NSTEMI or unstable angina with Stabilization after presentation High risk for clinical events  A (7) Indication: 16; Score 7   [name]

## 2021-05-15 NOTE — Progress Notes (Signed)
Patient ID: Kristin [name], female   DOB: 19-Jan-1941, 80 y.o.   MRN: [id number] S: Feels "terrible" following cath.  No CP/SOB O:BP 139/71 (BP Location: Left Arm)   Pulse 72   Temp 97.8 F (36.6 C) (Oral)   Resp 17   Ht _0  (1.626 m)   Wt 92.8 kg   SpO2 99%   BMI 35.10 kg/m   Intake/Output Summary (Last 24 hours) at 05/15/2021 1532 Last data filed at 05/15/2021 1300 Gross per 24 hour  Intake 2639.74 ml  Output 1800 ml  Net 839.74 ml   Intake/Output: I/O last 3 completed shifts: In: 2800.3 [P.O.:1500; I.V.:1300.3] Out: 3300 [Urine:3300]  Intake/Output this shift:  Total I/O In: 99.4 [P.O.:30; I.V.:69.4] Out: -  Weight change: 0.454 kg Gen: NAD CVS: RRR Resp: CTA Abd: benign Ext: no edema  Recent Labs  Lab 05/10/21 1919 05/11/21 1004 05/11/21 1818 05/12/21 0250 05/13/21 0340 05/14/21 0624 05/15/21 0315  NA 142 141 138 139 139 138 137  K 3.9 4.9 4.3 4.0 3.8 3.8 4.0  CL 107 110 103 103 106 101 102  CO2 _1 GLUCOSE 214* 129* 114* 136* 114* 103* 107*  BUN _2 24* 29* 27*  CREATININE 1.98* 2.04* 1.85* 1.90* 1.85* 2.06* 1.73*  ALBUMIN  --   --   --  3.1* 2.8* 3.0* 2.9*  CALCIUM 9.0 9.1 9.0 8.9 8.9 9.1 9.1  PHOS  --   --   --  3.5 3.3 3.5 3.1   Liver Function Tests: Recent Labs  Lab 05/13/21 0340 05/14/21 0624 05/15/21 0315  ALBUMIN 2.8* 3.0* 2.9*   No results for input(s): LIPASE, AMYLASE in the last 168 hours. No results for input(s): AMMONIA in the last 168 hours. CBC: Recent Labs  Lab 05/10/21 1919 05/12/21 0250 05/13/21 0340 05/14/21 0624 05/15/21 0315  WBC 8.0 10.3 6.7 5.5 6.1  HGB 10.5* 9.7* 9.6* 9.8* 9.4*  HCT 34.0* 30.2* 30.2* 30.7* 29.6*  MCV 98.0 95.6 95.3 93.9 94.9  PLT 166 138* 126* 158 171   Cardiac Enzymes: No results for input(s): CKTOTAL, CKMB, CKMBINDEX, TROPONINI in the last 168 hours. CBG: No results for input(s): GLUCAP in the last 168 hours.  Iron Studies: No results for input(s): IRON, TIBC,  TRANSFERRIN, FERRITIN in the last 72 hours. Studies/Results: CARDIAC CATHETERIZATION  Result Date: 05/15/2021 Prox-mid LAD 60-99% stenosis Successful percutaneous coronary intervention mid-prox LAD PTCA and stent placement 3.5 X 32 mm Synergy drug-eluting stent Proximal post dilatation using 4.0X15 mm balloon at 22 atm 99-->0% residual stenosis TIMI flow I-->III Patient is also on eliquis for Afib Recommend Aspirin, plavix, and eliquis for 1 month After that, discontinue Aspirin and continue plavix and eliquis for at least 1 year After 1 year, could consider stopping plavix Recommend symptoms guided management for ostial RCA stenosis Continue GDMT for HFrEF Nigel Mormon, MD Pager: [phone] Office: [phone]   . [START ON 05/16/2021] apixaban  2.5 mg Oral BID  . aspirin EC  81 mg Oral Daily  . [START ON 05/16/2021] clopidogrel  75 mg Oral Q breakfast  . isosorbide-hydrALAZINE  1 tablet Oral TID  . metoprolol succinate  50 mg Oral Daily  . rosuvastatin  20 mg Oral QHS  . sodium chloride flush  3 mL Intravenous Q12H  . sodium chloride flush  3 mL Intravenous Q12H  . traZODone  50 mg Oral QHS    BMET    Component Value Date/Time   NA  137 05/15/2021 0315   NA 143 05/03/2021 0930   K 4.0 05/15/2021 0315   CL 102 05/15/2021 0315   CO2 29 05/15/2021 0315   GLUCOSE 107 (H) 05/15/2021 0315   BUN 27 (H) 05/15/2021 0315   BUN 72 (H) 05/03/2021 0930   CREATININE 1.73 (H) 05/15/2021 0315   CALCIUM 9.1 05/15/2021 0315   GFRNONAA 30 (L) 05/15/2021 0315   GFRAA 23 (L) 01/26/2021 1040   CBC    Component Value Date/Time   WBC 6.1 05/15/2021 0315   RBC 3.12 (L) 05/15/2021 0315   HGB 9.4 (L) 05/15/2021 0315   HGB 11.4 03/13/2020 0949   HCT 29.6 (L) 05/15/2021 0315   HCT 36.5 03/13/2020 0949   PLT 171 05/15/2021 0315   PLT 425 03/13/2020 0949   MCV 94.9 05/15/2021 0315   MCV 95 03/13/2020 0949   MCH 30.1 05/15/2021 0315   MCHC 31.8 05/15/2021 0315   RDW 13.4 05/15/2021 0315   RDW  12.6 03/13/2020 0949   LYMPHSABS 2.0 09/25/2020 1951   LYMPHSABS 2.4 02/28/2020 1725   MONOABS 1.0 09/25/2020 1951   EOSABS 0.1 09/25/2020 1951   EOSABS 0.2 02/28/2020 1725   BASOSABS 0.0 09/25/2020 1951   BASOSABS 0.1 02/28/2020 1725     Assessment/Plan:  1. NSTEMI:  Improved with IV NTG and plan for PCI as she declined CABG. 1. S/p successful PCI of mid-prox LAD and stent placement with DES (99%->0%) 2. Aspirin, plavix, and eliquis for 1 month per Cardiology, then discontinue ASA and cont plavix and eliquis for 1 year. 2. CKD stage IV:  Presumably due to hypertensive nephrosclerosis.  Scr has been stable and at her baseline.  Continue to follow.  Will hold IV lasix given brisk diuresis and slight increase in BUN/Cr. 1. Scr improved to 1.73 but s/p PCI and stent placement so will need to follow Scr. 3. HTN:  Stable 4. Anemia of CKD stage IV:  Started on IV iron and follow.  May need ESA.   Donetta Potts, MD [employer] [phone]

## 2021-05-15 NOTE — Progress Notes (Signed)
PROGRESS NOTE    Kristin [name]  HYW:[id number] DOB: 1941-03-17 DOA: 05/10/2021 PCP: Caren Macadam, MD    Brief Narrative:  Kristin [name] is a 80 year old female with past medical history significant for CAD, paroxysmal atrial fibrillation, chronic diastolic congestive heart failure, CKD stage IV, essential hypertension who presented to [facility], ED on 6/2 with a chief complaint of chest pain.  Patient describes pain across her entire chest region with radiation to her back.  Patient with history of MI in September 2021.  In the ED, creatinine 1.98 with a BUN of 23.  CBC unremarkable.  Troponin elevated at 75>88. EKG with no dynamic changes noted.  Cardiology was consulted.  TRH consulted for further evaluation and management.  Assessment & Plan:   Principal Problem:   Chest pain Active Problems:   Hypertension   Non-ST elevation (NSTEMI) myocardial infarction (HCC)   Elevated troponin   CKD (chronic kidney disease) stage 4, GFR 15-29 ml/min (HCC)   HFrEF (heart failure with reduced ejection fraction) (HCC)   NSTEMI (non-ST elevated myocardial infarction) ([place])   NSTEMI CAD Patient presenting to the ED with chest pain.  Troponins mildly elevated.  EKG with no dynamic changes.  CT chest without contrast with no evidence of aortic aneurysm, no acute cardiopulmonary disease process.  Patient underwent left heart catheterization with findings of LAD proximal 60% stenosis, mid LAD 95% stenosis, RCA 90% stenosis.  Patient underwent PCI with DES to mid-proximal LAD on 6/7. --Continue aspirin, Plavix, Eliquis x1 month followed by Plavix and Eliquis x1 year --BiDil 20-37.5 mg p.o. 3 times daily --Metoprolol succinate 50 mg p.o. daily  Essential hypertension BP 136/73 this morning --Holding losartan secondary to CKD --Continue metoprolol succinate and BiDil --Continue monitor BP closely  CKD stage IV --Nephrology following, appreciate assistance --Cr 1.85>2.06>1.73 --repeat BMP  in am  Acute on chronic diastolic congestive heart failure She was initially started on IV furosemide which was discontinued due to increased creatinine. --net positive 1L past 24h and net negative 736m since admission --Holding home losartan --Continue metoprolol succinate 50 mg p.o. daily  History of CVA 2021 Patient reported heaviness of her head.  MRI brain negative for acute stroke, notable old right parietal and left cerebellar infarcts. --Aspirin 81 mg p.o. daily --Plavix 75 mg p.o. daily --Continue Crestor  Anemia of chronic renal disease Hemoglobin stable. S/p IV iron 6/4 and 6/7.   Aortic atherosclerosis Noted on CT chest 05/10/2021.  Continue statin and antiplatelets as above.    DVT prophylaxis: apixaban (ELIQUIS) tablet 2.5 mg Start: 05/16/21 1000 SCD's Start: 05/15/21 1153 SCD's Start: 05/11/21 1823 apixaban (ELIQUIS) tablet 2.5 mg    Code Status: Full Code Family Communication: None present at bedside  Disposition Plan:  Level of care: Progressive Status is: Inpatient  Remains inpatient appropriate because:Ongoing diagnostic testing needed not appropriate for outpatient work up, Unsafe d/c plan, IV treatments appropriate due to intensity of illness or inability to take PO and Inpatient level of care appropriate due to severity of illness   Dispo: The patient is from: Home              Anticipated d/c is to: Home              Patient currently is not medically stable to d/c.   Difficult to place patient No   Consultants:   Cardiology, Dr. PVirgina Jock Procedures:   Left heart catheterization 6/3  Left heart catheterization with PCI/DES 6/7  Antimicrobials:   None  Subjective: Patient seen examined bedside, resting comfortably.  Just returned back from Cath Lab after stent placement.  Complaining of pain to her back from lying on the Cath Lab table and complaining of unable to be able to move due to bed rest restrictions that she continues with  sheath in her groin.  No other specific complaints or concerns at this time.  Denies headache, no visual changes, no chest pain currently, no palpitations, no shortness of breath, no fever/chills/night sweats, no nausea/vomiting/diarrhea, no weakness, no fatigue, no paresthesias.  No acute events overnight per nursing staff.  Objective: Vitals:   05/15/21 1522 05/15/21 1527 05/15/21 1529 05/15/21 1542  BP: 117/76 122/77 115/74 107/70  Pulse:      Resp:      Temp:      TempSrc:      SpO2: 100% 98% 100% 99%  Weight:      Height:        Intake/Output Summary (Last 24 hours) at 05/15/2021 1705 Last data filed at 05/15/2021 1300 Gross per 24 hour  Intake 1399.74 ml  Output 1800 ml  Net -400.26 ml   Filed Weights   05/13/21 0511 05/14/21 0452 05/15/21 0533  Weight: 93.8 kg 92.3 kg 92.8 kg    Examination:  General exam: Appears calm and comfortable  Respiratory system: Clear to auscultation. Respiratory effort normal.  On room air Cardiovascular system: S1 & S2 heard, RRR. No JVD, murmurs, rubs, gallops or clicks. No pedal edema. Gastrointestinal system: Abdomen is nondistended, soft and nontender. No organomegaly or masses felt. Normal bowel sounds heard. Central nervous system: Alert and oriented. No focal neurological deficits. Extremities: Symmetric 5 x 5 power. Skin: No rashes, lesions or ulcers Psychiatry: Judgement and insight appear normal. Mood & affect appropriate.     Data Reviewed: I have personally reviewed following labs and imaging studies  CBC: Recent Labs  Lab 05/10/21 1919 05/12/21 0250 05/13/21 0340 05/14/21 0624 05/15/21 0315  WBC 8.0 10.3 6.7 5.5 6.1  HGB 10.5* 9.7* 9.6* 9.8* 9.4*  HCT 34.0* 30.2* 30.2* 30.7* 29.6*  MCV 98.0 95.6 95.3 93.9 94.9  PLT 166 138* 126* 158 149   Basic Metabolic Panel: Recent Labs  Lab 05/11/21 1818 05/12/21 0250 05/13/21 0340 05/14/21 0624 05/15/21 0315  NA 138 139 139 138 137  K 4.3 4.0 3.8 3.8 4.0  CL 103 103  106 101 102  CO2 _0 GLUCOSE 114* 136* 114* 103* 107*  BUN 18 19 24* 29* 27*  CREATININE 1.85* 1.90* 1.85* 2.06* 1.73*  CALCIUM 9.0 8.9 8.9 9.1 9.1  PHOS  --  3.5 3.3 3.5 3.1   GFR: Estimated Creatinine Clearance: 28.6 mL/min (A) (by C-G formula based on SCr of 1.73 mg/dL (H)). Liver Function Tests: Recent Labs  Lab 05/12/21 0250 05/13/21 0340 05/14/21 0624 05/15/21 0315  ALBUMIN 3.1* 2.8* 3.0* 2.9*   No results for input(s): LIPASE, AMYLASE in the last 168 hours. No results for input(s): AMMONIA in the last 168 hours. Coagulation Profile: No results for input(s): INR, PROTIME in the last 168 hours. Cardiac Enzymes: No results for input(s): CKTOTAL, CKMB, CKMBINDEX, TROPONINI in the last 168 hours. BNP (last 3 results) Recent Labs    01/05/21 0917 04/13/21 1010 04/30/21 1508  PROBNP 464 1,769* 536   HbA1C: No results for input(s): HGBA1C in the last 72 hours. CBG: No results for input(s): GLUCAP in the last 168 hours. Lipid Profile: No results for input(s): CHOL, HDL, LDLCALC, TRIG, CHOLHDL,  LDLDIRECT in the last 72 hours. Thyroid Function Tests: No results for input(s): TSH, T4TOTAL, FREET4, T3FREE, THYROIDAB in the last 72 hours. Anemia Panel: No results for input(s): VITAMINB12, FOLATE, FERRITIN, TIBC, IRON, RETICCTPCT in the last 72 hours. Sepsis Labs: No results for input(s): PROCALCITON, LATICACIDVEN in the last 168 hours.  Recent Results (from the past 240 hour(s))  Resp Panel by RT-PCR (Flu A&B, Covid) Nasopharyngeal Swab     Status: None   Collection Time: 05/11/21 12:36 AM   Specimen: Nasopharyngeal Swab; Nasopharyngeal(NP) swabs in vial transport medium  Result Value Ref Range Status   SARS Coronavirus 2 by RT PCR NEGATIVE NEGATIVE Final    Comment: (NOTE) SARS-CoV-2 target nucleic acids are NOT DETECTED.  The SARS-CoV-2 RNA is generally detectable in upper respiratory specimens during the acute phase of infection. The  lowest concentration of SARS-CoV-2 viral copies this assay can detect is 138 copies/mL. A negative result does not preclude SARS-Cov-2 infection and should not be used as the sole basis for treatment or other patient management decisions. A negative result may occur with  improper specimen collection/handling, submission of specimen other than nasopharyngeal swab, presence of viral mutation(s) within the areas targeted by this assay, and inadequate number of viral copies(<138 copies/mL). A negative result must be combined with clinical observations, patient history, and epidemiological information. The expected result is Negative.  Fact Sheet for Patients:  EntrepreneurPulse.com.au  Fact Sheet for Healthcare Providers:  IncredibleEmployment.be  This test is no t yet approved or cleared by the [place] FDA and  has been authorized for detection and/or diagnosis of SARS-CoV-2 by FDA under an Emergency Use Authorization (EUA). This EUA will remain  in effect (meaning this test can be used) for the duration of the COVID-19 declaration under Section 564(b)(1) of the Act, 21 U.S.C.section 360bbb-3(b)(1), unless the authorization is terminated  or revoked sooner.       Influenza A by PCR NEGATIVE NEGATIVE Final   Influenza B by PCR NEGATIVE NEGATIVE Final    Comment: (NOTE) The Xpert Xpress SARS-CoV-2/FLU/RSV plus assay is intended as an aid in the diagnosis of influenza from Nasopharyngeal swab specimens and should not be used as a sole basis for treatment. Nasal washings and aspirates are unacceptable for Xpert Xpress SARS-CoV-2/FLU/RSV testing.  Fact Sheet for Patients: EntrepreneurPulse.com.au  Fact Sheet for Healthcare Providers: IncredibleEmployment.be  This test is not yet approved or cleared by the [place] FDA and has been authorized for detection and/or diagnosis of SARS-CoV-2 by FDA under  an Emergency Use Authorization (EUA). This EUA will remain in effect (meaning this test can be used) for the duration of the COVID-19 declaration under Section 564(b)(1) of the Act, 21 U.S.C. section 360bbb-3(b)(1), unless the authorization is terminated or revoked.  Performed at [facility], [place] [address] [place], [place] [address]          Radiology Studies: CARDIAC CATHETERIZATION  Result Date: 05/15/2021 Prox-mid LAD 60-99% stenosis Successful percutaneous coronary intervention mid-prox LAD PTCA and stent placement 3.5 X 32 mm Synergy drug-eluting stent Proximal post dilatation using 4.0X15 mm balloon at 22 atm 99-->0% residual stenosis TIMI flow I-->III Patient is also on eliquis for Afib Recommend Aspirin, plavix, and eliquis for 1 month After that, discontinue Aspirin and continue plavix and eliquis for at least 1 year After 1 year, could consider stopping plavix Recommend symptoms guided management for ostial RCA stenosis Continue GDMT for HFrEF Nigel Mormon, MD Pager: [phone] Office: [phone]  Scheduled Meds: . [START ON 05/16/2021] apixaban  2.5 mg Oral BID  . aspirin EC  81 mg Oral Daily  . [START ON 05/16/2021] clopidogrel  75 mg Oral Q breakfast  . isosorbide-hydrALAZINE  1 tablet Oral TID  . metoprolol succinate  50 mg Oral Daily  . rosuvastatin  20 mg Oral QHS  . sodium chloride flush  3 mL Intravenous Q12H  . sodium chloride flush  3 mL Intravenous Q12H  . traZODone  50 mg Oral QHS   Continuous Infusions: . sodium chloride    . sodium chloride 75 mL/hr at 05/15/21 1300  . sodium chloride    . nitroGLYCERIN 40 mcg/min (05/14/21 2310)     LOS: 4 days    Time spent: 43 minutes spent on chart review, discussion with nursing staff, consultants, updating family and interview/physical exam; more than 50% of that time was spent in counseling and/or coordination of care.    Beanca Kiester J [place] DO [facility] Available via Epic  secure chat 7am-7pm After these hours, please refer to coverage provider listed on [email or web address] 05/15/2021, 5:05 PM

## 2021-05-15 NOTE — Progress Notes (Signed)
Site area: right groin  Site Prior to Removal:  Level 0  Pressure Applied For 20 MINUTES    Minutes Beginning at 1508  Manual:   Yes.    Patient Status During Pull:  stable  Post Pull Groin Site:  Level 0  Post Pull Instructions Given:  Yes.    Post Pull Pulses Present:  Yes.    Dressing Applied:  Yes.    Comments:  Bedrest started at 1528

## 2021-05-16 DIAGNOSIS — Z955 Presence of coronary angioplasty implant and graft: Secondary | ICD-10-CM | POA: Diagnosis not present

## 2021-05-16 DIAGNOSIS — I2510 Atherosclerotic heart disease of native coronary artery without angina pectoris: Secondary | ICD-10-CM | POA: Diagnosis not present

## 2021-05-16 DIAGNOSIS — R778 Other specified abnormalities of plasma proteins: Secondary | ICD-10-CM | POA: Diagnosis not present

## 2021-05-16 DIAGNOSIS — I208 Other forms of angina pectoris: Secondary | ICD-10-CM

## 2021-05-16 DIAGNOSIS — I5020 Unspecified systolic (congestive) heart failure: Secondary | ICD-10-CM | POA: Diagnosis not present

## 2021-05-16 DIAGNOSIS — I214 Non-ST elevation (NSTEMI) myocardial infarction: Secondary | ICD-10-CM | POA: Diagnosis not present

## 2021-05-16 DIAGNOSIS — N184 Chronic kidney disease, stage 4 (severe): Secondary | ICD-10-CM | POA: Diagnosis not present

## 2021-05-16 LAB — CBC
HCT: 27.1 % — ABNORMAL LOW (ref 36.0–46.0)
Hemoglobin: 8.7 g/dL — ABNORMAL LOW (ref 12.0–15.0)
MCH: 30.9 pg (ref 26.0–34.0)
MCHC: 32.1 g/dL (ref 30.0–36.0)
MCV: 96.1 fL (ref 80.0–100.0)
Platelets: 158 10*3/uL (ref 150–400)
RBC: 2.82 MIL/uL — ABNORMAL LOW (ref 3.87–5.11)
RDW: 13.4 % (ref 11.5–15.5)
WBC: 6.3 10*3/uL (ref 4.0–10.5)
nRBC: 0 % (ref 0.0–0.2)

## 2021-05-16 LAB — RENAL FUNCTION PANEL
Albumin: 2.7 g/dL — ABNORMAL LOW (ref 3.5–5.0)
Anion gap: 6 (ref 5–15)
BUN: 18 mg/dL (ref 8–23)
CO2: 25 mmol/L (ref 22–32)
Calcium: 8.9 mg/dL (ref 8.9–10.3)
Chloride: 106 mmol/L (ref 98–111)
Creatinine, Ser: 1.61 mg/dL — ABNORMAL HIGH (ref 0.44–1.00)
GFR, Estimated: 32 mL/min — ABNORMAL LOW (ref >60)
Glucose, Bld: 91 mg/dL (ref 70–99)
Phosphorus: 4.1 mg/dL (ref 2.5–4.6)
Potassium: 4.3 mmol/L (ref 3.5–5.1)
Sodium: 137 mmol/L (ref 135–145)

## 2021-05-16 LAB — BRAIN NATRIURETIC PEPTIDE: B Natriuretic Peptide: 305.1 pg/mL — ABNORMAL HIGH (ref 0.0–100.0)

## 2021-05-16 MED ORDER — ISOSORB DINITRATE-HYDRALAZINE 20-37.5 MG PO TABS: 1.0000 | ORAL_TABLET | Freq: Three times a day (TID) | ORAL | 0 refills | Status: DC

## 2021-05-16 MED ORDER — ASPIRIN EC 81 MG PO TBEC: 81.0000 mg | DELAYED_RELEASE_TABLET | Freq: Every day | ORAL | 0 refills | Status: AC

## 2021-05-16 MED ORDER — CLOPIDOGREL BISULFATE 75 MG PO TABS: 75.0000 mg | ORAL_TABLET | Freq: Every day | ORAL | 0 refills | Status: AC

## 2021-05-16 MED FILL — Heparin Sod (Porcine)-NaCl IV Soln 1000 Unit/500ML-0.9%: INTRAVENOUS | Qty: 1000 | Status: AC

## 2021-05-16 NOTE — Discharge Summary (Addendum)
Physician Discharge Summary  Kristin [name] VVK:[id number] DOB: 04/03/1941 DOA: 05/10/2021  PCP: Caren Macadam, MD  Admit date: 05/10/2021 Discharge date: 05/16/2021  Admitted From: Home Disposition: Home  Recommendations for Outpatient Follow-up:  1. Follow up with PCP in 1-2 weeks 2. Follow-up with cardiology, Dr. Terri Skains on 05/23/2021 at 11:45 AM 3. Holding losartan and Bumex until follows up with cardiology 4. Continue aspirin/Plavix/Eliquis x1 month followed by Plavix/Eliquis alone for 1 year 5. Please obtain BMP in one week  Home Health: No Equipment/Devices: None  Discharge Condition: Stable CODE STATUS: Full code Diet recommendation: Heart healthy diet  History of present illness:  Kristin [name] is a 80 year old female with past medical history significant for CAD, paroxysmal atrial fibrillation, chronic diastolic congestive heart failure, CKD stage IV, essential hypertension who presented to [facility], ED on 6/2 with a chief complaint of chest pain.  Patient describes pain across her entire chest region with radiation to her back.  Patient with history of MI in September 2021.  In the ED, creatinine 1.98 with a BUN of 23.  CBC unremarkable.  Troponin elevated at 75>88. EKG with no dynamic changes noted.  Cardiology was consulted.  TRH consulted for further evaluation and management.  Hospital course:  NSTEMI CAD Patient presenting to the ED with chest pain.  Troponins mildly elevated.  EKG with no dynamic changes.  CT chest without contrast with no evidence of aortic aneurysm, no acute cardiopulmonary disease process.  Patient underwent left heart catheterization with findings of LAD proximal 60% stenosis, mid LAD 95% stenosis, RCA 90% stenosis.  Patient underwent PCI with DES to mid-proximal LAD on 6/7. Continue aspirin, Plavix, Eliquis x1 month followed by Plavix and Eliquis x1 year. Metoprolol succinate 50 mg p.o. daily, hydralazine 50 mg p.o. 3 times daily, isosorbide  mononitrate 45 mg p.o. 3 times daily..  Outpatient follow-up with cardiology, Dr. Terri Skains on 6/15.  Essential hypertension Holding home losartan and Bumex in the follows up with cardiology.  Continue metoprolol succinate, hydralazine, isosorbide mononitrate.  CKD stage IV Creatinine 1.61 at time of discharge.  Repeat BMP 1 week.  Outpatient follow-up with nephrology.  Acute on chronic diastolic congestive heart failure She was initially started on IV furosemide which was discontinued due to increased creatinine.  Continue to hold home Bumex and losartan until follows up with cardiology.  Continue. Continue metoprolol succinate 50 mg p.o. daily  History of CVA 2021 Patient reported heaviness of her head.  MRI brain negative for acute stroke, notable old right parietal and left cerebellar infarcts. Aspirin 81 mg p.o. daily, Plavix 75 mg p.o. daily, Continue Crestor.  Anemia of chronic renal disease Hemoglobin stable. S/p IV iron 6/4 and 6/7.   Aortic atherosclerosis Noted on CT chest 05/10/2021.  Continue statin and antiplatelets as above.  Discharge Diagnoses:  Active Problems:   Hypertension   CKD (chronic kidney disease) stage 4, GFR 15-29 ml/min (HCC)   HFrEF (heart failure with reduced ejection fraction) (HCC)   Status post coronary artery stent placement    Discharge Instructions  Discharge Instructions    Amb Referral to Cardiac Rehabilitation   Complete by: As directed    Diagnosis:  Coronary Stents NSTEMI PTCA     After initial evaluation and assessments completed: Virtual Based Care may be provided alone or in conjunction with Phase 2 Cardiac Rehab based on patient barriers.: Yes   Call MD for:  difficulty breathing, headache or visual disturbances   Complete by: As directed    Call MD  for:  extreme fatigue   Complete by: As directed    Call MD for:  persistant dizziness or light-headedness   Complete by: As directed    Call MD for:  persistant nausea and  vomiting   Complete by: As directed    Call MD for:  severe uncontrolled pain   Complete by: As directed    Call MD for:  temperature >100.4   Complete by: As directed    Diet - low sodium heart healthy   Complete by: As directed    Increase activity slowly   Complete by: As directed      Allergies as of 05/16/2021   No Known Allergies     Medication List    STOP taking these medications   bumetanide 1 MG tablet Commonly known as: BUMEX   losartan 25 MG tablet Commonly known as: COZAAR   Misc. Devices Misc     TAKE these medications   aspirin EC 81 MG tablet Take 1 tablet (81 mg total) by mouth daily. Swallow whole.   cholecalciferol 25 MCG (1000 UNIT) tablet Commonly known as: VITAMIN D Take 3,000 Units by mouth daily.   clopidogrel 75 MG tablet Commonly known as: PLAVIX Take 1 tablet (75 mg total) by mouth daily with breakfast. Start taking on: May 17, 2021   colchicine 0.6 MG tablet Take 0.5 tablets (0.3 mg total) by mouth daily.   diclofenac Sodium 1 % Gel Commonly known as: VOLTAREN Apply 2 g topically 4 (four) times daily.   Eliquis 2.5 MG Tabs tablet Generic drug: apixaban Take 1 tablet by mouth twice daily What changed: how much to take   gabapentin 100 MG capsule Commonly known as: NEURONTIN Take 200 mg by mouth at bedtime.   hydrALAZINE 50 MG tablet Commonly known as: APRESOLINE Take 1.5 tablets (75 mg total) by mouth 3 (three) times daily.   isosorbide dinitrate 30 MG tablet Commonly known as: ISORDIL Take 1.5 tablets (45 mg total) by mouth 3 (three) times daily.   methocarbamol 500 MG tablet Commonly known as: ROBAXIN Take 1 tablet (500 mg total) by mouth every 6 (six) hours as needed for muscle spasms.   metoprolol succinate 50 MG 24 hr tablet Commonly known as: TOPROL-XL Take 50 mg by mouth daily.   nitroGLYCERIN 0.4 MG SL tablet Commonly known as: NITROSTAT Place 1 tablet (0.4 mg total) under the tongue every 5 (five) minutes as  needed for chest pain.   pantoprazole 40 MG tablet Commonly known as: PROTONIX Take 1 tablet (40 mg total) by mouth daily.   rosuvastatin 20 MG tablet Commonly known as: CRESTOR Take 1 tablet (20 mg total) by mouth at bedtime.   traZODone 50 MG tablet Commonly known as: DESYREL Take 1 tablet by mouth at bedtime.       Follow-up Information    Rex Kras, DO Follow up on 05/23/2021.   Specialties: Cardiology, Radiology, Vascular Surgery Why: 11:45am Contact information: [place] [address] [phone]        Caren Macadam, MD. Schedule an appointment as soon as possible for a visit in 1 week(s).   Specialty: Family Medicine Contact information: [place] [address] [phone]              No Known Allergies  Consultations:  Cardiology, Dr. Virgina Jock, Dr. Terri Skains   Procedures/Studies: CT HEAD WO CONTRAST  Result Date: 05/11/2021 CLINICAL DATA:  Dizziness with nonspecific headache.  Slight nausea. EXAM:  CT HEAD WITHOUT CONTRAST TECHNIQUE: Contiguous axial images were obtained from the base of the skull through the vertex without intravenous contrast. COMPARISON:  CT head 08/27/2020.  MRI head 08/28/2020. FINDINGS: Brain: There is no evidence of acute intracranial hemorrhage, mass lesion, brain edema or extra-axial fluid collection. There is mild atrophy with mild prominence of the ventricles and subarachnoid spaces. Interval evolution of previously demonstrated infarcts in the right parietal lobe and left superior cerebellum. There is no CT evidence of acute cortical infarction. Patchy small vessel ischemic changes throughout the periventricular white matter. Vascular: Intracranial vascular calcifications. No hyperdense vessel identified. Skull: Negative for fracture or focal lesion. Sinuses/Orbits: The visualized paranasal sinuses and mastoid air cells are clear. No orbital abnormalities are seen. Other: Previous  bilateral lens surgery. IMPRESSION: 1. No evidence of acute intracranial process. 2. 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 No CT evidence of acute infarct. Electronically Signed   By: Richardean Sale M.D.   On: 05/11/2021 11:33   CT Chest Wo Contrast  Result Date: 05/10/2021 CLINICAL DATA:  Chest pain, back pain EXAM: CT CHEST WITHOUT CONTRAST TECHNIQUE: Multidetector CT imaging of the chest was performed following the standard protocol without IV contrast. COMPARISON:  None. FINDINGS: Cardiovascular: Heart is normal size. Moderate coronary artery and aortic calcifications. No evidence of aortic aneurysm. Mediastinum/Nodes: No mediastinal, hilar, or axillary adenopathy. Trachea and esophagus are unremarkable. Thyroid unremarkable. Lungs/Pleura: Linear areas of scarring in the lungs bilaterally. No confluent opacities or effusions. Upper Abdomen: Imaging into the upper abdomen demonstrates no acute findings. Musculoskeletal: Chest wall soft tissues are unremarkable. No acute bony abnormality. IMPRESSION: No evidence of aortic aneurysm. No acute cardiopulmonary disease. Aortic Atherosclerosis (ICD10-I70.0). Electronically Signed   By: Rolm Baptise M.D.   On: 05/10/2021 21:32   MR BRAIN WO CONTRAST  Result Date: 05/13/2021 CLINICAL DATA:  Dizziness EXAM: MRI HEAD WITHOUT CONTRAST TECHNIQUE: Multiplanar, multiecho pulse sequences of the brain and surrounding structures were obtained without intravenous contrast. COMPARISON:  None. FINDINGS: Brain: No acute infarct, mass effect or extra-axial collection. Widespread magnetic susceptibility effect suggests prior iron infusion. There is multifocal hyperintense T2-weighted signal within the white matter. Parenchymal volume and CSF spaces are normal. Old right parietal and left cerebellar infarcts. No acute hemorrhage. The midline structures are normal. Vascular: Major flow voids are preserved. Skull and  upper cervical spine: Normal calvarium and skull base. Visualized upper cervical spine and soft tissues are normal. Sinuses/Orbits:No paranasal sinus fluid levels or advanced mucosal thickening. No mastoid or middle ear effusion. Normal orbits. IMPRESSION: 1. No acute intracranial abnormality. 2. Old right parietal and left cerebellar infarcts. Electronically Signed   By: Ulyses Jarred M.D.   On: 05/13/2021 00:12   CARDIAC CATHETERIZATION  Result Date: 05/15/2021 Prox-mid LAD 60-99% stenosis Successful percutaneous coronary intervention mid-prox LAD PTCA and stent placement 3.5 X 32 mm Synergy drug-eluting stent Proximal post dilatation using 4.0X15 mm balloon at 22 atm 99-->0% residual stenosis TIMI flow I-->III Patient is also on eliquis for Afib Recommend Aspirin, plavix, and eliquis for 1 month After that, discontinue Aspirin and continue plavix and eliquis for at least 1 year After 1 year, could consider stopping plavix Recommend symptoms guided management for ostial RCA stenosis Continue GDMT for HFrEF Nigel Mormon, MD Pager: [phone] Office: [phone]   CARDIAC CATHETERIZATION  Result Date: 05/11/2021 LM: Normal LAD: Prox 60% stenosis        Mid 95% stenosis just prox to large bifurcating  diag        Diag ostium does not seem to be involved.        (Prior cath in 08/2020 noted prox 80% stenosis, followed by long mid to apical LAD occlusion, which seems to have recanalized) LCx: No significant disease RCA: Ostial 90% stenosis         (Significant dampening of pressure noted)         RPL 60% stenosis RCA and LCx system seems largely unchanged compared to 08/2020 Two vessel CAD (Prox-mid LAD and ostial RCA) Elevated LVEDP 29 mmHg Given acute HFrEF, recommend staged revascularization. Options include CABG (Grafts to LAD, Diag, and RCA) vs PCI to LAD and RCA. Recommend medical management of heart failure over the weekend, followed by revascularization. Nigel Mormon, MD Pager: [phone]  Office: [phone]  DG Chest Port 1 View  Result Date: 05/10/2021 CLINICAL DATA:  Chest pain EXAM: PORTABLE CHEST 1 VIEW COMPARISON:  03/16/2021 FINDINGS: Mildly elevated right hemidiaphragm. Mild enlargement of the cardiopericardial silhouette, with Kerley B lines and a peripheral interstitial accentuation favoring interstitial pulmonary edema. No overt airspace edema is currently identified. Atherosclerotic calcification of the aortic arch. No blunting of the costophrenic angles. Thoracic spondylosis. IMPRESSION: 1. Mild cardiomegaly with interstitial edema and Kerley B lines. 2. Chronic elevation of the right hemidiaphragm. 3.  Aortic Atherosclerosis (ICD10-I70.0). Electronically Signed   By: Van Clines M.D.   On: 05/10/2021 19:50   ECHOCARDIOGRAM COMPLETE  Result Date: 05/11/2021    ECHOCARDIOGRAM REPORT   Patient Name:   Kristin [name] Date of Exam: 05/11/2021 Medical Rec #:  [id number]        Height:       64.0 in Accession #:    [id number]       Weight:       210.3 lb Date of Birth:  [birth year]         BSA:          1.999 m Patient Age:    65 years         BP:           143/93 mmHg Patient Gender: F                HR:           75 bpm. Exam Location:  Inpatient Procedure: 2D Echo, Cardiac Doppler, Color Doppler and Intracardiac            Opacification Agent Indications:     R07.9* Chest pain, unspecified  History:         Patient has prior history of Echocardiogram examinations, most                  recent 12/15/2020. CAD; Risk Factors:Hypertension.  Sonographer:     Merrie Roof RDCS Referring Phys:  [id number] [facility] PATWARDHAN Diagnosing Phys: Vernell Leep MD IMPRESSIONS  1. Left ventricular ejection fraction, by estimation, is 45 to 50%. The left ventricle has normal function. The left ventricle demonstrates regional wall motion abnormalities (see scoring diagram/findings for description). Left ventricular diastolic function could not be evaluated. There is akinesis of the left  ventricular, mid-apical anterior wall and anteroseptal wall. There is akinesis of the left ventricular, apical segment.  2. Right ventricular systolic function is normal. The right ventricular size is normal.  3. Left atrial size was severely dilated.  4. The mitral valve is grossly normal. Mild mitral valve regurgitation.  5. The aortic valve is tricuspid. Aortic valve regurgitation is  not visualized.  6. The inferior vena cava is normal in size with <50% respiratory variability, suggesting right atrial pressure of 8 mmHg.  7. Compared to previous study on 12/15/2020, LVEF is marginally lower from 50-55%. Wall motion abnormalities are more pronounced. FINDINGS  Left Ventricle: Inadequate Doppler data to evaluate diastolic function. Left ventricular ejection fraction, by estimation, is 45 to 50%. The left ventricle has normal function. The left ventricle demonstrates regional wall motion abnormalities. Definity  contrast agent was given IV to delineate the left ventricular endocardial borders. The left ventricular internal cavity size was normal in size. There is no left ventricular hypertrophy. Left ventricular diastolic function could not be evaluated. Right Ventricle: The right ventricular size is normal. No increase in right ventricular wall thickness. Right ventricular systolic function is normal. Left Atrium: Left atrial size was severely dilated. Right Atrium: Right atrial size was normal in size. Pericardium: There is no evidence of pericardial effusion. Mitral Valve: The mitral valve is grossly normal. Mild mitral valve regurgitation. Tricuspid Valve: The tricuspid valve is grossly normal. Tricuspid valve regurgitation is mild. Aortic Valve: The aortic valve is tricuspid. Aortic valve regurgitation is not visualized. Aortic valve mean gradient measures 4.0 mmHg. Aortic valve peak gradient measures 7.8 mmHg. Aortic valve area, by VTI measures 2.23 cm. Pulmonic Valve: The pulmonic valve was grossly normal.  Pulmonic valve regurgitation is mild. Aorta: The aortic root and ascending aorta are structurally normal, with no evidence of dilitation. Venous: The inferior vena cava is normal in size with less than 50% respiratory variability, suggesting right atrial pressure of 8 mmHg. IAS/Shunts: The interatrial septum is aneurysmal. No atrial level shunt detected by color flow Doppler.  LEFT VENTRICLE PLAX 2D LVIDd:         4.70 cm LVIDs:         3.50 cm LV PW:         1.00 cm LV IVS:        1.00 cm LVOT diam:     2.00 cm LV SV:         61 LV SV Index:   30 LVOT Area:     3.14 cm  LV Volumes (MOD) LV vol d, MOD A2C: 115.0 ml LV vol d, MOD A4C: 107.0 ml LV vol s, MOD A2C: 75.4 ml LV vol s, MOD A4C: 54.9 ml LV SV MOD A2C:     39.6 ml LV SV MOD A4C:     107.0 ml LV SV MOD BP:      46.4 ml RIGHT VENTRICLE          IVC RV Basal diam:  3.30 cm  IVC diam: 2.30 cm LEFT ATRIUM              Index       RIGHT ATRIUM           Index LA diam:        4.00 cm  2.00 cm/m  RA Area:     15.10 cm LA Vol (A2C):   120.0 ml 60.04 ml/m RA Volume:   41.00 ml  20.51 ml/m LA Vol (A4C):   87.6 ml  43.83 ml/m LA Biplane Vol: 102.0 ml 51.04 ml/m  AORTIC VALVE AV Area (Vmax):    2.24 cm AV Area (Vmean):   2.09 cm AV Area (VTI):     2.23 cm AV Vmax:           140.00 cm/s AV Vmean:  93.600 cm/s AV VTI:            0.273 m AV Peak Grad:      7.8 mmHg AV Mean Grad:      4.0 mmHg LVOT Vmax:         99.80 cm/s LVOT Vmean:        62.300 cm/s LVOT VTI:          0.194 m LVOT/AV VTI ratio: 0.71  AORTA Ao Root diam: 3.20 cm  SHUNTS Systemic VTI:  0.19 m Systemic Diam: 2.00 cm Vernell Leep MD Electronically signed by Vernell Leep MD Signature Date/Time: 05/11/2021/12:56:05 PM    Final      Subjective: Patient seen examined bedside, resting comfortably.  No complaints this morning.  Seen by cardiology and okay for discharge home.  Has outpatient follow-up in roughly 1 week.  No other questions or concerns at this time.  Denies headache,  no dizziness, no nausea/vomiting/diarrhea, no fever/chills/night sweats, no chest pain, no palpitations, no shortness of breath, no abdominal pain, no weakness, no fatigue, no paresthesias.  No acute events overnight per nursing staff.  Discharge Exam: Vitals:   05/15/21 2121 05/16/21 0432  BP: (!) 104/55 (!) 117/58  Pulse: 74 68  Resp:  16  Temp:  97.9 F (36.6 C)  SpO2:  100%   Vitals:   05/15/21 2049 05/15/21 2121 05/16/21 0432 05/16/21 0508  BP: (!) 97/57 (!) 104/55 (!) 117/58   Pulse: 72 74 68   Resp: 17  16   Temp: 98.4 F (36.9 C)  97.9 F (36.6 C)   TempSrc: Oral  Oral   SpO2: 96%  100%   Weight:    92.8 kg  Height:        General: Pt is alert, awake, not in acute distress Cardiovascular: RRR, S1/S2 +, no rubs, no gallops Respiratory: CTA bilaterally, no wheezing, no rhonchi, on room air Abdominal: Soft, NT, ND, bowel sounds + Extremities: no edema, no cyanosis    The results of significant diagnostics from this hospitalization (including imaging, microbiology, ancillary and laboratory) are listed below for reference.     Microbiology: Recent Results (from the past 240 hour(s))  Resp Panel by RT-PCR (Flu A&B, Covid) Nasopharyngeal Swab     Status: None   Collection Time: 05/11/21 12:36 AM   Specimen: Nasopharyngeal Swab; Nasopharyngeal(NP) swabs in vial transport medium  Result Value Ref Range Status   SARS Coronavirus 2 by RT PCR NEGATIVE NEGATIVE Final    Comment: (NOTE) SARS-CoV-2 target nucleic acids are NOT DETECTED.  The SARS-CoV-2 RNA is generally detectable in upper respiratory specimens during the acute phase of infection. The lowest concentration of SARS-CoV-2 viral copies this assay can detect is 138 copies/mL. A negative result does not preclude SARS-Cov-2 infection and should not be used as the sole basis for treatment or other patient management decisions. A negative result may occur with  improper specimen collection/handling, submission of  specimen other than nasopharyngeal swab, presence of viral mutation(s) within the areas targeted by this assay, and inadequate number of viral copies(<138 copies/mL). A negative result must be combined with clinical observations, patient history, and epidemiological information. The expected result is Negative.  Fact Sheet for Patients:  EntrepreneurPulse.com.au  Fact Sheet for Healthcare Providers:  IncredibleEmployment.be  This test is no t yet approved or cleared by the [place] FDA and  has been authorized for detection and/or diagnosis of SARS-CoV-2 by FDA under an Emergency Use Authorization (EUA). This EUA will remain  in effect (meaning this test can be used) for the duration of the COVID-19 declaration under Section 564(b)(1) of the Act, 21 U.S.C.section 360bbb-3(b)(1), unless the authorization is terminated  or revoked sooner.       Influenza A by PCR NEGATIVE NEGATIVE Final   Influenza B by PCR NEGATIVE NEGATIVE Final    Comment: (NOTE) The Xpert Xpress SARS-CoV-2/FLU/RSV plus assay is intended as an aid in the diagnosis of influenza from Nasopharyngeal swab specimens and should not be used as a sole basis for treatment. Nasal washings and aspirates are unacceptable for Xpert Xpress SARS-CoV-2/FLU/RSV testing.  Fact Sheet for Patients: EntrepreneurPulse.com.au  Fact Sheet for Healthcare Providers: IncredibleEmployment.be  This test is not yet approved or cleared by the [place] FDA and has been authorized for detection and/or diagnosis of SARS-CoV-2 by FDA under an Emergency Use Authorization (EUA). This EUA will remain in effect (meaning this test can be used) for the duration of the COVID-19 declaration under Section 564(b)(1) of the Act, 21 U.S.C. section 360bbb-3(b)(1), unless the authorization is terminated or revoked.  Performed at [facility], [place] [address]  [place], [place] [address]      Labs: BNP (last 3 results) Recent Labs    08/27/20 0818 05/11/21 0036 05/16/21 0826  BNP 424.0* 598.8* 585.2*   Basic Metabolic Panel: Recent Labs  Lab 05/12/21 0250 05/13/21 0340 05/14/21 0624 05/15/21 0315 05/16/21 0332  NA 139 139 138 137 137  K 4.0 3.8 3.8 4.0 4.3  CL 103 106 101 102 106  CO2 25 28 27 29 25   GLUCOSE 136* 114* 103* 107* 91  BUN 19 24* 29* 27* 18  CREATININE 1.90* 1.85* 2.06* 1.73* 1.61*  CALCIUM 8.9 8.9 9.1 9.1 8.9  PHOS 3.5 3.3 3.5 3.1 4.1   Liver Function Tests: Recent Labs  Lab 05/12/21 0250 05/13/21 0340 05/14/21 0624 05/15/21 0315 05/16/21 0332  ALBUMIN 3.1* 2.8* 3.0* 2.9* 2.7*   No results for input(s): LIPASE, AMYLASE in the last 168 hours. No results for input(s): AMMONIA in the last 168 hours. CBC: Recent Labs  Lab 05/12/21 0250 05/13/21 0340 05/14/21 0624 05/15/21 0315 05/16/21 0332  WBC 10.3 6.7 5.5 6.1 6.3  HGB 9.7* 9.6* 9.8* 9.4* 8.7*  HCT 30.2* 30.2* 30.7* 29.6* 27.1*  MCV 95.6 95.3 93.9 94.9 96.1  PLT 138* 126* 158 171 158   Cardiac Enzymes: No results for input(s): CKTOTAL, CKMB, CKMBINDEX, TROPONINI in the last 168 hours. BNP: Invalid input(s): POCBNP CBG: No results for input(s): GLUCAP in the last 168 hours. D-Dimer No results for input(s): DDIMER in the last 72 hours. Hgb A1c No results for input(s): HGBA1C in the last 72 hours. Lipid Profile No results for input(s): CHOL, HDL, LDLCALC, TRIG, CHOLHDL, LDLDIRECT in the last 72 hours. Thyroid function studies No results for input(s): TSH, T4TOTAL, T3FREE, THYROIDAB in the last 72 hours.  Invalid input(s): FREET3 Anemia work up No results for input(s): VITAMINB12, FOLATE, FERRITIN, TIBC, IRON, RETICCTPCT in the last 72 hours. Urinalysis    Component Value Date/Time   COLORURINE YELLOW 05/12/2021 [place] 05/12/2021 0937   LABSPEC 1.010 05/12/2021 0937   PHURINE 6.0 05/12/2021 [place]  05/12/2021 0937   HGBUR NEGATIVE 05/12/2021 [place] 05/12/2021 0937   KETONESUR NEGATIVE 05/12/2021 0937   PROTEINUR NEGATIVE 05/12/2021 0937   NITRITE NEGATIVE 05/12/2021 0937   LEUKOCYTESUR NEGATIVE 05/12/2021 0937   Sepsis Labs Invalid input(s): PROCALCITONIN,  WBC,  LACTICIDVEN  Microbiology Recent Results (from the past 240 hour(s))  Resp Panel by RT-PCR (Flu A&B, Covid) Nasopharyngeal Swab     Status: None   Collection Time: 05/11/21 12:36 AM   Specimen: Nasopharyngeal Swab; Nasopharyngeal(NP) swabs in vial transport medium  Result Value Ref Range Status   SARS Coronavirus 2 by RT PCR NEGATIVE NEGATIVE Final    Comment: (NOTE) SARS-CoV-2 target nucleic acids are NOT DETECTED.  The SARS-CoV-2 RNA is generally detectable in upper respiratory specimens during the acute phase of infection. The lowest concentration of SARS-CoV-2 viral copies this assay can detect is 138 copies/mL. A negative result does not preclude SARS-Cov-2 infection and should not be used as the sole basis for treatment or other patient management decisions. A negative result may occur with  improper specimen collection/handling, submission of specimen other than nasopharyngeal swab, presence of viral mutation(s) within the areas targeted by this assay, and inadequate number of viral copies(<138 copies/mL). A negative result must be combined with clinical observations, patient history, and epidemiological information. The expected result is Negative.  Fact Sheet for Patients:  EntrepreneurPulse.com.au  Fact Sheet for Healthcare Providers:  IncredibleEmployment.be  This test is no t yet approved or cleared by the [place] FDA and  has been authorized for detection and/or diagnosis of SARS-CoV-2 by FDA under an Emergency Use Authorization (EUA). This EUA will remain  in effect (meaning this test can be used) for the duration of the COVID-19  declaration under Section 564(b)(1) of the Act, 21 U.S.C.section 360bbb-3(b)(1), unless the authorization is terminated  or revoked sooner.       Influenza A by PCR NEGATIVE NEGATIVE Final   Influenza B by PCR NEGATIVE NEGATIVE Final    Comment: (NOTE) The Xpert Xpress SARS-CoV-2/FLU/RSV plus assay is intended as an aid in the diagnosis of influenza from Nasopharyngeal swab specimens and should not be used as a sole basis for treatment. Nasal washings and aspirates are unacceptable for Xpert Xpress SARS-CoV-2/FLU/RSV testing.  Fact Sheet for Patients: EntrepreneurPulse.com.au  Fact Sheet for Healthcare Providers: IncredibleEmployment.be  This test is not yet approved or cleared by the [place] FDA and has been authorized for detection and/or diagnosis of SARS-CoV-2 by FDA under an Emergency Use Authorization (EUA). This EUA will remain in effect (meaning this test can be used) for the duration of the COVID-19 declaration under Section 564(b)(1) of the Act, 21 U.S.C. section 360bbb-3(b)(1), unless the authorization is terminated or revoked.  Performed at [facility], [place] [address] [place],  [address]      Time coordinating discharge: Over 30 minutes  SIGNED:   Armari Fussell J [place] DO  [facility] 05/16/2021, 12:11 PM

## 2021-05-16 NOTE — Care Management Important Message (Signed)
Important Message  Patient Details  Name: Kristin [name] MRN: [id number] Date of Birth: 01-28-1941   Medicare Important Message Given:  Yes     Nnenna Meador Montine Circle 05/16/2021, 3:44 PM

## 2021-05-16 NOTE — Discharge Instructions (Addendum)
Femoral Site Care  This sheet gives you information about how to care for yourself after your procedure. Your health care provider may also give you more specific instructions. If you have problems or questions, contact your health care provider. What can I expect after the procedure? After the procedure, it is common to have:  Bruising that usually fades within 1-2 weeks.  Tenderness at the site. Follow these instructions at home: Wound care  Follow instructions from your health care provider about how to take care of your insertion site. Make sure you: ? Wash your hands with soap and water before you change your bandage (dressing). If soap and water are not available, use hand sanitizer. ? Change your dressing as told by your health care provider. ? Leave stitches (sutures), skin glue, or adhesive strips in place. These skin closures may need to stay in place for 2 weeks or longer. If adhesive strip edges start to loosen and curl up, you may trim the loose edges. Do not remove adhesive strips completely unless your health care provider tells you to do that.  Do not take baths, swim, or use a hot tub until your health care provider approves.  You may shower 24-48 hours after the procedure or as told by your health care provider. ? Gently wash the site with plain soap and water. ? Pat the area dry with a clean towel. ? Do not rub the site. This may cause bleeding.  Do not apply powder or lotion to the site. Keep the site clean and dry.  Check your femoral site every day for signs of infection. Check for: ? Redness, swelling, or pain. ? Fluid or blood. ? Warmth. ? Pus or a bad smell. Activity  For the first 2-3 days after your procedure, or as long as directed: ? Avoid climbing stairs as much as possible. ? Do not squat.  Do not lift anything that is heavier than 10 lb (4.5 kg), or the limit that you are told, until your health care provider says that it is safe.  Rest as  directed. ? Avoid sitting for a long time without moving. Get up to take short walks every 1-2 hours.  Do not drive for 24 hours if you were given a medicine to help you relax (sedative). General instructions  Take over-the-counter and prescription medicines only as told by your health care provider.  Keep all follow-up visits as told by your health care provider. This is important. Contact a health care provider if you have:  A fever or chills.  You have redness, swelling, or pain around your insertion site. Get help right away if:  The catheter insertion area swells very fast.  You pass out.  You suddenly start to sweat or your skin gets clammy.  The catheter insertion area is bleeding, and the bleeding does not stop when you hold steady pressure on the area.  The area near or just beyond the catheter insertion site becomes pale, cool, tingly, or numb. These symptoms may represent a serious problem that is an emergency. Do not wait to see if the symptoms will go away. Get medical help right away. Call your local emergency services (911 in the [place].S[place] Do not drive yourself to the hospital. Summary  After the procedure, it is common to have bruising that usually fades within 1-2 weeks.  Check your femoral site every day for signs of infection.  Do not lift anything that is heavier than 10 lb (4.5 kg), or   the limit that you are told, until your health care provider says that it is safe. This information is not intended to replace advice given to you by your health care provider. Make sure you discuss any questions you have with your health care provider. Document Revised: 07/28/2020 Document Reviewed: 07/28/2020 Elsevier Patient Education  2021 Elsevier Inc.  

## 2021-05-16 NOTE — Progress Notes (Signed)
CARDIAC REHAB PHASE I   PRE:  Rate/Rhythm: 66 SR    BP: sitting 121/105    SaO2:   MODE:  Ambulation: 400 ft   POST:  Rate/Rhythm: 115 ST    BP: sitting 150/85     SaO2: 98 RA  Pt ambulated with slow pace, min assist. Slight LOB at end of hall. Had her rest. HR up to 115 ST. Pt denied CP, just fatigued. Pt probably deconditioned since she has been limited by fatigue since September. Gave guidelines for her to increase walking in her house. Discussed Plavix, MI, stent, restrictions, diet, exercise, NTG and CRPII. Will refer to [place] but will need RCA stent first. Pt receptive.  [place], ACSM 05/16/2021 9:13 AM

## 2021-05-16 NOTE — Progress Notes (Signed)
Pt is alert and oriented. Discharge instructions/ AVS given to pt and the daughter.

## 2021-05-16 NOTE — Care Management Important Message (Signed)
Important Message  Patient Details  Name: Kristin [name] MRN: [id number] Date of Birth: 1941-04-22   Medicare Important Message Given:  Yes  Correction Patient had a contact precaution in place IM will be mailed   Orbie Pyo 05/16/2021, 3:45 PM

## 2021-05-16 NOTE — Progress Notes (Signed)
Patient ID: Kristin [name], female   DOB: [birth year], 80 y.o.   MRN: [id number] S: Feels well, no complaints. O:BP (!) 117/58 (BP Location: Right Arm)   Pulse 68   Temp 97.9 F (36.6 C) (Oral)   Resp 16   Ht 5' 4"  (1.626 m)   Wt 92.8 kg   SpO2 100%   BMI 35.12 kg/m   Intake/Output Summary (Last 24 hours) at 05/16/2021 1215 Last data filed at 05/16/2021 0100 Gross per 24 hour  Intake 898.7 ml  Output 950 ml  Net -51.3 ml   Intake/Output: I/O last 3 completed shifts: In: 1685 [P.O.:270; I.V.:1415] Out: 2750 [Urine:2750]  Intake/Output this shift:  No intake/output data recorded. Weight change: 0.045 kg Gen: NAD CVS: RRR Resp: CTA Abd: bening Ext: no edema  Recent Labs  Lab 05/11/21 1004 05/11/21 1818 05/12/21 0250 05/13/21 0340 05/14/21 0624 05/15/21 0315 05/16/21 0332  NA 141 138 139 139 138 137 137  K 4.9 4.3 4.0 3.8 3.8 4.0 4.3  CL 110 103 103 106 101 102 106  CO2 26 25 25 28 27 29 25   GLUCOSE 129* 114* 136* 114* 103* 107* 91  BUN 21 18 19  24* 29* 27* 18  CREATININE 2.04* 1.85* 1.90* 1.85* 2.06* 1.73* 1.61*  ALBUMIN  --   --  3.1* 2.8* 3.0* 2.9* 2.7*  CALCIUM 9.1 9.0 8.9 8.9 9.1 9.1 8.9  PHOS  --   --  3.5 3.3 3.5 3.1 4.1   Liver Function Tests: Recent Labs  Lab 05/14/21 0624 05/15/21 0315 05/16/21 0332  ALBUMIN 3.0* 2.9* 2.7*   No results for input(s): LIPASE, AMYLASE in the last 168 hours. No results for input(s): AMMONIA in the last 168 hours. CBC: Recent Labs  Lab 05/12/21 0250 05/13/21 0340 05/14/21 0624 05/15/21 0315 05/16/21 0332  WBC 10.3 6.7 5.5 6.1 6.3  HGB 9.7* 9.6* 9.8* 9.4* 8.7*  HCT 30.2* 30.2* 30.7* 29.6* 27.1*  MCV 95.6 95.3 93.9 94.9 96.1  PLT 138* 126* 158 171 158   Cardiac Enzymes: No results for input(s): CKTOTAL, CKMB, CKMBINDEX, TROPONINI in the last 168 hours. CBG: No results for input(s): GLUCAP in the last 168 hours.  Iron Studies: No results for input(s): IRON, TIBC, TRANSFERRIN, FERRITIN in the last 72  hours. Studies/Results: CARDIAC CATHETERIZATION  Result Date: 05/15/2021 Prox-mid LAD 60-99% stenosis Successful percutaneous coronary intervention mid-prox LAD PTCA and stent placement 3.5 X 32 mm Synergy drug-eluting stent Proximal post dilatation using 4.0X15 mm balloon at 22 atm 99-->0% residual stenosis TIMI flow I-->III Patient is also on eliquis for Afib Recommend Aspirin, plavix, and eliquis for 1 month After that, discontinue Aspirin and continue plavix and eliquis for at least 1 year After 1 year, could consider stopping plavix Recommend symptoms guided management for ostial RCA stenosis Continue GDMT for HFrEF Nigel Mormon, MD Pager: [phone] Office: [phone]   . apixaban  2.5 mg Oral BID  . aspirin EC  81 mg Oral Daily  . clopidogrel  75 mg Oral Q breakfast  . isosorbide-hydrALAZINE  1 tablet Oral TID  . metoprolol succinate  50 mg Oral Daily  . rosuvastatin  20 mg Oral QHS  . sodium chloride flush  3 mL Intravenous Q12H  . sodium chloride flush  3 mL Intravenous Q12H  . traZODone  50 mg Oral QHS    BMET    Component Value Date/Time   NA 137 05/16/2021 0332   NA 143 05/03/2021 0930   K 4.3 05/16/2021 0332  CL 106 05/16/2021 0332   CO2 25 05/16/2021 0332   GLUCOSE 91 05/16/2021 0332   BUN 18 05/16/2021 0332   BUN 72 (H) 05/03/2021 0930   CREATININE 1.61 (H) 05/16/2021 0332   CALCIUM 8.9 05/16/2021 0332   GFRNONAA 32 (L) 05/16/2021 0332   GFRAA 23 (L) 01/26/2021 1040   CBC    Component Value Date/Time   WBC 6.3 05/16/2021 0332   RBC 2.82 (L) 05/16/2021 0332   HGB 8.7 (L) 05/16/2021 0332   HGB 11.4 03/13/2020 0949   HCT 27.1 (L) 05/16/2021 0332   HCT 36.5 03/13/2020 0949   PLT 158 05/16/2021 0332   PLT 425 03/13/2020 0949   MCV 96.1 05/16/2021 0332   MCV 95 03/13/2020 0949   MCH 30.9 05/16/2021 0332   MCHC 32.1 05/16/2021 0332   RDW 13.4 05/16/2021 0332   RDW 12.6 03/13/2020 0949   LYMPHSABS 2.0 09/25/2020 1951   LYMPHSABS 2.4 02/28/2020 1725    MONOABS 1.0 09/25/2020 1951   EOSABS 0.1 09/25/2020 1951   EOSABS 0.2 02/28/2020 1725   BASOSABS 0.0 09/25/2020 1951   BASOSABS 0.1 02/28/2020 1725     Assessment/Plan:  1. NSTEMI: Improved with IV NTG and plan for PCI as she declined CABG. 1. S/p successful PCI of mid-prox LAD and stent placement with DES (99%->0%) 2. Aspirin, plavix, and eliquis for 1 month per Cardiology, then discontinue ASA and cont plavix and eliquis for 1 year. 2. CKD stage IV: Presumably due to hypertensive nephrosclerosis. Scr has been stable and at her baseline (1.7-2 and followed by Dr. Joylene Grapes in our office) 1. Scr improved to 1.73 but s/p PCI and stent placement so will need to follow Scr. 2. Nothing further to add and will sign off.  Call with questions or concerns 3. Follow up with Dr. Joylene Grapes as already scheduled.  3. HTN: Stable 4. Anemia of CKD stage IV: Started on IV iron and follow. May need ESA.   Donetta Potts, MD [employer] [phone]

## 2021-05-16 NOTE — Progress Notes (Signed)
Subjective:  Patient seen and examined at 8am.  Doing well s/p PCI on 6/7 No CP or dyspnea  LH and dizziness has improved as well.  Morning Cr 1.61 compared to 1.73 (yday).    Objective:  Vital Signs in the last 24 hours: Temp:  [97.8 F (36.6 C)-98.5 F (36.9 C)] 97.9 F (36.6 C) (06/08 0432) Pulse Rate:  [68-74] 68 (06/08 0432) Resp:  [16-20] 16 (06/08 0432) BP: (97-149)/(55-90) 117/58 (06/08 0432) SpO2:  [95 %-100 %] 100 % (06/08 0432) Arterial Line BP: (152-173)/(76-88) 152/77 (06/07 1300) Weight:  [92.8 kg] 92.8 kg (06/08 0508)  Intake/Output from previous day: 06/07 0701 - 06/08 0700 In: 898.7 [P.O.:270; I.V.:628.7] Out: 950 [Urine:950]  Physical Exam Vitals and nursing note reviewed.  Constitutional:      General: She is not in acute distress.    Appearance: She is well-developed.  HENT:     Head: Normocephalic and atraumatic.  Eyes:     Conjunctiva/sclera: Conjunctivae normal.     Pupils: Pupils are equal, round, and reactive to light.  Neck:     Vascular: No JVD.  Cardiovascular:     Rate and Rhythm: Normal rate and regular rhythm.     Pulses: Normal pulses and intact distal pulses.     Heart sounds: No murmur heard.   Pulmonary:     Effort: Pulmonary effort is normal.     Breath sounds: No wheezing or rales.  Abdominal:     General: Bowel sounds are normal.     Palpations: Abdomen is soft.     Tenderness: There is no rebound.  Musculoskeletal:        General: No tenderness. Normal range of motion.     Right lower leg: No edema.     Left lower leg: No edema.  Lymphadenopathy:     Cervical: No cervical adenopathy.  Skin:    General: Skin is warm and dry.  Neurological:     Mental Status: She is alert and oriented to person, place, and time.     Cranial Nerves: No cranial nerve deficit.    Lab Results: BMP Recent Labs    10/27/20 1059 01/05/21 0917 01/26/21 1040 04/13/21 1010 05/14/21 0624 05/15/21 0315 05/16/21 0332  NA 142 145* 144    < > 138 137 137  K 4.2 4.1 4.3   < > 3.8 4.0 4.3  CL 101 99 98   < > 101 102 106  CO2 26 29 28    < > 27 29 25   GLUCOSE 105* 86 80   < > 103* 107* 91  BUN 27 48* 41*   < > 29* 27* 18  CREATININE 1.89* 2.40* 2.22*   < > 2.06* 1.73* 1.61*  CALCIUM 9.8 9.8 9.9   < > 9.1 9.1 8.9  GFRNONAA 25* 19* 20*   < > 24* 30* 32*  GFRAA 28* 21* 23*  --   --   --   --    < > = values in this interval not displayed.    CBC Recent Labs  Lab 05/16/21 0332  WBC 6.3  RBC 2.82*  HGB 8.7*  HCT 27.1*  PLT 158  MCV 96.1  MCH 30.9  MCHC 32.1  RDW 13.4    HEMOGLOBIN A1C Lab Results  Component Value Date   HGBA1C 6.1 (H) 05/11/2021   MPG 128 05/11/2021    Cardiac Panel (last 3 results) Results for KIMBERLIN, SCHEEL (MRN [id number]) as of 05/12/2021 12:10  Ref. Range  05/10/2021 19:19 05/10/2021 21:19 05/11/2021 04:38 05/11/2021 10:04  Troponin I (High Sensitivity) Latest Ref Range: <18 ng/L 75 (H) 88 (H) 131 (HH) 171 (HH)   BNP (last 3 results) Recent Labs    08/27/20 0818 05/11/21 0036 05/16/21 0826  BNP 424.0* 598.8* 305.1*    TSH Recent Labs    08/22/20 1759  TSH 1.054    Lipid Panel     Component Value Date/Time   CHOL 130 08/29/2020 0250   TRIG 89 08/29/2020 0250   HDL 47 08/29/2020 0250   CHOLHDL 2.8 08/29/2020 0250   VLDL 18 08/29/2020 0250   LDLCALC 65 08/29/2020 0250     Hepatic Function Panel Recent Labs    08/22/20 1759 08/26/20 2304 09/25/20 1951 05/12/21 0250 05/14/21 0624 05/15/21 0315 05/16/21 0332  PROT 7.2 6.5 6.6  --   --   --   --   ALBUMIN 2.8* 2.5* 3.5   < > 3.0* 2.9* 2.7*  AST 190* 50* 20  --   --   --   --   ALT 66* 45* 12  --   --   --   --   ALKPHOS 79 53 55  --   --   --   --   BILITOT 0.5 0.9 0.4  --   --   --   --   BILIDIR  --  0.2  --   --   --   --   --   IBILI  --  0.7  --   --   --   --   --    < > = values in this interval not displayed.    Imaging: Chest x-ray 05/10/2021: 1. Mild cardiomegaly with interstitial edema and Kerley B  lines. 2. Chronic elevation of the right hemidiaphragm. 3.  Aortic Atherosclerosis (ICD10-I70.0).  Cardiac Studies:  EKG 05/12/2021: Sinus rhythm 100 bpm Left atrial enlargement Left ventricular hypertrophy Old anteroseptal infarct  Coronary angiography 05/11/2021: LM: Normal LAD: Prox 60% stenosis        Mid 95% stenosis just prox to large bifurcating diag        Diag ostium does not seem to be involved.        (Prior cath in 08/2020 noted prox 80% stenosis, followed by long mid to apical LAD occlusion, which seems to have recanalized) LCx: No significant disease RCA: Ostial 90% stenosis         (Significant dampening of pressure noted)         RPL 60% stenosis RCA and LCx system seems largely unchanged compared to 08/2020  Two vessel CAD (Prox-mid LAD and ostial RCA) Elevated LVEDP 29 mmHg  Given acute HFrEF, recommend staged revascularization. Options include CABG (Grafts to LAD, Diag, and RCA) vs PCI to LAD and RCA. Recommend medical management of heart failure over the weekend, followed by revascularization.  Staged Intervention to LAD 05/15/2021: Prox-mid LAD 60-99% stenosis  Successful percutaneous coronary intervention mid-prox LAD PTCA and stent placement 3.5 X 32 mm Synergy drug-eluting stent Proximal post dilatation using 4.0X15 mm balloon at 22 atm  99-->0% residual stenosis TIMI flow I-->III  Patient is also on eliquis for Afib Recommend Aspirin, plavix, and eliquis for 1 month After that, discontinue Aspirin and continue plavix and eliquis for at least 1 year After 1 year, could consider stopping plavix  Echocardiogram 05/11/2021: 1. Left ventricular ejection fraction, by estimation, is 45 to 50%. The  left ventricle has normal function. The left ventricle demonstrates  regional wall motion abnormalities (see scoring diagram/findings for  description). Left ventricular diastolic  function could not be evaluated. There is akinesis of the left  ventricular,  mid-apical anterior wall and anteroseptal wall. There is  akinesis of the left ventricular, apical segment.  2. Right ventricular systolic function is normal. The right ventricular  size is normal.  3. Left atrial size was severely dilated.  4. The mitral valve is grossly normal. Mild mitral valve regurgitation.  5. The aortic valve is tricuspid. Aortic valve regurgitation is not  visualized.  6. The inferior vena cava is normal in size with <50% respiratory  variability, suggesting right atrial pressure of 8 mmHg.  7. Compared to previous study on 12/15/2020, LVEF is marginally lower from  50-55%. Wall motion abnormalities are more pronounced.   Assessment & Recommendations:  80 y.o. African-American female  with coronary artery disease (proximal LAD occlusion, ostial RCA critical stenosis), history of post op MI after spinal surgery (08/2020), along with stroke-medically treated, CKD stage IV, type 2 diabetes mellitus, paroxysmal atrial fibrillation, admitted with NSTEMI, acute on chronic HFrEF  NSTEMI: Currently chest pain-free. Underwent left heart catheterization on 05/11/2021 and was staged for LAD intervention yesterday 05/15/2021. She is tolerated the procedure well without any acute/subacute complications. Given the fluoroscopy exposure I have asked her to keep a close eye on any rashes or skin lesions.  None present on physical examination. Recommended anticoagulation, aspirin and Plavix for 1 month.   Thereafter we will continue Eliquis and Plavix. Morning creatinine is relatively stable.  However she is still at risk of contrast-induced nephropathy for the next 24 to 48 hours and therefore would hold off ARB's for now we will restart them at the next office visit. Medications reconciled.  Acute on chronic HFrEF: Most likely secondary to ischemic cardiomyopathy. Currently euvolemic. BNP trending downward. Continue BiDil 20/3.5 mg p.o. twice daily.  Continue Toprol-XL 50 mg  p.o. daily. Hold off on Bumex for now we will readdress at the next office visit. We will slowly uptitrate GDMT given her chronic kidney disease stage.  CKD stage IV: Creatinine improved  Hold losartan and Bumex for now.  We will consider restarting it at the next office visit depending on blood work. Appreciate input from nephrologist Dr. Posey Pronto.  History of stroke: Cerebellar stroke in 08/2020. Patient reportedly had an episode of left-sided facial numbness a week ago, but denies any other recent acute stroke signs or symptoms. Brain MRI shows old right parietal and left cerebellar infarcts, no acute abnormality.   She is stable to be discharged from a cardiovascular standpoint.  I will have a transitional care visit scheduled for next week 05/23/2021 depart updated.  She will need blood work prior to the office visit so her medications can further be titrated if laboratory and hemodynamics allow.  Recommendations discussed with both RN as well as primary team.  Total time spent: 35 minutes.  Rex Kras, [place], [facility]  Pager: [phone] Office: [phone]

## 2021-05-16 NOTE — Progress Notes (Signed)
Mobility Specialist - Progress Note   05/16/21 1100  Mobility  Activity Ambulated in hall  Level of Assistance Contact guard assist, steadying assist  Assistive Device None  Distance Ambulated (ft) 200 ft (100 ft x 2)  Mobility Ambulated with assistance in hallway  Mobility Response Tolerated fair  Mobility performed by Mobility specialist  $Mobility charge 1 Mobility   Distance limited by fatigue. She required one standing rest break due to unsteadiness. SpO2 was 100% on RA, HR up to 104 w/ mobility. Pt back in bed after walk, call bell at side.   Pricilla Handler Mobility Specialist Mobility Specialist Phone: [phone]

## 2021-05-17 ENCOUNTER — Other Ambulatory Visit: Payer: Self-pay

## 2021-05-17 DIAGNOSIS — I5032 Chronic diastolic (congestive) heart failure: Secondary | ICD-10-CM

## 2021-05-17 DIAGNOSIS — I255 Ischemic cardiomyopathy: Secondary | ICD-10-CM

## 2021-05-17 DIAGNOSIS — I252 Old myocardial infarction: Secondary | ICD-10-CM

## 2021-05-21 ENCOUNTER — Telehealth (HOSPITAL_COMMUNITY): Payer: Self-pay

## 2021-05-21 NOTE — Telephone Encounter (Signed)
Called pt to see if she was interested in the cardiac rehab program, pt's daughter stated that pt was not interested. Closed referral

## 2021-05-23 ENCOUNTER — Ambulatory Visit: Payer: Medicare HMO | Admitting: Cardiology

## 2021-05-23 DIAGNOSIS — I255 Ischemic cardiomyopathy: Secondary | ICD-10-CM | POA: Diagnosis not present

## 2021-05-23 DIAGNOSIS — I252 Old myocardial infarction: Secondary | ICD-10-CM | POA: Diagnosis not present

## 2021-05-23 DIAGNOSIS — I5032 Chronic diastolic (congestive) heart failure: Secondary | ICD-10-CM | POA: Diagnosis not present

## 2021-05-24 LAB — BASIC METABOLIC PANEL
BUN/Creatinine Ratio: 12 (ref 12–28)
BUN: 19 mg/dL (ref 8–27)
CO2: 22 mmol/L (ref 20–29)
Calcium: 9.2 mg/dL (ref 8.7–10.3)
Chloride: 106 mmol/L (ref 96–106)
Creatinine, Ser: 1.63 mg/dL — ABNORMAL HIGH (ref 0.57–1.00)
Glucose: 75 mg/dL (ref 65–99)
Potassium: 4.6 mmol/L (ref 3.5–5.2)
Sodium: 145 mmol/L — ABNORMAL HIGH (ref 134–144)
eGFR: 32 mL/min/{1.73_m2} — ABNORMAL LOW (ref >59)

## 2021-05-24 LAB — MAGNESIUM: Magnesium: 2.2 mg/dL (ref 1.6–2.3)

## 2021-05-24 LAB — PRO B NATRIURETIC PEPTIDE: NT-Pro BNP: 1973 pg/mL — ABNORMAL HIGH (ref 0–738)

## 2021-05-25 ENCOUNTER — Ambulatory Visit: Payer: Medicare HMO | Admitting: Cardiology

## 2021-05-25 ENCOUNTER — Encounter: Payer: Self-pay | Admitting: Cardiology

## 2021-05-25 ENCOUNTER — Other Ambulatory Visit: Payer: Self-pay

## 2021-05-25 VITALS — BP 146/80 | HR 72 | Temp 98.3°F | Resp 16 | Ht 64.0 in | Wt 204.0 lb

## 2021-05-25 DIAGNOSIS — Z7901 Long term (current) use of anticoagulants: Secondary | ICD-10-CM

## 2021-05-25 DIAGNOSIS — I252 Old myocardial infarction: Secondary | ICD-10-CM | POA: Diagnosis not present

## 2021-05-25 DIAGNOSIS — E1122 Type 2 diabetes mellitus with diabetic chronic kidney disease: Secondary | ICD-10-CM

## 2021-05-25 DIAGNOSIS — Z955 Presence of coronary angioplasty implant and graft: Secondary | ICD-10-CM | POA: Diagnosis not present

## 2021-05-25 DIAGNOSIS — N1832 Chronic kidney disease, stage 3b: Secondary | ICD-10-CM | POA: Diagnosis not present

## 2021-05-25 DIAGNOSIS — I480 Paroxysmal atrial fibrillation: Secondary | ICD-10-CM

## 2021-05-25 DIAGNOSIS — Z8673 Personal history of transient ischemic attack (TIA), and cerebral infarction without residual deficits: Secondary | ICD-10-CM | POA: Diagnosis not present

## 2021-05-25 DIAGNOSIS — I5032 Chronic diastolic (congestive) heart failure: Secondary | ICD-10-CM

## 2021-05-25 DIAGNOSIS — I25118 Atherosclerotic heart disease of native coronary artery with other forms of angina pectoris: Secondary | ICD-10-CM | POA: Diagnosis not present

## 2021-05-25 DIAGNOSIS — E782 Mixed hyperlipidemia: Secondary | ICD-10-CM

## 2021-05-25 MED ORDER — METOPROLOL SUCCINATE ER 100 MG PO TB24: 100.0000 mg | ORAL_TABLET | Freq: Every day | ORAL | 0 refills | Status: DC

## 2021-05-25 MED ORDER — NITROGLYCERIN 0.4 MG SL SUBL: 0.4000 mg | SUBLINGUAL_TABLET | SUBLINGUAL | 0 refills | Status: DC | PRN

## 2021-05-25 NOTE — Progress Notes (Signed)
[name] Date of Birth: 29-Jun-1941 MRN: [id number] Primary Care Provider:Hagler, Apolonio Schneiders, MD Primary Cardiologist: Rex Kras, DO, [facility] (established care 08/22/2020)  Date: 05/25/21 Last Office Visit: 04/06/2021   Chief Complaint  Patient presents with   Hospitalization Follow-up   Results   Chest Pain    HPI  Kristin [name] is a 80 y.o.  female who presents to the office with a chief complaint of " hospital follow up and chest pain." Patient's past medical history and cardiovascular risk factors include: Paroxysmal atrial fibrillation, NSTEMI (08/2020), multivessel CAD, recovered ischemic cardiomyopathy, chronic HFpEF/stage C/NYHA class II, acute cerebellar stroke, lumbar stenosis status post laminectomy, hypertension, CKD stage IV, diabetes miellitus type 2, postmenopausal female, advanced age.   Patient presents to the office accompanied by her daughter Kristin [name].   Patient establish care back in September 2021 while she was hospitalized for bilateral lower extremity weakness and back pain.  She had undergone L2-L4 laminectomy and subsequently was diagnosed with non-STEMI with a high sensitive troponin greater than 27,000.  She also had new onset of atrial fibrillation.  Due to recent neurosurgery she was not a candidate for full oral anticoagulation.  During the same hospitalization she also had another stroke involving the cerebellar distribution.  Since then she has been managed medically with guideline directed medical therapy.  Repeat echocardiogram noted preserved LVEF and grade 1 diastolic impairment.  Until recently in June 2022 she went to the hospital for anginal chest pain.  Troponins were elevated and patient did undergo diagnostic testing and was found to have LAD and RCA disease.  Given her chronic kidney disease coronary intervention was stage to discuss modalities with the patient and family members.  Discussed both coronary artery bypass grafting surgery versus  PCI.  Thereafter patient and family decided to proceed with PCI.  Patient underwent PCI to the proximal/mid LAD and then later discharged home in a stable condition.  Now presents to the office for follow-up.  Patient states that she continues to have chest pain, located over the right anterior chest wall, brought on by effort related activities, resolving with rest.  The pain is very similar to when she had gone to the ER.  EKG shows normal sinus rhythm without underlying injury pattern.  Patient is compliant with dual antiplatelet therapy along with Eliquis 2.5 mg p.o. twice daily.  Patient is to continue triple therapy until June 14, 2021 and thereafter continue Plavix and Eliquis.  Medications reconciled.  Patient is on metoprolol 50 mg p.o. daily and is requiring one sublingual nitroglycerin tablet on a daily basis.  Patient also has a dyspnea on exertion which is chronic and stable.  FUNCTIONAL STATUS: No structured exercise program or daily routine.   ALLERGIES: No Known Allergies  MEDICATION LIST PRIOR TO VISIT: Current Outpatient Medications on File Prior to Visit  Medication Sig Dispense Refill   aspirin EC 81 MG tablet Take 1 tablet (81 mg total) by mouth daily. Swallow whole. 30 tablet 0   cholecalciferol (VITAMIN D) 25 MCG (1000 UNIT) tablet Take 3,000 Units by mouth daily.     clopidogrel (PLAVIX) 75 MG tablet Take 1 tablet (75 mg total) by mouth daily with breakfast. 90 tablet 0   colchicine 0.6 MG tablet Take 0.5 tablets (0.3 mg total) by mouth daily. 15 tablet 0   diclofenac Sodium (VOLTAREN) 1 % GEL Apply 2 g topically 4 (four) times daily.     ELIQUIS 2.5 MG TABS tablet Take 1 tablet by mouth twice daily (  Patient taking differently: Take 2.5 mg by mouth 2 (two) times daily.) 60 tablet 6   gabapentin (NEURONTIN) 100 MG capsule Take 200 mg by mouth at bedtime.     hydrALAZINE (APRESOLINE) 50 MG tablet Take 1.5 tablets (75 mg total) by mouth 3 (three) times daily. 45 tablet 3    isosorbide dinitrate (ISORDIL) 30 MG tablet Take 1.5 tablets (45 mg total) by mouth 3 (three) times daily. 45 tablet 3   methocarbamol (ROBAXIN) 500 MG tablet Take 1 tablet (500 mg total) by mouth every 6 (six) hours as needed for muscle spasms. 30 tablet 0   pantoprazole (PROTONIX) 40 MG tablet Take 1 tablet (40 mg total) by mouth daily. 30 tablet 0   rosuvastatin (CRESTOR) 20 MG tablet Take 1 tablet (20 mg total) by mouth at bedtime. 90 tablet 1   No current facility-administered medications on file prior to visit.    PAST MEDICAL HISTORY: Past Medical History:  Diagnosis Date   Atrial fibrillation (HCC)    CHF (congestive heart failure) ([place])    Coronary artery disease    Diabetes mellitus without complication ([place])    Hypercholesteremia    Hypertension    Renal disorder    CKD Stage IV   Stroke ([place])     PAST SURGICAL HISTORY: Past Surgical History:  Procedure Laterality Date   CORONARY STENT INTERVENTION N/A 05/15/2021   Procedure: CORONARY STENT INTERVENTION;  Surgeon: Nigel Mormon, MD;  Location: [facility];  Service: Cardiovascular;  Laterality: N/A;   LEFT HEART CATH AND CORONARY ANGIOGRAPHY N/A 05/11/2021   Procedure: LEFT HEART CATH AND CORONARY ANGIOGRAPHY;  Surgeon: Nigel Mormon, MD;  Location: [facility] [place] [facility];  Service: Cardiovascular;  Laterality: N/A;   LUMBAR LAMINECTOMY/DECOMPRESSION MICRODISCECTOMY N/A 08/19/2020   Procedure: LUMBAR LAMINECTOMY/DECOMPRESSION  Lumbar two-three, Lumbar three-four;  Surgeon: Dawley, Theodoro Doing, DO;  Location: [facility];  Service: Neurosurgery;  Laterality: N/A;   RIGHT/LEFT HEART CATH AND CORONARY ANGIOGRAPHY N/A 08/23/2020   Procedure: RIGHT/LEFT HEART CATH AND CORONARY ANGIOGRAPHY;  Surgeon: [name], MD;  Location: [facility];  Service: Cardiovascular;  Laterality: N/A;    FAMILY HISTORY: The patient's family history includes Diabetes in her father; Heart attack in her father; Heart disease in her mother;  Hypertension in her sister, sister, sister, sister, sister, sister, sister, sister, sister, sister, sister, and sister.   SOCIAL HISTORY:  The patient  reports that she has never smoked. She has never used smokeless tobacco. She reports that she does not drink alcohol and does not use drugs.  Review of Systems  Constitutional: Negative for chills and fever.  HENT:  Negative for hoarse voice and nosebleeds.        Difficultly hearing   Eyes:  Positive for blurred vision. Negative for discharge, double vision and pain.  Cardiovascular:  Positive for chest pain. Negative for claudication, dyspnea on exertion, leg swelling, near-syncope, orthopnea, palpitations, paroxysmal nocturnal dyspnea and syncope.  Respiratory:  Positive for shortness of breath. Negative for hemoptysis.   Musculoskeletal:  Positive for back pain and joint pain. Negative for muscle cramps and myalgias.       Unsteady with gait, but no falls.   Gastrointestinal:  Negative for abdominal pain, constipation, diarrhea, hematemesis, hematochezia, melena, nausea and vomiting.  Neurological:  Positive for paresthesias (hands and legs). Negative for dizziness and light-headedness.   PHYSICAL EXAM: Vitals with BMI 05/25/2021 05/16/2021 05/16/2021  Height 5' 4"  - -  Weight 204 lbs 204 lbs  10 oz -  BMI 35 10.2 -  Systolic 585 - 277  Diastolic 80 - 58  Pulse 72 - 68   CONSTITUTIONAL: Age-appropriate female, hemodynamically stable, no acute distress.  SKIN: Skin is warm and dry. No rash noted. No cyanosis. No pallor. No jaundice HEAD: Normocephalic and atraumatic.  EYES: No scleral icterus MOUTH/THROAT: Moist oral membranes.  NECK: No JVD present. No thyromegaly noted. No carotid bruits  LYMPHATIC: No visible cervical adenopathy.  CHEST Normal respiratory effort. No intercostal retractions  LUNGS: Clear to auscultation bilaterally.  No stridor. No wheezes. No rales.  CARDIOVASCULAR: Regular rate and rhythm, positive S1-S2, soft  holosystolic murmur heard at the apex, no rubs or gallops appreciated ABDOMINAL: Obese, soft, nontender, nondistended, positive bowel sounds in all 4 quadrants, no apparent ascites.  EXTREMITIES: No peripheral edema  HEMATOLOGIC: No significant bruising NEUROLOGIC: Oriented to person, place, and time. Nonfocal. Normal muscle tone.  PSYCHIATRIC: Normal mood and affect. Normal behavior. Cooperative  CARDIAC DATABASE: EKG: 05/25/2021: Normal sinus rhythm, 69 bpm, left atrial enlargement, old anteroseptal infarct, without underlying ischemia or injury pattern.   Echocardiogram: 12/15/2020 1. The distal inferoseptum and apex appear mildly hypokinetic, but WMA is much improved compared with prior study. LVEF has improved considerably. Left ventricular ejection fraction, by estimation, is 55 to 60%. Left ventricular ejection fraction by 3D volume is 56%. The left ventricle has normal function. The left ventricle demonstrates regional wall motion abnormalities (see scoring diagram/findings for description). Left ventricular diastolic parameters are consistent with Grade I diastolic dysfunction (impaired relaxation). 2. Right ventricular systolic function is normal. The right ventricular size is normal. There is normal pulmonary artery systolic pressure. The estimated right ventricular systolic pressure is 82.4 mmHg. 3. The mitral valve is grossly normal. Trivial mitral valve regurgitation. No evidence of mitral stenosis. 4. The aortic valve is tricuspid. There is mild calcification of the aortic valve. There is mild thickening of the aortic valve. Aortic valve regurgitation is not visualized. Mild aortic valve sclerosis is present, with no evidence of aortic valve stenosis. 5. The inferior vena cava is normal in size with greater than 50% respiratory variability, suggesting right atrial pressure of 3 mmHg.  Stress Testing:  No results found for this or any previous visit from the past 1095 days.  Heart  Catheterization: 05/11/2021 LM: Normal LAD: Prox 60% stenosis        Mid 95% stenosis just prox to large bifurcating diag        Diag ostium does not seem to be involved.        (Prior cath in 08/2020 noted prox 80% stenosis, followed by long mid to apical LAD occlusion, which seems to have recanalized) LCx: No significant disease RCA: Ostial 90% stenosis         (Significant dampening of pressure noted)         RPL 60% stenosis RCA and LCx system seems largely unchanged compared to 08/2020   Two vessel CAD (Prox-mid LAD and ostial RCA) Elevated LVEDP 29 mmHg   Given acute HFrEF, recommend staged revascularization. Options include CABG (Grafts to LAD, Diag, and RCA) vs PCI to LAD and RCA. Recommend medical management of heart failure over the weekend, followed by revascularization.  05/15/2021: Prox-mid LAD 60-99% stenosis   Successful percutaneous coronary intervention mid-prox LAD PTCA and stent placement 3.5 X 32 mm Synergy drug-eluting stent Proximal post dilatation using 4.0X15 mm balloon at 22 atm   99-->0% residual stenosis TIMI flow I-->III   Patient is also  on eliquis for Afib Recommend Aspirin, plavix, and eliquis for 1 month After that, discontinue Aspirin and continue plavix and eliquis for at least 1 year After 1 year, could consider stopping plavix   Recommend symptoms guided management for ostial RCA stenosis Continue GDMT for HFrEF  LABORATORY DATA: CBC Latest Ref Rng & Units 05/16/2021 05/15/2021 05/14/2021  WBC 4.0 - 10.5 K/uL 6.3 6.1 5.5  Hemoglobin 12.0 - 15.0 g/dL 8.7(L) 9.4(L) 9.8(L)  Hematocrit 36.0 - 46.0 % 27.1(L) 29.6(L) 30.7(L)  Platelets 150 - 400 K/uL 158 171 158    CMP Latest Ref Rng & Units 05/23/2021 05/16/2021 05/15/2021  Glucose 65 - 99 mg/dL 75 91 107(H)  BUN 8 - 27 mg/dL 19 18 27(H)  Creatinine 0.57 - 1.00 mg/dL 1.63(H) 1.61(H) 1.73(H)  Sodium 134 - 144 mmol/L 145(H) 137 137  Potassium 3.5 - 5.2 mmol/L 4.6 4.3 4.0  Chloride 96 - 106 mmol/L  106 106 102  CO2 20 - 29 mmol/L 22 25 29   Calcium 8.7 - 10.3 mg/dL 9.2 8.9 9.1  Total Protein 6.5 - 8.1 g/dL - - -  Total Bilirubin 0.3 - 1.2 mg/dL - - -  Alkaline Phos 38 - 126 U/L - - -  AST 15 - 41 U/L - - -  ALT 0 - 44 U/L - - -    Lipid Panel     Component Value Date/Time   CHOL 130 08/29/2020 0250   TRIG 89 08/29/2020 0250   HDL 47 08/29/2020 0250   CHOLHDL 2.8 08/29/2020 0250   VLDL 18 08/29/2020 0250   LDLCALC 65 08/29/2020 0250    Lab Results  Component Value Date   HGBA1C 6.1 (H) 05/11/2021   HGBA1C 6.7 (H) 08/27/2020   No components found for: NTPROBNP Lab Results  Component Value Date   TSH 1.054 08/22/2020   TSH 1.190 03/13/2020    Cardiac Panel (last 3 results) No results for input(s): CKTOTAL, CKMB, TROPONINIHS, RELINDX in the last 72 hours.  External Labs: Collected: 03/16/2021 performed by her PCP Creatinine 2.02 mg/dL. eGFR: 24 mL/min per 1.73 m Hemoglobin 12 g/dL, hematocrit 36.9% Sodium 145, potassium 3.6, chloride 101, bicarb 36, BUN 38 TSH 1.62  IMPRESSION:    ICD-10-CM   1. Atherosclerosis of native coronary artery of native heart with stable angina pectoris (HCC)  I25.118 nitroGLYCERIN (NITROSTAT) 0.4 MG SL tablet    metoprolol succinate (TOPROL-XL) 100 MG 24 hr tablet    Basic metabolic panel    Magnesium    Pro b natriuretic peptide (BNP)    2. History of coronary angioplasty with insertion of stent  Z95.5     3. Hx of non-ST elevation myocardial infarction (NSTEMI)  I25.2     4. Chronic heart failure with preserved ejection fraction (HFpEF) (HCC)  I50.32 EKG 02-OVZC    Basic metabolic panel    Magnesium    Pro b natriuretic peptide (BNP)    5. Paroxysmal atrial fibrillation (HCC)  I48.0     6. Long term (current) use of anticoagulants  Z79.01     7. Mixed hyperlipidemia  E78.2     8. Hx of stroke  Z86.73     9. Type 2 diabetes mellitus with stage 3b chronic kidney disease, without long-term current use of insulin [facility])   E11.22    N18.32        RECOMMENDATIONS: Geraline Halberstadt is a 80 y.o. female whose past medical history and cardiovascular risk factors include: Paroxysmal atrial fibrillation, NSTEMI (08/2020), multivessel  CAD, ischemic cardiomyopathy, chronic heart failure with reduced EF/stage C/NYHA class II, acute cerebellar stroke, lumbar stenosis status post laminectomy, hypertension, CKD stage IV, diabetes miellitus type 2, postmenopausal female, advanced age.   Atherosclerosis of the native coronary arteries with angina pectoris status post PCI: Patient complains of symptoms similar to her recent hospitalization. She underwent PCI to the proximal/mid LAD on May 15, 2021. Currently on triple therapy until June 14, 2021 thereafter we will continue Eliquis and Plavix for 1 year. Increase metoprolol to 100 mg p.o. daily. Refill sublingual nitroglycerin tablets. EKG in the office illustrates normal sinus rhythm without underlying injury pattern. Discussed intervention to the RCA versus continued medical management.  Given her chronic kidney disease both the patient and daughter would like to continue up titration for now.  However, they understand that his symptoms worsen in intensity, frequency, duration she will seek medical attention by going to the closest ER via EMS. Most recent labs 05/23/2021 independently reviewed.  Renal function relatively stable.  Paroxysmal atrial fibrillation:  Currently normal sinus rhythm.  Continue Toprol-XL and Eliquis.   She remains in normal sinus rhythm despite discontinuation of amiodarone.  Long-term oral anticoagulation: CHA2DS2-VASc SCORE is 9 which correlates to 15.2 % risk of stroke per year (CHF, hypertension, age greater than 44, diabetes, stroke, history of non-STEMI, female). Reemphasized the risks, benefits, and alternatives to oral anticoagulation.   Chronic heart failure with preserved EF, stage C, NYHA class II: Continue current medical  therapy. Medications reconciled. Currently on hydralazine/Isordil. Hold off on reinitiation of losartan for now given renal function and need for possible coronary angiogram. Recommend daily weight check, strict I/O's Fluid restriction to <2L per day, Na restriction < 2g per day  Type 2 diabetes mellitus, non-insulin-dependent: Currently managed by primary care provider.  Mixed Hyperlipidemia:  Continue statin therapy.   Follow lipids. Currently managed by primary care provider. Patient denies myalgia or other side effects. Most recent lipid profile reviewed with the patient. Most recent AST and ALT values reviewed with the patient.  FINAL MEDICATION LIST END OF ENCOUNTER: Meds ordered this encounter  Medications   nitroGLYCERIN (NITROSTAT) 0.4 MG SL tablet    Sig: Place 1 tablet (0.4 mg total) under the tongue every 5 (five) minutes as needed for chest pain.    Dispense:  30 tablet    Refill:  0   metoprolol succinate (TOPROL-XL) 100 MG 24 hr tablet    Sig: Take 1 tablet (100 mg total) by mouth daily. Take with or immediately following a meal.    Dispense:  30 tablet    Refill:  0     Current Outpatient Medications:    aspirin EC 81 MG tablet, Take 1 tablet (81 mg total) by mouth daily. Swallow whole., Disp: 30 tablet, Rfl: 0   cholecalciferol (VITAMIN D) 25 MCG (1000 UNIT) tablet, Take 3,000 Units by mouth daily., Disp: , Rfl:    clopidogrel (PLAVIX) 75 MG tablet, Take 1 tablet (75 mg total) by mouth daily with breakfast., Disp: 90 tablet, Rfl: 0   colchicine 0.6 MG tablet, Take 0.5 tablets (0.3 mg total) by mouth daily., Disp: 15 tablet, Rfl: 0   diclofenac Sodium (VOLTAREN) 1 % GEL, Apply 2 g topically 4 (four) times daily., Disp: , Rfl:    ELIQUIS 2.5 MG TABS tablet, Take 1 tablet by mouth twice daily (Patient taking differently: Take 2.5 mg by mouth 2 (two) times daily.), Disp: 60 tablet, Rfl: 6   gabapentin (NEURONTIN) 100 MG capsule, Take  200 mg by mouth at bedtime., Disp:  , Rfl:    hydrALAZINE (APRESOLINE) 50 MG tablet, Take 1.5 tablets (75 mg total) by mouth 3 (three) times daily., Disp: 45 tablet, Rfl: 3   isosorbide dinitrate (ISORDIL) 30 MG tablet, Take 1.5 tablets (45 mg total) by mouth 3 (three) times daily., Disp: 45 tablet, Rfl: 3   methocarbamol (ROBAXIN) 500 MG tablet, Take 1 tablet (500 mg total) by mouth every 6 (six) hours as needed for muscle spasms., Disp: 30 tablet, Rfl: 0   metoprolol succinate (TOPROL-XL) 100 MG 24 hr tablet, Take 1 tablet (100 mg total) by mouth daily. Take with or immediately following a meal., Disp: 30 tablet, Rfl: 0   pantoprazole (PROTONIX) 40 MG tablet, Take 1 tablet (40 mg total) by mouth daily., Disp: 30 tablet, Rfl: 0   rosuvastatin (CRESTOR) 20 MG tablet, Take 1 tablet (20 mg total) by mouth at bedtime., Disp: 90 tablet, Rfl: 1   nitroGLYCERIN (NITROSTAT) 0.4 MG SL tablet, Place 1 tablet (0.4 mg total) under the tongue every 5 (five) minutes as needed for chest pain., Disp: 30 tablet, Rfl: 0  Orders Placed This Encounter  Procedures   Basic metabolic panel   Magnesium   Pro b natriuretic peptide (BNP)   EKG 12-Lead   --Continue cardiac medications as reconciled in final medication list. --Return in about 3 weeks (around 06/15/2021) for Follow up, CAD. Or sooner if needed. --Continue follow-up with your primary care physician regarding the management of your other chronic comorbid conditions.  Patient's questions and concerns were addressed to her satisfaction. She voices understanding of the instructions provided during this encounter.   This note was created using a voice recognition software as a result there may be grammatical errors inadvertently enclosed that do not reflect the nature of this encounter. Every attempt is made to correct such errors.  Rex Kras, [place], [facility]  Pager: [phone] Office: [phone]

## 2021-05-28 ENCOUNTER — Other Ambulatory Visit: Payer: Self-pay

## 2021-05-28 DIAGNOSIS — I25118 Atherosclerotic heart disease of native coronary artery with other forms of angina pectoris: Secondary | ICD-10-CM

## 2021-05-28 DIAGNOSIS — I5032 Chronic diastolic (congestive) heart failure: Secondary | ICD-10-CM

## 2021-05-31 ENCOUNTER — Other Ambulatory Visit: Payer: Self-pay | Admitting: Cardiology

## 2021-06-01 DIAGNOSIS — I252 Old myocardial infarction: Secondary | ICD-10-CM | POA: Diagnosis not present

## 2021-06-01 DIAGNOSIS — R059 Cough, unspecified: Secondary | ICD-10-CM | POA: Diagnosis not present

## 2021-06-01 DIAGNOSIS — I25118 Atherosclerotic heart disease of native coronary artery with other forms of angina pectoris: Secondary | ICD-10-CM | POA: Diagnosis not present

## 2021-06-01 DIAGNOSIS — N184 Chronic kidney disease, stage 4 (severe): Secondary | ICD-10-CM | POA: Diagnosis not present

## 2021-06-03 DIAGNOSIS — R5381 Other malaise: Secondary | ICD-10-CM | POA: Diagnosis not present

## 2021-06-03 DIAGNOSIS — I5022 Chronic systolic (congestive) heart failure: Secondary | ICD-10-CM | POA: Diagnosis not present

## 2021-06-03 DIAGNOSIS — M48061 Spinal stenosis, lumbar region without neurogenic claudication: Secondary | ICD-10-CM | POA: Diagnosis not present

## 2021-06-05 ENCOUNTER — Other Ambulatory Visit: Payer: Self-pay | Admitting: Cardiology

## 2021-06-12 ENCOUNTER — Telehealth: Payer: Self-pay

## 2021-06-12 NOTE — Telephone Encounter (Signed)
Patients daughter Rema Fendt called patient wants to get pre approval process started for a gel injection call [phone]

## 2021-06-13 ENCOUNTER — Other Ambulatory Visit: Payer: Self-pay | Admitting: Cardiology

## 2021-06-13 NOTE — Telephone Encounter (Signed)
Noted  

## 2021-06-15 ENCOUNTER — Other Ambulatory Visit: Payer: Self-pay | Admitting: Cardiology

## 2021-06-22 ENCOUNTER — Ambulatory Visit: Payer: Medicare HMO | Admitting: Cardiology

## 2021-06-22 ENCOUNTER — Other Ambulatory Visit: Payer: Self-pay

## 2021-06-22 ENCOUNTER — Encounter: Payer: Self-pay | Admitting: Cardiology

## 2021-06-22 VITALS — BP 143/83 | HR 100 | Resp 16 | Ht 64.0 in | Wt 200.0 lb

## 2021-06-22 DIAGNOSIS — I480 Paroxysmal atrial fibrillation: Secondary | ICD-10-CM

## 2021-06-22 DIAGNOSIS — I5032 Chronic diastolic (congestive) heart failure: Secondary | ICD-10-CM | POA: Diagnosis not present

## 2021-06-22 DIAGNOSIS — Z955 Presence of coronary angioplasty implant and graft: Secondary | ICD-10-CM

## 2021-06-22 DIAGNOSIS — N1832 Chronic kidney disease, stage 3b: Secondary | ICD-10-CM

## 2021-06-22 DIAGNOSIS — E1122 Type 2 diabetes mellitus with diabetic chronic kidney disease: Secondary | ICD-10-CM | POA: Diagnosis not present

## 2021-06-22 DIAGNOSIS — Z8673 Personal history of transient ischemic attack (TIA), and cerebral infarction without residual deficits: Secondary | ICD-10-CM

## 2021-06-22 DIAGNOSIS — E782 Mixed hyperlipidemia: Secondary | ICD-10-CM | POA: Diagnosis not present

## 2021-06-22 DIAGNOSIS — Z7901 Long term (current) use of anticoagulants: Secondary | ICD-10-CM | POA: Diagnosis not present

## 2021-06-22 DIAGNOSIS — I25118 Atherosclerotic heart disease of native coronary artery with other forms of angina pectoris: Secondary | ICD-10-CM

## 2021-06-22 MED ORDER — HYDRALAZINE HCL 100 MG PO TABS: 100.0000 mg | ORAL_TABLET | Freq: Three times a day (TID) | ORAL | 2 refills | Status: DC

## 2021-06-22 MED ORDER — CARVEDILOL 3.125 MG PO TABS: 3.1250 mg | ORAL_TABLET | Freq: Two times a day (BID) | ORAL | 0 refills | Status: DC

## 2021-06-22 MED ORDER — ISOSORBIDE MONONITRATE ER 120 MG PO TB24: 120.0000 mg | ORAL_TABLET | Freq: Every day | ORAL | 0 refills | Status: DC

## 2021-06-22 NOTE — Progress Notes (Signed)
Kristin [name] Date of Birth: Dec 04, 1941 MRN: [id number] Primary Care Provider:Hagler, Apolonio Schneiders, MD Primary Cardiologist: Rex Kras, DO, [facility] (established care 08/22/2020)  Date: 06/22/21 Last Office Visit: 05/25/2021  Chief Complaint  Patient presents with   Atherosclerosis of native coronary artery of native heart w   Follow-up    HPI  Kristin [name] is a 80 y.o.  female who presents to the office with a chief complaint of " 1 month follow-up for chest pain evaluation." Patient's past medical history and cardiovascular risk factors include: Paroxysmal atrial fibrillation, NSTEMI (08/2020), multivessel CAD, recovered ischemic cardiomyopathy, chronic HFpEF/stage C/NYHA class II, acute cerebellar stroke, lumbar stenosis status post laminectomy, hypertension, CKD stage IV, diabetes miellitus type 2, postmenopausal female, advanced age.   Patient presents to the office accompanied by her daughter [name].   Patient establish care back in September 2021 while she was hospitalized for bilateral lower extremity weakness and back pain.  She had undergone L2-L4 laminectomy and subsequently was diagnosed with non-STEMI with a high sensitive troponin greater than 27,000.  She also had new onset of atrial fibrillation.  Due to recent neurosurgery she was not a candidate for full oral anticoagulation.  During the same hospitalization she also had another stroke involving the cerebellar distribution.  Since then she has been managed medically with guideline directed medical therapy.  Until recently presented to the ED in June 2022 with chest pain suggestive of angina pectoris.  Troponins were elevated and underwent left heart catheterization and was noted to have LAD/RCA disease.  After a long discussion with the patient and family discussing goals of care the shared decision was to proceed with coronary intervention.  Patient successfully underwent PCI to the proximal/mid LAD and was discharged home in  stable condition.  At the last office visit patient was complaining of continued angina pectoris.  Her medications were uptitrated and now presents for 1 month follow-up.  Patient states that when she is at rest she is relatively stable but with effort related activities she does experience chest pain but it is manageable.  She is required a total of 3 sublingual nitroglycerin tablets since June 2022.  And has stopped taking metoprolol because she thinks it exacerbates her angina.  Her shortness of breath with effort related activities remains chronic and stable.  FUNCTIONAL STATUS: No structured exercise program or daily routine.   ALLERGIES: No Known Allergies  MEDICATION LIST PRIOR TO VISIT: Current Outpatient Medications on File Prior to Visit  Medication Sig Dispense Refill   benzonatate (TESSALON) 100 MG capsule Take 100 mg by mouth 3 (three) times daily as needed.     cholecalciferol (VITAMIN D) 25 MCG (1000 UNIT) tablet Take 3,000 Units by mouth daily.     clopidogrel (PLAVIX) 75 MG tablet Take 1 tablet (75 mg total) by mouth daily with breakfast. 90 tablet 0   colchicine 0.6 MG tablet Take 0.5 tablets (0.3 mg total) by mouth daily. 15 tablet 0   diclofenac Sodium (VOLTAREN) 1 % GEL Apply 2 g topically 4 (four) times daily.     ELIQUIS 2.5 MG TABS tablet Take 1 tablet by mouth twice daily (Patient taking differently: Take 2.5 mg by mouth 2 (two) times daily.) 60 tablet 6   gabapentin (NEURONTIN) 100 MG capsule Take 200 mg by mouth at bedtime.     nitroGLYCERIN (NITROSTAT) 0.4 MG SL tablet Place 1 tablet (0.4 mg total) under the tongue every 5 (five) minutes as needed for chest pain. 30 tablet 0  pantoprazole (PROTONIX) 40 MG tablet Take 1 tablet (40 mg total) by mouth daily. 30 tablet 0   rosuvastatin (CRESTOR) 20 MG tablet Take 1 tablet (20 mg total) by mouth at bedtime. 90 tablet 1   No current facility-administered medications on file prior to visit.    PAST MEDICAL  HISTORY: Past Medical History:  Diagnosis Date   Atrial fibrillation (HCC)    CHF (congestive heart failure) ([place])    Coronary artery disease    Diabetes mellitus without complication ([place])    Hypercholesteremia    Hypertension    Renal disorder    CKD Stage IV   Stroke ([place])     PAST SURGICAL HISTORY: Past Surgical History:  Procedure Laterality Date   CORONARY STENT INTERVENTION N/A 05/15/2021   Procedure: CORONARY STENT INTERVENTION;  Surgeon: Nigel Mormon, MD;  Location: [facility];  Service: Cardiovascular;  Laterality: N/A;   LEFT HEART CATH AND CORONARY ANGIOGRAPHY N/A 05/11/2021   Procedure: LEFT HEART CATH AND CORONARY ANGIOGRAPHY;  Surgeon: Nigel Mormon, MD;  Location: [facility];  Service: Cardiovascular;  Laterality: N/A;   LUMBAR LAMINECTOMY/DECOMPRESSION MICRODISCECTOMY N/A 08/19/2020   Procedure: LUMBAR LAMINECTOMY/DECOMPRESSION  Lumbar two-three, Lumbar three-four;  Surgeon: Dawley, Theodoro Doing, DO;  Location: [facility];  Service: Neurosurgery;  Laterality: N/A;   RIGHT/LEFT HEART CATH AND CORONARY ANGIOGRAPHY N/A 08/23/2020   Procedure: RIGHT/LEFT HEART CATH AND CORONARY ANGIOGRAPHY;  Surgeon: [name], MD;  Location: [facility];  Service: Cardiovascular;  Laterality: N/A;    FAMILY HISTORY: The patient's family history includes Diabetes in her father; Heart attack in her father; Heart disease in her mother; Hypertension in her sister, sister, sister, sister, sister, sister, sister, sister, sister, sister, sister, and sister.   SOCIAL HISTORY:  The patient  reports that she has never smoked. She has never used smokeless tobacco. She reports that she does not drink alcohol and does not use drugs.  Review of Systems  Constitutional: Negative for chills and fever.  HENT:  Negative for hoarse voice and nosebleeds.        Difficultly hearing   Eyes:  Negative for blurred vision, discharge, double vision and pain.  Cardiovascular:  Positive  for chest pain. Negative for claudication, dyspnea on exertion, leg swelling, near-syncope, orthopnea, palpitations, paroxysmal nocturnal dyspnea and syncope.  Respiratory:  Positive for shortness of breath. Negative for hemoptysis.   Musculoskeletal:  Positive for back pain and joint pain. Negative for muscle cramps and myalgias.       Unsteady with gait, but no falls.   Gastrointestinal:  Negative for abdominal pain, constipation, diarrhea, hematemesis, hematochezia, melena, nausea and vomiting.  Neurological:  Positive for paresthesias (hands and legs). Negative for dizziness and light-headedness.   PHYSICAL EXAM: Vitals with BMI 06/22/2021 06/22/2021 05/25/2021  Height - 5' 4"  5' 4"   Weight - 200 lbs 204 lbs  BMI - 47.09 35  Systolic 628 366 294  Diastolic 83 90 80  Pulse 765 100 72   CONSTITUTIONAL: Age-appropriate female, hemodynamically stable, no acute distress.  SKIN: Skin is warm and dry. No rash noted. No cyanosis. No pallor. No jaundice HEAD: Normocephalic and atraumatic.  EYES: No scleral icterus MOUTH/THROAT: Moist oral membranes.  NECK: No JVD present. No thyromegaly noted. No carotid bruits  LYMPHATIC: No visible cervical adenopathy.  CHEST Normal respiratory effort. No intercostal retractions  LUNGS: Clear to auscultation bilaterally.  No stridor. No wheezes. No rales.  CARDIOVASCULAR: Regular rate and rhythm, positive S1-S2,  soft holosystolic murmur heard at the apex, no rubs or gallops appreciated ABDOMINAL: Obese, soft, nontender, nondistended, positive bowel sounds in all 4 quadrants, no apparent ascites.  EXTREMITIES: No peripheral edema  HEMATOLOGIC: No significant bruising NEUROLOGIC: Oriented to person, place, and time. Nonfocal. Normal muscle tone.  PSYCHIATRIC: Normal mood and affect. Normal behavior. Cooperative  CARDIAC DATABASE: EKG: 05/25/2021: Normal sinus rhythm, 69 bpm, left atrial enlargement, old anteroseptal infarct, without underlying ischemia or  injury pattern.   Echocardiogram:  1. Left ventricular ejection fraction, by estimation, is 45 to 50%. The  left ventricle has normal function. The left ventricle demonstrates  regional wall motion abnormalities (see scoring diagram/findings for description). Left ventricular diastolic function could not be evaluated. There is akinesis of the left ventricular, mid-apical anterior wall and anteroseptal wall. There is akinesis of the left ventricular, apical segment.   2. Right ventricular systolic function is normal. The right ventricular size is normal.   3. Left atrial size was severely dilated.   4. The mitral valve is grossly normal. Mild mitral valve regurgitation.   5. The aortic valve is tricuspid. Aortic valve regurgitation is not  visualized.   6. The inferior vena cava is normal in size with <50% respiratory variability, suggesting right atrial pressure of 8 mmHg.   7. Compared to previous study on 01-04-21, LVEF is marginally lower from  50-55%. Wall motion abnormalities are more pronounced.  01-04-21 1. The distal inferoseptum and apex appear mildly hypokinetic, but WMA is much improved compared with prior study. LVEF has improved considerably. Left ventricular ejection fraction, by estimation, is 55 to 60%. Left ventricular ejection fraction by 3D volume is 56%. The left ventricle has normal function. The left ventricle demonstrates regional wall motion abnormalities (see scoring diagram/findings for description). Left ventricular diastolic parameters are consistent with Grade I diastolic dysfunction (impaired relaxation). 2. Right ventricular systolic function is normal. The right ventricular size is normal. There is normal pulmonary artery systolic pressure. The estimated right ventricular systolic pressure is 06.2 mmHg. 3. The mitral valve is grossly normal. Trivial mitral valve regurgitation. No evidence of mitral stenosis. 4. The aortic valve is tricuspid. There is mild calcification  of the aortic valve. There is mild thickening of the aortic valve. Aortic valve regurgitation is not visualized. Mild aortic valve sclerosis is present, with no evidence of aortic valve stenosis. 5. The inferior vena cava is normal in size with greater than 50% respiratory variability, suggesting right atrial pressure of 3 mmHg.  Stress Testing:  No results found for this or any previous visit from the past 1095 days.  Heart Catheterization: 05/11/2021 LM: Normal LAD: Prox 60% stenosis        Mid 95% stenosis just prox to large bifurcating diag        Diag ostium does not seem to be involved.        (Prior cath in 08/2020 noted prox 80% stenosis, followed by long mid to apical LAD occlusion, which seems to have recanalized) LCx: No significant disease RCA: Ostial 90% stenosis         (Significant dampening of pressure noted)         RPL 60% stenosis RCA and LCx system seems largely unchanged compared to 08/2020   Two vessel CAD (Prox-mid LAD and ostial RCA) Elevated LVEDP 29 mmHg   Given acute HFrEF, recommend staged revascularization. Options include CABG (Grafts to LAD, Diag, and RCA) vs PCI to LAD and RCA. Recommend medical management of heart failure over the  weekend, followed by revascularization.  05/15/2021: Prox-mid LAD 60-99% stenosis   Successful percutaneous coronary intervention mid-prox LAD PTCA and stent placement 3.5 X 32 mm Synergy drug-eluting stent Proximal post dilatation using 4.0X15 mm balloon at 22 atm   99-->0% residual stenosis TIMI flow I-->III   Patient is also on eliquis for Afib Recommend Aspirin, plavix, and eliquis for 1 month After that, discontinue Aspirin and continue plavix and eliquis for at least 1 year After 1 year, could consider stopping plavix   Recommend symptoms guided management for ostial RCA stenosis Continue GDMT for HFrEF  LABORATORY DATA: CBC Latest Ref Rng & Units 05/16/2021 05/15/2021 05/14/2021  WBC 4.0 - 10.5 K/uL 6.3 6.1 5.5   Hemoglobin 12.0 - 15.0 g/dL 8.7(L) 9.4(L) 9.8(L)  Hematocrit 36.0 - 46.0 % 27.1(L) 29.6(L) 30.7(L)  Platelets 150 - 400 K/uL 158 171 158    CMP Latest Ref Rng & Units 05/23/2021 05/16/2021 05/15/2021  Glucose 65 - 99 mg/dL 75 91 107(H)  BUN 8 - 27 mg/dL 19 18 27(H)  Creatinine 0.57 - 1.00 mg/dL 1.63(H) 1.61(H) 1.73(H)  Sodium 134 - 144 mmol/L 145(H) 137 137  Potassium 3.5 - 5.2 mmol/L 4.6 4.3 4.0  Chloride 96 - 106 mmol/L 106 106 102  CO2 20 - 29 mmol/L 22 25 29   Calcium 8.7 - 10.3 mg/dL 9.2 8.9 9.1  Total Protein 6.5 - 8.1 g/dL - - -  Total Bilirubin 0.3 - 1.2 mg/dL - - -  Alkaline Phos 38 - 126 U/L - - -  AST 15 - 41 U/L - - -  ALT 0 - 44 U/L - - -    Lipid Panel     Component Value Date/Time   CHOL 130 08/29/2020 0250   TRIG 89 08/29/2020 0250   HDL 47 08/29/2020 0250   CHOLHDL 2.8 08/29/2020 0250   VLDL 18 08/29/2020 0250   LDLCALC 65 08/29/2020 0250    Lab Results  Component Value Date   HGBA1C 6.1 (H) 05/11/2021   HGBA1C 6.7 (H) 08/27/2020   No components found for: NTPROBNP Lab Results  Component Value Date   TSH 1.054 08/22/2020   TSH 1.190 03/13/2020    Cardiac Panel (last 3 results) No results for input(s): CKTOTAL, CKMB, TROPONINIHS, RELINDX in the last 72 hours.  External Labs: Collected: 03/16/2021 performed by her PCP Creatinine 2.02 mg/dL. eGFR: 24 mL/min per 1.73 m Hemoglobin 12 g/dL, hematocrit 36.9% Sodium 145, potassium 3.6, chloride 101, bicarb 36, BUN 38 TSH 1.62  IMPRESSION:    ICD-10-CM   1. Atherosclerosis of native coronary artery of native heart with stable angina pectoris (HCC)  I25.118 carvedilol (COREG) 3.125 MG tablet    isosorbide mononitrate (IMDUR) 120 MG 24 hr tablet    hydrALAZINE (APRESOLINE) 100 MG tablet    PCV ECHOCARDIOGRAM COMPLETE    2. History of coronary angioplasty with insertion of stent  Z95.5     3. Chronic heart failure with preserved ejection fraction (HFpEF) (HCC)  I50.32     4. Paroxysmal atrial  fibrillation (HCC)  I48.0     5. Long term (current) use of anticoagulants  Z79.01     6. Mixed hyperlipidemia  E78.2     7. Hx of stroke  Z86.73     8. Type 2 diabetes mellitus with stage 3b chronic kidney disease, without long-term current use of insulin [facility])  E11.22    N18.32        RECOMMENDATIONS: Kristin [name] is a 80 y.o. female whose  past medical history and cardiovascular risk factors include: Paroxysmal atrial fibrillation, NSTEMI (08/2020), multivessel CAD, ischemic cardiomyopathy, chronic heart failure with reduced EF/stage C/NYHA class II, acute cerebellar stroke, lumbar stenosis status post laminectomy, hypertension, CKD stage IV, diabetes miellitus type 2, postmenopausal female, advanced age.   Atherosclerosis of the native coronary arteries with angina pectoris status post PCI: Patient complains of symptoms similar to her recent hospitalization. She underwent PCI to the proximal/mid LAD on May 15, 2021. Recommended to continue Eliquis and Plavix for 1 year. Patient has discontinued metoprolol since last office visit as she claims exacerbates her angina. The shared decision was to uptitrate antianginal therapy for now until unless her symptoms continue to increase in intensity, frequency, and/or duration she will seek medical attention by going to the closest ER via EMS. Transition Isordil to isosorbide mononitrate 120 mg p.o. daily Increase hydralazine 200 mg p.o. 3 times daily Will start carvedilol 3.125 mg p.o. twice daily Hesitant to start Cardizem due to mildly reduced LVEF Plan echocardiogram prior to the next office visit.  Paroxysmal atrial fibrillation:  Currently normal sinus rhythm.  Will start carvedilol, patient does not want to be on Toprol-XL Continue Eliquis.   She remains in normal sinus rhythm despite discontinuation of amiodarone.  Long-term oral anticoagulation: CHA2DS2-VASc SCORE is 9 which correlates to 15.2 % risk of stroke per year (CHF,  hypertension, age greater than 32, diabetes, stroke, history of non-STEMI, female). Reemphasized the risks, benefits, and alternatives to oral anticoagulation.   Chronic heart failure with preserved EF, stage C, NYHA class II: Continue current medical therapy. Medications reconciled. Transition her from hydralazine/Isordil to hydralazine and Imdur as discussed above Will consider reinitiation of Arni at the next office visit, she has an appointment with nephrology next week Recommend daily weight check, strict I/O's Fluid restriction to <2L per day, Na restriction < 2g per day  Type 2 diabetes mellitus, non-insulin-dependent: Currently managed by primary care provider.  Mixed Hyperlipidemia:  Continue statin therapy.   Follow lipids. Currently managed by primary care provider. Patient denies myalgia or other side effects. Most recent lipid profile reviewed with the patient. Most recent AST and ALT values reviewed with the patient.  FINAL MEDICATION LIST END OF ENCOUNTER: Meds ordered this encounter  Medications   carvedilol (COREG) 3.125 MG tablet    Sig: Take 1 tablet (3.125 mg total) by mouth 2 (two) times daily with a meal.    Dispense:  60 tablet    Refill:  0   isosorbide mononitrate (IMDUR) 120 MG 24 hr tablet    Sig: Take 1 tablet (120 mg total) by mouth daily.    Dispense:  30 tablet    Refill:  0   hydrALAZINE (APRESOLINE) 100 MG tablet    Sig: Take 1 tablet (100 mg total) by mouth 3 (three) times daily.    Dispense:  90 tablet    Refill:  2     Current Outpatient Medications:    benzonatate (TESSALON) 100 MG capsule, Take 100 mg by mouth 3 (three) times daily as needed., Disp: , Rfl:    carvedilol (COREG) 3.125 MG tablet, Take 1 tablet (3.125 mg total) by mouth 2 (two) times daily with a meal., Disp: 60 tablet, Rfl: 0   cholecalciferol (VITAMIN D) 25 MCG (1000 UNIT) tablet, Take 3,000 Units by mouth daily., Disp: , Rfl:    clopidogrel (PLAVIX) 75 MG tablet, Take 1  tablet (75 mg total) by mouth daily with breakfast., Disp: 90 tablet, Rfl: 0  colchicine 0.6 MG tablet, Take 0.5 tablets (0.3 mg total) by mouth daily., Disp: 15 tablet, Rfl: 0   diclofenac Sodium (VOLTAREN) 1 % GEL, Apply 2 g topically 4 (four) times daily., Disp: , Rfl:    ELIQUIS 2.5 MG TABS tablet, Take 1 tablet by mouth twice daily (Patient taking differently: Take 2.5 mg by mouth 2 (two) times daily.), Disp: 60 tablet, Rfl: 6   gabapentin (NEURONTIN) 100 MG capsule, Take 200 mg by mouth at bedtime., Disp: , Rfl:    hydrALAZINE (APRESOLINE) 100 MG tablet, Take 1 tablet (100 mg total) by mouth 3 (three) times daily., Disp: 90 tablet, Rfl: 2   isosorbide mononitrate (IMDUR) 120 MG 24 hr tablet, Take 1 tablet (120 mg total) by mouth daily., Disp: 30 tablet, Rfl: 0   nitroGLYCERIN (NITROSTAT) 0.4 MG SL tablet, Place 1 tablet (0.4 mg total) under the tongue every 5 (five) minutes as needed for chest pain., Disp: 30 tablet, Rfl: 0   pantoprazole (PROTONIX) 40 MG tablet, Take 1 tablet (40 mg total) by mouth daily., Disp: 30 tablet, Rfl: 0   rosuvastatin (CRESTOR) 20 MG tablet, Take 1 tablet (20 mg total) by mouth at bedtime., Disp: 90 tablet, Rfl: 1  Orders Placed This Encounter  Procedures   PCV ECHOCARDIOGRAM COMPLETE   --Continue cardiac medications as reconciled in final medication list. --Return in 4 weeks (on 07/20/2021) for Follow up, CAD, Chest pain. Or sooner if needed. --Continue follow-up with your primary care physician regarding the management of your other chronic comorbid conditions.  Patient's questions and concerns were addressed to her satisfaction. She voices understanding of the instructions provided during this encounter.   This note was created using a voice recognition software as a result there may be grammatical errors inadvertently enclosed that do not reflect the nature of this encounter. Every attempt is made to correct such errors.  Rex Kras, [place], [facility]  Pager:  [phone] Office: [phone]

## 2021-06-29 DIAGNOSIS — I480 Paroxysmal atrial fibrillation: Secondary | ICD-10-CM | POA: Diagnosis not present

## 2021-06-29 DIAGNOSIS — I5020 Unspecified systolic (congestive) heart failure: Secondary | ICD-10-CM | POA: Diagnosis not present

## 2021-06-29 DIAGNOSIS — D519 Vitamin B12 deficiency anemia, unspecified: Secondary | ICD-10-CM | POA: Diagnosis not present

## 2021-06-29 DIAGNOSIS — I129 Hypertensive chronic kidney disease with stage 1 through stage 4 chronic kidney disease, or unspecified chronic kidney disease: Secondary | ICD-10-CM | POA: Diagnosis not present

## 2021-06-29 DIAGNOSIS — I2510 Atherosclerotic heart disease of native coronary artery without angina pectoris: Secondary | ICD-10-CM | POA: Diagnosis not present

## 2021-06-29 DIAGNOSIS — E1122 Type 2 diabetes mellitus with diabetic chronic kidney disease: Secondary | ICD-10-CM | POA: Diagnosis not present

## 2021-06-29 DIAGNOSIS — R202 Paresthesia of skin: Secondary | ICD-10-CM | POA: Diagnosis not present

## 2021-06-29 DIAGNOSIS — N184 Chronic kidney disease, stage 4 (severe): Secondary | ICD-10-CM | POA: Diagnosis not present

## 2021-06-29 DIAGNOSIS — R200 Anesthesia of skin: Secondary | ICD-10-CM | POA: Diagnosis not present

## 2021-07-02 ENCOUNTER — Other Ambulatory Visit: Payer: Self-pay | Admitting: Cardiology

## 2021-07-02 DIAGNOSIS — Z7901 Long term (current) use of anticoagulants: Secondary | ICD-10-CM

## 2021-07-02 DIAGNOSIS — I480 Paroxysmal atrial fibrillation: Secondary | ICD-10-CM

## 2021-07-03 ENCOUNTER — Telehealth: Payer: Self-pay

## 2021-07-03 DIAGNOSIS — R5381 Other malaise: Secondary | ICD-10-CM | POA: Diagnosis not present

## 2021-07-03 DIAGNOSIS — M48061 Spinal stenosis, lumbar region without neurogenic claudication: Secondary | ICD-10-CM | POA: Diagnosis not present

## 2021-07-03 DIAGNOSIS — I5022 Chronic systolic (congestive) heart failure: Secondary | ICD-10-CM | POA: Diagnosis not present

## 2021-07-03 NOTE — Telephone Encounter (Signed)
Submitted for SynviscOne, bilateral knee. Pending BV.

## 2021-07-05 ENCOUNTER — Telehealth: Payer: Self-pay

## 2021-07-05 NOTE — Telephone Encounter (Signed)
PA required for SynviscOne, bilateral knee. Faxed completed PA form to [employer] at [phone].

## 2021-07-06 ENCOUNTER — Telehealth: Payer: Self-pay

## 2021-07-06 ENCOUNTER — Ambulatory Visit: Payer: Medicare HMO | Admitting: Cardiology

## 2021-07-06 ENCOUNTER — Other Ambulatory Visit: Payer: Self-pay

## 2021-07-06 ENCOUNTER — Ambulatory Visit: Payer: Medicare HMO

## 2021-07-06 DIAGNOSIS — I25118 Atherosclerotic heart disease of native coronary artery with other forms of angina pectoris: Secondary | ICD-10-CM | POA: Diagnosis not present

## 2021-07-06 NOTE — Telephone Encounter (Signed)
Approved for SynviscOne, bilateral knee. Buy & Bill Covered at 100% of the allowed amount. Co-pay of $25.00 PA Approval# [id number] Valid 07/05/2021- 10/05/2021  Appt. 07/23/2021 with Dr. Ninfa Linden

## 2021-07-12 NOTE — Progress Notes (Signed)
Called pt, daughter answered inform her aboutt he message above. Pt daughter mention they did faxed over her lab results. I will check with the people up front

## 2021-07-20 ENCOUNTER — Encounter: Payer: Self-pay | Admitting: Cardiology

## 2021-07-20 ENCOUNTER — Telehealth: Payer: Self-pay | Admitting: Orthopaedic Surgery

## 2021-07-20 ENCOUNTER — Other Ambulatory Visit: Payer: Self-pay

## 2021-07-20 ENCOUNTER — Ambulatory Visit: Payer: Medicare HMO | Admitting: Cardiology

## 2021-07-20 VITALS — BP 144/90 | HR 79 | Resp 16 | Ht 64.0 in | Wt 202.0 lb

## 2021-07-20 DIAGNOSIS — E782 Mixed hyperlipidemia: Secondary | ICD-10-CM

## 2021-07-20 DIAGNOSIS — E1122 Type 2 diabetes mellitus with diabetic chronic kidney disease: Secondary | ICD-10-CM

## 2021-07-20 DIAGNOSIS — Z7901 Long term (current) use of anticoagulants: Secondary | ICD-10-CM | POA: Diagnosis not present

## 2021-07-20 DIAGNOSIS — I2510 Atherosclerotic heart disease of native coronary artery without angina pectoris: Secondary | ICD-10-CM

## 2021-07-20 DIAGNOSIS — I480 Paroxysmal atrial fibrillation: Secondary | ICD-10-CM | POA: Diagnosis not present

## 2021-07-20 DIAGNOSIS — Z8673 Personal history of transient ischemic attack (TIA), and cerebral infarction without residual deficits: Secondary | ICD-10-CM

## 2021-07-20 DIAGNOSIS — Z955 Presence of coronary angioplasty implant and graft: Secondary | ICD-10-CM

## 2021-07-20 DIAGNOSIS — N1832 Chronic kidney disease, stage 3b: Secondary | ICD-10-CM | POA: Diagnosis not present

## 2021-07-20 DIAGNOSIS — I5032 Chronic diastolic (congestive) heart failure: Secondary | ICD-10-CM | POA: Diagnosis not present

## 2021-07-20 DIAGNOSIS — I25118 Atherosclerotic heart disease of native coronary artery with other forms of angina pectoris: Secondary | ICD-10-CM

## 2021-07-20 MED ORDER — ISOSORBIDE MONONITRATE ER 120 MG PO TB24: 120.0000 mg | ORAL_TABLET | Freq: Every day | ORAL | 0 refills | Status: DC

## 2021-07-20 MED ORDER — DAPAGLIFLOZIN PROPANEDIOL 10 MG PO TABS: 10.0000 mg | ORAL_TABLET | Freq: Every day | ORAL | 0 refills | Status: DC

## 2021-07-20 MED ORDER — HYDRALAZINE HCL 100 MG PO TABS: 100.0000 mg | ORAL_TABLET | Freq: Three times a day (TID) | ORAL | 0 refills | Status: DC

## 2021-07-20 MED ORDER — CARVEDILOL 3.125 MG PO TABS: 3.1250 mg | ORAL_TABLET | Freq: Two times a day (BID) | ORAL | 0 refills | Status: DC

## 2021-07-20 NOTE — Progress Notes (Signed)
Kristin [name] Date of Birth: 07-03-1941 MRN: [id number] Primary Care Provider:Hagler, Apolonio Schneiders, MD Primary Cardiologist: Rex Kras, DO, [facility] (established care 08/22/2020) Primary nephrologist: Dr. Santiago Bumpers  Date: 07/20/21 Last Office Visit: 06/22/2021  Chief Complaint  Patient presents with   Coronary Artery Disease   Chest Pain   Follow-up    HPI  Kristin [name] is a 80 y.o.  female who presents to the office with a chief complaint of " heart failure and CAD management." Patient's past medical history and cardiovascular risk factors include: Paroxysmal atrial fibrillation, NSTEMI (08/2020), multivessel CAD, recovered ischemic cardiomyopathy, chronic HFpEF/stage C/NYHA class II, hx of cerebellar stroke, lumbar stenosis status post laminectomy, hypertension, CKD stage IV, diabetes miellitus type 2, postmenopausal female, advanced age.   Patient presents to the office accompanied by her daughter Kristin [name].   Patient establish care back in September 2021 while she was hospitalized for bilateral lower extremity weakness and back pain.  She had undergone L2-L4 laminectomy and subsequently was diagnosed with non-STEMI with a hs troponin greater than 27,000.  She also developed new onset of atrial fibrillation during that hospitalization and due to not being a anticoagulant candidate during the hospitalization due to recent neurosurgery she also had a stroke involving the cerebellar distribution.  Subsequently was managed medically until recently in June 2022 went to ED for chest pain suggestive of angina pectoris.  She underwent left heart catheterization and was found to have LAD/RCA disease and after discussing the risks versus benefit of high risk PCI versus CABG the shared decision was to proceed with coronary intervention.  She underwent PCI to the proximal/mid LAD successfully prior to discharge.  At the last visit she continued to have chest pain suggestive of angina pectoris.  EKG  did not illustrate myocardial injury pattern.  She also has discontinued metoprolol succinate due to her thinking it is causing her angina pectoris.  The shared decision was to uptitrate antianginal therapy and hydralazine was increased to 100 mg p.o. 3 times daily, Isordil changed to isosorbide mononitrate, and added carvedilol 3.125 mg p.o. twice daily.  She was also recommended to have an echocardiogram.  Since up titration of antianginal therapy she has not been having chest pain or shortness of breath with day-to-day activities.  Her shortness of breath usually resurfaces with overexertion.  Echocardiogram notes relatively stable LVEF with regional wall motion abnormalities as discussed above but does have mild progression of her valvular heart disease and pulmonary hypertension.  She followed up with her nephrologist on June 29, 2021's office notes reviewed as a part of today's encounter and lab work independently reviewed and noted below for reference.  NT proBNP slightly elevated and renal function relatively stable.  FUNCTIONAL STATUS: No structured exercise program or daily routine.   ALLERGIES: No Known Allergies  MEDICATION LIST PRIOR TO VISIT: Current Outpatient Medications on File Prior to Visit  Medication Sig Dispense Refill   benzonatate (TESSALON) 100 MG capsule Take 100 mg by mouth 3 (three) times daily as needed.     cholecalciferol (VITAMIN D) 25 MCG (1000 UNIT) tablet Take 3,000 Units by mouth daily.     clopidogrel (PLAVIX) 75 MG tablet Take 1 tablet (75 mg total) by mouth daily with breakfast. 90 tablet 0   colchicine 0.6 MG tablet Take 0.5 tablets (0.3 mg total) by mouth daily. 15 tablet 0   diclofenac Sodium (VOLTAREN) 1 % GEL Apply 2 g topically 4 (four) times daily.     ELIQUIS 2.5 MG TABS tablet  Take 1 tablet by mouth twice daily 60 tablet 0   gabapentin (NEURONTIN) 100 MG capsule Take 200 mg by mouth at bedtime.     nitroGLYCERIN (NITROSTAT) 0.4 MG SL tablet Place 1  tablet (0.4 mg total) under the tongue every 5 (five) minutes as needed for chest pain. 30 tablet 0   pantoprazole (PROTONIX) 40 MG tablet Take 1 tablet (40 mg total) by mouth daily. 30 tablet 0   rosuvastatin (CRESTOR) 20 MG tablet Take 1 tablet (20 mg total) by mouth at bedtime. 90 tablet 1   No current facility-administered medications on file prior to visit.    PAST MEDICAL HISTORY: Past Medical History:  Diagnosis Date   Atrial fibrillation (HCC)    CHF (congestive heart failure) ([place])    Coronary artery disease    Diabetes mellitus without complication ([place])    Hypercholesteremia    Hypertension    Renal disorder    CKD Stage IV   Stroke ([place])     PAST SURGICAL HISTORY: Past Surgical History:  Procedure Laterality Date   CORONARY STENT INTERVENTION N/A 05/15/2021   Procedure: CORONARY STENT INTERVENTION;  Surgeon: Nigel Mormon, MD;  Location: [place] [facility];  Service: Cardiovascular;  Laterality: N/A;   LEFT HEART CATH AND CORONARY ANGIOGRAPHY N/A 05/11/2021   Procedure: LEFT HEART CATH AND CORONARY ANGIOGRAPHY;  Surgeon: Nigel Mormon, MD;  Location: [place] [facility];  Service: Cardiovascular;  Laterality: N/A;   LUMBAR LAMINECTOMY/DECOMPRESSION MICRODISCECTOMY N/A 08/19/2020   Procedure: LUMBAR LAMINECTOMY/DECOMPRESSION  Lumbar two-three, Lumbar three-four;  Surgeon: Dawley, Theodoro Doing, DO;  Location: [facility];  Service: Neurosurgery;  Laterality: N/A;   RIGHT/LEFT HEART CATH AND CORONARY ANGIOGRAPHY N/A 08/23/2020   Procedure: RIGHT/LEFT HEART CATH AND CORONARY ANGIOGRAPHY;  Surgeon: [name], MD;  Location: [place] [facility];  Service: Cardiovascular;  Laterality: N/A;    FAMILY HISTORY: The patient's family history includes Diabetes in her father; Heart attack in her father; Heart disease in her mother; Hypertension in her sister, sister, sister, sister, sister, sister, sister, sister, sister, sister, sister, and sister.   SOCIAL HISTORY:  The patient   reports that she has never smoked. She has never used smokeless tobacco. She reports that she does not drink alcohol and does not use drugs.  Review of Systems  Constitutional: Negative for chills and fever.  HENT:  Negative for hoarse voice and nosebleeds.        Difficultly hearing   Eyes:  Negative for blurred vision, discharge, double vision and pain.  Cardiovascular:  Positive for dyspnea on exertion (with overexertion). Negative for chest pain, claudication, leg swelling, near-syncope, orthopnea, palpitations, paroxysmal nocturnal dyspnea and syncope.  Respiratory:  Negative for hemoptysis and shortness of breath.   Musculoskeletal:  Positive for back pain and joint pain. Negative for muscle cramps and myalgias.       Unsteady with gait, but no falls.   Gastrointestinal:  Negative for abdominal pain, constipation, diarrhea, hematemesis, hematochezia, melena, nausea and vomiting.  Neurological:  Positive for paresthesias (hands and legs). Negative for dizziness and light-headedness.   PHYSICAL EXAM: Vitals with BMI 07/20/2021 07/20/2021 06/22/2021  Height - 5' 4"  -  Weight - 202 lbs -  BMI - 88.41 -  Systolic 660 630 160  Diastolic 90 93 83  Pulse 79 86 100   CONSTITUTIONAL: Age-appropriate female, hemodynamically stable, no acute distress.  SKIN: Skin is warm and dry. No rash noted. No cyanosis. No pallor. No jaundice HEAD: Normocephalic and  atraumatic.  EYES: No scleral icterus MOUTH/THROAT: Moist oral membranes.  NECK: No JVD present. No thyromegaly noted. No carotid bruits  LYMPHATIC: No visible cervical adenopathy.  CHEST Normal respiratory effort. No intercostal retractions  LUNGS: Clear to auscultation bilaterally.  No stridor. No wheezes. No rales.  CARDIOVASCULAR: Regular rate and rhythm, positive W4-X3, soft holosystolic murmur heard at the apex, no rubs or gallops appreciated ABDOMINAL: Obese, soft, nontender, nondistended, positive bowel sounds in all 4 quadrants, no  apparent ascites.  EXTREMITIES: No peripheral edema, warm to touch, 2+ DP and PT pulses. HEMATOLOGIC: No significant bruising NEUROLOGIC: Oriented to person, place, and time. Nonfocal. Normal muscle tone.  PSYCHIATRIC: Normal mood and affect. Normal behavior. Cooperative  CARDIAC DATABASE: EKG: 05/25/2021: Normal sinus rhythm, 69 bpm, left atrial enlargement, old anteroseptal infarct, without underlying ischemia or injury pattern.   Echocardiogram: 12/15/2020 1. The distal inferoseptum and apex appear mildly hypokinetic, but WMA is much improved compared with prior study. LVEF has improved considerably. Left ventricular ejection fraction, by estimation, is 55 to 60%. Left ventricular ejection fraction by 3D volume is 56%. The left ventricle has normal function. The left ventricle demonstrates regional wall motion abnormalities (see scoring diagram/findings for description). Left ventricular diastolic parameters are consistent with Grade I diastolic dysfunction (impaired relaxation). 2. Right ventricular systolic function is normal. The right ventricular size is normal. There is normal pulmonary artery systolic pressure. The estimated right ventricular systolic pressure is 24.4 mmHg. 3. The mitral valve is grossly normal. Trivial mitral valve regurgitation. No evidence of mitral stenosis. 4. The aortic valve is tricuspid. There is mild calcification of the aortic valve. There is mild thickening of the aortic valve. Aortic valve regurgitation is not visualized. Mild aortic valve sclerosis is present, with no evidence of aortic valve stenosis. 5. The inferior vena cava is normal in size with greater than 50% respiratory variability, suggesting right atrial pressure of 3 mmHg.  07/06/2021:  Left ventricle cavity is normal in size. Mild concentric hypertrophy of the left ventricle. Mid to distal anteroseptal/anterior mild hypokinesis.  Mild global hypokinesis. LVEF 40-45%. Doppler evidence of grade II  (pseudonormal) diastolic dysfunction, elevated LAP.  Left atrial cavity is moderately dilated.  Moderate (Grade II) mitral regurgitation. Mildly restricted mitral valve leaflets.  Moderate tricuspid regurgitation. Moderate pulmonary hypertension.  Estimated pulmonary artery systolic pressure 59 mmHg.  Mild pulmonic regurgitation.  Previous study on 05/11/2021 noted akinetic mid-apical anterior wall, anteroseptal wall, and apex. LVEF 45-50%, no pulmonary hypertension.   Stress Testing:  No results found for this or any previous visit from the past 1095 days.  Heart Catheterization: 05/11/2021 LM: Normal LAD: Prox 60% stenosis        Mid 95% stenosis just prox to large bifurcating diag        Diag ostium does not seem to be involved.        (Prior cath in 08/2020 noted prox 80% stenosis, followed by long mid to apical LAD occlusion, which seems to have recanalized) LCx: No significant disease RCA: Ostial 90% stenosis         (Significant dampening of pressure noted)         RPL 60% stenosis RCA and LCx system seems largely unchanged compared to 08/2020   Two vessel CAD (Prox-mid LAD and ostial RCA) Elevated LVEDP 29 mmHg   Given acute HFrEF, recommend staged revascularization. Options include CABG (Grafts to LAD, Diag, and RCA) vs PCI to LAD and RCA. Recommend medical management of heart failure over the weekend,  followed by revascularization.  05/15/2021: Prox-mid LAD 60-99% stenosis   Successful percutaneous coronary intervention mid-prox LAD PTCA and stent placement 3.5 X 32 mm Synergy drug-eluting stent Proximal post dilatation using 4.0X15 mm balloon at 22 atm   99-->0% residual stenosis TIMI flow I-->III   Patient is also on eliquis for Afib Recommend Aspirin, plavix, and eliquis for 1 month After that, discontinue Aspirin and continue plavix and eliquis for at least 1 year After 1 year, could consider stopping plavix   Recommend symptoms guided management for ostial RCA  stenosis Continue GDMT for HFrEF  LABORATORY DATA: CBC Latest Ref Rng & Units 05/16/2021 05/15/2021 05/14/2021  WBC 4.0 - 10.5 K/uL 6.3 6.1 5.5  Hemoglobin 12.0 - 15.0 g/dL 8.7(L) 9.4(L) 9.8(L)  Hematocrit 36.0 - 46.0 % 27.1(L) 29.6(L) 30.7(L)  Platelets 150 - 400 K/uL 158 171 158    CMP Latest Ref Rng & Units 05/23/2021 05/16/2021 05/15/2021  Glucose 65 - 99 mg/dL 75 91 107(H)  BUN 8 - 27 mg/dL 19 18 27(H)  Creatinine 0.57 - 1.00 mg/dL 1.63(H) 1.61(H) 1.73(H)  Sodium 134 - 144 mmol/L 145(H) 137 137  Potassium 3.5 - 5.2 mmol/L 4.6 4.3 4.0  Chloride 96 - 106 mmol/L 106 106 102  CO2 20 - 29 mmol/L 22 25 29   Calcium 8.7 - 10.3 mg/dL 9.2 8.9 9.1  Total Protein 6.5 - 8.1 g/dL - - -  Total Bilirubin 0.3 - 1.2 mg/dL - - -  Alkaline Phos 38 - 126 U/L - - -  AST 15 - 41 U/L - - -  ALT 0 - 44 U/L - - -    Lipid Panel     Component Value Date/Time   CHOL 130 08/29/2020 0250   TRIG 89 08/29/2020 0250   HDL 47 08/29/2020 0250   CHOLHDL 2.8 08/29/2020 0250   VLDL 18 08/29/2020 0250   LDLCALC 65 08/29/2020 0250    Lab Results  Component Value Date   HGBA1C 6.1 (H) 05/11/2021   HGBA1C 6.7 (H) 08/27/2020   No components found for: NTPROBNP Lab Results  Component Value Date   TSH 1.054 08/22/2020   TSH 1.190 03/13/2020    Cardiac Panel (last 3 results) No results for input(s): CKTOTAL, CKMB, TROPONINIHS, RELINDX in the last 72 hours.  External Labs: Collected: 03/16/2021 performed by her PCP Creatinine 2.02 mg/dL. eGFR: 24 mL/min per 1.73 m Hemoglobin 12 g/dL, hematocrit 36.9% Sodium 145, potassium 3.6, chloride 101, bicarb 36, BUN 38 TSH 1.62  External Labs: Collected: 06/29/2021 Creatinine 1.50 mg/dL. eGFR: 35 mL/min per 1.73 m Sodium is 142, potassium 4.7, chloride 106, bicarb 29 Hemoglobin 11.6 g/dL, hematocrit 35.2%. NT proBNP 2604.  IMPRESSION:    ICD-10-CM   1. Atherosclerosis of native coronary artery of native heart without angina pectoris  I25.10 carvedilol  (COREG) 3.125 MG tablet    hydrALAZINE (APRESOLINE) 100 MG tablet    isosorbide mononitrate (IMDUR) 120 MG 24 hr tablet    2. History of coronary angioplasty with insertion of stent  Z95.5     3. Chronic heart failure with preserved ejection fraction (HFpEF) (HCC)  I50.32 dapagliflozin propanediol (FARXIGA) 10 MG TABS tablet    Pro b natriuretic peptide (BNP)    Basic metabolic panel    Magnesium    4. Paroxysmal atrial fibrillation (HCC)  I48.0     5. Long term (current) use of anticoagulants  Z79.01     6. Mixed hyperlipidemia  E78.2     7. Hx of stroke  Z86.73     8. Type 2 diabetes mellitus with stage 3b chronic kidney disease, without long-term current use of insulin [facility])  E11.22    N18.32        RECOMMENDATIONS: Taleeya Blondin is a 80 y.o. female whose past medical history and cardiovascular risk factors include: Paroxysmal atrial fibrillation, NSTEMI (08/2020), multivessel CAD, recovered ischemic cardiomyopathy, chronic HFpEF/stage C/NYHA class II, hx of cerebellar stroke, lumbar stenosis status post laminectomy, hypertension, CKD stage IV, diabetes miellitus type 2, postmenopausal female, advanced age.   Atherosclerosis of the native coronary arteries without angina pectoris status post PCI: Chest pain-free after up titration of antianginal therapy. Has not required sublingual nitroglycerin tablet since last office encounter She underwent PCI to the proximal/mid LAD on May 15, 2021. Recommended to continue Eliquis and Plavix for 1 year. Has not tolerated Toprol XL in the past. Refilled isosorbide mononitrate, hydralazine, and carvedilol. Outside labs independently reviewed and noted above for further reference.  Chronic heart failure with preserved EF, stage C, NYHA class II: Outside labs 06/29/2021 independently reviewed and noted above. Due to the findings of the echocardiogram, uptrending NT proBNP, and dyspnea on exertion with over exertional activities that shared  decision was to initiate Farxiga 7-day sample of [place] provided to the patient.  She is asked to do blood work in 1 week to evaluate her kidney function and NT proBNP after initiation of [place].  If her renal function remained stable patient is asked to pick up the prescription that I have sent to the pharmacy.  And if needed will help apply for patient assistance. Continue current medical therapy. Medications reconciled. Recommend daily weight check, strict I/O's Fluid restriction to <2L per day, Na restriction < 2g per day  Paroxysmal atrial fibrillation:  Currently normal sinus rhythm.  Rate control: Carvedilol. Rhythm control: N/A Thromboembolic prophylaxis: Eliquis.   She remains in normal sinus rhythm despite discontinuation of amiodarone.  Long-term oral anticoagulation: CHA2DS2-VASc SCORE is 9 which correlates to 15.2 % risk of stroke per year (CHF, hypertension, age greater than 33, diabetes, stroke, history of non-STEMI, female). Reemphasized the risks, benefits, and alternatives to oral anticoagulation.   Type 2 diabetes mellitus, non-insulin-dependent: Currently managed by primary care provider.  Mixed Hyperlipidemia:  Continue statin therapy.   Follow lipids. Currently managed by primary care provider. Patient denies myalgia or other side effects.  FINAL MEDICATION LIST END OF ENCOUNTER: Meds ordered this encounter  Medications   carvedilol (COREG) 3.125 MG tablet    Sig: Take 1 tablet (3.125 mg total) by mouth 2 (two) times daily with a meal.    Dispense:  180 tablet    Refill:  0   hydrALAZINE (APRESOLINE) 100 MG tablet    Sig: Take 1 tablet (100 mg total) by mouth 3 (three) times daily.    Dispense:  270 tablet    Refill:  0   isosorbide mononitrate (IMDUR) 120 MG 24 hr tablet    Sig: Take 1 tablet (120 mg total) by mouth daily.    Dispense:  90 tablet    Refill:  0   dapagliflozin propanediol (FARXIGA) 10 MG TABS tablet    Sig: Take 1 tablet (10 mg total)  by mouth daily before breakfast.    Dispense:  30 tablet    Refill:  0      Current Outpatient Medications:    benzonatate (TESSALON) 100 MG capsule, Take 100 mg by mouth 3 (three) times daily as needed., Disp: , Rfl:  cholecalciferol (VITAMIN D) 25 MCG (1000 UNIT) tablet, Take 3,000 Units by mouth daily., Disp: , Rfl:    clopidogrel (PLAVIX) 75 MG tablet, Take 1 tablet (75 mg total) by mouth daily with breakfast., Disp: 90 tablet, Rfl: 0   colchicine 0.6 MG tablet, Take 0.5 tablets (0.3 mg total) by mouth daily., Disp: 15 tablet, Rfl: 0   dapagliflozin propanediol (FARXIGA) 10 MG TABS tablet, Take 1 tablet (10 mg total) by mouth daily before breakfast., Disp: 30 tablet, Rfl: 0   diclofenac Sodium (VOLTAREN) 1 % GEL, Apply 2 g topically 4 (four) times daily., Disp: , Rfl:    ELIQUIS 2.5 MG TABS tablet, Take 1 tablet by mouth twice daily, Disp: 60 tablet, Rfl: 0   gabapentin (NEURONTIN) 100 MG capsule, Take 200 mg by mouth at bedtime., Disp: , Rfl:    nitroGLYCERIN (NITROSTAT) 0.4 MG SL tablet, Place 1 tablet (0.4 mg total) under the tongue every 5 (five) minutes as needed for chest pain., Disp: 30 tablet, Rfl: 0   pantoprazole (PROTONIX) 40 MG tablet, Take 1 tablet (40 mg total) by mouth daily., Disp: 30 tablet, Rfl: 0   rosuvastatin (CRESTOR) 20 MG tablet, Take 1 tablet (20 mg total) by mouth at bedtime., Disp: 90 tablet, Rfl: 1   carvedilol (COREG) 3.125 MG tablet, Take 1 tablet (3.125 mg total) by mouth 2 (two) times daily with a meal., Disp: 180 tablet, Rfl: 0   hydrALAZINE (APRESOLINE) 100 MG tablet, Take 1 tablet (100 mg total) by mouth 3 (three) times daily., Disp: 270 tablet, Rfl: 0   isosorbide mononitrate (IMDUR) 120 MG 24 hr tablet, Take 1 tablet (120 mg total) by mouth daily., Disp: 90 tablet, Rfl: 0  Orders Placed This Encounter  Procedures   Pro b natriuretic peptide (BNP)   Basic metabolic panel   Magnesium    --Continue cardiac medications as reconciled in final  medication list. --Return in about 3 months (around 10/20/2021) for Follow up, Coronary artery calcification, heart failure management., A. fib. Or sooner if needed. --Continue follow-up with your primary care physician regarding the management of your other chronic comorbid conditions.  Patient's questions and concerns were addressed to her satisfaction. She voices understanding of the instructions provided during this encounter.   This note was created using a voice recognition software as a result there may be grammatical errors inadvertently enclosed that do not reflect the nature of this encounter. Every attempt is made to correct such errors.  Total time spent at least 40 minutes discussing/reevaluating angina in the setting of complex CAD, up titration of heart failure medications, reviewing outside labs independently and summarizing them above, reviewed the office note from Dr. Santiago Bumpers dating 06/29/2021, plan of care discussed with both the patient and her daughter at today's office visit.  Refills the new medications sent over to her pharmacy and labs ordered.  Discussed disease process and management along with coordination of care.  Rex Kras, [place], [facility]  Pager: [phone] Office: [phone]

## 2021-07-20 NOTE — Telephone Encounter (Signed)
Daughter aware she does not need to stop Eliquis

## 2021-07-20 NOTE — Telephone Encounter (Signed)
Patient's daughter called. She would like to know how long and if patient should be off of the eliquis before having the injection? Her call back number is [phone]

## 2021-07-23 ENCOUNTER — Ambulatory Visit (INDEPENDENT_AMBULATORY_CARE_PROVIDER_SITE_OTHER): Payer: Medicare HMO | Admitting: Orthopaedic Surgery

## 2021-07-23 ENCOUNTER — Encounter: Payer: Self-pay | Admitting: Orthopaedic Surgery

## 2021-07-23 ENCOUNTER — Other Ambulatory Visit: Payer: Self-pay

## 2021-07-23 VITALS — BP 147/81

## 2021-07-23 DIAGNOSIS — M1711 Unilateral primary osteoarthritis, right knee: Secondary | ICD-10-CM | POA: Diagnosis not present

## 2021-07-23 DIAGNOSIS — M1712 Unilateral primary osteoarthritis, left knee: Secondary | ICD-10-CM | POA: Diagnosis not present

## 2021-07-23 MED ORDER — LIDOCAINE HCL 1 % IJ SOLN
3.0000 mL | INTRAMUSCULAR | Status: AC | PRN
  Administered 2021-07-23: 3 mL

## 2021-07-23 MED ORDER — HYLAN G-F 20 48 MG/6ML IX SOSY
48.0000 mg | PREFILLED_SYRINGE | INTRA_ARTICULAR | Status: AC | PRN
  Administered 2021-07-23: 48 mg via INTRA_ARTICULAR

## 2021-07-23 NOTE — Progress Notes (Signed)
   Procedure Note  Patient: Kristin [name]             Date of Birth: 1941-05-22           MRN: [id number]             Visit Date: 07/23/2021 HPI: Kristin [name] is a pleasant 80 year old female comes in today for Synvisc 1 injections both knees.  She states her right knee pain is worse than the left knee.  Ranks her right knee pain to be 10 out of 10 pain.  She is using Voltaren gel.  No recent injuries or falls.  She has had hyaluronic acid injections in the past which is giving her some relief.  She is unable to take NSAIDs due to that she is on Eliquis.  She is not a surgical candidate due to cardiac issues.  Physical exam: Bilateral knees no abnormal warmth erythema.  Right knee with slight effusion.  Patellofemoral crepitus with range of motion of both knees.   Procedures: Visit Diagnoses:  1. Unilateral primary osteoarthritis, right knee   2. Unilateral primary osteoarthritis, left knee     Large Joint Inj: bilateral knee on 07/23/2021 4:16 PM Indications: pain Details: 22 G 1.5 in needle, superolateral approach  Arthrogram: No  Medications (Right): 3 mL lidocaine 1 %; 48 mg Hylan 48 MG/6ML Aspirate (Right): 10 mL yellow and blood-tinged Medications (Left): 3 mL lidocaine 1 %; 48 mg Hylan 48 MG/6ML Outcome: tolerated well, no immediate complications Procedure, treatment alternatives, risks and benefits explained, specific risks discussed. Consent was given by the patient. Immediately prior to procedure a time out was called to verify the correct patient, procedure, equipment, support staff and site/side marked as required. Patient was prepped and draped in the usual sterile fashion.     Plan: She understands to wait at least 6 months between supplemental injections.  Questions encouraged and answered.  Follow-up as needed

## 2021-07-30 DIAGNOSIS — I25118 Atherosclerotic heart disease of native coronary artery with other forms of angina pectoris: Secondary | ICD-10-CM | POA: Diagnosis not present

## 2021-07-30 DIAGNOSIS — I5032 Chronic diastolic (congestive) heart failure: Secondary | ICD-10-CM | POA: Diagnosis not present

## 2021-07-31 ENCOUNTER — Other Ambulatory Visit: Payer: Self-pay

## 2021-07-31 DIAGNOSIS — I2510 Atherosclerotic heart disease of native coronary artery without angina pectoris: Secondary | ICD-10-CM

## 2021-07-31 LAB — BASIC METABOLIC PANEL
BUN/Creatinine Ratio: 15 (ref 12–28)
BUN: 25 mg/dL (ref 8–27)
CO2: 22 mmol/L (ref 20–29)
Calcium: 9.5 mg/dL (ref 8.7–10.3)
Chloride: 107 mmol/L — ABNORMAL HIGH (ref 96–106)
Creatinine, Ser: 1.7 mg/dL — ABNORMAL HIGH (ref 0.57–1.00)
Glucose: 97 mg/dL (ref 65–99)
Potassium: 5 mmol/L (ref 3.5–5.2)
Sodium: 146 mmol/L — ABNORMAL HIGH (ref 134–144)
eGFR: 30 mL/min/{1.73_m2} — ABNORMAL LOW (ref >59)

## 2021-07-31 LAB — MAGNESIUM: Magnesium: 2.3 mg/dL (ref 1.6–2.3)

## 2021-07-31 LAB — PRO B NATRIURETIC PEPTIDE: NT-Pro BNP: 1934 pg/mL — ABNORMAL HIGH (ref 0–738)

## 2021-07-31 NOTE — Progress Notes (Signed)
Spoke to patient's daughter she already went and picked up rx. They are aware to get labs prior to ov. Labs have been placed and released

## 2021-08-02 DIAGNOSIS — E119 Type 2 diabetes mellitus without complications: Secondary | ICD-10-CM | POA: Diagnosis not present

## 2021-08-02 DIAGNOSIS — I480 Paroxysmal atrial fibrillation: Secondary | ICD-10-CM | POA: Diagnosis not present

## 2021-08-02 DIAGNOSIS — I25118 Atherosclerotic heart disease of native coronary artery with other forms of angina pectoris: Secondary | ICD-10-CM | POA: Diagnosis not present

## 2021-08-02 DIAGNOSIS — I5020 Unspecified systolic (congestive) heart failure: Secondary | ICD-10-CM | POA: Diagnosis not present

## 2021-08-02 DIAGNOSIS — I252 Old myocardial infarction: Secondary | ICD-10-CM | POA: Diagnosis not present

## 2021-08-02 DIAGNOSIS — G8929 Other chronic pain: Secondary | ICD-10-CM | POA: Diagnosis not present

## 2021-08-02 DIAGNOSIS — M1711 Unilateral primary osteoarthritis, right knee: Secondary | ICD-10-CM | POA: Diagnosis not present

## 2021-08-02 DIAGNOSIS — I110 Hypertensive heart disease with heart failure: Secondary | ICD-10-CM | POA: Diagnosis not present

## 2021-08-02 DIAGNOSIS — E782 Mixed hyperlipidemia: Secondary | ICD-10-CM | POA: Diagnosis not present

## 2021-08-02 DIAGNOSIS — I10 Essential (primary) hypertension: Secondary | ICD-10-CM | POA: Diagnosis not present

## 2021-08-03 DIAGNOSIS — M48061 Spinal stenosis, lumbar region without neurogenic claudication: Secondary | ICD-10-CM | POA: Diagnosis not present

## 2021-08-03 DIAGNOSIS — I5022 Chronic systolic (congestive) heart failure: Secondary | ICD-10-CM | POA: Diagnosis not present

## 2021-08-03 DIAGNOSIS — R5381 Other malaise: Secondary | ICD-10-CM | POA: Diagnosis not present

## 2021-08-27 ENCOUNTER — Other Ambulatory Visit: Payer: Self-pay | Admitting: Cardiology

## 2021-08-27 DIAGNOSIS — I5032 Chronic diastolic (congestive) heart failure: Secondary | ICD-10-CM

## 2021-08-31 DIAGNOSIS — H40013 Open angle with borderline findings, low risk, bilateral: Secondary | ICD-10-CM | POA: Diagnosis not present

## 2021-08-31 DIAGNOSIS — H1132 Conjunctival hemorrhage, left eye: Secondary | ICD-10-CM | POA: Diagnosis not present

## 2021-09-01 ENCOUNTER — Other Ambulatory Visit: Payer: Self-pay | Admitting: Cardiology

## 2021-09-01 DIAGNOSIS — I480 Paroxysmal atrial fibrillation: Secondary | ICD-10-CM

## 2021-09-01 DIAGNOSIS — Z7901 Long term (current) use of anticoagulants: Secondary | ICD-10-CM

## 2021-09-03 DIAGNOSIS — I5022 Chronic systolic (congestive) heart failure: Secondary | ICD-10-CM | POA: Diagnosis not present

## 2021-09-03 DIAGNOSIS — M48061 Spinal stenosis, lumbar region without neurogenic claudication: Secondary | ICD-10-CM | POA: Diagnosis not present

## 2021-09-03 DIAGNOSIS — R5381 Other malaise: Secondary | ICD-10-CM | POA: Diagnosis not present

## 2021-09-14 DIAGNOSIS — I5020 Unspecified systolic (congestive) heart failure: Secondary | ICD-10-CM | POA: Diagnosis not present

## 2021-09-14 DIAGNOSIS — N189 Chronic kidney disease, unspecified: Secondary | ICD-10-CM | POA: Diagnosis not present

## 2021-09-14 DIAGNOSIS — I129 Hypertensive chronic kidney disease with stage 1 through stage 4 chronic kidney disease, or unspecified chronic kidney disease: Secondary | ICD-10-CM | POA: Diagnosis not present

## 2021-09-14 DIAGNOSIS — E1122 Type 2 diabetes mellitus with diabetic chronic kidney disease: Secondary | ICD-10-CM | POA: Diagnosis not present

## 2021-09-14 DIAGNOSIS — E559 Vitamin D deficiency, unspecified: Secondary | ICD-10-CM | POA: Diagnosis not present

## 2021-09-14 DIAGNOSIS — D631 Anemia in chronic kidney disease: Secondary | ICD-10-CM | POA: Diagnosis not present

## 2021-09-14 DIAGNOSIS — N184 Chronic kidney disease, stage 4 (severe): Secondary | ICD-10-CM | POA: Diagnosis not present

## 2021-09-27 ENCOUNTER — Other Ambulatory Visit: Payer: Self-pay | Admitting: Cardiology

## 2021-09-27 DIAGNOSIS — I5032 Chronic diastolic (congestive) heart failure: Secondary | ICD-10-CM

## 2021-09-28 ENCOUNTER — Other Ambulatory Visit: Payer: Self-pay | Admitting: Cardiology

## 2021-09-28 DIAGNOSIS — Z7901 Long term (current) use of anticoagulants: Secondary | ICD-10-CM

## 2021-09-28 DIAGNOSIS — I480 Paroxysmal atrial fibrillation: Secondary | ICD-10-CM

## 2021-10-03 DIAGNOSIS — R5381 Other malaise: Secondary | ICD-10-CM | POA: Diagnosis not present

## 2021-10-03 DIAGNOSIS — M48061 Spinal stenosis, lumbar region without neurogenic claudication: Secondary | ICD-10-CM | POA: Diagnosis not present

## 2021-10-03 DIAGNOSIS — I5022 Chronic systolic (congestive) heart failure: Secondary | ICD-10-CM | POA: Diagnosis not present

## 2021-10-11 IMAGING — DX DG CHEST 1V PORT
1 series · 1 of 1 positions shown · non-contrast
Comparison: None.

CLINICAL DATA: Fever and body aches for 24 hours

EXAM:
PORTABLE CHEST 1 VIEW

[chest ap]
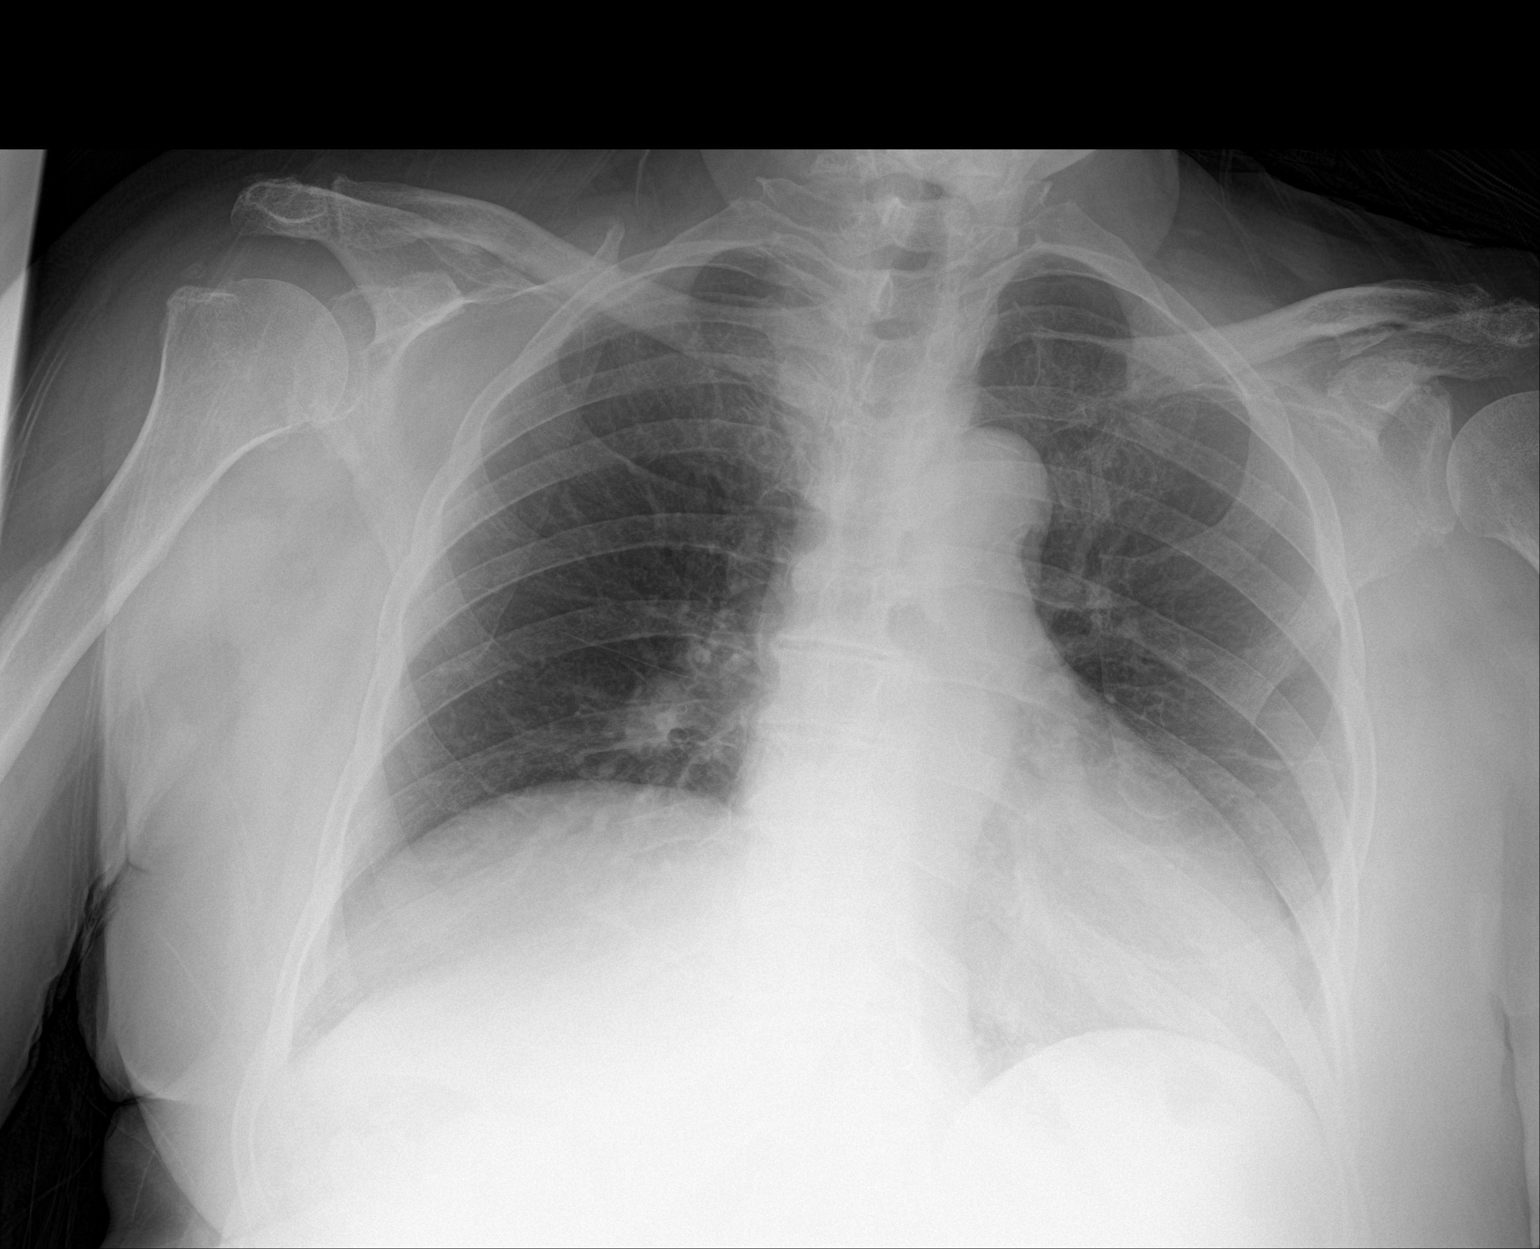

[1 of 1 positions shown; findings below may reference images not displayed]

FINDINGS: Shallow inspiration. Heart size and pulmonary vascularity are
normal. Lungs are clear. No pleural effusions. No pneumothorax.
Mediastinal contours appear intact. Degenerative changes in the
spine and shoulders. Calcification of the aorta.
IMPRESSION: No active disease.

## 2021-10-12 ENCOUNTER — Other Ambulatory Visit: Payer: Self-pay | Admitting: Cardiology

## 2021-10-12 DIAGNOSIS — I2510 Atherosclerotic heart disease of native coronary artery without angina pectoris: Secondary | ICD-10-CM

## 2021-10-18 ENCOUNTER — Other Ambulatory Visit: Payer: Self-pay | Admitting: Cardiology

## 2021-10-18 DIAGNOSIS — I2510 Atherosclerotic heart disease of native coronary artery without angina pectoris: Secondary | ICD-10-CM

## 2021-10-19 DIAGNOSIS — Z6834 Body mass index (BMI) 34.0-34.9, adult: Secondary | ICD-10-CM | POA: Diagnosis not present

## 2021-10-19 DIAGNOSIS — I10 Essential (primary) hypertension: Secondary | ICD-10-CM | POA: Diagnosis not present

## 2021-10-19 DIAGNOSIS — G8929 Other chronic pain: Secondary | ICD-10-CM | POA: Diagnosis not present

## 2021-10-19 DIAGNOSIS — M542 Cervicalgia: Secondary | ICD-10-CM | POA: Diagnosis not present

## 2021-10-19 DIAGNOSIS — M47812 Spondylosis without myelopathy or radiculopathy, cervical region: Secondary | ICD-10-CM | POA: Diagnosis not present

## 2021-10-22 DIAGNOSIS — I2510 Atherosclerotic heart disease of native coronary artery without angina pectoris: Secondary | ICD-10-CM | POA: Diagnosis not present

## 2021-10-22 DIAGNOSIS — R0609 Other forms of dyspnea: Secondary | ICD-10-CM | POA: Diagnosis not present

## 2021-10-23 LAB — PRO B NATRIURETIC PEPTIDE: NT-Pro BNP: 1719 pg/mL — ABNORMAL HIGH (ref 0–738)

## 2021-10-23 LAB — BASIC METABOLIC PANEL
BUN/Creatinine Ratio: 15 (ref 12–28)
BUN: 27 mg/dL (ref 8–27)
CO2: 26 mmol/L (ref 20–29)
Calcium: 9.7 mg/dL (ref 8.7–10.3)
Chloride: 105 mmol/L (ref 96–106)
Creatinine, Ser: 1.79 mg/dL — ABNORMAL HIGH (ref 0.57–1.00)
Glucose: 87 mg/dL (ref 70–99)
Potassium: 4.3 mmol/L (ref 3.5–5.2)
Sodium: 144 mmol/L (ref 134–144)
eGFR: 28 mL/min/{1.73_m2} — ABNORMAL LOW (ref >59)

## 2021-10-23 LAB — MAGNESIUM: Magnesium: 2 mg/dL (ref 1.6–2.3)

## 2021-10-26 ENCOUNTER — Other Ambulatory Visit: Payer: Self-pay

## 2021-10-26 ENCOUNTER — Encounter: Payer: Self-pay | Admitting: Cardiology

## 2021-10-26 ENCOUNTER — Ambulatory Visit: Payer: Medicare HMO | Admitting: Cardiology

## 2021-10-26 VITALS — BP 140/80 | HR 82 | Resp 16 | Ht 64.0 in | Wt 202.0 lb

## 2021-10-26 DIAGNOSIS — Z8673 Personal history of transient ischemic attack (TIA), and cerebral infarction without residual deficits: Secondary | ICD-10-CM

## 2021-10-26 DIAGNOSIS — I480 Paroxysmal atrial fibrillation: Secondary | ICD-10-CM

## 2021-10-26 DIAGNOSIS — I5032 Chronic diastolic (congestive) heart failure: Secondary | ICD-10-CM | POA: Diagnosis not present

## 2021-10-26 DIAGNOSIS — I252 Old myocardial infarction: Secondary | ICD-10-CM | POA: Diagnosis not present

## 2021-10-26 DIAGNOSIS — Z7901 Long term (current) use of anticoagulants: Secondary | ICD-10-CM | POA: Diagnosis not present

## 2021-10-26 DIAGNOSIS — I2510 Atherosclerotic heart disease of native coronary artery without angina pectoris: Secondary | ICD-10-CM | POA: Diagnosis not present

## 2021-10-26 DIAGNOSIS — N184 Chronic kidney disease, stage 4 (severe): Secondary | ICD-10-CM

## 2021-10-26 DIAGNOSIS — E1122 Type 2 diabetes mellitus with diabetic chronic kidney disease: Secondary | ICD-10-CM | POA: Diagnosis not present

## 2021-10-26 DIAGNOSIS — E782 Mixed hyperlipidemia: Secondary | ICD-10-CM

## 2021-10-26 DIAGNOSIS — Z955 Presence of coronary angioplasty implant and graft: Secondary | ICD-10-CM | POA: Diagnosis not present

## 2021-10-26 MED ORDER — APIXABAN 2.5 MG PO TABS: 2.5000 mg | ORAL_TABLET | Freq: Two times a day (BID) | ORAL | 0 refills | Status: DC

## 2021-10-26 MED ORDER — NITROGLYCERIN 0.4 MG SL SUBL: 0.4000 mg | SUBLINGUAL_TABLET | SUBLINGUAL | 0 refills | Status: DC | PRN

## 2021-10-26 MED ORDER — DAPAGLIFLOZIN PROPANEDIOL 10 MG PO TABS: 10.0000 mg | ORAL_TABLET | Freq: Every day | ORAL | 0 refills | Status: DC

## 2021-10-26 NOTE — Progress Notes (Signed)
Kristin [name] Date of Birth: 02-17-1941 MRN: [id number] Primary Care Provider:Hagler, Apolonio Schneiders, MD Primary Cardiologist: Rex Kras, DO, [facility] (established care 08/22/2020) Primary nephrologist: Dr. Santiago Bumpers  Date: 10/26/21 Last Office Visit: 07/20/2021  Chief Complaint  Patient presents with   Coronary artery calcification   heart failure management   Follow-up    HPI  Kristin [name] is a 80 y.o.  female who presents to the office with a chief complaint of " 19-monthfollow-up visit for heart failure and CAD management." Patient's past medical history and cardiovascular risk factors include: Paroxysmal atrial fibrillation, NSTEMI (08/2020), multivessel CAD, recovered ischemic cardiomyopathy, chronic HFpEF/stage C/NYHA class II, hx of cerebellar stroke, lumbar stenosis status post laminectomy, hypertension, CKD stage IV, diabetes miellitus type 2, postmenopausal female, advanced age.   Patient presents to the office accompanied by her daughter HRomie [name]   Patient establish care back in September 2021 while she was hospitalized for bilateral lower extremity weakness and back pain.  She had undergone L2-L4 laminectomy and subsequently was diagnosed with non-STEMI with a hs troponin greater than 27,000.  She also developed new onset of atrial fibrillation during that hospitalization and due to not being a anticoagulant candidate during the hospitalization due to recent neurosurgery she also had a stroke involving the cerebellar distribution.  Subsequently was managed medically until recently in June 2022 went to ED for chest pain suggestive of angina pectoris.  She underwent left heart catheterization and was found to have LAD/RCA disease and after discussing the risks versus benefit of high risk PCI versus CABG the shared decision was to proceed with coronary intervention.  She underwent PCI to the proximal/mid LAD successfully prior to discharge.  Patient has been followed up closely  and her antianginal therapy has been uptitrated to the point she is now asymptomatic with regards to chest pain for the last 3 months.  She has not required the use of sublingual nitroglycerin tablets.  She is tolerated the initiation of Farxiga since last office visit.  Most recent labs independently reviewed with her which notes stable renal function and improvement in NT proBNP.  She denies chest pain at rest or with effort related activities.  She still has dyspnea on exertion with over exertional activities which is chronic and stable.  Home SBP range between 138-140 mmHg.  She is requesting refills on Farxiga, Eliquis, and sublingual nitroglycerin tablets.  She recently followed up with nephrology and appears to be stable from a renal standpoint.  She has a follow-up appointment in 6 months.  FUNCTIONAL STATUS: No structured exercise program or daily routine.   ALLERGIES: No Known Allergies  MEDICATION LIST PRIOR TO VISIT: Current Outpatient Medications on File Prior to Visit  Medication Sig Dispense Refill   benzonatate (TESSALON) 100 MG capsule Take 100 mg by mouth 3 (three) times daily as needed.     carvedilol (COREG) 3.125 MG tablet TAKE 1 TABLET BY MOUTH TWICE DAILY WITH A MEAL 180 tablet 0   cholecalciferol (VITAMIN D) 25 MCG (1000 UNIT) tablet Take 3,000 Units by mouth daily.     colchicine 0.6 MG tablet Take 0.5 tablets (0.3 mg total) by mouth daily. 15 tablet 0   diclofenac Sodium (VOLTAREN) 1 % GEL Apply 2 g topically 4 (four) times daily.     gabapentin (NEURONTIN) 100 MG capsule Take 200 mg by mouth at bedtime.     hydrALAZINE (APRESOLINE) 100 MG tablet Take 1 tablet (100 mg total) by mouth 3 (three) times daily. 270 tablet 0  isosorbide mononitrate (IMDUR) 120 MG 24 hr tablet Take 1 tablet by mouth once daily 90 tablet 0   pantoprazole (PROTONIX) 40 MG tablet Take 1 tablet (40 mg total) by mouth daily. 30 tablet 0   rosuvastatin (CRESTOR) 20 MG tablet Take 1 tablet (20 mg  total) by mouth at bedtime. 90 tablet 1   No current facility-administered medications on file prior to visit.    PAST MEDICAL HISTORY: Past Medical History:  Diagnosis Date   Atrial fibrillation (HCC)    CHF (congestive heart failure) ([place])    Coronary artery disease    Diabetes mellitus without complication ([place])    Hypercholesteremia    Hypertension    Renal disorder    CKD Stage IV   Stroke ([place])     PAST SURGICAL HISTORY: Past Surgical History:  Procedure Laterality Date   CORONARY STENT INTERVENTION N/A 05/15/2021   Procedure: CORONARY STENT INTERVENTION;  Surgeon: Nigel Mormon, MD;  Location: [place] [facility];  Service: Cardiovascular;  Laterality: N/A;   LEFT HEART CATH AND CORONARY ANGIOGRAPHY N/A 05/11/2021   Procedure: LEFT HEART CATH AND CORONARY ANGIOGRAPHY;  Surgeon: Nigel Mormon, MD;  Location: [place] [facility];  Service: Cardiovascular;  Laterality: N/A;   LUMBAR LAMINECTOMY/DECOMPRESSION MICRODISCECTOMY N/A 08/19/2020   Procedure: LUMBAR LAMINECTOMY/DECOMPRESSION  Lumbar two-three, Lumbar three-four;  Surgeon: Dawley, Theodoro Doing, DO;  Location: [place];  Service: Neurosurgery;  Laterality: N/A;   RIGHT/LEFT HEART CATH AND CORONARY ANGIOGRAPHY N/A 08/23/2020   Procedure: RIGHT/LEFT HEART CATH AND CORONARY ANGIOGRAPHY;  Surgeon: [name], MD;  Location: [place] [facility];  Service: Cardiovascular;  Laterality: N/A;    FAMILY HISTORY: The patient's family history includes Diabetes in her father; Heart attack in her father; Heart disease in her mother; Hypertension in her sister, sister, sister, sister, sister, sister, sister, sister, sister, sister, sister, and sister.   SOCIAL HISTORY:  The patient  reports that she has never smoked. She has never used smokeless tobacco. She reports that she does not drink alcohol and does not use drugs.  Review of Systems  Constitutional: Negative for chills and fever.  HENT:  Negative for hoarse voice and  nosebleeds.        Difficultly hearing   Eyes:  Negative for blurred vision, discharge, double vision and pain.  Cardiovascular:  Positive for dyspnea on exertion (with overexertion). Negative for chest pain, claudication, leg swelling, near-syncope, orthopnea, palpitations, paroxysmal nocturnal dyspnea and syncope.  Respiratory:  Negative for hemoptysis and shortness of breath.   Musculoskeletal:  Positive for back pain and joint pain. Negative for muscle cramps and myalgias.       Unsteady with gait, but no falls.   Gastrointestinal:  Negative for abdominal pain, constipation, diarrhea, hematemesis, hematochezia, melena, nausea and vomiting.  Neurological:  Positive for paresthesias (hands and legs). Negative for dizziness and light-headedness.   PHYSICAL EXAM: Vitals with BMI 10/26/2021 07/23/2021 07/20/2021  Height 5' 4"  - -  Weight 202 lbs - -  BMI 28.78 - -  Systolic 676 720 947  Diastolic 80 81 90  Pulse 82 - 79   CONSTITUTIONAL: Age-appropriate female, hemodynamically stable, no acute distress.  SKIN: Skin is warm and dry. No rash noted. No cyanosis. No pallor. No jaundice HEAD: Normocephalic and atraumatic.  EYES: No scleral icterus MOUTH/THROAT: Moist oral membranes.  NECK: No JVD present. No thyromegaly noted. No carotid bruits  LYMPHATIC: No visible cervical adenopathy.  CHEST Normal respiratory effort. No intercostal retractions  LUNGS:  Clear to auscultation bilaterally.  No stridor. No wheezes. No rales.  CARDIOVASCULAR: Regular rate and rhythm, positive V8-L3, soft holosystolic murmur heard at the apex, no rubs or gallops appreciated ABDOMINAL: Obese, soft, nontender, nondistended, positive bowel sounds in all 4 quadrants, no apparent ascites.  EXTREMITIES: No peripheral edema, warm to touch, 2+ DP and PT pulses. HEMATOLOGIC: No significant bruising NEUROLOGIC: Oriented to person, place, and time. Nonfocal. Normal muscle tone.  PSYCHIATRIC: Normal mood and affect.  Normal behavior. Cooperative  CARDIAC DATABASE: EKG: 05/25/2021: Normal sinus rhythm, 69 bpm, left atrial enlargement, old anteroseptal infarct, without underlying ischemia or injury pattern.   Echocardiogram: 12/15/2020 1. The distal inferoseptum and apex appear mildly hypokinetic, but WMA is much improved compared with prior study. LVEF has improved considerably. Left ventricular ejection fraction, by estimation, is 55 to 60%. Left ventricular ejection fraction by 3D volume is 56%. The left ventricle has normal function. The left ventricle demonstrates regional wall motion abnormalities (see scoring diagram/findings for description). Left ventricular diastolic parameters are consistent with Grade I diastolic dysfunction (impaired relaxation). 2. Right ventricular systolic function is normal. The right ventricular size is normal. There is normal pulmonary artery systolic pressure. The estimated right ventricular systolic pressure is 81.0 mmHg. 3. The mitral valve is grossly normal. Trivial mitral valve regurgitation. No evidence of mitral stenosis. 4. The aortic valve is tricuspid. There is mild calcification of the aortic valve. There is mild thickening of the aortic valve. Aortic valve regurgitation is not visualized. Mild aortic valve sclerosis is present, with no evidence of aortic valve stenosis. 5. The inferior vena cava is normal in size with greater than 50% respiratory variability, suggesting right atrial pressure of 3 mmHg.  07/06/2021:  Left ventricle cavity is normal in size. Mild concentric hypertrophy of the left ventricle. Mid to distal anteroseptal/anterior mild hypokinesis.  Mild global hypokinesis. LVEF 40-45%. Doppler evidence of grade II (pseudonormal) diastolic dysfunction, elevated LAP.  Left atrial cavity is moderately dilated.  Moderate (Grade II) mitral regurgitation. Mildly restricted mitral valve leaflets.  Moderate tricuspid regurgitation. Moderate pulmonary hypertension.   Estimated pulmonary artery systolic pressure 59 mmHg.  Mild pulmonic regurgitation.  Previous study on 05/11/2021 noted akinetic mid-apical anterior wall, anteroseptal wall, and apex. LVEF 45-50%, no pulmonary hypertension.   Stress Testing:  No results found for this or any previous visit from the past 1095 days.  Heart Catheterization: 05/11/2021 LM: Normal LAD: Prox 60% stenosis        Mid 95% stenosis just prox to large bifurcating diag        Diag ostium does not seem to be involved.        (Prior cath in 08/2020 noted prox 80% stenosis, followed by long mid to apical LAD occlusion, which seems to have recanalized) LCx: No significant disease RCA: Ostial 90% stenosis         (Significant dampening of pressure noted)         RPL 60% stenosis RCA and LCx system seems largely unchanged compared to 08/2020   Two vessel CAD (Prox-mid LAD and ostial RCA) Elevated LVEDP 29 mmHg   Given acute HFrEF, recommend staged revascularization. Options include CABG (Grafts to LAD, Diag, and RCA) vs PCI to LAD and RCA. Recommend medical management of heart failure over the weekend, followed by revascularization.  05/15/2021: Prox-mid LAD 60-99% stenosis   Successful percutaneous coronary intervention mid-prox LAD PTCA and stent placement 3.5 X 32 mm Synergy drug-eluting stent Proximal post dilatation using 4.0X15 mm balloon at 22  atm   99-->0% residual stenosis TIMI flow I-->III   Patient is also on eliquis for Afib Recommend Aspirin, plavix, and eliquis for 1 month After that, discontinue Aspirin and continue plavix and eliquis for at least 1 year After 1 year, could consider stopping plavix   Recommend symptoms guided management for ostial RCA stenosis Continue GDMT for HFrEF  LABORATORY DATA: CBC Latest Ref Rng & Units 05/16/2021 05/15/2021 05/14/2021  WBC 4.0 - 10.5 K/uL 6.3 6.1 5.5  Hemoglobin 12.0 - 15.0 g/dL 8.7(L) 9.4(L) 9.8(L)  Hematocrit 36.0 - 46.0 % 27.1(L) 29.6(L) 30.7(L)   Platelets 150 - 400 K/uL 158 171 158    CMP Latest Ref Rng & Units 10/22/2021 07/30/2021 05/23/2021  Glucose 70 - 99 mg/dL 87 97 75  BUN 8 - 27 mg/dL 27 25 19   Creatinine 0.57 - 1.00 mg/dL 1.79(H) 1.70(H) 1.63(H)  Sodium 134 - 144 mmol/L 144 146(H) 145(H)  Potassium 3.5 - 5.2 mmol/L 4.3 5.0 4.6  Chloride 96 - 106 mmol/L 105 107(H) 106  CO2 20 - 29 mmol/L 26 22 22   Calcium 8.7 - 10.3 mg/dL 9.7 9.5 9.2  Total Protein 6.5 - 8.1 g/dL - - -  Total Bilirubin 0.3 - 1.2 mg/dL - - -  Alkaline Phos 38 - 126 U/L - - -  AST 15 - 41 U/L - - -  ALT 0 - 44 U/L - - -    Lipid Panel     Component Value Date/Time   CHOL 130 08/29/2020 0250   TRIG 89 08/29/2020 0250   HDL 47 08/29/2020 0250   CHOLHDL 2.8 08/29/2020 0250   VLDL 18 08/29/2020 0250   LDLCALC 65 08/29/2020 0250    Lab Results  Component Value Date   HGBA1C 6.1 (H) 05/11/2021   HGBA1C 6.7 (H) 08/27/2020   No components found for: NTPROBNP Lab Results  Component Value Date   TSH 1.054 08/22/2020   TSH 1.190 03/13/2020    Cardiac Panel (last 3 results) No results for input(s): CKTOTAL, CKMB, TROPONINIHS, RELINDX in the last 72 hours.  External Labs: Collected: 03/16/2021 performed by her PCP Creatinine 2.02 mg/dL. eGFR: 24 mL/min per 1.73 m Hemoglobin 12 g/dL, hematocrit 36.9% Sodium 145, potassium 3.6, chloride 101, bicarb 36, BUN 38 TSH 1.62  External Labs: Collected: 06/29/2021 Creatinine 1.50 mg/dL. eGFR: 35 mL/min per 1.73 m Sodium is 142, potassium 4.7, chloride 106, bicarb 29 Hemoglobin 11.6 g/dL, hematocrit 35.2%. NT proBNP 2604.  IMPRESSION:    ICD-10-CM   1. Chronic heart failure with preserved ejection fraction (HFpEF) (HCC)  I50.32 dapagliflozin propanediol (FARXIGA) 10 MG TABS tablet    2. Paroxysmal atrial fibrillation (HCC)  I48.0 apixaban (ELIQUIS) 2.5 MG TABS tablet    3. Long term (current) use of anticoagulants  Z79.01 apixaban (ELIQUIS) 2.5 MG TABS tablet    4. Atherosclerosis of native  coronary artery of native heart without angina pectoris  I25.10 nitroGLYCERIN (NITROSTAT) 0.4 MG SL tablet    5. History of coronary angioplasty with insertion of stent  Z95.5     6. Mixed hyperlipidemia  E78.2     7. Hx of stroke  Z86.73     8. Type 2 diabetes mellitus with stage 4 chronic kidney disease, without long-term current use of insulin (HCC)  E11.22    N18.4     9. Hx of non-ST elevation myocardial infarction (NSTEMI)  I25.2        RECOMMENDATIONS: Kristin [name] is a 80 y.o. female whose past medical history  and cardiovascular risk factors include: Paroxysmal atrial fibrillation, NSTEMI (08/2020), multivessel CAD, recovered ischemic cardiomyopathy, chronic HFpEF/stage C/NYHA class II, hx of cerebellar stroke, lumbar stenosis status post laminectomy, hypertension, CKD stage IV, diabetes miellitus type 2, postmenopausal female, advanced age.   Chronic heart failure with preserved EF, stage C, NYHA class II: Euvolemic on physical examination Weight relatively stable compared to prior office visit 3 months ago Independently reviewed labs dated 10/22/2021 : Renal function stable NT proBNP improving Refilled Farxiga Medications reconciled. Recommend daily weight check, strict I/O's Fluid restriction to <2L per day, Na restriction < 2g per day  Atherosclerosis of the native coronary arteries without angina pectoris status post PCI: Chest pain-free. Antianginal therapy reviewed. Sublingual nitroglycerin tablets refilled  Has not required sublingual nitroglycerin tablet since last office encounter She underwent PCI to the proximal/mid LAD on May 15, 2021. Recommended to continue Eliquis and Plavix for 1 year. Has not tolerated Toprol XL in the past -claims it causes chest pain.  Paroxysmal atrial fibrillation:  Currently normal sinus rhythm.  Rate control: Carvedilol. Rhythm control: N/A Thromboembolic prophylaxis: Eliquis.   She remains in normal sinus rhythm despite  discontinuation of amiodarone.  Long-term oral anticoagulation: CHA2DS2-VASc SCORE is 9 which correlates to 15.2 % risk of stroke per year (CHF, hypertension, age greater than 24, diabetes, stroke, history of non-STEMI, female). Reemphasized the risks, benefits, and alternatives to oral anticoagulation.  Refill Eliquis  Type 2 diabetes mellitus, non-insulin-dependent: Currently managed by primary care provider.  Mixed Hyperlipidemia:  Continue statin therapy.   Follow lipids. Currently managed by primary care provider. Patient denies myalgia or other side effects.  FINAL MEDICATION LIST END OF ENCOUNTER: Meds ordered this encounter  Medications   apixaban (ELIQUIS) 2.5 MG TABS tablet    Sig: Take 1 tablet (2.5 mg total) by mouth 2 (two) times daily.    Dispense:  180 tablet    Refill:  0   dapagliflozin propanediol (FARXIGA) 10 MG TABS tablet    Sig: Take 1 tablet (10 mg total) by mouth daily before breakfast.    Dispense:  90 tablet    Refill:  0   nitroGLYCERIN (NITROSTAT) 0.4 MG SL tablet    Sig: Place 1 tablet (0.4 mg total) under the tongue every 5 (five) minutes as needed for up to 30 doses for chest pain.    Dispense:  30 tablet    Refill:  0      Current Outpatient Medications:    benzonatate (TESSALON) 100 MG capsule, Take 100 mg by mouth 3 (three) times daily as needed., Disp: , Rfl:    carvedilol (COREG) 3.125 MG tablet, TAKE 1 TABLET BY MOUTH TWICE DAILY WITH A MEAL, Disp: 180 tablet, Rfl: 0   cholecalciferol (VITAMIN D) 25 MCG (1000 UNIT) tablet, Take 3,000 Units by mouth daily., Disp: , Rfl:    colchicine 0.6 MG tablet, Take 0.5 tablets (0.3 mg total) by mouth daily., Disp: 15 tablet, Rfl: 0   diclofenac Sodium (VOLTAREN) 1 % GEL, Apply 2 g topically 4 (four) times daily., Disp: , Rfl:    gabapentin (NEURONTIN) 100 MG capsule, Take 200 mg by mouth at bedtime., Disp: , Rfl:    hydrALAZINE (APRESOLINE) 100 MG tablet, Take 1 tablet (100 mg total) by mouth 3 (three)  times daily., Disp: 270 tablet, Rfl: 0   isosorbide mononitrate (IMDUR) 120 MG 24 hr tablet, Take 1 tablet by mouth once daily, Disp: 90 tablet, Rfl: 0   pantoprazole (PROTONIX) 40 MG tablet, Take  1 tablet (40 mg total) by mouth daily., Disp: 30 tablet, Rfl: 0   rosuvastatin (CRESTOR) 20 MG tablet, Take 1 tablet (20 mg total) by mouth at bedtime., Disp: 90 tablet, Rfl: 1   apixaban (ELIQUIS) 2.5 MG TABS tablet, Take 1 tablet (2.5 mg total) by mouth 2 (two) times daily., Disp: 180 tablet, Rfl: 0   dapagliflozin propanediol (FARXIGA) 10 MG TABS tablet, Take 1 tablet (10 mg total) by mouth daily before breakfast., Disp: 90 tablet, Rfl: 0   nitroGLYCERIN (NITROSTAT) 0.4 MG SL tablet, Place 1 tablet (0.4 mg total) under the tongue every 5 (five) minutes as needed for up to 30 doses for chest pain., Disp: 30 tablet, Rfl: 0  No orders of the defined types were placed in this encounter.   --Continue cardiac medications as reconciled in final medication list. --Return in about 25 weeks (around 04/19/2022) for Follow up, CAD, A. fib, heart failure management.. Or sooner if needed. --Continue follow-up with your primary care physician regarding the management of your other chronic comorbid conditions.  Patient's questions and concerns were addressed to her satisfaction. She voices understanding of the instructions provided during this encounter.   This note was created using a voice recognition software as a result there may be grammatical errors inadvertently enclosed that do not reflect the nature of this encounter. Every attempt is made to correct such errors.  Total time spent at least 40 minutes discussing/reevaluating angina in the setting of complex CAD, up titration of heart failure medications, reviewing outside labs independently and summarizing them above, reviewed the office note from Dr. Santiago Bumpers dating 06/29/2021, plan of care discussed with both the patient and her daughter at today's  office visit.  Refills the new medications sent over to her pharmacy and labs ordered.  Discussed disease process and management along with coordination of care.  Rex Kras, [place], [facility]  Pager: [phone] Office: [phone]

## 2021-11-03 DIAGNOSIS — R5381 Other malaise: Secondary | ICD-10-CM | POA: Diagnosis not present

## 2021-11-03 DIAGNOSIS — M48061 Spinal stenosis, lumbar region without neurogenic claudication: Secondary | ICD-10-CM | POA: Diagnosis not present

## 2021-11-03 DIAGNOSIS — I5022 Chronic systolic (congestive) heart failure: Secondary | ICD-10-CM | POA: Diagnosis not present

## 2021-11-13 ENCOUNTER — Other Ambulatory Visit: Payer: Self-pay

## 2021-11-13 DIAGNOSIS — I5032 Chronic diastolic (congestive) heart failure: Secondary | ICD-10-CM

## 2021-11-16 DIAGNOSIS — I700 Atherosclerosis of aorta: Secondary | ICD-10-CM | POA: Diagnosis not present

## 2021-11-16 DIAGNOSIS — I480 Paroxysmal atrial fibrillation: Secondary | ICD-10-CM | POA: Diagnosis not present

## 2021-11-16 DIAGNOSIS — E559 Vitamin D deficiency, unspecified: Secondary | ICD-10-CM | POA: Diagnosis not present

## 2021-11-16 DIAGNOSIS — E782 Mixed hyperlipidemia: Secondary | ICD-10-CM | POA: Diagnosis not present

## 2021-11-16 DIAGNOSIS — Z23 Encounter for immunization: Secondary | ICD-10-CM | POA: Diagnosis not present

## 2021-11-16 DIAGNOSIS — I10 Essential (primary) hypertension: Secondary | ICD-10-CM | POA: Diagnosis not present

## 2021-11-16 DIAGNOSIS — N184 Chronic kidney disease, stage 4 (severe): Secondary | ICD-10-CM | POA: Diagnosis not present

## 2021-11-16 DIAGNOSIS — D6869 Other thrombophilia: Secondary | ICD-10-CM | POA: Diagnosis not present

## 2021-11-16 DIAGNOSIS — I110 Hypertensive heart disease with heart failure: Secondary | ICD-10-CM | POA: Diagnosis not present

## 2021-11-16 DIAGNOSIS — I25118 Atherosclerotic heart disease of native coronary artery with other forms of angina pectoris: Secondary | ICD-10-CM | POA: Diagnosis not present

## 2021-11-16 DIAGNOSIS — G8929 Other chronic pain: Secondary | ICD-10-CM | POA: Diagnosis not present

## 2021-11-16 DIAGNOSIS — E1122 Type 2 diabetes mellitus with diabetic chronic kidney disease: Secondary | ICD-10-CM | POA: Diagnosis not present

## 2021-12-03 DIAGNOSIS — I5022 Chronic systolic (congestive) heart failure: Secondary | ICD-10-CM | POA: Diagnosis not present

## 2021-12-03 DIAGNOSIS — R5381 Other malaise: Secondary | ICD-10-CM | POA: Diagnosis not present

## 2021-12-03 DIAGNOSIS — M48061 Spinal stenosis, lumbar region without neurogenic claudication: Secondary | ICD-10-CM | POA: Diagnosis not present

## 2021-12-11 ENCOUNTER — Other Ambulatory Visit: Payer: Self-pay | Admitting: Cardiology

## 2021-12-11 DIAGNOSIS — N184 Chronic kidney disease, stage 4 (severe): Secondary | ICD-10-CM | POA: Diagnosis not present

## 2021-12-11 DIAGNOSIS — I2510 Atherosclerotic heart disease of native coronary artery without angina pectoris: Secondary | ICD-10-CM

## 2021-12-21 DIAGNOSIS — G894 Chronic pain syndrome: Secondary | ICD-10-CM | POA: Diagnosis not present

## 2021-12-21 DIAGNOSIS — I639 Cerebral infarction, unspecified: Secondary | ICD-10-CM | POA: Diagnosis not present

## 2021-12-21 DIAGNOSIS — E1122 Type 2 diabetes mellitus with diabetic chronic kidney disease: Secondary | ICD-10-CM | POA: Diagnosis not present

## 2021-12-21 DIAGNOSIS — Z7901 Long term (current) use of anticoagulants: Secondary | ICD-10-CM | POA: Diagnosis not present

## 2021-12-21 DIAGNOSIS — I129 Hypertensive chronic kidney disease with stage 1 through stage 4 chronic kidney disease, or unspecified chronic kidney disease: Secondary | ICD-10-CM | POA: Diagnosis not present

## 2021-12-21 DIAGNOSIS — N184 Chronic kidney disease, stage 4 (severe): Secondary | ICD-10-CM | POA: Diagnosis not present

## 2021-12-21 DIAGNOSIS — I5020 Unspecified systolic (congestive) heart failure: Secondary | ICD-10-CM | POA: Diagnosis not present

## 2022-01-10 ENCOUNTER — Other Ambulatory Visit: Payer: Self-pay | Admitting: Cardiology

## 2022-01-10 DIAGNOSIS — I2510 Atherosclerotic heart disease of native coronary artery without angina pectoris: Secondary | ICD-10-CM

## 2022-01-18 ENCOUNTER — Other Ambulatory Visit: Payer: Self-pay | Admitting: Cardiology

## 2022-01-18 DIAGNOSIS — I2510 Atherosclerotic heart disease of native coronary artery without angina pectoris: Secondary | ICD-10-CM

## 2022-01-28 ENCOUNTER — Other Ambulatory Visit: Payer: Self-pay | Admitting: Cardiology

## 2022-01-28 DIAGNOSIS — I480 Paroxysmal atrial fibrillation: Secondary | ICD-10-CM

## 2022-01-28 DIAGNOSIS — Z7901 Long term (current) use of anticoagulants: Secondary | ICD-10-CM

## 2022-02-28 ENCOUNTER — Other Ambulatory Visit: Payer: Self-pay | Admitting: Cardiology

## 2022-02-28 DIAGNOSIS — I5032 Chronic diastolic (congestive) heart failure: Secondary | ICD-10-CM

## 2022-04-12 ENCOUNTER — Other Ambulatory Visit: Payer: Self-pay | Admitting: Cardiology

## 2022-04-12 DIAGNOSIS — I2510 Atherosclerotic heart disease of native coronary artery without angina pectoris: Secondary | ICD-10-CM

## 2022-04-18 ENCOUNTER — Other Ambulatory Visit: Payer: Self-pay | Admitting: Cardiology

## 2022-04-18 DIAGNOSIS — I2510 Atherosclerotic heart disease of native coronary artery without angina pectoris: Secondary | ICD-10-CM

## 2022-04-19 ENCOUNTER — Ambulatory Visit: Payer: Medicare HMO | Admitting: Cardiology

## 2022-04-19 DIAGNOSIS — I5032 Chronic diastolic (congestive) heart failure: Secondary | ICD-10-CM | POA: Diagnosis not present

## 2022-04-20 LAB — BASIC METABOLIC PANEL
BUN/Creatinine Ratio: 14 (ref 12–28)
BUN: 22 mg/dL (ref 8–27)
CO2: 28 mmol/L (ref 20–29)
Calcium: 9.5 mg/dL (ref 8.7–10.3)
Chloride: 101 mmol/L (ref 96–106)
Creatinine, Ser: 1.56 mg/dL — ABNORMAL HIGH (ref 0.57–1.00)
Glucose: 85 mg/dL (ref 70–99)
Potassium: 3.9 mmol/L (ref 3.5–5.2)
Sodium: 144 mmol/L (ref 134–144)
eGFR: 33 mL/min/{1.73_m2} — ABNORMAL LOW (ref >59)

## 2022-04-20 LAB — MAGNESIUM: Magnesium: 2.2 mg/dL (ref 1.6–2.3)

## 2022-04-20 LAB — PRO B NATRIURETIC PEPTIDE: NT-Pro BNP: 696 pg/mL (ref 0–738)

## 2022-04-22 NOTE — Progress Notes (Signed)
Spoke with patient daughter, patient will be here at scheduled appointment.

## 2022-04-24 ENCOUNTER — Ambulatory Visit: Payer: Medicare HMO | Admitting: Cardiology

## 2022-04-25 ENCOUNTER — Other Ambulatory Visit: Payer: Self-pay | Admitting: Cardiology

## 2022-04-25 DIAGNOSIS — I480 Paroxysmal atrial fibrillation: Secondary | ICD-10-CM

## 2022-04-25 DIAGNOSIS — Z7901 Long term (current) use of anticoagulants: Secondary | ICD-10-CM

## 2022-04-26 ENCOUNTER — Ambulatory Visit: Payer: Medicare HMO | Admitting: Cardiology

## 2022-05-03 ENCOUNTER — Ambulatory Visit: Payer: Medicare HMO | Admitting: Cardiology

## 2022-05-03 ENCOUNTER — Encounter: Payer: Self-pay | Admitting: Cardiology

## 2022-05-03 VITALS — BP 150/79 | HR 81 | Temp 97.6°F | Resp 16 | Ht 64.0 in | Wt 193.2 lb

## 2022-05-03 DIAGNOSIS — I2510 Atherosclerotic heart disease of native coronary artery without angina pectoris: Secondary | ICD-10-CM | POA: Diagnosis not present

## 2022-05-03 DIAGNOSIS — Z7901 Long term (current) use of anticoagulants: Secondary | ICD-10-CM | POA: Diagnosis not present

## 2022-05-03 DIAGNOSIS — N1832 Chronic kidney disease, stage 3b: Secondary | ICD-10-CM

## 2022-05-03 DIAGNOSIS — Z8673 Personal history of transient ischemic attack (TIA), and cerebral infarction without residual deficits: Secondary | ICD-10-CM

## 2022-05-03 DIAGNOSIS — I480 Paroxysmal atrial fibrillation: Secondary | ICD-10-CM

## 2022-05-03 DIAGNOSIS — I5042 Chronic combined systolic (congestive) and diastolic (congestive) heart failure: Secondary | ICD-10-CM

## 2022-05-03 DIAGNOSIS — Z955 Presence of coronary angioplasty implant and graft: Secondary | ICD-10-CM

## 2022-05-03 DIAGNOSIS — E782 Mixed hyperlipidemia: Secondary | ICD-10-CM

## 2022-05-03 DIAGNOSIS — I252 Old myocardial infarction: Secondary | ICD-10-CM

## 2022-05-03 MED ORDER — NITROGLYCERIN 0.4 MG SL SUBL: 0.4000 mg | SUBLINGUAL_TABLET | SUBLINGUAL | 0 refills | Status: DC | PRN

## 2022-05-03 MED ORDER — ENTRESTO 24-26 MG PO TABS: 1.0000 | ORAL_TABLET | Freq: Two times a day (BID) | ORAL | 0 refills | Status: DC

## 2022-05-03 NOTE — Progress Notes (Signed)
Kristin [name] Date of Birth: Nov 29, 1941 MRN: [id number] Primary Care Provider:Hagler, Apolonio Schneiders, MD Primary Cardiologist: Rex Kras, DO, [facility] (established care 08/22/2020) Primary nephrologist: Dr. Santiago Bumpers  Date: 05/03/22 Last Office Visit: 10/26/2021  Chief Complaint  Patient presents with   Coronary Artery Disease   Atrial Fibrillation   heart failure management   Follow-up    HPI  Kristin [name] is a 81 y.o.  female whose past medical history and cardiovascular risk factors include: Paroxysmal atrial fibrillation, NSTEMI (08/2020), multivessel CAD, recovered ischemic cardiomyopathy, chronic HFpEF/stage C/NYHA class II, hx of cerebellar stroke, lumbar stenosis status post laminectomy, hypertension, CKD stage III/IV, diabetes miellitus type 2, postmenopausal female, advanced age.   Patient presents to the office accompanied by her daughter Kristin [name].   Patient establish care back in September 2021 while she was hospitalized for bilateral lower extremity weakness and back pain.  She had undergone L2-L4 laminectomy and subsequently was diagnosed with non-STEMI with a hs troponin greater than 27,000.  She also developed new onset of atrial fibrillation during that hospitalization and due to not being a anticoagulant candidate during the hospitalization due to recent neurosurgery she also had a stroke involving the cerebellar distribution.  Subsequently was managed medically until recently in June 2022 went to ED for chest pain suggestive of angina pectoris.  She underwent left heart catheterization and was found to have LAD/RCA disease and after discussing the risks versus benefit of high risk PCI versus CABG the shared decision was to proceed with coronary intervention.  She underwent PCI to the proximal/mid LAD successfully prior to discharge.  Since then her medical therapy has been uptitrated in a stepwise fashion and hemodynamics and labs remain relatively stable.  Since last  office visit patient has not had any anginal discomfort or heart failure symptoms.  She is tolerating oral anticoagulation well without any side effects or intolerances.  Outside labs from Apr 19, 2022 independently reviewed.  Renal function relatively at baseline NT proBNP improving.  Patient has not been on Entresto/ARB given her renal function.   FUNCTIONAL STATUS: No structured exercise program or daily routine.   ALLERGIES: No Known Allergies  MEDICATION LIST PRIOR TO VISIT: Current Outpatient Medications on File Prior to Visit  Medication Sig Dispense Refill   benzonatate (TESSALON) 100 MG capsule Take 100 mg by mouth 3 (three) times daily as needed.     carvedilol (COREG) 3.125 MG tablet TAKE 1 TABLET BY MOUTH TWICE DAILY WITH A MEAL 180 tablet 0   cholecalciferol (VITAMIN D) 25 MCG (1000 UNIT) tablet Take 3,000 Units by mouth daily.     colchicine 0.6 MG tablet Take 0.5 tablets (0.3 mg total) by mouth daily. 15 tablet 0   diclofenac Sodium (VOLTAREN) 1 % GEL Apply 2 g topically 4 (four) times daily.     ELIQUIS 2.5 MG TABS tablet Take 1 tablet by mouth twice daily 180 tablet 0   FARXIGA 10 MG TABS tablet TAKE 1 TABLET BY MOUTH ONCE DAILY BEFORE BREAKFAST 90 tablet 0   hydrALAZINE (APRESOLINE) 100 MG tablet TAKE 1 TABLET BY MOUTH THREE TIMES DAILY 180 tablet 0   isosorbide mononitrate (IMDUR) 120 MG 24 hr tablet Take 1 tablet by mouth once daily 90 tablet 0   nitroGLYCERIN (NITROSTAT) 0.4 MG SL tablet Place 1 tablet (0.4 mg total) under the tongue every 5 (five) minutes as needed for up to 30 doses for chest pain. 30 tablet 0   pantoprazole (PROTONIX) 40 MG tablet Take 1 tablet (40  mg total) by mouth daily. 30 tablet 0   rosuvastatin (CRESTOR) 20 MG tablet Take 1 tablet (20 mg total) by mouth at bedtime. 90 tablet 1   No current facility-administered medications on file prior to visit.    PAST MEDICAL HISTORY: Past Medical History:  Diagnosis Date   Atrial fibrillation (HCC)     CHF (congestive heart failure) ([place])    Coronary artery disease    Diabetes mellitus without complication ([place])    Hypercholesteremia    Hypertension    Renal disorder    CKD Stage IV   Stroke ([place])     PAST SURGICAL HISTORY: Past Surgical History:  Procedure Laterality Date   CORONARY STENT INTERVENTION N/A 05/15/2021   Procedure: CORONARY STENT INTERVENTION;  Surgeon: Nigel Mormon, MD;  Location: [place] [facility];  Service: Cardiovascular;  Laterality: N/A;   LEFT HEART CATH AND CORONARY ANGIOGRAPHY N/A 05/11/2021   Procedure: LEFT HEART CATH AND CORONARY ANGIOGRAPHY;  Surgeon: Nigel Mormon, MD;  Location: [place] [facility];  Service: Cardiovascular;  Laterality: N/A;   LUMBAR LAMINECTOMY/DECOMPRESSION MICRODISCECTOMY N/A 08/19/2020   Procedure: LUMBAR LAMINECTOMY/DECOMPRESSION  Lumbar two-three, Lumbar three-four;  Surgeon: Dawley, Theodoro Doing, DO;  Location: [place];  Service: Neurosurgery;  Laterality: N/A;   RIGHT/LEFT HEART CATH AND CORONARY ANGIOGRAPHY N/A 08/23/2020   Procedure: RIGHT/LEFT HEART CATH AND CORONARY ANGIOGRAPHY;  Surgeon: [name], MD;  Location: [place] [facility];  Service: Cardiovascular;  Laterality: N/A;    FAMILY HISTORY: The patient's family history includes Diabetes in her father; Heart attack in her father; Heart disease in her mother; Hypertension in her sister, sister, sister, sister, sister, sister, sister, sister, sister, sister, sister, and sister.   SOCIAL HISTORY:  The patient  reports that she has never smoked. She has never used smokeless tobacco. She reports that she does not drink alcohol and does not use drugs.  Review of Systems  Constitutional: Positive for weight loss. Negative for chills and fever.  HENT:  Negative for hoarse voice and nosebleeds.        Difficultly hearing   Eyes:  Negative for blurred vision, discharge, double vision and pain.  Cardiovascular:  Positive for dyspnea on exertion (with overexertion- chronic  and stable). Negative for chest pain, claudication, leg swelling, near-syncope, orthopnea, palpitations, paroxysmal nocturnal dyspnea and syncope.  Respiratory:  Negative for hemoptysis and shortness of breath.   Musculoskeletal:  Positive for back pain and joint pain. Negative for muscle cramps and myalgias.       Unsteady with gait, but no falls.   Gastrointestinal:  Negative for abdominal pain, constipation, diarrhea, hematemesis, hematochezia, melena, nausea and vomiting.  Neurological:  Positive for paresthesias (hands and legs). Negative for dizziness and light-headedness.   PHYSICAL EXAM:    05/03/2022   11:04 AM 05/03/2022   10:56 AM 10/26/2021    9:44 AM  Vitals with BMI  Height  5' 4"  5' 4"   Weight  193 lbs 3 oz 202 lbs  BMI  78.67 67.20  Systolic 947 096 283  Diastolic 79 92 80  Pulse 81 82 82   CONSTITUTIONAL: Age-appropriate female, hemodynamically stable, no acute distress.  SKIN: Skin is warm and dry. No rash noted. No cyanosis. No pallor. No jaundice HEAD: Normocephalic and atraumatic.  EYES: No scleral icterus MOUTH/THROAT: Moist oral membranes.  NECK: No JVD present. No thyromegaly noted. No carotid bruits  CHEST Normal respiratory effort. No intercostal retractions  LUNGS: Clear to auscultation bilaterally.  No stridor.  No wheezes. No rales.  CARDIOVASCULAR: Regular rate and rhythm, positive X5-A5, soft holosystolic murmur heard at the apex, no rubs or gallops appreciated ABDOMINAL: Obese, soft, nontender, nondistended, positive bowel sounds in all 4 quadrants, no apparent ascites.  EXTREMITIES: No peripheral edema, warm to touch, 2+ DP and PT pulses. HEMATOLOGIC: No significant bruising NEUROLOGIC: Oriented to person, place, and time. Nonfocal. Normal muscle tone.  PSYCHIATRIC: Normal mood and affect. Normal behavior. Cooperative  CARDIAC DATABASE: EKG: 05/25/2021: Normal sinus rhythm, 69 bpm, left atrial enlargement, old anteroseptal infarct, without underlying  ischemia or injury pattern.  05/03/2022: NSR, 83 bpm, old anteroseptal infarct, nonspecific T wave abnormality.    Echocardiogram: 12/15/2020 1. The distal inferoseptum and apex appear mildly hypokinetic, but WMA is much improved compared with prior study. LVEF has improved considerably. Left ventricular ejection fraction, by estimation, is 55 to 60%. Left ventricular ejection fraction by 3D volume is 56%. The left ventricle has normal function. The left ventricle demonstrates regional wall motion abnormalities (see scoring diagram/findings for description). Left ventricular diastolic parameters are consistent with Grade I diastolic dysfunction (impaired relaxation). 2. Right ventricular systolic function is normal. The right ventricular size is normal. There is normal pulmonary artery systolic pressure. The estimated right ventricular systolic pressure is 69.7 mmHg. 3. The mitral valve is grossly normal. Trivial mitral valve regurgitation. No evidence of mitral stenosis. 4. The aortic valve is tricuspid. There is mild calcification of the aortic valve. There is mild thickening of the aortic valve. Aortic valve regurgitation is not visualized. Mild aortic valve sclerosis is present, with no evidence of aortic valve stenosis. 5. The inferior vena cava is normal in size with greater than 50% respiratory variability, suggesting right atrial pressure of 3 mmHg.  07/06/2021:  Left ventricle cavity is normal in size. Mild concentric hypertrophy of the left ventricle. Mid to distal anteroseptal/anterior mild hypokinesis.  Mild global hypokinesis. LVEF 40-45%. Doppler evidence of grade II (pseudonormal) diastolic dysfunction, elevated LAP.  Left atrial cavity is moderately dilated.  Moderate (Grade II) mitral regurgitation. Mildly restricted mitral valve leaflets.  Moderate tricuspid regurgitation. Moderate pulmonary hypertension.  Estimated pulmonary artery systolic pressure 59 mmHg.  Mild pulmonic  regurgitation.  Previous study on 05/11/2021 noted akinetic mid-apical anterior wall, anteroseptal wall, and apex. LVEF 45-50%, no pulmonary hypertension.   Stress Testing:  No results found for this or any previous visit from the past 1095 days.  Heart Catheterization: 05/11/2021 LM: Normal LAD: Prox 60% stenosis        Mid 95% stenosis just prox to large bifurcating diag        Diag ostium does not seem to be involved.        (Prior cath in 08/2020 noted prox 80% stenosis, followed by long mid to apical LAD occlusion, which seems to have recanalized) LCx: No significant disease RCA: Ostial 90% stenosis         (Significant dampening of pressure noted)         RPL 60% stenosis RCA and LCx system seems largely unchanged compared to 08/2020   Two vessel CAD (Prox-mid LAD and ostial RCA) Elevated LVEDP 29 mmHg   Given acute HFrEF, recommend staged revascularization. Options include CABG (Grafts to LAD, Diag, and RCA) vs PCI to LAD and RCA. Recommend medical management of heart failure over the weekend, followed by revascularization.  05/15/2021: Prox-mid LAD 60-99% stenosis   Successful percutaneous coronary intervention mid-prox LAD PTCA and stent placement 3.5 X 32 mm Synergy drug-eluting stent Proximal post dilatation  using 4.0X15 mm balloon at 22 atm   99-->0% residual stenosis TIMI flow I-->III   Patient is also on eliquis for Afib Recommend Aspirin, plavix, and eliquis for 1 month After that, discontinue Aspirin and continue plavix and eliquis for at least 1 year After 1 year, could consider stopping plavix   Recommend symptoms guided management for ostial RCA stenosis Continue GDMT for HFrEF  LABORATORY DATA:    Latest Ref Rng & Units 05/16/2021    3:32 AM 05/15/2021    3:15 AM 05/14/2021    6:24 AM  CBC  WBC 4.0 - 10.5 K/uL 6.3   6.1   5.5    Hemoglobin 12.0 - 15.0 g/dL 8.7   9.4   9.8    Hematocrit 36.0 - 46.0 % 27.1   29.6   30.7    Platelets 150 - 400 K/uL 158    171   158         Latest Ref Rng & Units 04/19/2022    9:32 AM 10/22/2021    2:31 PM 07/30/2021    3:20 PM  CMP  Glucose 70 - 99 mg/dL 85   87   97    BUN 8 - 27 mg/dL 22   27   25     Creatinine 0.57 - 1.00 mg/dL 1.56   1.79   1.70    Sodium 134 - 144 mmol/L 144   144   146    Potassium 3.5 - 5.2 mmol/L 3.9   4.3   5.0    Chloride 96 - 106 mmol/L 101   105   107    CO2 20 - 29 mmol/L 28   26   22     Calcium 8.7 - 10.3 mg/dL 9.5   9.7   9.5      Lipid Panel     Component Value Date/Time   CHOL 130 08/29/2020 0250   TRIG 89 08/29/2020 0250   HDL 47 08/29/2020 0250   CHOLHDL 2.8 08/29/2020 0250   VLDL 18 08/29/2020 0250   LDLCALC 65 08/29/2020 0250    Lab Results  Component Value Date   HGBA1C 6.1 (H) 05/11/2021   HGBA1C 6.7 (H) 08/27/2020   No components found for: NTPROBNP Lab Results  Component Value Date   TSH 1.054 08/22/2020   TSH 1.190 03/13/2020    Cardiac Panel (last 3 results) No results for input(s): CKTOTAL, CKMB, TROPONINIHS, RELINDX in the last 72 hours.  External Labs: Collected: 03/16/2021 performed by her PCP Creatinine 2.02 mg/dL. eGFR: 24 mL/min per 1.73 m Hemoglobin 12 g/dL, hematocrit 36.9% Sodium 145, potassium 3.6, chloride 101, bicarb 36, BUN 38 TSH 1.62  External Labs: Collected: 06/29/2021 Creatinine 1.50 mg/dL. eGFR: 35 mL/min per 1.73 m Sodium is 142, potassium 4.7, chloride 106, bicarb 29 Hemoglobin 11.6 g/dL, hematocrit 35.2%. NT proBNP 2604.  Hemoglobin A1c   2021-11-16    eAG 108      Hgb A1c 5.4   5.9-1.6  Comp Metabolic Panel   3846-65-99    Albumin 4.1   3.4-4.8  ALP 69   38-126  ALT 10   0-52  Anion Gap 9.9   6.0-20.0  AST 17   0-39  BUN 30   6-26  CO2 33   22-32  CA-corrected 9.44   8.60-10.30  Calcium 9.5   8.6-10.3  Chloride 105   98-107  Creatinine 1.67   0.60-1.30  eGFR2021 31   >60  Glucose 102   70-99  Potassium 4.5   3.5-5.5  Sodium 144   136-145  Protein, Total 6.8   6.0-8.3  TBIL 0.4   0.3-1.0   Microalbumin Panel   2021-11-16    MA/CR ratio <5.7   0.0-30.0  UCR 122      UMA <0.7      Vitamin D 25(OH) Total   2021-11-16    25(OH) Vit D, Total 65.0   30.0-100.0  Lipid Panel w/reflex   2021-11-16    LDL Chol Calc (NIH) 91   0-99  CHOL/HDL 3.1   2.0-4.0  Cholesterol 163   <200  HDLD 53   30-85  LDL Chol Calc (NIH) 91   0-99  NHDL 110   0-129  Triglyceride 107   0-199    IMPRESSION:    ICD-10-CM   1. Chronic combined systolic and diastolic congestive heart failure (HCC)  I50.42 EKG 12-Lead    sacubitril-valsartan (ENTRESTO) 24-26 MG    Basic metabolic panel    Magnesium    Pro b natriuretic peptide (BNP)    Ambulatory referral to Sleep Studies    2. Paroxysmal atrial fibrillation (HCC)  I48.0 Ambulatory referral to Sleep Studies    3. Long term (current) use of anticoagulants  Z79.01     4. Atherosclerosis of native coronary artery of native heart without angina pectoris  I25.10     5. History of coronary angioplasty with insertion of stent  Z95.5 Ambulatory referral to Sleep Studies    6. Mixed hyperlipidemia  E78.2     7. Hx of stroke  Z86.73 Ambulatory referral to Sleep Studies    8. Type 2 diabetes mellitus with stage 3b chronic kidney disease, without long-term current use of insulin (HCC)  E11.22    N18.32     9. Hx of non-ST elevation myocardial infarction (NSTEMI)  I25.2        RECOMMENDATIONS: Kristin [name] is a 81 y.o. female whose past medical history and cardiovascular risk factors include: Paroxysmal atrial fibrillation, NSTEMI (08/2020), multivessel CAD, recovered ischemic cardiomyopathy, chronic HFpEF/stage C/NYHA class II, hx of cerebellar stroke, lumbar stenosis status post laminectomy, hypertension, CKD stage IV, diabetes miellitus type 2, postmenopausal female, advanced age.   Chronic combined systolic and diastolic congestive heart failure (HCC) Euvolemic. Stage C, NYHA class II Medications reconciled. Labs 04/19/2022 independently  reviewed and noted above for further reference. We will reattempt Entresto 24/26 mg p.o. twice daily.  2-week samples provided to the patient.  Repeat labs in 1 week to evaluate kidney function and electrolytes.  EKG: Normal sinus rhythm without underlying injury pattern. Last echo and ischemic work-up reviewed as part of today's office visit. We will refer to sleep medicine for sleep study to evaluate for possible sleep apnea given her comorbidities, fragmented sleep, feeling tired and fatigue.  Paroxysmal atrial fibrillation (HCC) Rate control: Carvedilol. Rhythm control: N/A. Thromboembolic prophylaxis: Eliquis Remains in normal sinus rhythm despite  Long term (current) use of anticoagulants Indication paroxysmal atrial fibrillation. Does not endorse evidence of bleeding. CHA2DS2-VASc SCORE is 9 which correlates to 15.2 % risk of stroke per year (CHF, hypertension, age greater than 98, diabetes, stroke, history of non-STEMI, female). We emphasized the risks, benefits, and alternatives to oral anticoagulation.  Atherosclerosis of native coronary artery of native heart without angina pectoris / History of coronary angioplasty with insertion of stent Denies angina pectoris. No use of sublingual nitroglycerin tablets since last office visit Refill sublingual nitroglycerin. Underwent PCI to the proximal/mid LAD on May 15, 2021. After  1 year therapy of Plavix -May consider discontinuation.  We will discussed with patient and daughter at the next visit. Did not tolerate Toprol-XL in the past-claims that it causes her chest pain.  Mixed hyperlipidemia Currently on Crestor.   She denies myalgia or other side effects. Most recent lipids dated December 2022, independently reviewed as noted above. LDL currently not at goal. Patient would like to focus on lifestyle changes and will discuss up titration of statin therapy at the next office visit based on labs. Currently managed by primary care  provider.  Hx of stroke Educated the importance of secondary prevention.  Type 2 diabetes mellitus with stage 3b chronic kidney disease, without long-term current use of insulin (HCC) Hemoglobin A1c well controlled. Currently managed by primary care provider.  FINAL MEDICATION LIST END OF ENCOUNTER: Meds ordered this encounter  Medications   sacubitril-valsartan (ENTRESTO) 24-26 MG    Sig: Take 1 tablet by mouth 2 (two) times daily.    Dispense:  60 tablet    Refill:  0      Current Outpatient Medications:    benzonatate (TESSALON) 100 MG capsule, Take 100 mg by mouth 3 (three) times daily as needed., Disp: , Rfl:    carvedilol (COREG) 3.125 MG tablet, TAKE 1 TABLET BY MOUTH TWICE DAILY WITH A MEAL, Disp: 180 tablet, Rfl: 0   cholecalciferol (VITAMIN D) 25 MCG (1000 UNIT) tablet, Take 3,000 Units by mouth daily., Disp: , Rfl:    colchicine 0.6 MG tablet, Take 0.5 tablets (0.3 mg total) by mouth daily., Disp: 15 tablet, Rfl: 0   diclofenac Sodium (VOLTAREN) 1 % GEL, Apply 2 g topically 4 (four) times daily., Disp: , Rfl:    ELIQUIS 2.5 MG TABS tablet, Take 1 tablet by mouth twice daily, Disp: 180 tablet, Rfl: 0   FARXIGA 10 MG TABS tablet, TAKE 1 TABLET BY MOUTH ONCE DAILY BEFORE BREAKFAST, Disp: 90 tablet, Rfl: 0   hydrALAZINE (APRESOLINE) 100 MG tablet, TAKE 1 TABLET BY MOUTH THREE TIMES DAILY, Disp: 180 tablet, Rfl: 0   isosorbide mononitrate (IMDUR) 120 MG 24 hr tablet, Take 1 tablet by mouth once daily, Disp: 90 tablet, Rfl: 0   nitroGLYCERIN (NITROSTAT) 0.4 MG SL tablet, Place 1 tablet (0.4 mg total) under the tongue every 5 (five) minutes as needed for up to 30 doses for chest pain., Disp: 30 tablet, Rfl: 0   pantoprazole (PROTONIX) 40 MG tablet, Take 1 tablet (40 mg total) by mouth daily., Disp: 30 tablet, Rfl: 0   rosuvastatin (CRESTOR) 20 MG tablet, Take 1 tablet (20 mg total) by mouth at bedtime., Disp: 90 tablet, Rfl: 1   sacubitril-valsartan (ENTRESTO) 24-26 MG, Take 1  tablet by mouth 2 (two) times daily., Disp: 60 tablet, Rfl: 0  Orders Placed This Encounter  Procedures   Basic metabolic panel   Magnesium   Pro b natriuretic peptide (BNP)   Ambulatory referral to Sleep Studies   EKG 12-Lead     --Continue cardiac medications as reconciled in final medication list. --Return in about 6 months (around 11/03/2022) for Follow up cardiomyopathy, CAD, paroxysmal A-fib. Or sooner if needed. --Continue follow-up with your primary care physician regarding the management of your other chronic comorbid conditions.  Patient's questions and concerns were addressed to her satisfaction. She voices understanding of the instructions provided during this encounter.   This note was created using a voice recognition software as a result there may be grammatical errors inadvertently enclosed that do not reflect the nature  of this encounter. Every attempt is made to correct such errors.  Total time spent at least 40 minutes discussing/reevaluating angina in the setting of complex CAD, up titration of heart failure medications, reviewing outside labs independently and summarizing them above, reviewed the office note from Dr. Santiago Bumpers dating 06/29/2021, plan of care discussed with both the patient and her daughter at today's office visit.  Refills the new medications sent over to her pharmacy and labs ordered.  Discussed disease process and management along with coordination of care.  Rex Kras, [place], [facility]  Pager: [phone] Office: [phone]

## 2022-05-10 DIAGNOSIS — I10 Essential (primary) hypertension: Secondary | ICD-10-CM | POA: Diagnosis not present

## 2022-05-10 DIAGNOSIS — I110 Hypertensive heart disease with heart failure: Secondary | ICD-10-CM | POA: Diagnosis not present

## 2022-05-10 DIAGNOSIS — N184 Chronic kidney disease, stage 4 (severe): Secondary | ICD-10-CM | POA: Diagnosis not present

## 2022-05-10 DIAGNOSIS — I25118 Atherosclerotic heart disease of native coronary artery with other forms of angina pectoris: Secondary | ICD-10-CM | POA: Diagnosis not present

## 2022-05-10 DIAGNOSIS — M1711 Unilateral primary osteoarthritis, right knee: Secondary | ICD-10-CM | POA: Diagnosis not present

## 2022-05-10 DIAGNOSIS — I700 Atherosclerosis of aorta: Secondary | ICD-10-CM | POA: Diagnosis not present

## 2022-05-10 DIAGNOSIS — Z0000 Encounter for general adult medical examination without abnormal findings: Secondary | ICD-10-CM | POA: Diagnosis not present

## 2022-05-10 DIAGNOSIS — F32A Depression, unspecified: Secondary | ICD-10-CM | POA: Diagnosis not present

## 2022-05-10 DIAGNOSIS — E782 Mixed hyperlipidemia: Secondary | ICD-10-CM | POA: Diagnosis not present

## 2022-05-10 DIAGNOSIS — Z7901 Long term (current) use of anticoagulants: Secondary | ICD-10-CM | POA: Diagnosis not present

## 2022-05-10 DIAGNOSIS — E118 Type 2 diabetes mellitus with unspecified complications: Secondary | ICD-10-CM | POA: Diagnosis not present

## 2022-05-10 DIAGNOSIS — I480 Paroxysmal atrial fibrillation: Secondary | ICD-10-CM | POA: Diagnosis not present

## 2022-05-10 DIAGNOSIS — Z23 Encounter for immunization: Secondary | ICD-10-CM | POA: Diagnosis not present

## 2022-05-20 ENCOUNTER — Telehealth: Payer: Self-pay

## 2022-05-20 DIAGNOSIS — I5042 Chronic combined systolic (congestive) and diastolic (congestive) heart failure: Secondary | ICD-10-CM | POA: Diagnosis not present

## 2022-05-20 NOTE — Telephone Encounter (Signed)
CARDIOLOGY TELEPHONE ENCOUNTER 05/20/22  Patient's name: Kristin [name].   MRN: [id number].    DOB: Oct 03, 1941 Primary care provider: Caren Macadam, MD. Primary cardiologist: Rex Kras, DO, [facility]  Interaction regarding this patient's care today: Patient's daughter called stating that she did not feel well after starting Entresto 24/26 mg p.o. twice daily.  Patient has nonspecific symptoms of feeling like there is smoke in the heart, tired, fatigued, etc.  She recently had labs with PCP on May 10, 2022 after starting Entresto.  Labs were reviewed in [place] and noted below for further reference.  Patient has not experienced any chest pain or heart failure symptoms.  Hemoglobin A1c   2022-05-10    eAG 111      Hgb A1c 5.5   4.8-5.6  CBC with Diff   2022-05-10    BA# 0.0   0.0-0.1  BA% 0.9   0.0-1.0  EO# 0.2   0.0-0.6  EO% 3.2   0.0-7.8  HCT 37.8   37.0-47.0  HGB 12.6   12.0-16.0  LY# 2.10   1.10-2.70  LY% 39.4   16.8-43.5  MCH 31.3   27.0-33.0  MCHC 33.3   32.0-36.0  MCV 93.9   81.0-99.0  MO# 0.7   0.3-0.8  MO% 13.5   4.6-12.4  MPV 10.1   7.5-10.7  NE# 2.3   1.9-7.2  NE% 43.0   43.3-71.9  NRBC# 0.00      NRBC% 0.10      PLT 206   150-400  RBC 4.03   4.20-5.40  RDW 13.8   11.5-15.5  WBC 5.4   7.8-58.8  Comp Metabolic Panel   5027-74-12    Albumin 4.2   3.4-4.8  ALP 77   38-126  ALT 16   0-52  Anion Gap 10.9   6.0-20.0  AST 24   0-39  BUN 28   6-26  CO2 33   22-32  CA-corrected 9.57   8.60-10.30  Calcium 9.8   8.6-10.3  Chloride 103   98-107  Creatinine 1.45   0.60-1.30  eGFR2021 36   >60  Glucose 91   70-99  Potassium 4.6   3.5-5.5  Sodium 142   136-145  Protein, Total 7.0   6.0-8.3  TBIL 0.7   0.3-1.0  Microalbumin Panel   2022-05-10    MA/CR ratio <7.5   0.0-30.0  UCR 93      UMA <0.7      Lipid Panel w/reflex   2022-05-10    LDL Chol Calc (NIH) 107   0-99  CHOL/HDL 3.3   2.0-4.0  Cholesterol 181   <200  HDLD 55   30-85  LDL Chol Calc  (NIH) 107   0-99  NHDL 126   0-129  Triglyceride 107   0-199    Impression:   ICD-10-CM   1. Chronic combined systolic and diastolic congestive heart failure (HCC)  I50.42       Recommendations: The symptoms that she is experiencing does not appear to be cardiac in nature.  After starting Entresto 24/26 mg p.o. twice daily at the last office visit her labs remain stable.  She is already held [facility] for the last 3 days -she can hold it for additional few days to see if she feels back to baseline.  Otherwise I would restart it.  Patient is asked to call back if her systolic blood pressures are consistently greater than 130 mmHg further medication up titration.  Telephone encounter total time: 9 minutes  Jennah Satchell  [place] [facility]  Pager: [phone] Office: [phone]

## 2022-05-31 ENCOUNTER — Other Ambulatory Visit: Payer: Self-pay | Admitting: Cardiology

## 2022-05-31 DIAGNOSIS — I5032 Chronic diastolic (congestive) heart failure: Secondary | ICD-10-CM

## 2022-06-04 ENCOUNTER — Other Ambulatory Visit: Payer: Self-pay | Admitting: Cardiology

## 2022-06-04 DIAGNOSIS — I2510 Atherosclerotic heart disease of native coronary artery without angina pectoris: Secondary | ICD-10-CM

## 2022-06-11 ENCOUNTER — Other Ambulatory Visit: Payer: Self-pay | Admitting: Cardiology

## 2022-06-11 DIAGNOSIS — I5042 Chronic combined systolic (congestive) and diastolic (congestive) heart failure: Secondary | ICD-10-CM

## 2022-07-02 ENCOUNTER — Other Ambulatory Visit: Payer: Self-pay | Admitting: Cardiology

## 2022-07-02 DIAGNOSIS — I5042 Chronic combined systolic (congestive) and diastolic (congestive) heart failure: Secondary | ICD-10-CM

## 2022-07-04 ENCOUNTER — Other Ambulatory Visit: Payer: Self-pay

## 2022-07-04 ENCOUNTER — Emergency Department (HOSPITAL_COMMUNITY): Payer: Medicare HMO

## 2022-07-04 ENCOUNTER — Emergency Department (HOSPITAL_COMMUNITY)
Admission: EM | Admit: 2022-07-04 | Discharge: 2022-07-04 | Disposition: A | Discharge status: Home / Self Care | Payer: Medicare HMO | Attending: Emergency Medicine | Admitting: Emergency Medicine

## 2022-07-04 DIAGNOSIS — Z79899 Other long term (current) drug therapy: Secondary | ICD-10-CM | POA: Insufficient documentation

## 2022-07-04 DIAGNOSIS — E1122 Type 2 diabetes mellitus with diabetic chronic kidney disease: Secondary | ICD-10-CM | POA: Diagnosis not present

## 2022-07-04 DIAGNOSIS — I130 Hypertensive heart and chronic kidney disease with heart failure and stage 1 through stage 4 chronic kidney disease, or unspecified chronic kidney disease: Secondary | ICD-10-CM | POA: Diagnosis not present

## 2022-07-04 DIAGNOSIS — I2510 Atherosclerotic heart disease of native coronary artery without angina pectoris: Secondary | ICD-10-CM | POA: Diagnosis not present

## 2022-07-04 DIAGNOSIS — I509 Heart failure, unspecified: Secondary | ICD-10-CM | POA: Diagnosis not present

## 2022-07-04 DIAGNOSIS — N184 Chronic kidney disease, stage 4 (severe): Secondary | ICD-10-CM | POA: Insufficient documentation

## 2022-07-04 DIAGNOSIS — R0789 Other chest pain: Secondary | ICD-10-CM | POA: Insufficient documentation

## 2022-07-04 DIAGNOSIS — I4891 Unspecified atrial fibrillation: Secondary | ICD-10-CM | POA: Diagnosis not present

## 2022-07-04 DIAGNOSIS — I10 Essential (primary) hypertension: Secondary | ICD-10-CM | POA: Diagnosis not present

## 2022-07-04 DIAGNOSIS — R079 Chest pain, unspecified: Secondary | ICD-10-CM | POA: Diagnosis not present

## 2022-07-04 LAB — HEPATIC FUNCTION PANEL
ALT: 13 U/L (ref 0–44)
AST: 20 U/L (ref 15–41)
Albumin: 3.6 g/dL (ref 3.5–5.0)
Alkaline Phosphatase: 60 U/L (ref 38–126)
Bilirubin, Direct: <0.1 mg/dL (ref 0.0–0.2)
Total Bilirubin: 0.2 mg/dL — ABNORMAL LOW (ref 0.3–1.2)
Total Protein: 6.7 g/dL (ref 6.5–8.1)

## 2022-07-04 LAB — CBC
HCT: 39 % (ref 36.0–46.0)
Hemoglobin: 12.3 g/dL (ref 12.0–15.0)
MCH: 31.1 pg (ref 26.0–34.0)
MCHC: 31.5 g/dL (ref 30.0–36.0)
MCV: 98.7 fL (ref 80.0–100.0)
Platelets: 219 10*3/uL (ref 150–400)
RBC: 3.95 MIL/uL (ref 3.87–5.11)
RDW: 12.9 % (ref 11.5–15.5)
WBC: 5.1 10*3/uL (ref 4.0–10.5)
nRBC: 0 % (ref 0.0–0.2)

## 2022-07-04 LAB — LIPASE, BLOOD: Lipase: 23 U/L (ref 11–51)

## 2022-07-04 LAB — BASIC METABOLIC PANEL
Anion gap: 8 (ref 5–15)
BUN: 25 mg/dL — ABNORMAL HIGH (ref 8–23)
CO2: 29 mmol/L (ref 22–32)
Calcium: 9.1 mg/dL (ref 8.9–10.3)
Chloride: 104 mmol/L (ref 98–111)
Creatinine, Ser: 1.5 mg/dL — ABNORMAL HIGH (ref 0.44–1.00)
GFR, Estimated: 35 mL/min — ABNORMAL LOW (ref >60)
Glucose, Bld: 113 mg/dL — ABNORMAL HIGH (ref 70–99)
Potassium: 5 mmol/L (ref 3.5–5.1)
Sodium: 141 mmol/L (ref 135–145)

## 2022-07-04 LAB — TROPONIN I (HIGH SENSITIVITY)
Troponin I (High Sensitivity): 6 ng/L (ref <18)
Troponin I (High Sensitivity): 6 ng/L (ref <18)

## 2022-07-04 IMAGING — CT CT HEAD W/O CM
4 series · 14 of 47 positions shown, 16 images · non-contrast
Comparison: CT head 08/27/2020.  MRI head 08/28/2020.

CLINICAL DATA: Dizziness with nonspecific headache.  Slight nausea.

EXAM:
CT HEAD WITHOUT CONTRAST
TECHNIQUE: Contiguous axial images were obtained from the base of the skull
through the vertex without intravenous contrast.

[Series 3: head without · axial · non-contrast · 0.46mm/px · z∈[-190,-70]mm · 7 of 32 slices shown, 9 images]
[im 4/32  brain]
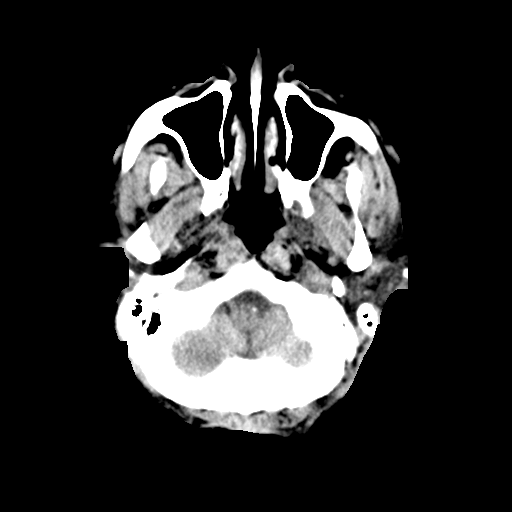
[im 4/32  bone]
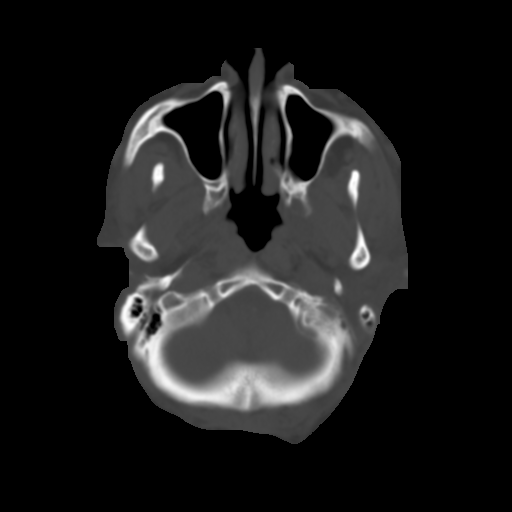
[im 8/32  brain]
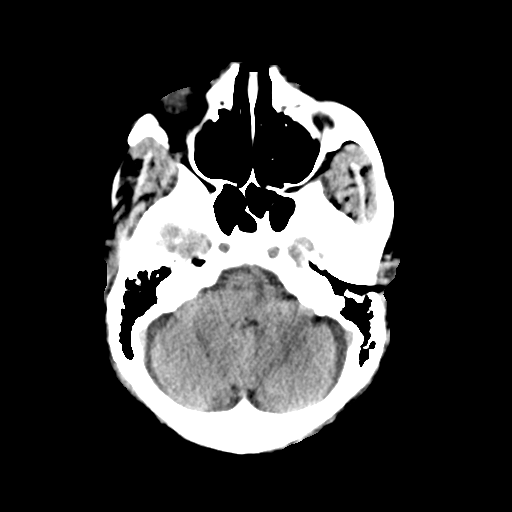
[im 12/32  brain]
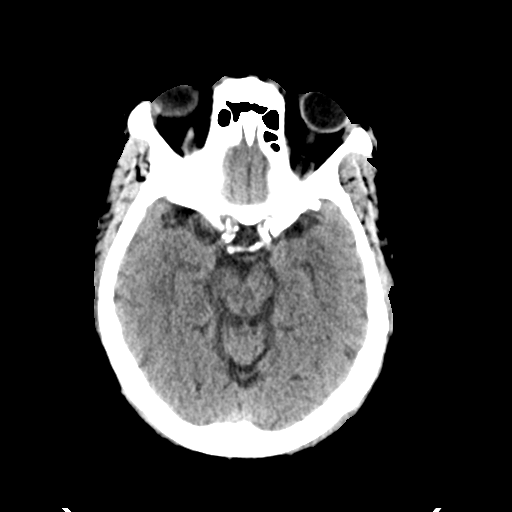
[im 16/32  brain]
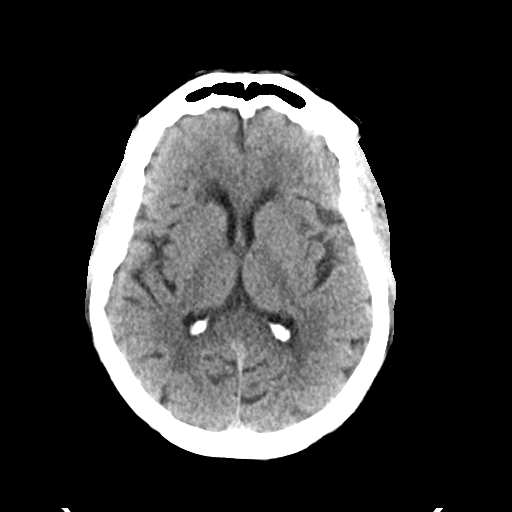
[im 20/32  brain]
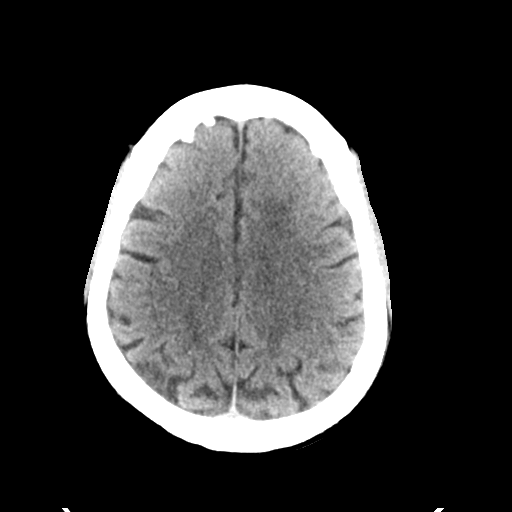
[im 20/32  bone]
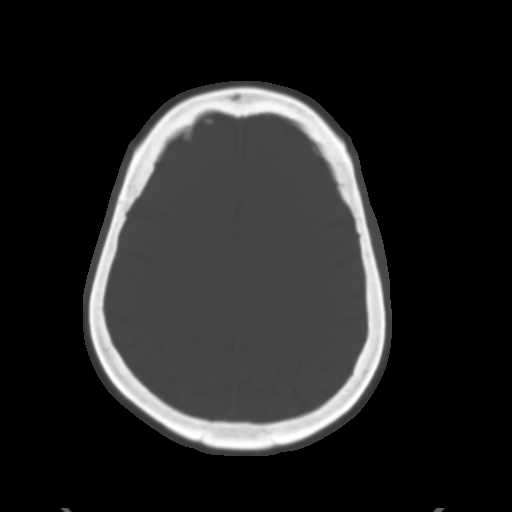
[im 24/32  brain]
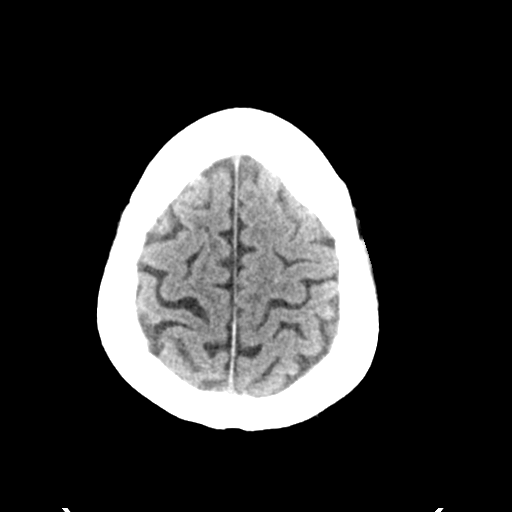
[im 28/32  brain]
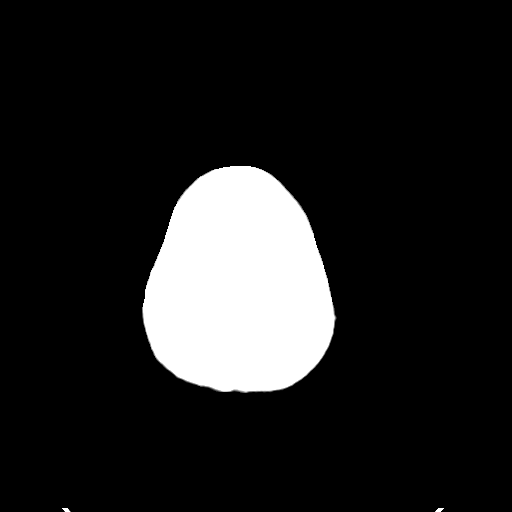

[Series 4: head bone · axial · 0.46mm/px · 1 of 80 slices shown]
[im 8/80  bone]
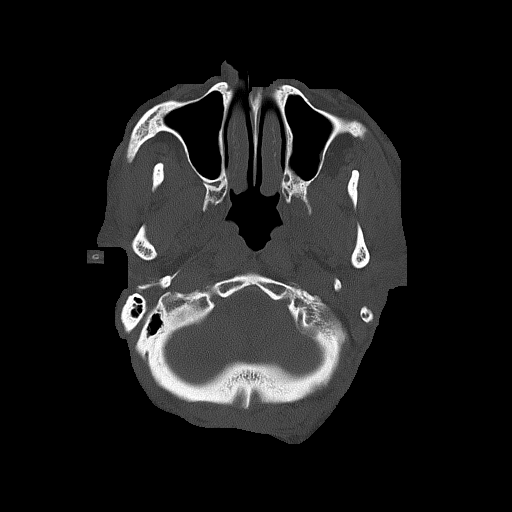

[Series 5: head without cor · coronal · non-contrast · 0.30mm/px · 3 of 67 slices shown]
[im 23/67  brain]
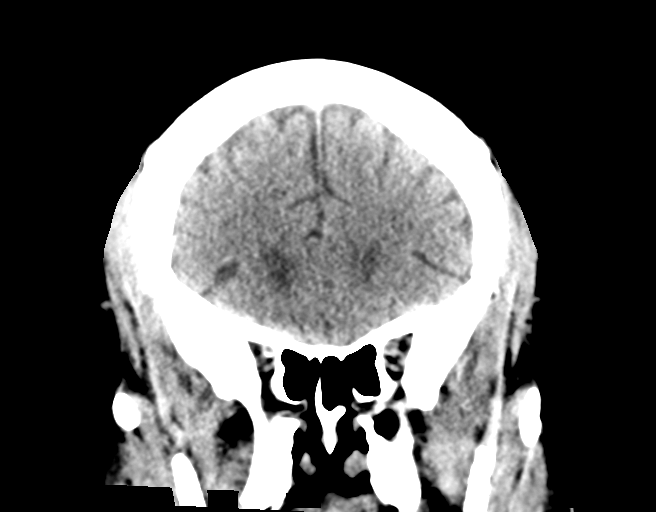
[im 30/67  brain]
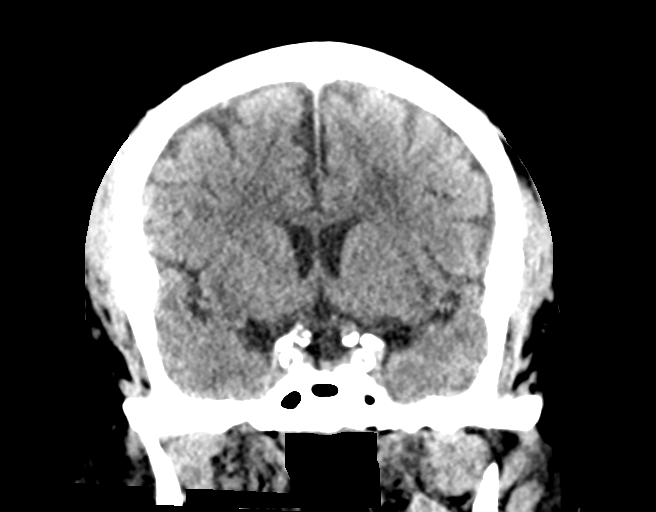
[im 37/67  brain]
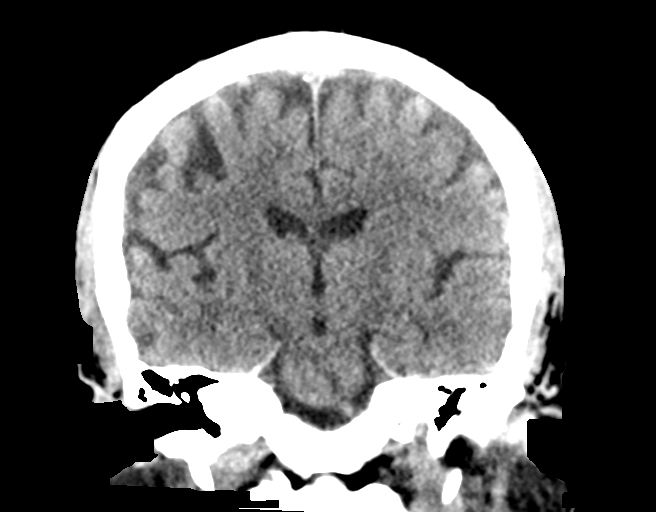

[Series 6: head without sag · sagittal · non-contrast · 0.31mm/px · 3 of 67 slices shown]
[im 23/67  brain]
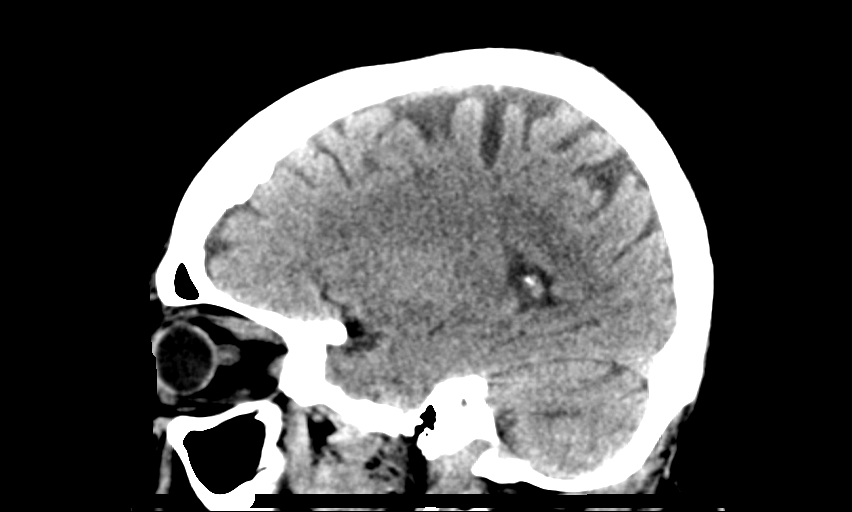
[im 34/67  brain]
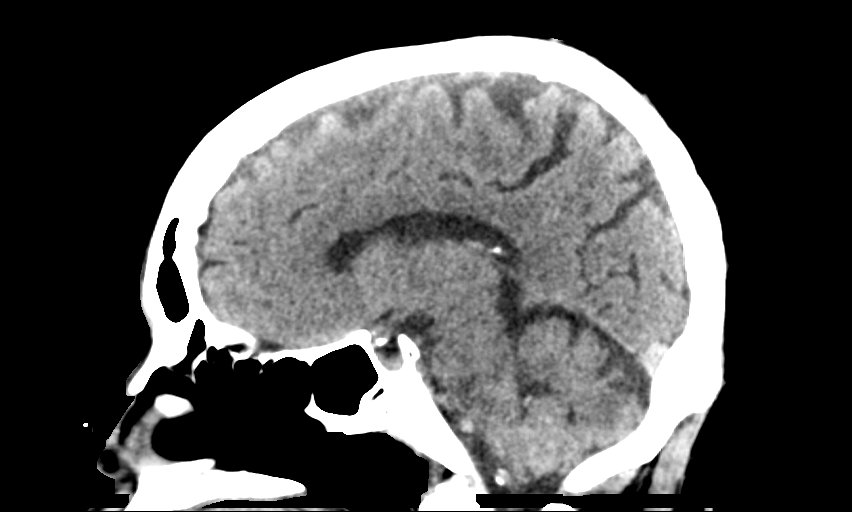
[im 45/67  brain]
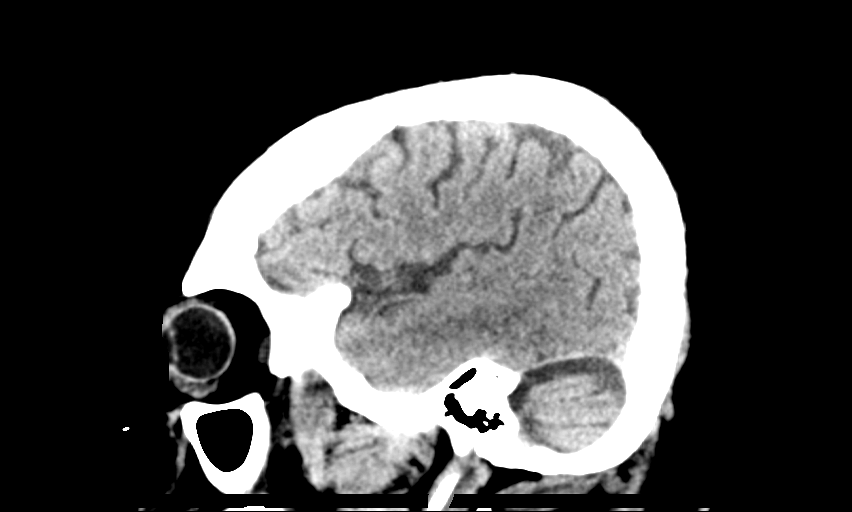

[14 of 47 positions shown; findings below may reference images not displayed]

FINDINGS: Brain: There is no evidence of acute intracranial hemorrhage, mass
lesion, brain edema or extra-axial fluid collection. There is mild
atrophy with mild prominence of the ventricles and subarachnoid
spaces. Interval evolution of previously demonstrated infarcts in
the right parietal lobe and left superior cerebellum. There is no CT
evidence of acute cortical infarction. Patchy small vessel ischemic
changes throughout the periventricular white matter.

Vascular: Intracranial vascular calcifications. No hyperdense vessel
identified.

Skull: Negative for fracture or focal lesion.

Sinuses/Orbits: The visualized paranasal sinuses and mastoid air
cells are clear. No orbital abnormalities are seen.

Other: Previous bilateral lens surgery.
IMPRESSION: 1. No evidence of acute intracranial process.
2. Old right parietal and left superior cerebellar infarcts with
chronic small vessel ischemic changes. No CT evidence of acute
infarct.

## 2022-07-04 MED ORDER — LIDOCAINE VISCOUS HCL 2 % MT SOLN
15.0000 mL | Freq: Once | OROMUCOSAL | Status: AC
  Administered 2022-07-04: 15 mL via ORAL
  Filled 2022-07-04: qty 15

## 2022-07-04 MED ORDER — OXYCODONE-ACETAMINOPHEN 5-325 MG PO TABS: 1.0000 | ORAL_TABLET | Freq: Four times a day (QID) | ORAL | 0 refills | Status: DC | PRN

## 2022-07-04 MED ORDER — ONDANSETRON HCL 4 MG/2ML IJ SOLN
4.0000 mg | Freq: Once | INTRAMUSCULAR | Status: AC
  Administered 2022-07-04: 4 mg via INTRAVENOUS
  Filled 2022-07-04: qty 2

## 2022-07-04 MED ORDER — NITROGLYCERIN 2 % TD OINT
1.0000 [in_us] | TOPICAL_OINTMENT | Freq: Once | TRANSDERMAL | Status: AC
  Administered 2022-07-04: 1 [in_us] via TOPICAL
  Filled 2022-07-04: qty 1

## 2022-07-04 MED ORDER — ALUM & MAG HYDROXIDE-SIMETH 200-200-20 MG/5ML PO SUSP
30.0000 mL | Freq: Once | ORAL | Status: AC
  Administered 2022-07-04: 30 mL via ORAL
  Filled 2022-07-04: qty 30

## 2022-07-04 MED ORDER — MORPHINE SULFATE (PF) 2 MG/ML IV SOLN
2.0000 mg | Freq: Once | INTRAVENOUS | Status: AC
  Administered 2022-07-04: 2 mg via INTRAVENOUS
  Filled 2022-07-04: qty 1

## 2022-07-04 MED ORDER — ASPIRIN 325 MG PO TABS
325.0000 mg | ORAL_TABLET | Freq: Once | ORAL | Status: AC
  Administered 2022-07-04: 325 mg via ORAL
  Filled 2022-07-04: qty 1

## 2022-07-04 MED ORDER — KETOROLAC TROMETHAMINE 15 MG/ML IJ SOLN
15.0000 mg | Freq: Once | INTRAMUSCULAR | Status: AC
  Administered 2022-07-04: 15 mg via INTRAVENOUS
  Filled 2022-07-04: qty 1

## 2022-07-04 NOTE — ED Triage Notes (Signed)
Pt c/o of 9/10 centralized c/p with radiation to the back since 0630 am.    Hx: cardiac stent placement

## 2022-07-04 NOTE — ED Provider Triage Note (Signed)
Emergency Medicine Provider Triage Evaluation Note  Kristin [name] , a 81 y.o. female  was evaluated in triage.  Pt complains of 9 out of 10 chest pain radiating to her back.  Patient states that this began around 630 this morning, she was already awake.  Patient reports that this problem is remained consistent, not progressively worsening.  Patient states has a history of cardiac stent placement 1 year ago due to MI.  Patient states chest pain is radiating to her back, denies shortness of breath or worsening with exertion.  Patient denies nausea, vomiting, abdominal pain.  Review of Systems  Positive:  Negative:   Physical Exam  BP (!) 153/81 (BP Location: Left Arm)   Pulse 78   Temp 98.1 F (36.7 C) (Oral)   Resp 15   SpO2 98%  Gen:   Awake, no distress   Resp:  Normal effort  MSK:   Moves extremities without difficulty  Other:  Lung sounds clear  Medical Decision Making  Medically screening exam initiated at 10:29 AM.  Appropriate orders placed.  Tiffney Haughton was informed that the remainder of the evaluation will be completed by another provider, this initial triage assessment does not replace that evaluation, and the importance of remaining in the ED until their evaluation is complete.     Azucena Cecil, PA-C 07/04/22 1030

## 2022-07-04 NOTE — ED Notes (Signed)
ED PA at BS 

## 2022-07-04 NOTE — ED Provider Notes (Signed)
[facility] EMERGENCY DEPARTMENT Provider Note   CSN: [id number] Arrival date & time: 07/04/22  1008     History  Chief Complaint  Patient presents with   Chest Pain    Kristin [name] is a 81 y.o. female.  81 year old female with past medical history significant for A-fib, multivessel CAD, NSTEMI, CHF, CVA, hypertension, diabetes presents today for evaluation of chest pain.  Pain came on around 6:30 AM.  Patient states she woke up with this pain.  Denies any pain when she went to sleep.  Patient was not woken up by the pain.  Pain radiates to the back.  Does not radiate to upper extremities, denies other associated symptoms.  Pain is substernal.  Patient took 3 doses of nitroglycerin without improvement in pain.  Reports compliance with all of her home cardiac medications.  Patient is running slightly hypertensive with systolic of 761Y.  Patient and daughter both state this is unusual for her and lately her blood pressure has been well controlled.     The history is provided by the patient. No language interpreter was used.       Home Medications Prior to Admission medications   Medication Sig Start Date End Date Taking? Authorizing Provider  benzonatate (TESSALON) 100 MG capsule Take 100 mg by mouth 3 (three) times daily as needed. 06/01/21   [provider]  carvedilol (COREG) 3.125 MG tablet TAKE 1 TABLET BY MOUTH TWICE DAILY WITH A MEAL 04/19/22   Tolia, Sunit, DO  cholecalciferol (VITAMIN D) 25 MCG (1000 UNIT) tablet Take 3,000 Units by mouth daily.    [provider]  colchicine 0.6 MG tablet Take 0.5 tablets (0.3 mg total) by mouth daily. 08/31/20   Aline August, MD  diclofenac Sodium (VOLTAREN) 1 % GEL Apply 2 g topically 4 (four) times daily. 08/30/20   Aline August, MD  ELIQUIS 2.5 MG TABS tablet Take 1 tablet by mouth twice daily 04/25/22   Tolia, Sunit, DO  ENTRESTO 24-26 MG Take 1 tablet by mouth twice daily 07/02/22   Tolia, Sunit,  DO  FARXIGA 10 MG TABS tablet TAKE 1 TABLET BY MOUTH ONCE DAILY BEFORE BREAKFAST 05/31/22   Tolia, Sunit, DO  hydrALAZINE (APRESOLINE) 100 MG tablet TAKE 1 TABLET BY MOUTH THREE TIMES DAILY 06/04/22   Tolia, Sunit, DO  isosorbide mononitrate (IMDUR) 120 MG 24 hr tablet Take 1 tablet by mouth once daily 06/04/22   Tolia, Sunit, DO  nitroGLYCERIN (NITROSTAT) 0.4 MG SL tablet Place 1 tablet (0.4 mg total) under the tongue every 5 (five) minutes as needed for up to 30 doses for chest pain. 05/03/22   Tolia, Sunit, DO  pantoprazole (PROTONIX) 40 MG tablet Take 1 tablet (40 mg total) by mouth daily. 08/30/20   Aline August, MD  rosuvastatin (CRESTOR) 20 MG tablet Take 1 tablet (20 mg total) by mouth at bedtime. 03/16/20   Nicolette Bang, MD      Allergies    Patient has no known allergies.    Review of Systems   Review of Systems  Constitutional:  Negative for chills and fever.  Respiratory:  Negative for shortness of breath.   Cardiovascular:  Positive for chest pain. Negative for palpitations and leg swelling.  Gastrointestinal:  Negative for nausea and vomiting.  Neurological:  Negative for weakness.  All other systems reviewed and are negative.   Physical Exam Updated Vital Signs BP (!) 153/81 (BP Location: Left Arm)   Pulse 78   Temp 98.1  F (36.7 C) (Oral)   Resp 15   SpO2 98%  Physical Exam Vitals and nursing note reviewed.  Constitutional:      General: She is not in acute distress.    Appearance: Normal appearance. She is not ill-appearing.  HENT:     Head: Normocephalic and atraumatic.     Nose: Nose normal.  Eyes:     General: No scleral icterus.    Extraocular Movements: Extraocular movements intact.     Conjunctiva/sclera: Conjunctivae normal.  Cardiovascular:     Rate and Rhythm: Normal rate and regular rhythm.     Pulses: Normal pulses.  Pulmonary:     Effort: Pulmonary effort is normal. No respiratory distress.     Breath sounds: Normal breath sounds. No  wheezing or rales.  Abdominal:     General: There is no distension.     Tenderness: There is no abdominal tenderness.  Musculoskeletal:        General: Normal range of motion.     Cervical back: Normal range of motion.  Skin:    General: Skin is warm and dry.  Neurological:     General: No focal deficit present.     Mental Status: She is alert. Mental status is at baseline.     ED Results / Procedures / Treatments   Labs (all labs ordered are listed, but only abnormal results are displayed) Labs Reviewed  CBC  BASIC METABOLIC PANEL  TROPONIN I (HIGH SENSITIVITY)    EKG None  Radiology DG Chest 2 View  Result Date: 07/04/2022 CLINICAL DATA:  Chest pain.  Hypertension. EXAM: CHEST - 2 VIEW COMPARISON:  05/10/2021 chest x-ray and chest CT. FINDINGS: The cardiac silhouette, mediastinal and hilar contours are within normal limits for age and stable. Mild tortuosity and calcification of the thoracic aorta. Stable moderate eventration of the right hemidiaphragm. No infiltrates, edema or effusions. The bony thorax is intact. IMPRESSION: No acute cardiopulmonary findings. Electronically Signed   By: Marijo Sanes M.D.   On: 07/04/2022 10:44    Procedures Procedures    Medications Ordered in ED Medications  nitroGLYCERIN (NITROGLYN) 2 % ointment 1 inch (has no administration in time range)  aspirin tablet 325 mg (has no administration in time range)  morphine (PF) 2 MG/ML injection 2 mg (has no administration in time range)  ondansetron (ZOFRAN) injection 4 mg (has no administration in time range)    ED Course/ Medical Decision Making/ A&P                           Medical Decision Making Amount and/or Complexity of Data Reviewed Labs: ordered. Radiology: ordered.  Risk OTC drugs. Prescription drug management.   Medical Decision Making / ED Course   This patient presents to the ED for concern of chest pain, this involves an extensive number of treatment options, and  is a complaint that carries with it a high risk of complications and morbidity.  The differential diagnosis includes ACS, PE, aortic dissection, pancreatitis, cholecystitis  MDM: 81 year old female presents with complaint of chest pain.  Past medical history as above.  Significant history of cardiac disease.  Blood pressures have remained stable.  Patient has remained overall well-appearing.  Pulses are symmetrical bilateral upper extremities.  Low suspicion for aortic dissection.  Patient denies shortness of breath, is without tachycardia or hypoxia.  No prior history of DVT or PE.  Low suspicion for PE.  Chest x-ray without acute cardiopulmonary  process.  CBC unremarkable, BMP with renal insufficiency, but this is around baseline.  Hepatic function panel without acute concerns.  EKG without acute ischemic changes.  Troponin negative x2.  Case discussed with Dr. Einar Gip Dr. Einar Gip.  He recommends from cardiac standpoint patient is low risk for ACS.  He does not recommend admission for ongoing chest pain.  Patient was given Toradol, and GI cocktail with minimal improvement in symptoms.  Discussed cardiologist recommendations and they feel comfortable with discharge and follow-up in clinic.  Discussed importance of returning to the emergency room for any worsening in symptoms.  Patient evaluated by Dr. Vallery Ridge as well.  Hepatic function panel unremarkable.  Low risk for biliary disease or pancreatitis as cause of referred pain.  Patient discharged in stable condition.  Return precautions discussed.  Patient encouraged to call and schedule cardiology follow-up as soon as possible.    Additional history obtained: -Additional history obtained from prior cardiology clinic notes confirming patient's past medical history and cardiology work-up -External records from outside source obtained and reviewed including: Chart review including previous notes, labs, imaging, consultation notes   Lab Tests: -I ordered,  reviewed, and interpreted labs.   The pertinent results include:   Labs Reviewed  BASIC METABOLIC PANEL - Abnormal; Notable for the following components:      Result Value   Glucose, Bld 113 (*)    BUN 25 (*)    Creatinine, Ser 1.50 (*)    GFR, Estimated 35 (*)    All other components within normal limits  HEPATIC FUNCTION PANEL - Abnormal; Notable for the following components:   Total Bilirubin 0.2 (*)    All other components within normal limits  CBC  LIPASE, BLOOD  TROPONIN I (HIGH SENSITIVITY)  TROPONIN I (HIGH SENSITIVITY)      EKG  EKG Interpretation  Date/Time:  Thursday July 04 2022 10:22:50 EDT Ventricular Rate:  70 PR Interval:  144 QRS Duration: 84 QT Interval:  386 QTC Calculation: 416 R Axis:   -16 Text Interpretation: Sinus rhythm with Premature atrial complexes When compared with ECG of 16-May-2021 05:08, PREVIOUS ECG IS PRESENT no sig change from previous Confirmed by Charlesetta Shanks [phone]) on 07/04/2022 12:47:50 PM         Imaging Studies ordered: I ordered imaging studies including chest x-ray I independently visualized and interpreted imaging. I agree with the radiologist interpretation   Medicines ordered and prescription drug management: Meds ordered this encounter  Medications   nitroGLYCERIN (NITROGLYN) 2 % ointment 1 inch   aspirin tablet 325 mg   morphine (PF) 2 MG/ML injection 2 mg   ondansetron (ZOFRAN) injection 4 mg   ketorolac (TORADOL) 15 MG/ML injection 15 mg   AND Linked Order Group    alum & mag hydroxide-simeth (MAALOX/MYLANTA) 200-200-20 MG/5ML suspension 30 mL    lidocaine (XYLOCAINE) 2 % viscous mouth solution 15 mL    -I have reviewed the patients home medicines and have made adjustments as needed  Consultations Obtained: I requested consultation with the cardiologist Dr. Einar Gip,  and discussed lab and imaging findings as well as pertinent plan - they recommend: As above   Cardiac Monitoring: The patient was  maintained on a cardiac monitor.  I personally viewed and interpreted the cardiac monitored which showed an underlying rhythm of: Normal sinus rhythm  Co morbidities that complicate the patient evaluation  Past Medical History:  Diagnosis Date   Atrial fibrillation (HCC)    CHF (congestive heart failure) ([place])  Coronary artery disease    Diabetes mellitus without complication (HCC)    Hypercholesteremia    Hypertension    Renal disorder    CKD Stage IV   Stroke [facility])       Dispostion: Patient discharged in stable condition.  Return precautions discussed.  I did discuss and offer patient admission for ongoing chest pain.  However in light of cardiology recommendation they feel comfortable with discharge and follow-up in clinic.  We will provide him with few doses of pain medication to keep on hand as she cannot take NSAIDs secondary to CKD.  Final Clinical Impression(s) / ED Diagnoses Final diagnoses:  Atypical chest pain    Rx / DC Orders ED Discharge Orders          Ordered    oxyCODONE-acetaminophen (PERCOCET/ROXICET) 5-325 MG tablet  Every 6 hours PRN        07/04/22 1534              Evlyn Courier, PA-C 07/04/22 1538    Charlesetta Shanks, MD 07/10/22 [phone]

## 2022-07-04 NOTE — ED Provider Notes (Signed)
I provided a substantive portion of the care of this patient.  I personally performed the entirety of the history for this encounter.  EKG Interpretation  Date/Time:  Thursday July 04 2022 10:22:50 EDT Ventricular Rate:  70 PR Interval:  144 QRS Duration: 84 QT Interval:  386 QTC Calculation: 416 R Axis:   -16 Text Interpretation: Sinus rhythm with Premature atrial complexes When compared with ECG of 16-May-2021 05:08, PREVIOUS ECG IS PRESENT no sig change from previous Confirmed by Charlesetta Shanks [phone]) on 07/04/2022 12:47:50 PM     Charlesetta Shanks, MD 07/10/22 [id number]

## 2022-07-04 NOTE — Discharge Instructions (Addendum)
Your work-up today was overall reassuring.  EKG, heart enzymes did not show any concern for heart attack.  I did discuss your case with cardiologist Dr. Einar Gip who felt reassured regarding her work-up and recommends follow-up in the cardiology clinic.  Please call your cardiologist Dr. Terri Skains to schedule your appointment.  Of also given you a few doses of pain medication to use as you need to.  Please return for any worsening or change in your symptoms.

## 2022-07-04 NOTE — ED Notes (Signed)
No changes. EDP in to see, at [facility]. Family at [facility].

## 2022-07-04 NOTE — ED Notes (Signed)
Patient transported to X-ray 

## 2022-07-04 NOTE — ED Notes (Signed)
Pt alert, NAD, calm, interactive, resps e/u, speaking in clear complete sentences, c/o chest pain, radiates thru to back, denies sob, NV, cough, dizziness, or weakness, mentions tingling in BLE. Family at [facility].

## 2022-07-12 DIAGNOSIS — I129 Hypertensive chronic kidney disease with stage 1 through stage 4 chronic kidney disease, or unspecified chronic kidney disease: Secondary | ICD-10-CM | POA: Diagnosis not present

## 2022-07-12 DIAGNOSIS — N184 Chronic kidney disease, stage 4 (severe): Secondary | ICD-10-CM | POA: Diagnosis not present

## 2022-07-12 DIAGNOSIS — I5020 Unspecified systolic (congestive) heart failure: Secondary | ICD-10-CM | POA: Diagnosis not present

## 2022-07-12 DIAGNOSIS — E1122 Type 2 diabetes mellitus with diabetic chronic kidney disease: Secondary | ICD-10-CM | POA: Diagnosis not present

## 2022-07-12 DIAGNOSIS — K219 Gastro-esophageal reflux disease without esophagitis: Secondary | ICD-10-CM | POA: Diagnosis not present

## 2022-07-12 DIAGNOSIS — I639 Cerebral infarction, unspecified: Secondary | ICD-10-CM | POA: Diagnosis not present

## 2022-07-12 DIAGNOSIS — E785 Hyperlipidemia, unspecified: Secondary | ICD-10-CM | POA: Diagnosis not present

## 2022-07-18 ENCOUNTER — Other Ambulatory Visit: Payer: Self-pay | Admitting: Cardiology

## 2022-07-18 DIAGNOSIS — I2510 Atherosclerotic heart disease of native coronary artery without angina pectoris: Secondary | ICD-10-CM

## 2022-07-29 ENCOUNTER — Other Ambulatory Visit: Payer: Self-pay | Admitting: Cardiology

## 2022-07-29 DIAGNOSIS — I480 Paroxysmal atrial fibrillation: Secondary | ICD-10-CM

## 2022-07-29 DIAGNOSIS — Z7901 Long term (current) use of anticoagulants: Secondary | ICD-10-CM

## 2022-08-02 ENCOUNTER — Ambulatory Visit: Payer: Medicare HMO | Admitting: Cardiology

## 2022-08-02 ENCOUNTER — Encounter: Payer: Self-pay | Admitting: Cardiology

## 2022-08-02 VITALS — BP 146/83 | HR 81 | Temp 98.0°F | Resp 14 | Ht 64.0 in | Wt 194.2 lb

## 2022-08-02 DIAGNOSIS — I5042 Chronic combined systolic (congestive) and diastolic (congestive) heart failure: Secondary | ICD-10-CM

## 2022-08-02 DIAGNOSIS — Z955 Presence of coronary angioplasty implant and graft: Secondary | ICD-10-CM

## 2022-08-02 DIAGNOSIS — R072 Precordial pain: Secondary | ICD-10-CM | POA: Diagnosis not present

## 2022-08-02 DIAGNOSIS — E782 Mixed hyperlipidemia: Secondary | ICD-10-CM

## 2022-08-02 DIAGNOSIS — I480 Paroxysmal atrial fibrillation: Secondary | ICD-10-CM | POA: Diagnosis not present

## 2022-08-02 DIAGNOSIS — Z7901 Long term (current) use of anticoagulants: Secondary | ICD-10-CM | POA: Diagnosis not present

## 2022-08-02 DIAGNOSIS — I2510 Atherosclerotic heart disease of native coronary artery without angina pectoris: Secondary | ICD-10-CM

## 2022-08-02 DIAGNOSIS — N1832 Chronic kidney disease, stage 3b: Secondary | ICD-10-CM

## 2022-08-02 DIAGNOSIS — Z8673 Personal history of transient ischemic attack (TIA), and cerebral infarction without residual deficits: Secondary | ICD-10-CM

## 2022-08-02 MED ORDER — ENTRESTO 49-51 MG PO TABS: 1.0000 | ORAL_TABLET | Freq: Two times a day (BID) | ORAL | 0 refills | Status: DC

## 2022-08-02 MED ORDER — NITROGLYCERIN 0.4 MG SL SUBL
0.4000 mg | SUBLINGUAL_TABLET | SUBLINGUAL | 0 refills | Status: DC | PRN
Start: 2022-08-02 — End: 2024-04-09

## 2022-08-02 MED ORDER — ROSUVASTATIN CALCIUM 40 MG PO TABS: 40.0000 mg | ORAL_TABLET | Freq: Every day | ORAL | 0 refills | Status: DC

## 2022-08-02 NOTE — Progress Notes (Signed)
[name] Date of Birth: 1941/11/08 MRN: [id number] Primary Care Provider:Hagler, Apolonio Schneiders, MD Primary Cardiologist: Rex Kras, DO, [facility] (established care 08/22/2020) Primary nephrologist: Dr. Santiago Bumpers  Date: 08/02/22 Last Office Visit: 05/03/2022  Chief Complaint  Patient presents with  . Chest Pain       . Shortness of Breath  . Hospitalization Follow-up    HPI  Kristin [name] is a 81 y.o.  female whose past medical history and cardiovascular risk factors include: Paroxysmal atrial fibrillation, NSTEMI (08/2020), multivessel CAD, recovered ischemic cardiomyopathy, chronic HFpEF/stage C/NYHA class II, hx of cerebellar stroke, lumbar stenosis status post laminectomy, hypertension, CKD stage III/IV, diabetes miellitus type 2, postmenopausal female, advanced age.   Patient presents to the office accompanied by her daughter [name].   Patient establish care back in September 2021 while she was hospitalized for bilateral lower extremity weakness and back pain.  She had undergone L2-L4 laminectomy and subsequently was diagnosed with non-STEMI with a hs troponin greater than 27,000.  She also developed new onset of atrial fibrillation during that hospitalization and due to not being a anticoagulant candidate during the hospitalization due to recent neurosurgery she also had a stroke involving the cerebellar distribution.  Subsequently was managed medically until recently in June 2022 went to ED for chest pain suggestive of angina pectoris.  She underwent left heart catheterization and was found to have LAD/RCA disease and after discussing the risks versus benefit of high risk PCI versus CABG the shared decision was to proceed with coronary intervention.  She underwent PCI to the proximal/mid LAD successfully prior to discharge.  Since then her medical therapy has been uptitrated in a stepwise fashion and hemodynamics and labs remain relatively stable.  Since last office visit  patient has not had any anginal discomfort or heart failure symptoms.  She is tolerating oral anticoagulation well without any side effects or intolerances.  Outside labs from Apr 19, 2022 independently reviewed.  Renal function relatively at baseline NT proBNP improving.  Patient has not been on Entresto/ARB given her renal function.   FUNCTIONAL STATUS: No structured exercise program or daily routine.   ALLERGIES: No Known Allergies  MEDICATION LIST PRIOR TO VISIT: Current Outpatient Medications on File Prior to Visit  Medication Sig Dispense Refill  . benzonatate (TESSALON) 100 MG capsule Take 100 mg by mouth 3 (three) times daily as needed.    . carvedilol (COREG) 3.125 MG tablet TAKE 1 TABLET BY MOUTH TWICE DAILY WITH A MEAL 180 tablet 0  . cholecalciferol (VITAMIN D) 25 MCG (1000 UNIT) tablet Take 3,000 Units by mouth daily.    . colchicine 0.6 MG tablet Take 0.5 tablets (0.3 mg total) by mouth daily. (Patient taking differently: Take 0.6 mg by mouth daily.) 15 tablet 0  . diclofenac Sodium (VOLTAREN) 1 % GEL Apply 2 g topically 4 (four) times daily.    Marland Kitchen ELIQUIS 2.5 MG TABS tablet Take 1 tablet by mouth twice daily 180 tablet 0  . ENTRESTO 24-26 MG Take 1 tablet by mouth twice daily 60 tablet 0  . FARXIGA 10 MG TABS tablet TAKE 1 TABLET BY MOUTH ONCE DAILY BEFORE BREAKFAST 90 tablet 0  . hydrALAZINE (APRESOLINE) 100 MG tablet TAKE 1 TABLET BY MOUTH THREE TIMES DAILY 180 tablet 0  . isosorbide mononitrate (IMDUR) 120 MG 24 hr tablet Take 1 tablet by mouth once daily 90 tablet 0  . nitroGLYCERIN (NITROSTAT) 0.4 MG SL tablet Place 1 tablet (0.4 mg total) under the tongue every 5 (five)  minutes as needed for up to 30 doses for chest pain. 30 tablet 0  . pantoprazole (PROTONIX) 40 MG tablet Take 1 tablet (40 mg total) by mouth daily. 30 tablet 0  . rosuvastatin (CRESTOR) 20 MG tablet Take 1 tablet (20 mg total) by mouth at bedtime. 90 tablet 1   No current facility-administered  medications on file prior to visit.    PAST MEDICAL HISTORY: Past Medical History:  Diagnosis Date  . Atrial fibrillation ([place])   . CHF (congestive heart failure) ([place])   . Coronary artery disease   . Diabetes mellitus without complication ([place])   . Hypercholesteremia   . Hypertension   . Renal disorder    CKD Stage IV  . Stroke [facility])     PAST SURGICAL HISTORY: Past Surgical History:  Procedure Laterality Date  . CORONARY STENT INTERVENTION N/A 05/15/2021   Procedure: CORONARY STENT INTERVENTION;  Surgeon: Nigel Mormon, MD;  Location: [facility];  Service: Cardiovascular;  Laterality: N/A;  . LEFT HEART CATH AND CORONARY ANGIOGRAPHY N/A 05/11/2021   Procedure: LEFT HEART CATH AND CORONARY ANGIOGRAPHY;  Surgeon: Nigel Mormon, MD;  Location: [facility];  Service: Cardiovascular;  Laterality: N/A;  . LUMBAR LAMINECTOMY/DECOMPRESSION MICRODISCECTOMY N/A 08/19/2020   Procedure: LUMBAR LAMINECTOMY/DECOMPRESSION  Lumbar two-three, Lumbar three-four;  Surgeon: Dawley, Theodoro Doing, DO;  Location: [facility];  Service: Neurosurgery;  Laterality: N/A;  . RIGHT/LEFT HEART CATH AND CORONARY ANGIOGRAPHY N/A 08/23/2020   Procedure: RIGHT/LEFT HEART CATH AND CORONARY ANGIOGRAPHY;  Surgeon: [name], MD;  Location: [facility];  Service: Cardiovascular;  Laterality: N/A;    FAMILY HISTORY: The patient's family history includes Diabetes in her father; Heart attack in her father; Heart disease in her mother; Hypertension in her sister, sister, sister, sister, sister, sister, sister, sister, sister, sister, sister, and sister.   SOCIAL HISTORY:  The patient  reports that she has never smoked. She has never used smokeless tobacco. She reports that she does not drink alcohol and does not use drugs.  Review of Systems  Constitutional: Positive for weight loss. Negative for chills and fever.  HENT:  Negative for hoarse voice and nosebleeds.        Difficultly hearing   Eyes:   Negative for blurred vision, discharge, double vision and pain.  Cardiovascular:  Positive for dyspnea on exertion (with overexertion- chronic and stable). Negative for chest pain, claudication, leg swelling, near-syncope, orthopnea, palpitations, paroxysmal nocturnal dyspnea and syncope.  Respiratory:  Negative for hemoptysis and shortness of breath.   Musculoskeletal:  Positive for back pain and joint pain. Negative for muscle cramps and myalgias.       Unsteady with gait, but no falls.   Gastrointestinal:  Negative for abdominal pain, constipation, diarrhea, hematemesis, hematochezia, melena, nausea and vomiting.  Neurological:  Positive for paresthesias (hands and legs). Negative for dizziness and light-headedness.    PHYSICAL EXAM:    08/02/2022    9:30 AM 07/04/2022    3:30 PM 07/04/2022    3:15 PM  Vitals with BMI  Height 5' 4"     Weight 194 lbs 3 oz    BMI 41.93    Systolic 790 240 973  Diastolic 83 87 98  Pulse 81 81 68   CONSTITUTIONAL: Age-appropriate female, hemodynamically stable, no acute distress.  SKIN: Skin is warm and dry. No rash noted. No cyanosis. No pallor. No jaundice HEAD: Normocephalic and atraumatic.  EYES: No scleral icterus MOUTH/THROAT: Moist oral membranes.  NECK:  No JVD present. No thyromegaly noted. No carotid bruits  CHEST Normal respiratory effort. No intercostal retractions  LUNGS: Clear to auscultation bilaterally.  No stridor. No wheezes. No rales.  CARDIOVASCULAR: Regular rate and rhythm, positive J2-I7, soft holosystolic murmur heard at the apex, no rubs or gallops appreciated ABDOMINAL: Obese, soft, nontender, nondistended, positive bowel sounds in all 4 quadrants, no apparent ascites.  EXTREMITIES: No peripheral edema, warm to touch, 2+ DP and PT pulses. HEMATOLOGIC: No significant bruising NEUROLOGIC: Oriented to person, place, and time. Nonfocal. Normal muscle tone.  PSYCHIATRIC: Normal mood and affect. Normal behavior.  Cooperative  CARDIAC DATABASE: EKG: 08/02/2022: Sinus rhythm, 79 bpm, LAE, LVH with voltage criteria, consider old anterior infarct, ST-T changes likely secondary to repolarization abnormality but ischemia cannot be ruled out.  Echocardiogram: 12/15/2020 1. The distal inferoseptum and apex appear mildly hypokinetic, but WMA is much improved compared with prior study. LVEF has improved considerably. Left ventricular ejection fraction, by estimation, is 55 to 60%. Left ventricular ejection fraction by 3D volume is 56%. The left ventricle has normal function. The left ventricle demonstrates regional wall motion abnormalities (see scoring diagram/findings for description). Left ventricular diastolic parameters are consistent with Grade I diastolic dysfunction (impaired relaxation). 2. Right ventricular systolic function is normal. The right ventricular size is normal. There is normal pulmonary artery systolic pressure. The estimated right ventricular systolic pressure is 86.7 mmHg. 3. The mitral valve is grossly normal. Trivial mitral valve regurgitation. No evidence of mitral stenosis. 4. The aortic valve is tricuspid. There is mild calcification of the aortic valve. There is mild thickening of the aortic valve. Aortic valve regurgitation is not visualized. Mild aortic valve sclerosis is present, with no evidence of aortic valve stenosis. 5. The inferior vena cava is normal in size with greater than 50% respiratory variability, suggesting right atrial pressure of 3 mmHg.  07/06/2021:  Left ventricle cavity is normal in size. Mild concentric hypertrophy of the left ventricle. Mid to distal anteroseptal/anterior mild hypokinesis.  Mild global hypokinesis. LVEF 40-45%. Doppler evidence of grade II (pseudonormal) diastolic dysfunction, elevated LAP.  Left atrial cavity is moderately dilated.  Moderate (Grade II) mitral regurgitation. Mildly restricted mitral valve leaflets.  Moderate tricuspid  regurgitation. Moderate pulmonary hypertension.  Estimated pulmonary artery systolic pressure 59 mmHg.  Mild pulmonic regurgitation.  Previous study on 05/11/2021 noted akinetic mid-apical anterior wall, anteroseptal wall, and apex. LVEF 45-50%, no pulmonary hypertension.   Stress Testing:  No results found for this or any previous visit from the past 1095 days.  Heart Catheterization: 05/11/2021 LM: Normal LAD: Prox 60% stenosis        Mid 95% stenosis just prox to large bifurcating diag        Diag ostium does not seem to be involved.        (Prior cath in 08/2020 noted prox 80% stenosis, followed by long mid to apical LAD occlusion, which seems to have recanalized) LCx: No significant disease RCA: Ostial 90% stenosis         (Significant dampening of pressure noted)         RPL 60% stenosis RCA and LCx system seems largely unchanged compared to 08/2020   Two vessel CAD (Prox-mid LAD and ostial RCA) Elevated LVEDP 29 mmHg   Given acute HFrEF, recommend staged revascularization. Options include CABG (Grafts to LAD, Diag, and RCA) vs PCI to LAD and RCA. Recommend medical management of heart failure over the weekend, followed by revascularization.  05/15/2021: Prox-mid LAD 60-99% stenosis  Successful percutaneous coronary intervention mid-prox LAD PTCA and stent placement 3.5 X 32 mm Synergy drug-eluting stent Proximal post dilatation using 4.0X15 mm balloon at 22 atm   99-->0% residual stenosis TIMI flow I-->III   Patient is also on eliquis for Afib Recommend Aspirin, plavix, and eliquis for 1 month After that, discontinue Aspirin and continue plavix and eliquis for at least 1 year After 1 year, could consider stopping plavix   Recommend symptoms guided management for ostial RCA stenosis Continue GDMT for HFrEF  LABORATORY DATA: Hemoglobin A1c   2022-05-10    eAG 111      Hgb A1c 5.5   4.8-5.6  CBC with Diff   2022-05-10    BA# 0.0   0.0-0.1  BA% 0.9   0.0-1.0  EO#  0.2   0.0-0.6  EO% 3.2   0.0-7.8  HCT 37.8   37.0-47.0  HGB 12.6   12.0-16.0  LY# 2.10   1.10-2.70  LY% 39.4   16.8-43.5  MCH 31.3   27.0-33.0  MCHC 33.3   32.0-36.0  MCV 93.9   81.0-99.0  MO# 0.7   0.3-0.8  MO% 13.5   4.6-12.4  MPV 10.1   7.5-10.7  NE# 2.3   1.9-7.2  NE% 43.0   43.3-71.9  NRBC# 0.00      NRBC% 0.10      PLT 206   150-400  RBC 4.03   4.20-5.40  RDW 13.8   11.5-15.5  WBC 5.4   9.4-85.4  Comp Metabolic Panel   6270-35-00    Albumin 4.2   3.4-4.8  ALP 77   38-126  ALT 16   0-52  Anion Gap 10.9   6.0-20.0  AST 24   0-39  BUN 28   6-26  CO2 33   22-32  CA-corrected 9.57   8.60-10.30  Calcium 9.8   8.6-10.3  Chloride 103   98-107  Creatinine 1.45   0.60-1.30  eGFR2021 36   >60  Glucose 91   70-99  Potassium 4.6   3.5-5.5  Sodium 142   136-145  Protein, Total 7.0   6.0-8.3  TBIL 0.7   0.3-1.0  Microalbumin Panel   2022-05-10    MA/CR ratio <7.5   0.0-30.0  UCR 93      UMA <0.7      Lipid Panel w/reflex   2022-05-10    LDL Chol Calc (NIH) 107   0-99  CHOL/HDL 3.3   2.0-4.0  Cholesterol 181   <200  HDLD 55   30-85  LDL Chol Calc (NIH) 107   0-99  NHDL 126   0-129  Triglyceride 107   0-199     IMPRESSION:    ICD-10-CM   1. Chest pain of uncertain etiology  X38.1 EKG 12-Lead    2. Shortness of breath  R06.02        RECOMMENDATIONS: Jaime Dome is a 81 y.o. female whose past medical history and cardiovascular risk factors include: Paroxysmal atrial fibrillation, NSTEMI (08/2020), multivessel CAD, recovered ischemic cardiomyopathy, chronic HFpEF/stage C/NYHA class II, hx of cerebellar stroke, lumbar stenosis status post laminectomy, hypertension, CKD stage IV, diabetes miellitus type 2, postmenopausal female, advanced age.   Chronic combined systolic and diastolic congestive heart failure (HCC) Euvolemic. Stage C, NYHA class II Medications reconciled. Labs 04/19/2022 independently reviewed and noted above for further reference. We will  reattempt Entresto 24/26 mg p.o. twice daily.  2-week samples provided to the patient.  Repeat labs in 1 week to evaluate kidney function  and electrolytes.  EKG: Normal sinus rhythm without underlying injury pattern. Last echo and ischemic work-up reviewed as part of today's office visit. We will refer to sleep medicine for sleep study to evaluate for possible sleep apnea given her comorbidities, fragmented sleep, feeling tired and fatigue.  Paroxysmal atrial fibrillation (HCC) Rate control: Carvedilol. Rhythm control: N/A. Thromboembolic prophylaxis: Eliquis Remains in normal sinus rhythm despite  Long term (current) use of anticoagulants Indication paroxysmal atrial fibrillation. Does not endorse evidence of bleeding. CHA2DS2-VASc SCORE is 9 which correlates to 15.2 % risk of stroke per year (CHF, hypertension, age greater than 19, diabetes, stroke, history of non-STEMI, female). We emphasized the risks, benefits, and alternatives to oral anticoagulation.  Atherosclerosis of native coronary artery of native heart without angina pectoris / History of coronary angioplasty with insertion of stent Denies angina pectoris. No use of sublingual nitroglycerin tablets since last office visit Refill sublingual nitroglycerin. Underwent PCI to the proximal/mid LAD on May 15, 2021. After 1 year therapy of Plavix -May consider discontinuation.  We will discussed with patient and daughter at the next visit. Did not tolerate Toprol-XL in the past-claims that it causes her chest pain.  Mixed hyperlipidemia Currently on Crestor.   She denies myalgia or other side effects. Most recent lipids dated December 2022, independently reviewed as noted above. LDL currently not at goal. Patient would like to focus on lifestyle changes and will discuss up titration of statin therapy at the next office visit based on labs. Currently managed by primary care provider.  Hx of stroke Educated the importance of  secondary prevention.  Type 2 diabetes mellitus with stage 3b chronic kidney disease, without long-term current use of insulin (HCC) Hemoglobin A1c well controlled. Currently managed by primary care provider.  FINAL MEDICATION LIST END OF ENCOUNTER: No orders of the defined types were placed in this encounter.     Current Outpatient Medications:  .  benzonatate (TESSALON) 100 MG capsule, Take 100 mg by mouth 3 (three) times daily as needed., Disp: , Rfl:  .  carvedilol (COREG) 3.125 MG tablet, TAKE 1 TABLET BY MOUTH TWICE DAILY WITH A MEAL, Disp: 180 tablet, Rfl: 0 .  cholecalciferol (VITAMIN D) 25 MCG (1000 UNIT) tablet, Take 3,000 Units by mouth daily., Disp: , Rfl:  .  colchicine 0.6 MG tablet, Take 0.5 tablets (0.3 mg total) by mouth daily. (Patient taking differently: Take 0.6 mg by mouth daily.), Disp: 15 tablet, Rfl: 0 .  diclofenac Sodium (VOLTAREN) 1 % GEL, Apply 2 g topically 4 (four) times daily., Disp: , Rfl:  .  ELIQUIS 2.5 MG TABS tablet, Take 1 tablet by mouth twice daily, Disp: 180 tablet, Rfl: 0 .  ENTRESTO 24-26 MG, Take 1 tablet by mouth twice daily, Disp: 60 tablet, Rfl: 0 .  FARXIGA 10 MG TABS tablet, TAKE 1 TABLET BY MOUTH ONCE DAILY BEFORE BREAKFAST, Disp: 90 tablet, Rfl: 0 .  hydrALAZINE (APRESOLINE) 100 MG tablet, TAKE 1 TABLET BY MOUTH THREE TIMES DAILY, Disp: 180 tablet, Rfl: 0 .  isosorbide mononitrate (IMDUR) 120 MG 24 hr tablet, Take 1 tablet by mouth once daily, Disp: 90 tablet, Rfl: 0 .  nitroGLYCERIN (NITROSTAT) 0.4 MG SL tablet, Place 1 tablet (0.4 mg total) under the tongue every 5 (five) minutes as needed for up to 30 doses for chest pain., Disp: 30 tablet, Rfl: 0 .  pantoprazole (PROTONIX) 40 MG tablet, Take 1 tablet (40 mg total) by mouth daily., Disp: 30 tablet, Rfl: 0 .  rosuvastatin (CRESTOR)  20 MG tablet, Take 1 tablet (20 mg total) by mouth at bedtime., Disp: 90 tablet, Rfl: 1  Orders Placed This Encounter  Procedures  . EKG 12-Lead      --Continue cardiac medications as reconciled in final medication list. --No follow-ups on file. Or sooner if needed. --Continue follow-up with your primary care physician regarding the management of your other chronic comorbid conditions.  Patient's questions and concerns were addressed to her satisfaction. She voices understanding of the instructions provided during this encounter.   This note was created using a voice recognition software as a result there may be grammatical errors inadvertently enclosed that do not reflect the nature of this encounter. Every attempt is made to correct such errors.  Total time spent at least 40 minutes discussing/reevaluating angina in the setting of complex CAD, up titration of heart failure medications, reviewing outside labs independently and summarizing them above, reviewed the office note from Dr. Santiago Bumpers dating 06/29/2021, plan of care discussed with both the patient and her daughter at today's office visit.  Refills the new medications sent over to her pharmacy and labs ordered.  Discussed disease process and management along with coordination of care.  Rex Kras, [place], [facility]  Pager: [phone] Office: [phone]

## 2022-08-08 ENCOUNTER — Other Ambulatory Visit: Payer: Self-pay | Admitting: Cardiology

## 2022-08-08 DIAGNOSIS — I2510 Atherosclerotic heart disease of native coronary artery without angina pectoris: Secondary | ICD-10-CM

## 2022-08-16 ENCOUNTER — Other Ambulatory Visit: Payer: Self-pay

## 2022-08-16 DIAGNOSIS — I480 Paroxysmal atrial fibrillation: Secondary | ICD-10-CM | POA: Diagnosis not present

## 2022-08-16 DIAGNOSIS — I5042 Chronic combined systolic (congestive) and diastolic (congestive) heart failure: Secondary | ICD-10-CM

## 2022-08-17 LAB — BASIC METABOLIC PANEL
BUN/Creatinine Ratio: 14 (ref 12–28)
BUN: 20 mg/dL (ref 8–27)
CO2: 26 mmol/L (ref 20–29)
Calcium: 9.8 mg/dL (ref 8.7–10.3)
Chloride: 103 mmol/L (ref 96–106)
Creatinine, Ser: 1.47 mg/dL — ABNORMAL HIGH (ref 0.57–1.00)
Glucose: 104 mg/dL — ABNORMAL HIGH (ref 70–99)
Potassium: 4.3 mmol/L (ref 3.5–5.2)
Sodium: 143 mmol/L (ref 134–144)
eGFR: 35 mL/min/{1.73_m2} — ABNORMAL LOW (ref >59)

## 2022-08-17 LAB — PRO B NATRIURETIC PEPTIDE: NT-Pro BNP: 785 pg/mL — ABNORMAL HIGH (ref 0–738)

## 2022-08-17 LAB — MAGNESIUM: Magnesium: 2.4 mg/dL — ABNORMAL HIGH (ref 1.6–2.3)

## 2022-08-21 NOTE — Progress Notes (Signed)
Tried calling patient no answer unable to leave a vm

## 2022-08-23 NOTE — Progress Notes (Signed)
Tried calling patient again no answer

## 2022-08-23 NOTE — Progress Notes (Signed)
Patient is aware 

## 2022-08-30 ENCOUNTER — Other Ambulatory Visit: Payer: Self-pay | Admitting: Cardiology

## 2022-08-30 DIAGNOSIS — I5032 Chronic diastolic (congestive) heart failure: Secondary | ICD-10-CM

## 2022-09-25 ENCOUNTER — Other Ambulatory Visit: Payer: Self-pay | Admitting: Cardiology

## 2022-09-25 DIAGNOSIS — I2510 Atherosclerotic heart disease of native coronary artery without angina pectoris: Secondary | ICD-10-CM

## 2022-09-27 ENCOUNTER — Ambulatory Visit: Payer: Medicare HMO | Admitting: Cardiology

## 2022-09-27 ENCOUNTER — Encounter: Payer: Self-pay | Admitting: Cardiology

## 2022-09-27 VITALS — BP 138/82 | HR 85 | Temp 97.6°F | Resp 16 | Ht 64.0 in | Wt 192.4 lb

## 2022-09-27 DIAGNOSIS — I480 Paroxysmal atrial fibrillation: Secondary | ICD-10-CM

## 2022-09-27 DIAGNOSIS — I5042 Chronic combined systolic (congestive) and diastolic (congestive) heart failure: Secondary | ICD-10-CM

## 2022-09-27 DIAGNOSIS — Z955 Presence of coronary angioplasty implant and graft: Secondary | ICD-10-CM | POA: Diagnosis not present

## 2022-09-27 DIAGNOSIS — E782 Mixed hyperlipidemia: Secondary | ICD-10-CM

## 2022-09-27 DIAGNOSIS — Z7901 Long term (current) use of anticoagulants: Secondary | ICD-10-CM

## 2022-09-27 DIAGNOSIS — I2510 Atherosclerotic heart disease of native coronary artery without angina pectoris: Secondary | ICD-10-CM

## 2022-09-27 DIAGNOSIS — E1122 Type 2 diabetes mellitus with diabetic chronic kidney disease: Secondary | ICD-10-CM

## 2022-09-27 DIAGNOSIS — Z8673 Personal history of transient ischemic attack (TIA), and cerebral infarction without residual deficits: Secondary | ICD-10-CM

## 2022-09-27 NOTE — Progress Notes (Signed)
Kristin [name] Date of Birth: 07-19-1941 MRN: [id number] Primary Care Provider:Hagler, Apolonio Schneiders, MD Primary Cardiologist: Rex Kras, DO, [facility] (established care 08/22/2020) Primary nephrologist: Dr. Santiago Bumpers  Date: 09/27/22 Last Office Visit: 08/02/2022  Chief Complaint  Patient presents with   Follow-up    Reevaluation of chest pain, HFpEF    HPI  Kristin [name] is a 81 y.o.  female whose past medical history and cardiovascular risk factors include: Paroxysmal atrial fibrillation, NSTEMI (08/2020), multivessel CAD, recovered ischemic cardiomyopathy, chronic HFpEF/stage C/NYHA class II, hx of cerebellar stroke, lumbar stenosis status post laminectomy, hypertension, CKD stage III/IV, diabetes miellitus type 2, postmenopausal female, advanced age.   Patient presents to the office accompanied by her daughter [name].   At the last office visit patient had mentioned symptoms of precordial pain requiring a visit to the ED.  At that time her high sensitive troponins were negative x2, EKG did not illustrate STEMI, and her symptoms resolved with GI cocktail.  However, given her advanced and complex vascular history with CVA, A-fib, CAD, prior PCI the shared decision was to monitor her closely.  She presents today for follow-up.  Since last office visit patient has not had any anginal discomfort or heart failure symptoms.  No near-syncope or syncopal events.  She voices that she has a heavy head but unable to describe anything else in detail.  She denies any focal neurological deficits, no trouble speaking, no expressive or receptive aphasia..   FUNCTIONAL STATUS: No structured exercise program or daily routine.   ALLERGIES: No Known Allergies  MEDICATION LIST PRIOR TO VISIT: Current Outpatient Medications on File Prior to Visit  Medication Sig Dispense Refill   carvedilol (COREG) 3.125 MG tablet TAKE 1 TABLET BY MOUTH TWICE DAILY WITH A MEAL 180 tablet 0   cholecalciferol (VITAMIN  D) 25 MCG (1000 UNIT) tablet Take 3,000 Units by mouth daily.     colchicine 0.6 MG tablet Take 0.5 tablets (0.3 mg total) by mouth daily. (Patient taking differently: Take 0.6 mg by mouth daily.) 15 tablet 0   diclofenac Sodium (VOLTAREN) 1 % GEL Apply 2 g topically 4 (four) times daily.     ELIQUIS 2.5 MG TABS tablet Take 1 tablet by mouth twice daily 180 tablet 0   FARXIGA 10 MG TABS tablet TAKE 1 TABLET BY MOUTH ONCE DAILY BEFORE BREAKFAST 90 tablet 0   hydrALAZINE (APRESOLINE) 100 MG tablet TAKE 1 TABLET BY MOUTH THREE TIMES DAILY 180 tablet 0   isosorbide mononitrate (IMDUR) 120 MG 24 hr tablet Take 1 tablet by mouth once daily 90 tablet 0   nitroGLYCERIN (NITROSTAT) 0.4 MG SL tablet Place 1 tablet (0.4 mg total) under the tongue every 5 (five) minutes as needed for up to 30 doses for chest pain. 30 tablet 0   pantoprazole (PROTONIX) 40 MG tablet Take 1 tablet (40 mg total) by mouth daily. 30 tablet 0   rosuvastatin (CRESTOR) 40 MG tablet Take 1 tablet (40 mg total) by mouth at bedtime. 90 tablet 0   sacubitril-valsartan (ENTRESTO) 49-51 MG Take 1 tablet by mouth 2 (two) times daily. 180 tablet 0   No current facility-administered medications on file prior to visit.    PAST MEDICAL HISTORY: Past Medical History:  Diagnosis Date   Atrial fibrillation (HCC)    CHF (congestive heart failure) ([place])    Coronary artery disease    Diabetes mellitus without complication ([place])    Hypercholesteremia    Hypertension    Renal disorder  CKD Stage IV   Stroke ([place])     PAST SURGICAL HISTORY: Past Surgical History:  Procedure Laterality Date   CORONARY STENT INTERVENTION N/A 05/15/2021   Procedure: CORONARY STENT INTERVENTION;  Surgeon: Nigel Mormon, MD;  Location: [place] [facility];  Service: Cardiovascular;  Laterality: N/A;   LEFT HEART CATH AND CORONARY ANGIOGRAPHY N/A 05/11/2021   Procedure: LEFT HEART CATH AND CORONARY ANGIOGRAPHY;  Surgeon: Nigel Mormon, MD;  Location:  [place] [facility];  Service: Cardiovascular;  Laterality: N/A;   LUMBAR LAMINECTOMY/DECOMPRESSION MICRODISCECTOMY N/A 08/19/2020   Procedure: LUMBAR LAMINECTOMY/DECOMPRESSION  Lumbar two-three, Lumbar three-four;  Surgeon: Dawley, Theodoro Doing, DO;  Location: [place];  Service: Neurosurgery;  Laterality: N/A;   RIGHT/LEFT HEART CATH AND CORONARY ANGIOGRAPHY N/A 08/23/2020   Procedure: RIGHT/LEFT HEART CATH AND CORONARY ANGIOGRAPHY;  Surgeon: [name], MD;  Location: [place] [facility];  Service: Cardiovascular;  Laterality: N/A;    FAMILY HISTORY: The patient's family history includes Diabetes in her father; Heart attack in her father; Heart disease in her mother; Hypertension in her sister, sister, sister, sister, sister, sister, sister, sister, sister, sister, sister, and sister.   SOCIAL HISTORY:  The patient  reports that she has never smoked. She has never used smokeless tobacco. She reports that she does not drink alcohol and does not use drugs.  Review of Systems  Constitutional: Negative for chills and fever.  HENT:  Negative for hoarse voice and nosebleeds.        Difficultly hearing   Eyes:  Negative for blurred vision, discharge, double vision and pain.  Cardiovascular:  Positive for dyspnea on exertion (with overexertion- chronic and stable). Negative for chest pain, claudication, leg swelling, near-syncope, orthopnea, palpitations, paroxysmal nocturnal dyspnea and syncope.  Respiratory:  Negative for hemoptysis and shortness of breath.   Musculoskeletal:  Positive for back pain and joint pain. Negative for muscle cramps and myalgias.       Unsteady with gait, but no falls.   Gastrointestinal:  Negative for abdominal pain, constipation, diarrhea, hematemesis, hematochezia, melena, nausea and vomiting.  Neurological:  Positive for paresthesias (hands and legs). Negative for dizziness and light-headedness.    PHYSICAL EXAM:    09/27/2022    1:26 PM 08/02/2022    9:30 AM 07/04/2022     3:30 PM  Vitals with BMI  Height 5' 4"  5' 4"    Weight 192 lbs 6 oz 194 lbs 3 oz   BMI 63.84 66.59   Systolic 935 701 779  Diastolic 82 83 87  Pulse 85 81 81   CONSTITUTIONAL: Age-appropriate female, hemodynamically stable, no acute distress.  SKIN: Skin is warm and dry. No rash noted. No cyanosis. No pallor. No jaundice HEAD: Normocephalic and atraumatic.  EYES: No scleral icterus MOUTH/THROAT: Moist oral membranes.  NECK: No JVD present. No thyromegaly noted. No carotid bruits  CHEST Normal respiratory effort. No intercostal retractions  LUNGS: Clear to auscultation bilaterally.  No stridor. No wheezes. No rales.  CARDIOVASCULAR: Regular rate and rhythm, positive T9-Q3, soft holosystolic murmur heard at the apex, no rubs or gallops appreciated ABDOMINAL: Obese, soft, nontender, nondistended, positive bowel sounds in all 4 quadrants, no apparent ascites.  EXTREMITIES: No peripheral edema, warm to touch, 2+ DP and PT pulses. HEMATOLOGIC: No significant bruising NEUROLOGIC: Oriented to person, place, and time. Nonfocal. Normal muscle tone.  PSYCHIATRIC: Normal mood and affect. Normal behavior. Cooperative  CARDIAC DATABASE: EKG: 08/02/2022: Sinus rhythm, 79 bpm, LAE, LVH with voltage criteria, consider old anterior  infarct, ST-T changes likely secondary to repolarization abnormality but ischemia cannot be ruled out.  Echocardiogram: 12/15/2020 1. The distal inferoseptum and apex appear mildly hypokinetic, but WMA is much improved compared with prior study. LVEF has improved considerably. Left ventricular ejection fraction, by estimation, is 55 to 60%. Left ventricular ejection fraction by 3D volume is 56%. The left ventricle has normal function. The left ventricle demonstrates regional wall motion abnormalities (see scoring diagram/findings for description). Left ventricular diastolic parameters are consistent with Grade I diastolic dysfunction (impaired relaxation). 2. Right ventricular  systolic function is normal. The right ventricular size is normal. There is normal pulmonary artery systolic pressure. The estimated right ventricular systolic pressure is 53.6 mmHg. 3. The mitral valve is grossly normal. Trivial mitral valve regurgitation. No evidence of mitral stenosis. 4. The aortic valve is tricuspid. There is mild calcification of the aortic valve. There is mild thickening of the aortic valve. Aortic valve regurgitation is not visualized. Mild aortic valve sclerosis is present, with no evidence of aortic valve stenosis. 5. The inferior vena cava is normal in size with greater than 50% respiratory variability, suggesting right atrial pressure of 3 mmHg.  07/06/2021:  Left ventricle cavity is normal in size. Mild concentric hypertrophy of the left ventricle. Mid to distal anteroseptal/anterior mild hypokinesis. Mild global hypokinesis. LVEF 40-45%. Doppler evidence of grade II (pseudonormal) diastolic dysfunction, elevated LAP.  Left atrial cavity is moderately dilated.  Moderate (Grade II) mitral regurgitation. Mildly restricted mitral valve leaflets.  Moderate tricuspid regurgitation. Moderate pulmonary hypertension.  Estimated pulmonary artery systolic pressure 59 mmHg.  Mild pulmonic regurgitation.  Previous study on 05/11/2021 noted akinetic mid-apical anterior wall, anteroseptal wall, and apex. LVEF 45-50%, no pulmonary hypertension.   Stress Testing:  No results found for this or any previous visit from the past 1095 days.  Heart Catheterization: 05/11/2021 LM: Normal LAD: Prox 60% stenosis        Mid 95% stenosis just prox to large bifurcating diag        Diag ostium does not seem to be involved.        (Prior cath in 08/2020 noted prox 80% stenosis, followed by long mid to apical LAD occlusion, which seems to have recanalized) LCx: No significant disease RCA: Ostial 90% stenosis         (Significant dampening of pressure noted)         RPL 60% stenosis RCA and LCx  system seems largely unchanged compared to 08/2020   Two vessel CAD (Prox-mid LAD and ostial RCA) Elevated LVEDP 29 mmHg   Given acute HFrEF, recommend staged revascularization. Options include CABG (Grafts to LAD, Diag, and RCA) vs PCI to LAD and RCA. Recommend medical management of heart failure over the weekend, followed by revascularization.  05/15/2021: Prox-mid LAD 60-99% stenosis   Successful percutaneous coronary intervention mid-prox LAD PTCA and stent placement 3.5 X 32 mm Synergy drug-eluting stent Proximal post dilatation using 4.0X15 mm balloon at 22 atm   99-->0% residual stenosis TIMI flow I-->III   Patient is also on eliquis for Afib Recommend Aspirin, plavix, and eliquis for 1 month After that, discontinue Aspirin and continue plavix and eliquis for at least 1 year After 1 year, could consider stopping plavix   Recommend symptoms guided management for ostial RCA stenosis Continue GDMT for HFrEF  LABORATORY DATA: Hemoglobin A1c   2022-05-10    eAG 111      Hgb A1c 5.5   4.8-5.6  CBC with Diff   2022-05-10  BA# 0.0   0.0-0.1  BA% 0.9   0.0-1.0  EO# 0.2   0.0-0.6  EO% 3.2   0.0-7.8  HCT 37.8   37.0-47.0  HGB 12.6   12.0-16.0  LY# 2.10   1.10-2.70  LY% 39.4   16.8-43.5  MCH 31.3   27.0-33.0  MCHC 33.3   32.0-36.0  MCV 93.9   81.0-99.0  MO# 0.7   0.3-0.8  MO% 13.5   4.6-12.4  MPV 10.1   7.5-10.7  NE# 2.3   1.9-7.2  NE% 43.0   43.3-71.9  NRBC# 0.00      NRBC% 0.10      PLT 206   150-400  RBC 4.03   4.20-5.40  RDW 13.8   11.5-15.5  WBC 5.4   5.2-77.8  Comp Metabolic Panel   2423-53-61    Albumin 4.2   3.4-4.8  ALP 77   38-126  ALT 16   0-52  Anion Gap 10.9   6.0-20.0  AST 24   0-39  BUN 28   6-26  CO2 33   22-32  CA-corrected 9.57   8.60-10.30  Calcium 9.8   8.6-10.3  Chloride 103   98-107  Creatinine 1.45   0.60-1.30  eGFR2021 36   >60  Glucose 91   70-99  Potassium 4.6   3.5-5.5  Sodium 142   136-145  Protein, Total 7.0   6.0-8.3   TBIL 0.7   0.3-1.0  Microalbumin Panel   2022-05-10    MA/CR ratio <7.5   0.0-30.0  UCR 93      UMA <0.7      Lipid Panel w/reflex   2022-05-10    LDL Chol Calc (NIH) 107   0-99  CHOL/HDL 3.3   2.0-4.0  Cholesterol 181   <200  HDLD 55   30-85  LDL Chol Calc (NIH) 107   0-99  NHDL 126   0-129  Triglyceride 107   0-199      Latest Ref Rng & Units 07/04/2022   10:30 AM 05/16/2021    3:32 AM 05/15/2021    3:15 AM  CBC  WBC 4.0 - 10.5 K/uL 5.1  6.3  6.1   Hemoglobin 12.0 - 15.0 g/dL 12.3  8.7  9.4   Hematocrit 36.0 - 46.0 % 39.0  27.1  29.6   Platelets 150 - 400 K/uL 219  158  171        Latest Ref Rng & Units 08/16/2022    9:09 AM 07/04/2022   12:40 PM 07/04/2022   10:30 AM  CMP  Glucose 70 - 99 mg/dL 104   113   BUN 8 - 27 mg/dL 20   25   Creatinine 0.57 - 1.00 mg/dL 1.47   1.50   Sodium 134 - 144 mmol/L 143   141   Potassium 3.5 - 5.2 mmol/L 4.3   5.0   Chloride 96 - 106 mmol/L 103   104   CO2 20 - 29 mmol/L 26   29   Calcium 8.7 - 10.3 mg/dL 9.8   9.1   Total Protein 6.5 - 8.1 g/dL  6.7    Total Bilirubin 0.3 - 1.2 mg/dL  0.2    Alkaline Phos 38 - 126 U/L  60    AST 15 - 41 U/L  20    ALT 0 - 44 U/L  13      Recent Labs    10/22/21 1432 04/19/22 0932 08/16/22 0909  PROBNP 1,719* 696 785*  BMP Recent Labs    04/19/22 0932 07/04/22 1030 08/16/22 0909  NA 144 141 143  K 3.9 5.0 4.3  CL 101 104 103  CO2 28 29 26   GLUCOSE 85 113* 104*  BUN 22 25* 20  CREATININE 1.56* 1.50* 1.47*  CALCIUM 9.5 9.1 9.8  GFRNONAA  --  35*  --     HEMOGLOBIN A1C Lab Results  Component Value Date   HGBA1C 6.1 (H) 05/11/2021   MPG 128 05/11/2021    IMPRESSION:    ICD-10-CM   1. Atherosclerosis of native coronary artery of native heart without angina pectoris  I25.10 CMP14+EGFR    Lipid Panel With LDL/HDL Ratio    LDL cholesterol, direct    2. History of coronary angioplasty with insertion of stent  Z95.5     3. Chronic combined systolic and diastolic congestive  heart failure (HCC)  I50.42     4. Paroxysmal atrial fibrillation (HCC)  I48.0     5. Long term (current) use of anticoagulants  Z79.01     6. Mixed hyperlipidemia  E78.2 CMP14+EGFR    Lipid Panel With LDL/HDL Ratio    LDL cholesterol, direct    7. Hx of stroke  Z86.73     8. Type 2 diabetes mellitus with stage 3b chronic kidney disease, without long-term current use of insulin [facility])  E11.22    N18.32        RECOMMENDATIONS: Ahmira Boisselle is a 81 y.o. female whose past medical history and cardiovascular risk factors include: Paroxysmal atrial fibrillation, NSTEMI (08/2020), multivessel CAD, recovered ischemic cardiomyopathy, chronic HFpEF/stage C/NYHA class II, hx of cerebellar stroke, lumbar stenosis status post laminectomy, hypertension, CKD stage IV, diabetes miellitus type 2, postmenopausal female, advanced age.   Atherosclerosis of native coronary artery of native heart without angina pectoris / History of coronary angioplasty with insertion of stent Denies angina pectoris since last office visit. Since she has not had any anginal discomfort since last office visit the shared decision is to hold off on ischemic work-up at this time as she is asymptomatic on current medical therapy No use of sublingual nitroglycerin. Underwent PCI to the proximal/mid LAD on May 15, 2021. Did not tolerate Toprol-XL in the past-claims that it causes her chest pain. Reemphasized the importance of improving her modifiable cardiovascular risk factors. We will check fasting lipid profile.  Chronic combined systolic and diastolic congestive heart failure (HCC) Euvolemic. Stage C, NYHA class II Medications reconciled. We will hold off on up titration of GDMT due to her underlying renal function.  We will await labs. Home blood pressure log reviewed SBP ranges between 130-140 mmHg. I had referred her to sleep medicine for evaluation of sleep apnea-pending.    Paroxysmal atrial fibrillation  (HCC) Rate control: Carvedilol. Rhythm control: N/A. Thromboembolic prophylaxis: Eliquis  Long term (current) use of anticoagulants Indication paroxysmal atrial fibrillation. Does not endorse evidence of bleeding. CHA2DS2-VASc SCORE is 9 which correlates to 15.2 % risk of stroke per year (CHF, hypertension, age greater than 65, diabetes, stroke, history of non-STEMI, female). We emphasized the risks, benefits, and alternatives to oral anticoagulation.  Mixed hyperlipidemia Currently on Crestor  She denies myalgia or other side effects. LDL currently not at goal. Check a fasting lipid profile.  If lipids are not well controlled we will consider the addition of Repatha given her other comorbid conditions.  Hx of stroke Educated the importance of secondary prevention.  Type 2 diabetes mellitus with stage 3b chronic kidney disease, without long-term  current use of insulin (HCC) Hemoglobin A1c well controlled. Currently managed by primary care provider.  FINAL MEDICATION LIST END OF ENCOUNTER: No orders of the defined types were placed in this encounter.     Current Outpatient Medications:    carvedilol (COREG) 3.125 MG tablet, TAKE 1 TABLET BY MOUTH TWICE DAILY WITH A MEAL, Disp: 180 tablet, Rfl: 0   cholecalciferol (VITAMIN D) 25 MCG (1000 UNIT) tablet, Take 3,000 Units by mouth daily., Disp: , Rfl:    colchicine 0.6 MG tablet, Take 0.5 tablets (0.3 mg total) by mouth daily. (Patient taking differently: Take 0.6 mg by mouth daily.), Disp: 15 tablet, Rfl: 0   diclofenac Sodium (VOLTAREN) 1 % GEL, Apply 2 g topically 4 (four) times daily., Disp: , Rfl:    ELIQUIS 2.5 MG TABS tablet, Take 1 tablet by mouth twice daily, Disp: 180 tablet, Rfl: 0   FARXIGA 10 MG TABS tablet, TAKE 1 TABLET BY MOUTH ONCE DAILY BEFORE BREAKFAST, Disp: 90 tablet, Rfl: 0   hydrALAZINE (APRESOLINE) 100 MG tablet, TAKE 1 TABLET BY MOUTH THREE TIMES DAILY, Disp: 180 tablet, Rfl: 0   isosorbide mononitrate (IMDUR)  120 MG 24 hr tablet, Take 1 tablet by mouth once daily, Disp: 90 tablet, Rfl: 0   nitroGLYCERIN (NITROSTAT) 0.4 MG SL tablet, Place 1 tablet (0.4 mg total) under the tongue every 5 (five) minutes as needed for up to 30 doses for chest pain., Disp: 30 tablet, Rfl: 0   pantoprazole (PROTONIX) 40 MG tablet, Take 1 tablet (40 mg total) by mouth daily., Disp: 30 tablet, Rfl: 0   rosuvastatin (CRESTOR) 40 MG tablet, Take 1 tablet (40 mg total) by mouth at bedtime., Disp: 90 tablet, Rfl: 0   sacubitril-valsartan (ENTRESTO) 49-51 MG, Take 1 tablet by mouth 2 (two) times daily., Disp: 180 tablet, Rfl: 0  Orders Placed This Encounter  Procedures   CMP14+EGFR   Lipid Panel With LDL/HDL Ratio   LDL cholesterol, direct   --Continue cardiac medications as reconciled in final medication list. --Return in about 6 months (around 03/29/2023) for Follow up, CAD. Or sooner if needed. --Continue follow-up with your primary care physician regarding the management of your other chronic comorbid conditions.  Patient's questions and concerns were addressed to her satisfaction. She voices understanding of the instructions provided during this encounter.   This note was created using a voice recognition software as a result there may be grammatical errors inadvertently enclosed that do not reflect the nature of this encounter. Every attempt is made to correct such errors.  Rex Kras, [place], [facility]  Pager: [phone] Office: [phone]

## 2022-10-04 DIAGNOSIS — E782 Mixed hyperlipidemia: Secondary | ICD-10-CM | POA: Diagnosis not present

## 2022-10-04 DIAGNOSIS — I2510 Atherosclerotic heart disease of native coronary artery without angina pectoris: Secondary | ICD-10-CM | POA: Diagnosis not present

## 2022-10-05 LAB — CMP14+EGFR
ALT: 14 IU/L (ref 0–32)
AST: 24 IU/L (ref 0–40)
Albumin/Globulin Ratio: 1.5 (ref 1.2–2.2)
Albumin: 4 g/dL (ref 3.7–4.7)
Alkaline Phosphatase: 82 IU/L (ref 44–121)
BUN/Creatinine Ratio: 15 (ref 12–28)
BUN: 22 mg/dL (ref 8–27)
Bilirubin Total: 0.4 mg/dL (ref 0.0–1.2)
CO2: 27 mmol/L (ref 20–29)
Calcium: 9.7 mg/dL (ref 8.7–10.3)
Chloride: 101 mmol/L (ref 96–106)
Creatinine, Ser: 1.5 mg/dL — ABNORMAL HIGH (ref 0.57–1.00)
Globulin, Total: 2.7 g/dL (ref 1.5–4.5)
Glucose: 101 mg/dL — ABNORMAL HIGH (ref 70–99)
Potassium: 4.6 mmol/L (ref 3.5–5.2)
Sodium: 140 mmol/L (ref 134–144)
Total Protein: 6.7 g/dL (ref 6.0–8.5)
eGFR: 35 mL/min/{1.73_m2} — ABNORMAL LOW (ref >59)

## 2022-10-05 LAB — LIPID PANEL WITH LDL/HDL RATIO
Cholesterol, Total: 169 mg/dL (ref 100–199)
HDL: 58 mg/dL (ref >39)
LDL Chol Calc (NIH): 94 mg/dL (ref 0–99)
LDL/HDL Ratio: 1.6 ratio (ref 0.0–3.2)
Triglycerides: 95 mg/dL (ref 0–149)
VLDL Cholesterol Cal: 17 mg/dL (ref 5–40)

## 2022-10-05 LAB — LDL CHOLESTEROL, DIRECT: LDL Direct: 99 mg/dL (ref 0–99)

## 2022-10-09 ENCOUNTER — Other Ambulatory Visit: Payer: Self-pay

## 2022-10-09 DIAGNOSIS — I2510 Atherosclerotic heart disease of native coronary artery without angina pectoris: Secondary | ICD-10-CM

## 2022-10-09 MED ORDER — EZETIMIBE 10 MG PO TABS: 10.0000 mg | ORAL_TABLET | Freq: Every day | ORAL | 0 refills | Status: DC

## 2022-10-09 NOTE — Progress Notes (Signed)
Called and spoke to patient's daughter she voiced understanding. Medication has been sent and orders have been places and released

## 2022-10-11 ENCOUNTER — Other Ambulatory Visit: Payer: Self-pay | Admitting: Cardiology

## 2022-10-11 DIAGNOSIS — I2510 Atherosclerotic heart disease of native coronary artery without angina pectoris: Secondary | ICD-10-CM

## 2022-10-18 DIAGNOSIS — H524 Presbyopia: Secondary | ICD-10-CM | POA: Diagnosis not present

## 2022-10-18 DIAGNOSIS — H04123 Dry eye syndrome of bilateral lacrimal glands: Secondary | ICD-10-CM | POA: Diagnosis not present

## 2022-10-18 DIAGNOSIS — H40013 Open angle with borderline findings, low risk, bilateral: Secondary | ICD-10-CM | POA: Diagnosis not present

## 2022-10-18 DIAGNOSIS — E119 Type 2 diabetes mellitus without complications: Secondary | ICD-10-CM | POA: Diagnosis not present

## 2022-10-30 ENCOUNTER — Other Ambulatory Visit: Payer: Self-pay | Admitting: Cardiology

## 2022-10-30 DIAGNOSIS — I480 Paroxysmal atrial fibrillation: Secondary | ICD-10-CM

## 2022-10-30 DIAGNOSIS — Z7901 Long term (current) use of anticoagulants: Secondary | ICD-10-CM

## 2022-11-01 ENCOUNTER — Other Ambulatory Visit: Payer: Self-pay | Admitting: Cardiology

## 2022-11-01 DIAGNOSIS — I2510 Atherosclerotic heart disease of native coronary artery without angina pectoris: Secondary | ICD-10-CM

## 2022-11-07 ENCOUNTER — Other Ambulatory Visit: Payer: Self-pay | Admitting: Cardiology

## 2022-11-07 DIAGNOSIS — E782 Mixed hyperlipidemia: Secondary | ICD-10-CM

## 2022-11-07 DIAGNOSIS — I5042 Chronic combined systolic (congestive) and diastolic (congestive) heart failure: Secondary | ICD-10-CM

## 2022-11-07 DIAGNOSIS — I2510 Atherosclerotic heart disease of native coronary artery without angina pectoris: Secondary | ICD-10-CM

## 2022-11-08 ENCOUNTER — Ambulatory Visit: Payer: Medicare HMO | Admitting: Cardiology

## 2022-11-14 ENCOUNTER — Other Ambulatory Visit: Payer: Self-pay | Admitting: Cardiology

## 2022-11-15 DIAGNOSIS — R69 Illness, unspecified: Secondary | ICD-10-CM | POA: Diagnosis not present

## 2022-11-15 DIAGNOSIS — E559 Vitamin D deficiency, unspecified: Secondary | ICD-10-CM | POA: Diagnosis not present

## 2022-11-15 DIAGNOSIS — E118 Type 2 diabetes mellitus with unspecified complications: Secondary | ICD-10-CM | POA: Diagnosis not present

## 2022-11-15 DIAGNOSIS — I25118 Atherosclerotic heart disease of native coronary artery with other forms of angina pectoris: Secondary | ICD-10-CM | POA: Diagnosis not present

## 2022-11-15 DIAGNOSIS — N184 Chronic kidney disease, stage 4 (severe): Secondary | ICD-10-CM | POA: Diagnosis not present

## 2022-11-15 DIAGNOSIS — E782 Mixed hyperlipidemia: Secondary | ICD-10-CM | POA: Diagnosis not present

## 2022-11-15 DIAGNOSIS — G894 Chronic pain syndrome: Secondary | ICD-10-CM | POA: Diagnosis not present

## 2022-11-15 DIAGNOSIS — Z23 Encounter for immunization: Secondary | ICD-10-CM | POA: Diagnosis not present

## 2022-11-15 DIAGNOSIS — J811 Chronic pulmonary edema: Secondary | ICD-10-CM | POA: Diagnosis not present

## 2022-11-15 DIAGNOSIS — I6930 Unspecified sequelae of cerebral infarction: Secondary | ICD-10-CM | POA: Diagnosis not present

## 2022-11-15 DIAGNOSIS — I10 Essential (primary) hypertension: Secondary | ICD-10-CM | POA: Diagnosis not present

## 2022-11-15 DIAGNOSIS — D6869 Other thrombophilia: Secondary | ICD-10-CM | POA: Diagnosis not present

## 2022-11-22 DIAGNOSIS — H524 Presbyopia: Secondary | ICD-10-CM | POA: Diagnosis not present

## 2022-11-22 DIAGNOSIS — H5213 Myopia, bilateral: Secondary | ICD-10-CM | POA: Diagnosis not present

## 2022-11-22 DIAGNOSIS — H52209 Unspecified astigmatism, unspecified eye: Secondary | ICD-10-CM | POA: Diagnosis not present

## 2022-11-22 DIAGNOSIS — H5203 Hypermetropia, bilateral: Secondary | ICD-10-CM | POA: Diagnosis not present

## 2022-12-19 ENCOUNTER — Ambulatory Visit: Payer: Self-pay

## 2022-12-19 ENCOUNTER — Encounter: Payer: Self-pay | Admitting: Orthopaedic Surgery

## 2022-12-19 ENCOUNTER — Ambulatory Visit: Payer: Medicare HMO | Admitting: Orthopaedic Surgery

## 2022-12-19 ENCOUNTER — Ambulatory Visit (INDEPENDENT_AMBULATORY_CARE_PROVIDER_SITE_OTHER): Payer: Medicare HMO

## 2022-12-19 VITALS — Wt 196.0 lb

## 2022-12-19 DIAGNOSIS — G8929 Other chronic pain: Secondary | ICD-10-CM

## 2022-12-19 DIAGNOSIS — M25562 Pain in left knee: Secondary | ICD-10-CM

## 2022-12-19 DIAGNOSIS — M25561 Pain in right knee: Secondary | ICD-10-CM | POA: Diagnosis not present

## 2022-12-19 MED ORDER — METHYLPREDNISOLONE ACETATE 40 MG/ML IJ SUSP
40.0000 mg | INTRAMUSCULAR | Status: AC | PRN
  Administered 2022-12-19: 40 mg via INTRA_ARTICULAR

## 2022-12-19 MED ORDER — LIDOCAINE HCL 1 % IJ SOLN
3.0000 mL | INTRAMUSCULAR | Status: AC | PRN
  Administered 2022-12-19: 3 mL

## 2022-12-19 NOTE — Progress Notes (Signed)
The patient is a 82 year old female with known well-documented severe arthritis of her right knee.  She comes in today and we did x-ray both knees again confirming the severity of her bone-on-bone arthritis of the right side.  She has moderate arthritis on the left side.  She is not a surgical candidate per her cardiology.  She has significant comorbidities.  Her daughter is with her as well.  We have provided steroid injections and hyaluronic acid in the past.  Hyaluronic acid has not really helped.  She is not diabetic.  She is interested in steroid injection in her right knee today.  On exam right knee has slight warmth and varus malalignment that is correctable.  There is a mild effusion.  I was able to aspirate about 5 to 10 cc of fluid off of her knee.  I then placed a sterile injection of the right knee without difficulty.  We can always do this again in 3 months.  All question concerns were answered and addressed.       Procedure Note  Patient: Kristin [name]             Date of Birth: 1941/10/26           MRN: [id number]             Visit Date: 12/19/2022  Procedures: Visit Diagnoses:  1. Chronic pain of right knee   2. Chronic pain of left knee     Large Joint Inj: R knee on 12/19/2022 4:05 PM Indications: diagnostic evaluation and pain Details: 22 G 1.5 in needle, superolateral approach  Arthrogram: No  Medications: 3 mL lidocaine 1 %; 40 mg methylPREDNISolone acetate 40 MG/ML Outcome: tolerated well, no immediate complications Procedure, treatment alternatives, risks and benefits explained, specific risks discussed. Consent was given by the patient. Immediately prior to procedure a time out was called to verify the correct patient, procedure, equipment, support staff and site/side marked as required. Patient was prepped and draped in the usual sterile fashion.

## 2022-12-27 DIAGNOSIS — D72819 Decreased white blood cell count, unspecified: Secondary | ICD-10-CM | POA: Diagnosis not present

## 2022-12-31 DIAGNOSIS — N184 Chronic kidney disease, stage 4 (severe): Secondary | ICD-10-CM | POA: Diagnosis not present

## 2023-01-09 ENCOUNTER — Other Ambulatory Visit: Payer: Self-pay | Admitting: Cardiology

## 2023-01-09 DIAGNOSIS — I2510 Atherosclerotic heart disease of native coronary artery without angina pectoris: Secondary | ICD-10-CM

## 2023-01-10 DIAGNOSIS — E1122 Type 2 diabetes mellitus with diabetic chronic kidney disease: Secondary | ICD-10-CM | POA: Diagnosis not present

## 2023-01-10 DIAGNOSIS — I639 Cerebral infarction, unspecified: Secondary | ICD-10-CM | POA: Diagnosis not present

## 2023-01-10 DIAGNOSIS — I5020 Unspecified systolic (congestive) heart failure: Secondary | ICD-10-CM | POA: Diagnosis not present

## 2023-01-10 DIAGNOSIS — I129 Hypertensive chronic kidney disease with stage 1 through stage 4 chronic kidney disease, or unspecified chronic kidney disease: Secondary | ICD-10-CM | POA: Diagnosis not present

## 2023-01-10 DIAGNOSIS — G894 Chronic pain syndrome: Secondary | ICD-10-CM | POA: Diagnosis not present

## 2023-01-10 DIAGNOSIS — N1832 Chronic kidney disease, stage 3b: Secondary | ICD-10-CM | POA: Diagnosis not present

## 2023-01-16 ENCOUNTER — Other Ambulatory Visit: Payer: Self-pay | Admitting: Cardiology

## 2023-01-16 DIAGNOSIS — I2510 Atherosclerotic heart disease of native coronary artery without angina pectoris: Secondary | ICD-10-CM

## 2023-02-06 ENCOUNTER — Other Ambulatory Visit: Payer: Self-pay | Admitting: Cardiology

## 2023-02-06 DIAGNOSIS — I5042 Chronic combined systolic (congestive) and diastolic (congestive) heart failure: Secondary | ICD-10-CM

## 2023-02-13 ENCOUNTER — Other Ambulatory Visit: Payer: Self-pay | Admitting: Cardiology

## 2023-02-13 DIAGNOSIS — I5042 Chronic combined systolic (congestive) and diastolic (congestive) heart failure: Secondary | ICD-10-CM

## 2023-02-13 DIAGNOSIS — E782 Mixed hyperlipidemia: Secondary | ICD-10-CM

## 2023-02-13 DIAGNOSIS — I2510 Atherosclerotic heart disease of native coronary artery without angina pectoris: Secondary | ICD-10-CM

## 2023-02-28 ENCOUNTER — Telehealth: Payer: Self-pay | Admitting: Orthopaedic Surgery

## 2023-02-28 NOTE — Telephone Encounter (Signed)
VOB submitted for Euflexxa, right knee  

## 2023-02-28 NOTE — Telephone Encounter (Signed)
Patient's daughter Rema Fendt called advised patient would like to have a gel injection in her right knee when she comes to her appointment 03/20/2023. The number to contact [place] is [phone]

## 2023-03-03 ENCOUNTER — Other Ambulatory Visit: Payer: Self-pay | Admitting: Cardiology

## 2023-03-03 DIAGNOSIS — I5032 Chronic diastolic (congestive) heart failure: Secondary | ICD-10-CM

## 2023-03-07 ENCOUNTER — Other Ambulatory Visit: Payer: Self-pay | Admitting: Cardiology

## 2023-03-07 DIAGNOSIS — I2510 Atherosclerotic heart disease of native coronary artery without angina pectoris: Secondary | ICD-10-CM

## 2023-03-11 ENCOUNTER — Telehealth: Payer: Self-pay

## 2023-03-11 NOTE — Telephone Encounter (Signed)
PA has been started through Euflexxa portal for euflexxa, right knee. PA pending.

## 2023-03-20 ENCOUNTER — Ambulatory Visit: Payer: Medicare HMO | Admitting: Orthopaedic Surgery

## 2023-03-20 ENCOUNTER — Other Ambulatory Visit: Payer: Self-pay

## 2023-03-20 ENCOUNTER — Encounter: Payer: Self-pay | Admitting: Orthopaedic Surgery

## 2023-03-20 DIAGNOSIS — M1711 Unilateral primary osteoarthritis, right knee: Secondary | ICD-10-CM | POA: Diagnosis not present

## 2023-03-20 DIAGNOSIS — M25561 Pain in right knee: Secondary | ICD-10-CM

## 2023-03-20 DIAGNOSIS — G8929 Other chronic pain: Secondary | ICD-10-CM

## 2023-03-20 MED ORDER — SODIUM HYALURONATE (VISCOSUP) 20 MG/2ML IX SOSY
20.0000 mg | PREFILLED_SYRINGE | INTRA_ARTICULAR | Status: AC | PRN
  Administered 2023-03-20: 20 mg via INTRA_ARTICULAR

## 2023-03-20 NOTE — Progress Notes (Signed)
   Procedure Note  Patient: Kristin [name]             Date of Birth: 10/31/1941           MRN: [id number]             Visit Date: 03/20/2023  Procedures: Visit Diagnoses:  1. Chronic pain of right knee   2. Unilateral primary osteoarthritis, right knee     Large Joint Inj: R knee on 03/20/2023 3:29 PM Indications: diagnostic evaluation and pain Details: 22 G 1.5 in needle, superolateral approach  Arthrogram: No  Medications: 20 mg Sodium Hyaluronate (Viscosup) 20 MG/2ML Outcome: tolerated well, no immediate complications Procedure, treatment alternatives, risks and benefits explained, specific risks discussed. Consent was given by the patient. Immediately prior to procedure a time out was called to verify the correct patient, procedure, equipment, support staff and site/side marked as required. Patient was prepped and draped in the usual sterile fashion.    The patient comes in today for injection #1 of a series of 3 Euflexxa injections in her right knee to treat the pain from osteoarthritis.  She is 82 years old and has failed other conservative treatment measures including steroid injections.  She has known arthritis in the right knee with varus malalignment.  On exam today her knee does have varus malalignment with no effusion.  I did place injection #1 of a series of 3 injections with Euflexxa in her right knee today without difficulty.  All questions and concerns were answered and addressed.  She tolerated the injection well.  Will see her back next week for injection #2.  Lot number [id number] Expiration 03/13/2024

## 2023-03-24 ENCOUNTER — Encounter: Payer: Self-pay | Admitting: Cardiology

## 2023-03-26 DIAGNOSIS — I2510 Atherosclerotic heart disease of native coronary artery without angina pectoris: Secondary | ICD-10-CM | POA: Diagnosis not present

## 2023-03-27 ENCOUNTER — Ambulatory Visit: Payer: Medicare HMO | Admitting: Orthopaedic Surgery

## 2023-03-27 ENCOUNTER — Encounter: Payer: Self-pay | Admitting: Orthopaedic Surgery

## 2023-03-27 DIAGNOSIS — M1711 Unilateral primary osteoarthritis, right knee: Secondary | ICD-10-CM | POA: Diagnosis not present

## 2023-03-27 LAB — CMP14+EGFR
ALT: 16 IU/L (ref 0–32)
AST: 22 IU/L (ref 0–40)
Albumin/Globulin Ratio: 1.7 (ref 1.2–2.2)
Albumin: 4 g/dL (ref 3.7–4.7)
Alkaline Phosphatase: 79 IU/L (ref 44–121)
BUN/Creatinine Ratio: 16 (ref 12–28)
BUN: 24 mg/dL (ref 8–27)
Bilirubin Total: 0.3 mg/dL (ref 0.0–1.2)
CO2: 26 mmol/L (ref 20–29)
Calcium: 9.6 mg/dL (ref 8.7–10.3)
Chloride: 106 mmol/L (ref 96–106)
Creatinine, Ser: 1.54 mg/dL — ABNORMAL HIGH (ref 0.57–1.00)
Globulin, Total: 2.4 g/dL (ref 1.5–4.5)
Glucose: 112 mg/dL — ABNORMAL HIGH (ref 70–99)
Potassium: 4.7 mmol/L (ref 3.5–5.2)
Sodium: 145 mmol/L — ABNORMAL HIGH (ref 134–144)
Total Protein: 6.4 g/dL (ref 6.0–8.5)
eGFR: 34 mL/min/{1.73_m2} — ABNORMAL LOW (ref >59)

## 2023-03-27 LAB — LDL CHOLESTEROL, DIRECT: LDL Direct: 51 mg/dL (ref 0–99)

## 2023-03-27 LAB — LIPID PANEL
Chol/HDL Ratio: 2.3 ratio (ref 0.0–4.4)
Cholesterol, Total: 124 mg/dL (ref 100–199)
HDL: 53 mg/dL (ref >39)
LDL Chol Calc (NIH): 56 mg/dL (ref 0–99)
Triglycerides: 72 mg/dL (ref 0–149)
VLDL Cholesterol Cal: 15 mg/dL (ref 5–40)

## 2023-03-27 MED ORDER — SODIUM HYALURONATE (VISCOSUP) 20 MG/2ML IX SOSY
20.0000 mg | PREFILLED_SYRINGE | INTRA_ARTICULAR | Status: AC | PRN
Start: 2023-03-27 — End: 2023-03-27
  Administered 2023-03-27: 20 mg via INTRA_ARTICULAR

## 2023-03-27 NOTE — Progress Notes (Signed)
   Procedure Note  Patient: Gayla Benn             Date of Birth: 11-Dec-1940           MRN: [id number]             Visit Date: 03/27/2023  Procedures: Visit Diagnoses:  1. Unilateral primary osteoarthritis, right knee     Large Joint Inj on 03/27/2023 3:29 PM Indications: diagnostic evaluation and pain Details: 22 G 1.5 in needle, superolateral approach  Arthrogram: No  Medications: 20 mg Sodium Hyaluronate (Viscosup) 20 MG/2ML Outcome: tolerated well, no immediate complications Procedure, treatment alternatives, risks and benefits explained, specific risks discussed. Consent was given by the patient. Immediately prior to procedure a time out was called to verify the correct patient, procedure, equipment, support staff and site/side marked as required. Patient was prepped and draped in the usual sterile fashion.    The patient is an 82 year old female is here today for injection #2 of a series of 3 hyaluronic acid injections with Euflexxa in the right knee to treat the pain from osteoarthritis.  She had no adverse reaction to the first injection.  There is no knee joint effusion.  I did place injection #2 of Euflexxa in her right knee today.  She will return next week for injection #3.

## 2023-04-03 ENCOUNTER — Ambulatory Visit: Payer: Medicare HMO | Admitting: Physician Assistant

## 2023-04-03 ENCOUNTER — Encounter: Payer: Self-pay | Admitting: Physician Assistant

## 2023-04-03 DIAGNOSIS — M1711 Unilateral primary osteoarthritis, right knee: Secondary | ICD-10-CM | POA: Diagnosis not present

## 2023-04-03 MED ORDER — SODIUM HYALURONATE (VISCOSUP) 20 MG/2ML IX SOSY
20.0000 mg | PREFILLED_SYRINGE | INTRA_ARTICULAR | Status: AC | PRN
Start: 2023-04-03 — End: 2023-04-03
  Administered 2023-04-03: 20 mg via INTRA_ARTICULAR

## 2023-04-03 NOTE — Progress Notes (Signed)
Office Visit Note   Patient: Kristin [name]           Date of Birth: 1941-09-14           MRN: [id number] Visit Date: 04/03/2023              Requested by: Aliene Beams, MD [id number] [place],  [place] [address] PCP: Aliene Beams, MD  Chief Complaint  Patient presents with  . Right Knee - Follow-up    Euflexxa      HPI: Inita is a pleasant 82 year old woman who comes in today for her third Euflexxa injection into her right knee.  She is a patient of Dr. Eliberto Ivory.  She thinks she has maybe gotten a little relief in her symptoms.  She has not had any reactions.  Assessment & Plan: Visit Diagnoses: Osteoarthritis right knee  Plan: Micah Flesher forward with her final injection without any difficulty may follow-up with Dr. Magnus Ivan as needed  Follow-Up Instructions: Return if symptoms worsen or fail to improve.   Ortho Exam  Patient is alert, oriented, no adenopathy, well-dressed, normal affect, normal respiratory effort. Examination of her right knee she has no effusion no erythema compartments are soft and nontender she has neurovascular intact  Imaging: No results found. No images are attached to the encounter.  Labs: Lab Results  Component Value Date   HGBA1C 6.1 (H) 05/11/2021   HGBA1C 6.7 (H) 08/27/2020   CRP 11.5 (H) 08/18/2020   REPTSTATUS 08/23/2020 FINAL 08/19/2020   GRAMSTAIN  08/19/2020    MODERATE WBC PRESENT, PREDOMINANTLY PMN NO ORGANISMS SEEN    CULT  08/19/2020    NO GROWTH 3 DAYS Performed at [facility], [address], [place], [place] [address]      Lab Results  Component Value Date   ALBUMIN 4.0 03/26/2023   ALBUMIN 4.0 10/04/2022   ALBUMIN 3.6 07/04/2022    Lab Results  Component Value Date   MG 2.4 (H) 08/16/2022   MG 2.2 04/19/2022   MG 2.0 10/22/2021   No results found for: "VD25OH"  No results found for: "PREALBUMIN"    Latest Ref Rng & Units 07/04/2022   10:30 AM 05/16/2021    3:32 AM 05/15/2021    3:15 AM   CBC EXTENDED  WBC 4.0 - 10.5 K/uL 5.1  6.3  6.1   RBC 3.87 - 5.11 MIL/uL 3.95  2.82  3.12   Hemoglobin 12.0 - 15.0 g/dL 82.9  8.7  9.4   HCT 56.2 - 46.0 % 39.0  27.1  29.6   Platelets 150 - 400 K/uL 219  158  171      There is no height or weight on file to calculate BMI.  Orders:  No orders of the defined types were placed in this encounter.  No orders of the defined types were placed in this encounter.    Procedures: Large Joint Inj on 04/03/2023 3:28 PM Indications: pain and diagnostic evaluation Details: 22 G 1.5 in needle, anteromedial approach  Arthrogram: No  Medications: 20 mg Sodium Hyaluronate (Viscosup) 20 MG/2ML Outcome: tolerated well, no immediate complications Procedure, treatment alternatives, risks and benefits explained, specific risks discussed. Consent was given by the patient.    Clinical Data: No additional findings.  ROS:  All other systems negative, except as noted in the HPI. Review of Systems  Objective: Vital Signs: There were no vitals taken for this visit.  Specialty Comments:  No specialty comments available.  PMFS History: Patient Active  Problem List   Diagnosis Date Noted  . Status post coronary artery stent placement   . CKD (chronic kidney disease) stage 4, GFR 15-29 ml/min 05/11/2021  . HFrEF (heart failure with reduced ejection fraction)   . Unilateral primary osteoarthritis, left knee 04/16/2021  . Unilateral primary osteoarthritis, right knee 10/03/2020  . Cerebral embolism with cerebral infarction 08/28/2020  . 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 AKI (acute kidney injury)   . Atherosclerosis of native coronary artery of native heart without angina pectoris   . Atrial fibrillation   . Mixed hyperlipidemia   . Pulmonary edema   . Acute HFrEF (heart failure with reduced ejection fraction)   . Cardiomyopathy   . Effusion of right knee joint   . SIRS (systemic inflammatory response  syndrome) 08/18/2020  . Lower extremity weakness 08/18/2020  . Hypertension   . Chronic low back pain with bilateral sciatica    Past Medical History:  Diagnosis Date  . Atrial fibrillation   . CHF (congestive heart failure)   . Coronary artery disease   . Diabetes mellitus without complication   . Hypercholesteremia   . Hypertension   . Renal disorder    CKD Stage IV  . Stroke     Family History  Problem Relation Age of Onset  . Diabetes Father   . Heart attack Father   . Heart disease Mother   . Hypertension Sister   . Hypertension Sister   . Hypertension Sister   . Hypertension Sister   . Hypertension Sister   . Hypertension Sister   . Hypertension Sister   . Hypertension Sister   . Hypertension Sister   . Hypertension Sister   . Hypertension Sister   . Hypertension Sister     Past Surgical History:  Procedure Laterality Date  . CORONARY STENT INTERVENTION N/A 05/15/2021   Procedure: CORONARY STENT INTERVENTION;  Surgeon: Elder Negus, MD;  Location: [facility];  Service: Cardiovascular;  Laterality: N/A;  . LEFT HEART CATH AND CORONARY ANGIOGRAPHY N/A 05/11/2021   Procedure: LEFT HEART CATH AND CORONARY ANGIOGRAPHY;  Surgeon: Elder Negus, MD;  Location: [facility];  Service: Cardiovascular;  Laterality: N/A;  . LUMBAR LAMINECTOMY/DECOMPRESSION MICRODISCECTOMY N/A 08/19/2020   Procedure: LUMBAR LAMINECTOMY/DECOMPRESSION  Lumbar two-three, Lumbar three-four;  Surgeon: Dawley, Alan Mulder, DO;  Location: [facility] OR;  Service: Neurosurgery;  Laterality: N/A;  . RIGHT/LEFT HEART CATH AND CORONARY ANGIOGRAPHY N/A 08/23/2020   Procedure: RIGHT/LEFT HEART CATH AND CORONARY ANGIOGRAPHY;  Surgeon: Yates Decamp, MD;  Location: [facility];  Service: Cardiovascular;  Laterality: N/A;   Social History   Occupational History  . Not on file  Tobacco Use  . Smoking status: Never  . Smokeless tobacco: Never  Vaping Use  . Vaping Use: Never used   Substance and Sexual Activity  . Alcohol use: Never  . Drug use: Never  . Sexual activity: Not on file

## 2023-04-04 ENCOUNTER — Ambulatory Visit: Payer: Medicare HMO | Admitting: Cardiology

## 2023-04-04 ENCOUNTER — Encounter: Payer: Self-pay | Admitting: Cardiology

## 2023-04-04 VITALS — BP 163/85 | HR 72 | Resp 16 | Ht 64.0 in | Wt 195.2 lb

## 2023-04-04 DIAGNOSIS — E1122 Type 2 diabetes mellitus with diabetic chronic kidney disease: Secondary | ICD-10-CM | POA: Diagnosis not present

## 2023-04-04 DIAGNOSIS — Z7901 Long term (current) use of anticoagulants: Secondary | ICD-10-CM | POA: Diagnosis not present

## 2023-04-04 DIAGNOSIS — I5042 Chronic combined systolic (congestive) and diastolic (congestive) heart failure: Secondary | ICD-10-CM

## 2023-04-04 DIAGNOSIS — I2510 Atherosclerotic heart disease of native coronary artery without angina pectoris: Secondary | ICD-10-CM

## 2023-04-04 DIAGNOSIS — I252 Old myocardial infarction: Secondary | ICD-10-CM | POA: Diagnosis not present

## 2023-04-04 DIAGNOSIS — E782 Mixed hyperlipidemia: Secondary | ICD-10-CM

## 2023-04-04 DIAGNOSIS — Z8673 Personal history of transient ischemic attack (TIA), and cerebral infarction without residual deficits: Secondary | ICD-10-CM

## 2023-04-04 DIAGNOSIS — I480 Paroxysmal atrial fibrillation: Secondary | ICD-10-CM | POA: Diagnosis not present

## 2023-04-04 DIAGNOSIS — N1832 Chronic kidney disease, stage 3b: Secondary | ICD-10-CM | POA: Diagnosis not present

## 2023-04-04 DIAGNOSIS — Z955 Presence of coronary angioplasty implant and graft: Secondary | ICD-10-CM

## 2023-04-04 NOTE — Progress Notes (Signed)
Kristin [name] Date of Birth: 1941-07-25 MRN: [id number] Primary Care Provider:Hagler, Fleet Contras, MD Primary Cardiologist: Tessa Lerner, DO, [facility] (established care 08/22/2020) Primary nephrologist: Dr. Louie Bun  Date: 04/04/23 Last Office Visit: 09/27/2022  Chief Complaint  Patient presents with   Coronary Artery Disease   Follow-up    6 months    HPI  Kristin [name] is a 82 y.o.  female whose past medical history and cardiovascular risk factors include: Paroxysmal atrial fibrillation, NSTEMI (08/2020), multivessel CAD, recovered ischemic cardiomyopathy, chronic HFpEF/stage C/NYHA class II, hx of cerebellar stroke, lumbar stenosis status post laminectomy, hypertension, CKD stage III/IV, diabetes miellitus type 2, postmenopausal female, advanced age.   Patient presents to the office accompanied by her daughter Adonis Huguenin and 2 sons.   Patient is being followed by the practice given her underlying CAD status post coronary intervention, history of ACS, and atrial fibrillation.  Patient presents today for 63-month follow-up visit.  She denies anginal chest pain or heart failure symptoms.  No use of sublingual nitroglycerin tablets.  Home blood pressures are well-controlled SBP between 120-140 mmHg.  Most recent labs from April 2024 independently reviewed.  Lipids are well-controlled and kidney function illustrates chronic kidney disease stage IIIb, significantly better compared to the past.   FUNCTIONAL STATUS: No structured exercise program or daily routine.   ALLERGIES: No Known Allergies  MEDICATION LIST PRIOR TO VISIT: Current Outpatient Medications on File Prior to Visit  Medication Sig Dispense Refill   apixaban (ELIQUIS) 2.5 MG TABS tablet Take 1 tablet by mouth twice daily 180 tablet 1   carvedilol (COREG) 3.125 MG tablet TAKE 1 TABLET BY MOUTH TWICE DAILY WITH A MEAL 180 tablet 0   cholecalciferol (VITAMIN D) 25 MCG (1000 UNIT) tablet Take 2,000 Units by mouth daily.      colchicine 0.6 MG tablet Take 0.5 tablets (0.3 mg total) by mouth daily. (Patient taking differently: Take 0.6 mg by mouth daily.) 15 tablet 0   dapagliflozin propanediol (FARXIGA) 10 MG TABS tablet TAKE 1 TABLET BY MOUTH ONCE DAILY BEFORE BREAKFAST 90 tablet 0   diclofenac Sodium (VOLTAREN) 1 % GEL Apply 2 g topically 4 (four) times daily.     ENTRESTO 49-51 MG Take 1 tablet by mouth twice daily 180 tablet 0   ezetimibe (ZETIA) 10 MG tablet Take 1 tablet by mouth once daily 90 tablet 0   hydrALAZINE (APRESOLINE) 100 MG tablet TAKE 1 TABLET BY MOUTH THREE TIMES DAILY 180 tablet 0   isosorbide mononitrate (IMDUR) 120 MG 24 hr tablet Take 1 tablet by mouth once daily 90 tablet 0   nitroGLYCERIN (NITROSTAT) 0.4 MG SL tablet Place 1 tablet (0.4 mg total) under the tongue every 5 (five) minutes as needed for up to 30 doses for chest pain. 30 tablet 0   pantoprazole (PROTONIX) 40 MG tablet Take 1 tablet (40 mg total) by mouth daily. 30 tablet 0   rosuvastatin (CRESTOR) 40 MG tablet TAKE 1 TABLET BY MOUTH AT BEDTIME 90 tablet 0   No current facility-administered medications on file prior to visit.    PAST MEDICAL HISTORY: Past Medical History:  Diagnosis Date   Atrial fibrillation (HCC)    CHF (congestive heart failure) (HCC)    Coronary artery disease    Diabetes mellitus without complication (HCC)    Hypercholesteremia    Hypertension    Renal disorder    CKD Stage IV   Stroke [facility])     PAST SURGICAL HISTORY: Past Surgical History:  Procedure Laterality Date  CORONARY STENT INTERVENTION N/A 05/15/2021   Procedure: CORONARY STENT INTERVENTION;  Surgeon: Elder Negus, MD;  Location: [facility] INVASIVE CV LAB;  Service: Cardiovascular;  Laterality: N/A;   LEFT HEART CATH AND CORONARY ANGIOGRAPHY N/A 05/11/2021   Procedure: LEFT HEART CATH AND CORONARY ANGIOGRAPHY;  Surgeon: Elder Negus, MD;  Location: [facility] INVASIVE CV LAB;  Service: Cardiovascular;  Laterality: N/A;   LUMBAR  LAMINECTOMY/DECOMPRESSION MICRODISCECTOMY N/A 08/19/2020   Procedure: LUMBAR LAMINECTOMY/DECOMPRESSION  Lumbar two-three, Lumbar three-four;  Surgeon: Dawley, Alan Mulder, DO;  Location: [facility] OR;  Service: Neurosurgery;  Laterality: N/A;   RIGHT/LEFT HEART CATH AND CORONARY ANGIOGRAPHY N/A 08/23/2020   Procedure: RIGHT/LEFT HEART CATH AND CORONARY ANGIOGRAPHY;  Surgeon: Yates Decamp, MD;  Location: [facility] INVASIVE CV LAB;  Service: Cardiovascular;  Laterality: N/A;    FAMILY HISTORY: The patient's family history includes Diabetes in her father; Heart attack in her father; Heart disease in her mother; Hypertension in her sister, sister, sister, sister, sister, sister, sister, sister, sister, sister, sister, and sister.   SOCIAL HISTORY:  The patient  reports that she has never smoked. She has never used smokeless tobacco. She reports that she does not drink alcohol and does not use drugs.  Review of Systems  Constitutional: Negative for chills and fever.  HENT:  Negative for hoarse voice and nosebleeds.        Difficultly hearing   Eyes:  Negative for blurred vision, discharge, double vision and pain.  Cardiovascular:  Positive for dyspnea on exertion (chronic and stable). Negative for chest pain, claudication, leg swelling, near-syncope, orthopnea, palpitations, paroxysmal nocturnal dyspnea and syncope.  Respiratory:  Negative for hemoptysis and shortness of breath.   Musculoskeletal:  Positive for back pain and joint pain. Negative for muscle cramps and myalgias.       Unsteady with gait, but no falls.   Gastrointestinal:  Negative for abdominal pain, constipation, diarrhea, hematemesis, hematochezia, melena, nausea and vomiting.  Neurological:  Positive for paresthesias (hands and legs). Negative for dizziness and light-headedness.    PHYSICAL EXAM:    04/04/2023   11:30 AM 12/19/2022    3:26 PM 09/27/2022    1:26 PM  Vitals with BMI  Height 5\' 4"   5\' 4"   Weight 195 lbs 3 oz 196 lbs 192 lbs 6 oz   BMI 33.49  33.01  Systolic 163  138  Diastolic 85  82  Pulse 72  85   CONSTITUTIONAL: Age-appropriate female, hemodynamically stable, no acute distress.  SKIN: Skin is warm and dry. No rash noted. No cyanosis. No pallor. No jaundice HEAD: Normocephalic and atraumatic.  EYES: No scleral icterus MOUTH/THROAT: Moist oral membranes.  NECK: No JVD present. No thyromegaly noted. No carotid bruits  CHEST Normal respiratory effort. No intercostal retractions  LUNGS: Clear to auscultation bilaterally.  No stridor. No wheezes. No rales.  CARDIOVASCULAR: Regular rate and rhythm, positive S1-S2, soft holosystolic murmur heard at the apex, no rubs or gallops appreciated ABDOMINAL: Obese, soft, nontender, nondistended, positive bowel sounds in all 4 quadrants, no apparent ascites.  EXTREMITIES: No peripheral edema, warm to touch, 2+ DP and PT pulses. HEMATOLOGIC: No significant bruising NEUROLOGIC: Oriented to person, place, and time. Nonfocal. Normal muscle tone.  PSYCHIATRIC: Normal mood and affect. Normal behavior. Cooperative  CARDIAC DATABASE: EKG: April 04, 2023: Sinus rhythm, 60 bpm, poor R wave progression, consider anterior infarct, nonspecific ST-T changes, without underlying injury pattern.  Echocardiogram: 12/15/2020 1. The distal inferoseptum and apex appear mildly hypokinetic, but WMA is much improved  compared with prior study. LVEF has improved considerably. Left ventricular ejection fraction, by estimation, is 55 to 60%. Left ventricular ejection fraction by 3D volume is 56%. The left ventricle has normal function. The left ventricle demonstrates regional wall motion abnormalities (see scoring diagram/findings for description). Left ventricular diastolic parameters are consistent with Grade I diastolic dysfunction (impaired relaxation). 2. Right ventricular systolic function is normal. The right ventricular size is normal. There is normal pulmonary artery systolic pressure. The estimated  right ventricular systolic pressure is 30.7 mmHg. 3. The mitral valve is grossly normal. Trivial mitral valve regurgitation. No evidence of mitral stenosis. 4. The aortic valve is tricuspid. There is mild calcification of the aortic valve. There is mild thickening of the aortic valve. Aortic valve regurgitation is not visualized. Mild aortic valve sclerosis is present, with no evidence of aortic valve stenosis. 5. The inferior vena cava is normal in size with greater than 50% respiratory variability, suggesting right atrial pressure of 3 mmHg.  07/06/2021:  Left ventricle cavity is normal in size. Mild concentric hypertrophy of the left ventricle. Mid to distal anteroseptal/anterior mild hypokinesis. Mild global hypokinesis. LVEF 40-45%. Doppler evidence of grade II (pseudonormal) diastolic dysfunction, elevated LAP.  Left atrial cavity is moderately dilated.  Moderate (Grade II) mitral regurgitation. Mildly restricted mitral valve leaflets.  Moderate tricuspid regurgitation. Moderate pulmonary hypertension.  Estimated pulmonary artery systolic pressure 59 mmHg.  Mild pulmonic regurgitation.  Previous study on 05/11/2021 noted akinetic mid-apical anterior wall, anteroseptal wall, and apex. LVEF 45-50%, no pulmonary hypertension.   Stress Testing:  No results found for this or any previous visit from the past 1095 days.  Heart Catheterization: 05/11/2021 LM: Normal LAD: Prox 60% stenosis        Mid 95% stenosis just prox to large bifurcating diag        Diag ostium does not seem to be involved.        (Prior cath in 08/2020 noted prox 80% stenosis, followed by long mid to apical LAD occlusion, which seems to have recanalized) LCx: No significant disease RCA: Ostial 90% stenosis         (Significant dampening of pressure noted)         RPL 60% stenosis RCA and LCx system seems largely unchanged compared to 08/2020   Two vessel CAD (Prox-mid LAD and ostial RCA) Elevated LVEDP 29 mmHg   Given  acute HFrEF, recommend staged revascularization. Options include CABG (Grafts to LAD, Diag, and RCA) vs PCI to LAD and RCA. Recommend medical management of heart failure over the weekend, followed by revascularization.  05/15/2021: Prox-mid LAD 60-99% stenosis   Successful percutaneous coronary intervention mid-prox LAD PTCA and stent placement 3.5 X 32 mm Synergy drug-eluting stent Proximal post dilatation using 4.0X15 mm balloon at 22 atm   99-->0% residual stenosis TIMI flow I-->III   Patient is also on eliquis for Afib Recommend Aspirin, plavix, and eliquis for 1 month After that, discontinue Aspirin and continue plavix and eliquis for at least 1 year After 1 year, could consider stopping plavix   Recommend symptoms guided management for ostial RCA stenosis Continue GDMT for HFrEF  LABORATORY DATA: Hemoglobin A1c   2022-05-10    eAG 111      Hgb A1c 5.5   4.8-5.6  CBC with Diff   2022-05-10    BA# 0.0   0.0-0.1  BA% 0.9   0.0-1.0  EO# 0.2   0.0-0.6  EO% 3.2   0.0-7.8  HCT 37.8   37.0-47.0  HGB 12.6   12.0-16.0  LY# 2.10   1.10-2.70  LY% 39.4   16.8-43.5  MCH 31.3   27.0-33.0  MCHC 33.3   32.0-36.0  MCV 93.9   81.0-99.0  MO# 0.7   0.3-0.8  MO% 13.5   4.6-12.4  MPV 10.1   7.5-10.7  NE# 2.3   1.9-7.2  NE% 43.0   43.3-71.9  NRBC# 0.00      NRBC% 0.10      PLT 206   150-400  RBC 4.03   4.20-5.40  RDW 13.8   11.5-15.5  WBC 5.4   4.0-11.0  Comp Metabolic Panel   1610-96-04    Albumin 4.2   3.4-4.8  ALP 77   38-126  ALT 16   0-52  Anion Gap 10.9   6.0-20.0  AST 24   0-39  BUN 28   6-26  CO2 33   22-32  CA-corrected 9.57   8.60-10.30  Calcium 9.8   8.6-10.3  Chloride 103   98-107  Creatinine 1.45   0.60-1.30  [id number] 36   >60  Glucose 91   70-99  Potassium 4.6   3.5-5.5  Sodium 142   136-145  Protein, Total 7.0   6.0-8.3  TBIL 0.7   0.3-1.0  Microalbumin Panel   2022-05-10    MA/CR ratio <7.5   0.0-30.0  UCR 93      UMA <0.7      Lipid Panel w/reflex    2022-05-10    LDL Chol Calc (NIH) 107   5-40  CHOL/HDL 3.3   2.0-4.0  Cholesterol 181   <200  HDLD 55   30-85  LDL Chol Calc (NIH) 107   9-81  NHDL 126   0-129  Triglyceride 107   0-199      Latest Ref Rng & Units 07/04/2022   10:30 AM 05/16/2021    3:32 AM 05/15/2021    3:15 AM  CBC  WBC 4.0 - 10.5 K/uL 5.1  6.3  6.1   Hemoglobin 12.0 - 15.0 g/dL 19.1  8.7  9.4   Hematocrit 36.0 - 46.0 % 39.0  27.1  29.6   Platelets 150 - 400 K/uL 219  158  171        Latest Ref Rng & Units 03/26/2023    2:32 PM 10/04/2022    8:36 AM 08/16/2022    9:09 AM  CMP  Glucose 70 - 99 mg/dL 478  295  621   BUN 8 - 27 mg/dL 24  22  20    Creatinine 0.57 - 1.00 mg/dL 3.08  6.57  8.46   Sodium 134 - 144 mmol/L 145  140  143   Potassium 3.5 - 5.2 mmol/L 4.7  4.6  4.3   Chloride 96 - 106 mmol/L 106  101  103   CO2 20 - 29 mmol/L 26  27  26    Calcium 8.7 - 10.3 mg/dL 9.6  9.7  9.8   Total Protein 6.0 - 8.5 g/dL 6.4  6.7    Total Bilirubin 0.0 - 1.2 mg/dL 0.3  0.4    Alkaline Phos 44 - 121 IU/L 79  82    AST 0 - 40 IU/L 22  24    ALT 0 - 32 IU/L 16  14      Recent Labs    04/19/22 0932 08/16/22 0909  PROBNP 696 785*    BMP Recent Labs    07/04/22 1030 08/16/22 0909 10/04/22 0836 03/26/23 1432  NA  141 143 140 145*  K 5.0 4.3 4.6 4.7  CL 104 103 101 106  CO2 29 26 27 26   GLUCOSE 113* 104* 101* 112*  BUN 25* 20 22 24   CREATININE 1.50* 1.47* 1.50* 1.54*  CALCIUM 9.1 9.8 9.7 9.6  GFRNONAA 35*  --   --   --     HEMOGLOBIN A1C Lab Results  Component Value Date   HGBA1C 6.1 (H) 05/11/2021   MPG 128 05/11/2021   Lab Results  Component Value Date   CHOL 124 03/26/2023   HDL 53 03/26/2023   LDLCALC 56 03/26/2023   LDLDIRECT 51 03/26/2023   TRIG 72 03/26/2023   CHOLHDL 2.3 03/26/2023   IMPRESSION:    ICD-10-CM   1. Atherosclerosis of native coronary artery of native heart without angina pectoris  I25.10 EKG 12-Lead    2. History of coronary angioplasty with insertion of stent   Z95.5     3. Hx of non-ST elevation myocardial infarction (NSTEMI)  I25.2     4. Chronic combined systolic and diastolic congestive heart failure (HCC)  I50.42     5. Paroxysmal atrial fibrillation (HCC)  I48.0     6. Long term (current) use of anticoagulants  Z79.01     7. Mixed hyperlipidemia  E78.2     8. Hx of stroke  Z86.73     9. Type 2 diabetes mellitus with stage 3b chronic kidney disease, without long-term current use of insulin [facility])  E11.22    N18.32        RECOMMENDATIONS: Toneka Fullen is a 82 y.o. female whose past medical history and cardiovascular risk factors include: Paroxysmal atrial fibrillation, NSTEMI (08/2020), multivessel CAD, recovered ischemic cardiomyopathy, chronic HFpEF/stage C/NYHA class II, hx of cerebellar stroke, lumbar stenosis status post laminectomy, hypertension, CKD stage IV, diabetes miellitus type 2, postmenopausal female, advanced age.   Atherosclerosis of native coronary artery of native heart without angina pectoris History of coronary angioplasty with insertion of stent History of NSTEMI EKG: Nonischemic. Denies angina pectoris or heart failure symptoms No use of sublingual nitroglycerin tablets since the last office visit. Fasting lipid profile notes LDL level <55 mg/dL. Reemphasized importance of glycemic control. Home blood pressures are well-controlled. Underwent PCI to the proximal/mid LAD on May 15, 2021. Did not tolerate Toprol-XL in the past-claims that it causes her chest pain. Reemphasized the importance of improving her modifiable cardiovascular risk factors.  Chronic combined systolic and diastolic congestive heart failure (HCC) Euvolemic. Stage C, NYHA class II Medications reconciled. Home blood pressures are well-controlled. However, if additional medication is warranted would recommend up titration of Entresto to 97/103 mg p.o. twice daily. Samples for [place] provided. Patient assistance form for [name] provided  as well I had referred her to sleep medicine for evaluation of sleep apnea-pending.    Paroxysmal atrial fibrillation (HCC) Rate control: Carvedilol. Rhythm control: N/A. Thromboembolic prophylaxis: Eliquis  Long term (current) use of anticoagulants Indication paroxysmal atrial fibrillation. Does not endorse evidence of bleeding. CHA2DS2-VASc SCORE is 9 which correlates to 15.2 % risk of stroke per year (CHF, hypertension, age greater than 75, diabetes, stroke, history of non-STEMI, female). Re emphasized the risks, benefits, and alternatives to oral anticoagulation.  Mixed hyperlipidemia Currently on Crestor  She denies myalgia or other side effects. Most recent lipid profile notes LDL <55 mg/dL.  Hx of stroke Educated the importance of secondary prevention.  Type 2 diabetes mellitus with stage 3b chronic kidney disease, without long-term current use of insulin (HCC) Hemoglobin  A1c well controlled. Currently managed by primary care provider.  FINAL MEDICATION LIST END OF ENCOUNTER: No orders of the defined types were placed in this encounter.     Current Outpatient Medications:    apixaban (ELIQUIS) 2.5 MG TABS tablet, Take 1 tablet by mouth twice daily, Disp: 180 tablet, Rfl: 1   carvedilol (COREG) 3.125 MG tablet, TAKE 1 TABLET BY MOUTH TWICE DAILY WITH A MEAL, Disp: 180 tablet, Rfl: 0   cholecalciferol (VITAMIN D) 25 MCG (1000 UNIT) tablet, Take 2,000 Units by mouth daily., Disp: , Rfl:    colchicine 0.6 MG tablet, Take 0.5 tablets (0.3 mg total) by mouth daily. (Patient taking differently: Take 0.6 mg by mouth daily.), Disp: 15 tablet, Rfl: 0   dapagliflozin propanediol (FARXIGA) 10 MG TABS tablet, TAKE 1 TABLET BY MOUTH ONCE DAILY BEFORE BREAKFAST, Disp: 90 tablet, Rfl: 0   diclofenac Sodium (VOLTAREN) 1 % GEL, Apply 2 g topically 4 (four) times daily., Disp: , Rfl:    ENTRESTO 49-51 MG, Take 1 tablet by mouth twice daily, Disp: 180 tablet, Rfl: 0   ezetimibe (ZETIA) 10  MG tablet, Take 1 tablet by mouth once daily, Disp: 90 tablet, Rfl: 0   hydrALAZINE (APRESOLINE) 100 MG tablet, TAKE 1 TABLET BY MOUTH THREE TIMES DAILY, Disp: 180 tablet, Rfl: 0   isosorbide mononitrate (IMDUR) 120 MG 24 hr tablet, Take 1 tablet by mouth once daily, Disp: 90 tablet, Rfl: 0   nitroGLYCERIN (NITROSTAT) 0.4 MG SL tablet, Place 1 tablet (0.4 mg total) under the tongue every 5 (five) minutes as needed for up to 30 doses for chest pain., Disp: 30 tablet, Rfl: 0   pantoprazole (PROTONIX) 40 MG tablet, Take 1 tablet (40 mg total) by mouth daily., Disp: 30 tablet, Rfl: 0   rosuvastatin (CRESTOR) 40 MG tablet, TAKE 1 TABLET BY MOUTH AT BEDTIME, Disp: 90 tablet, Rfl: 0  Orders Placed This Encounter  Procedures   EKG 12-Lead   --Continue cardiac medications as reconciled in final medication list. --Return in about 6 months (around 10/04/2023) for Follow up, CAD, heart failure management.. Or sooner if needed. --Continue follow-up with your primary care physician regarding the management of your other chronic comorbid conditions.  Patient's questions and concerns were addressed to her satisfaction. She voices understanding of the instructions provided during this encounter.   This note was created using a voice recognition software as a result there may be grammatical errors inadvertently enclosed that do not reflect the nature of this encounter. Every attempt is made to correct such errors.  Tessa Lerner, [place], [facility]  Pager:  [phone] Office: [phone]

## 2023-04-06 ENCOUNTER — Other Ambulatory Visit: Payer: Self-pay | Admitting: Cardiology

## 2023-04-06 DIAGNOSIS — I2510 Atherosclerotic heart disease of native coronary artery without angina pectoris: Secondary | ICD-10-CM

## 2023-04-14 ENCOUNTER — Other Ambulatory Visit: Payer: Self-pay | Admitting: Cardiology

## 2023-04-14 DIAGNOSIS — I2510 Atherosclerotic heart disease of native coronary artery without angina pectoris: Secondary | ICD-10-CM

## 2023-04-20 ENCOUNTER — Emergency Department (HOSPITAL_COMMUNITY)
Admission: EM | Admit: 2023-04-20 | Discharge: 2023-04-21 | Disposition: A | Discharge status: Home / Self Care | Payer: Medicare HMO | Attending: Emergency Medicine | Admitting: Emergency Medicine

## 2023-04-20 ENCOUNTER — Other Ambulatory Visit: Payer: Self-pay

## 2023-04-20 DIAGNOSIS — Z955 Presence of coronary angioplasty implant and graft: Secondary | ICD-10-CM | POA: Insufficient documentation

## 2023-04-20 DIAGNOSIS — Z7901 Long term (current) use of anticoagulants: Secondary | ICD-10-CM | POA: Insufficient documentation

## 2023-04-20 DIAGNOSIS — N184 Chronic kidney disease, stage 4 (severe): Secondary | ICD-10-CM | POA: Diagnosis not present

## 2023-04-20 DIAGNOSIS — I509 Heart failure, unspecified: Secondary | ICD-10-CM | POA: Diagnosis not present

## 2023-04-20 DIAGNOSIS — R531 Weakness: Secondary | ICD-10-CM | POA: Diagnosis not present

## 2023-04-20 DIAGNOSIS — R5383 Other fatigue: Secondary | ICD-10-CM | POA: Diagnosis not present

## 2023-04-20 DIAGNOSIS — E119 Type 2 diabetes mellitus without complications: Secondary | ICD-10-CM | POA: Insufficient documentation

## 2023-04-20 DIAGNOSIS — Z7984 Long term (current) use of oral hypoglycemic drugs: Secondary | ICD-10-CM | POA: Insufficient documentation

## 2023-04-20 DIAGNOSIS — R062 Wheezing: Secondary | ICD-10-CM | POA: Diagnosis not present

## 2023-04-20 DIAGNOSIS — I2510 Atherosclerotic heart disease of native coronary artery without angina pectoris: Secondary | ICD-10-CM | POA: Insufficient documentation

## 2023-04-20 DIAGNOSIS — I130 Hypertensive heart and chronic kidney disease with heart failure and stage 1 through stage 4 chronic kidney disease, or unspecified chronic kidney disease: Secondary | ICD-10-CM | POA: Insufficient documentation

## 2023-04-20 DIAGNOSIS — Z79899 Other long term (current) drug therapy: Secondary | ICD-10-CM | POA: Diagnosis not present

## 2023-04-20 DIAGNOSIS — I10 Essential (primary) hypertension: Secondary | ICD-10-CM | POA: Diagnosis not present

## 2023-04-20 DIAGNOSIS — Z743 Need for continuous supervision: Secondary | ICD-10-CM | POA: Diagnosis not present

## 2023-04-20 NOTE — ED Triage Notes (Signed)
Patient reports feeling tired with fatigue and mild SOB this evening .

## 2023-04-21 ENCOUNTER — Emergency Department (HOSPITAL_COMMUNITY): Payer: Medicare HMO

## 2023-04-21 DIAGNOSIS — R5383 Other fatigue: Secondary | ICD-10-CM | POA: Diagnosis not present

## 2023-04-21 LAB — TROPONIN I (HIGH SENSITIVITY)
Troponin I (High Sensitivity): 9 ng/L (ref <18)
Troponin I (High Sensitivity): 9 ng/L (ref <18)

## 2023-04-21 LAB — CBC
HCT: 39.2 % (ref 36.0–46.0)
Hemoglobin: 12.4 g/dL (ref 12.0–15.0)
MCH: 31.2 pg (ref 26.0–34.0)
MCHC: 31.6 g/dL (ref 30.0–36.0)
MCV: 98.7 fL (ref 80.0–100.0)
Platelets: 200 10*3/uL (ref 150–400)
RBC: 3.97 MIL/uL (ref 3.87–5.11)
RDW: 12.2 % (ref 11.5–15.5)
WBC: 5.1 10*3/uL (ref 4.0–10.5)
nRBC: 0 % (ref 0.0–0.2)

## 2023-04-21 LAB — BASIC METABOLIC PANEL
Anion gap: 9 (ref 5–15)
BUN: 21 mg/dL (ref 8–23)
CO2: 28 mmol/L (ref 22–32)
Calcium: 9.4 mg/dL (ref 8.9–10.3)
Chloride: 101 mmol/L (ref 98–111)
Creatinine, Ser: 1.5 mg/dL — ABNORMAL HIGH (ref 0.44–1.00)
GFR, Estimated: 35 mL/min — ABNORMAL LOW (ref >60)
Glucose, Bld: 113 mg/dL — ABNORMAL HIGH (ref 70–99)
Potassium: 4.2 mmol/L (ref 3.5–5.1)
Sodium: 138 mmol/L (ref 135–145)

## 2023-04-21 LAB — PROTIME-INR
INR: 1 (ref 0.8–1.2)
Prothrombin Time: 13.5 seconds (ref 11.4–15.2)

## 2023-04-21 NOTE — Discharge Instructions (Addendum)
Your lab work and imaging today does is stable and does not show any acute abnormalities. Be sure to get lots of rest over the next couple days and stay hydrated. Take your home medications as prescribed and follow up with your PCP in the next few days for a recheck  It was a pleasure caring for you today in the emergency department.  Please return to the emergency department for any worsening or worrisome symptoms.

## 2023-04-21 NOTE — ED Notes (Signed)
Pt arrived to room via wc from lobby c/o sob and fatigue that began today. Pt a/o x 4 respirations even and non labored labs and xr wnl vs wnl Pt pending repeat labs and re evaluation

## 2023-04-21 NOTE — ED Provider Notes (Signed)
[facility] AT [facility] Provider Note  CSN: [id number] Arrival date & time: 04/20/23 2338  Chief Complaint(s) Fatigue  HPI Kristin [name] is a 82 y.o. female with past medical history as below, significant for afib, CAD, DM, CKD4, CVA, HFrEF who presents to the ED with complaint of fatigue.  Patient reports over the past 4 hours she has been feeling tired, fatigue.  Generalized weakness.  No chest pain or dyspnea, no focal weakness, no numbness or tingling.  No gait disturbance.  No recent diet or medication changes.  No falls or head injuries.  No behavior disturbance.  No vision or hearing changes, no fevers or chills.  No chest pain or dyspnea.  No nausea or vomiting or change in bowel or bladder function.  Medications prior to arrival.  Normal state of health prior to onset of symptoms  Past Medical History Past Medical History:  Diagnosis Date   Atrial fibrillation (HCC)    CHF (congestive heart failure) (HCC)    Coronary artery disease    Diabetes mellitus without complication (HCC)    Hypercholesteremia    Hypertension    Renal disorder    CKD Stage IV   Stroke [facility])    Patient Active Problem List   Diagnosis Date Noted   Status post coronary artery stent placement    CKD (chronic kidney disease) stage 4, GFR 15-29 ml/min (HCC) 05/11/2021   HFrEF (heart failure with reduced ejection fraction) (HCC)    Unilateral primary osteoarthritis, left knee 04/16/2021   Unilateral primary osteoarthritis, right knee 10/03/2020   Cerebral embolism with cerebral infarction 08/28/2020   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 Authorizing Provider  apixaban (ELIQUIS) 2.5 MG TABS tablet Take 1 tablet by mouth twice daily 11/04/22   Tolia, Sunit, DO  carvedilol (COREG) 3.125 MG tablet TAKE 1 TABLET BY MOUTH TWICE DAILY WITH A MEAL 04/14/23   Tolia, Sunit, DO  cholecalciferol (VITAMIN D) 25 MCG (1000 UNIT) tablet Take 2,000 Units by mouth daily.    [provider]  colchicine 0.6 MG tablet Take 0.5 tablets (0.3 mg total) by mouth daily. Patient taking differently: Take 0.6 mg by mouth daily. 08/31/20   Glade Lloyd, MD  dapagliflozin propanediol (FARXIGA) 10 MG TABS tablet TAKE 1 TABLET BY MOUTH ONCE DAILY BEFORE BREAKFAST 03/03/23   Tolia, Sunit, DO  diclofenac Sodium (VOLTAREN) 1 % GEL Apply 2 g topically 4 (four) times daily. 08/30/20   Glade Lloyd, MD  ENTRESTO 49-51 MG Take 1 tablet by mouth twice daily 02/06/23   Odis Hollingshead, Sunit, DO  ezetimibe (ZETIA) 10 MG tablet Take 1 tablet by mouth once daily 02/13/23   Tolia, Sunit, DO  hydrALAZINE (APRESOLINE) 100 MG tablet TAKE 1 TABLET BY MOUTH THREE TIMES DAILY 03/10/23   Tolia, Sunit, DO  isosorbide  mononitrate (IMDUR) 120 MG 24 hr tablet Take 1 tablet by mouth once daily 04/07/23   Tolia, Sunit, DO  nitroGLYCERIN (NITROSTAT) 0.4 MG SL tablet Place 1 tablet (0.4 mg total) under the tongue every 5 (five) minutes as needed for up to 30 doses for chest pain. 08/02/22   Tolia, Sunit, DO  pantoprazole (PROTONIX) 40 MG tablet Take 1 tablet (40 mg total) by mouth daily. 08/30/20   Glade Lloyd, MD  rosuvastatin (CRESTOR) 40 MG tablet TAKE 1 TABLET BY MOUTH AT BEDTIME 02/13/23   Tessa Lerner, DO                                                                                                                                     Past Surgical History Past Surgical History:  Procedure Laterality Date   CORONARY STENT INTERVENTION N/A 05/15/2021   Procedure: CORONARY STENT INTERVENTION;  Surgeon: Elder Negus, MD;  Location: [facility] INVASIVE CV LAB;  Service: Cardiovascular;  Laterality: N/A;   LEFT HEART CATH AND CORONARY ANGIOGRAPHY N/A 05/11/2021   Procedure: LEFT HEART CATH AND CORONARY ANGIOGRAPHY;  Surgeon: Elder Negus, MD;  Location: [facility] INVASIVE CV LAB;  Service: Cardiovascular;  Laterality: N/A;   LUMBAR LAMINECTOMY/DECOMPRESSION MICRODISCECTOMY N/A 08/19/2020   Procedure: LUMBAR LAMINECTOMY/DECOMPRESSION  Lumbar two-three, Lumbar three-four;  Surgeon: Dawley, Alan Mulder, DO;  Location: [facility] OR;  Service: Neurosurgery;  Laterality: N/A;   RIGHT/LEFT HEART CATH AND CORONARY ANGIOGRAPHY N/A 08/23/2020   Procedure: RIGHT/LEFT HEART CATH AND CORONARY ANGIOGRAPHY;  Surgeon: Yates Decamp, MD;  Location: [facility] INVASIVE CV LAB;  Service: Cardiovascular;  Laterality: N/A;   Family History Family History  Problem Relation Age of Onset   Diabetes Father    Heart attack Father    Heart disease Mother    Hypertension Sister    Hypertension Sister    Hypertension Sister    Hypertension Sister    Hypertension Sister    Hypertension Sister    Hypertension Sister    Hypertension Sister    Hypertension Sister    Hypertension Sister    Hypertension Sister    Hypertension Sister     Social History Social History   Tobacco Use   Smoking status: Never   Smokeless tobacco: Never  Vaping Use   Vaping Use: Never used  Substance Use Topics   Alcohol use: Never   Drug use: Never   Allergies Patient has no known allergies.  Review of Systems Review of Systems  Constitutional:  Positive for fatigue. Negative for activity change and fever.  HENT:  Negative for facial swelling and trouble swallowing.   Eyes:  Negative for discharge and redness.  Respiratory:  Negative for cough and shortness  of breath.   Cardiovascular:  Negative for chest pain and palpitations.  Gastrointestinal:  Negative for abdominal pain and nausea.  Genitourinary:  Negative for dysuria and flank pain.  Musculoskeletal:  Negative for back pain  and gait problem.  Skin:  Negative for pallor and rash.  Neurological:  Negative for syncope and headaches.    Physical Exam Vital Signs  I have reviewed the triage vital signs BP (!) 161/99   Pulse 81   Temp 98.3 F (36.8 C)   Resp 18   SpO2 100%  Physical Exam Vitals and nursing note reviewed.  Constitutional:      General: She is not in acute distress.    Appearance: Normal appearance. She is not ill-appearing.  HENT:     Head: Normocephalic and atraumatic.     Right Ear: External ear normal.     Left Ear: External ear normal.     Nose: Nose normal.     Mouth/Throat:     Mouth: Mucous membranes are moist.  Eyes:     General: No scleral icterus.       Right eye: No discharge.        Left eye: No discharge.     Extraocular Movements: Extraocular movements intact.     Pupils: Pupils are equal, round, and reactive to light.  Cardiovascular:     Rate and Rhythm: Normal rate and regular rhythm.     Pulses: Normal pulses.     Heart sounds: Normal heart sounds.  Pulmonary:     Effort: Pulmonary effort is normal. No respiratory distress.     Breath sounds: Normal breath sounds.  Abdominal:     General: Abdomen is flat.     Tenderness: There is no abdominal tenderness.  Musculoskeletal:        General: Normal range of motion.     Cervical back: No rigidity.     Right lower leg: No edema.     Left lower leg: No edema.  Skin:    General: Skin is warm and dry.     Capillary Refill: Capillary refill takes less than 2 seconds.  Neurological:     Mental Status: She is alert and oriented to person, place, and time.     GCS: GCS eye subscore is 4. GCS verbal subscore is 5. GCS motor subscore is 6.     Cranial Nerves: Cranial nerves 2-12 are intact. No  dysarthria or facial asymmetry.     Sensory: Sensation is intact.     Motor: Motor function is intact. No tremor.     Coordination: Coordination is intact.     Comments: Strength 5/5 bilateral upper and lower extremities   Psychiatric:        Mood and Affect: Mood normal.        Behavior: Behavior normal.     ED Results and Treatments Labs (all labs ordered are listed, but only abnormal results are displayed) Labs Reviewed  BASIC METABOLIC PANEL - Abnormal; Notable for the following components:      Result Value   Glucose, Bld 113 (*)    Creatinine, Ser 1.50 (*)    GFR, Estimated 35 (*)    All other components within normal limits  CBC  PROTIME-INR  TROPONIN I (HIGH SENSITIVITY)  TROPONIN I (HIGH SENSITIVITY)  Radiology DG Chest 2 View  Result Date: 04/21/2023 CLINICAL DATA:  Fatigue and shortness of breath EXAM: CHEST - 2 VIEW COMPARISON:  07/04/2022 FINDINGS: Cardiac shadow is mildly enlarged. Lungs are well aerated bilaterally. No focal infiltrate is seen. No acute bony abnormality is noted. IMPRESSION: No active cardiopulmonary disease. Electronically Signed   By: Alcide Clever M.D.   On: 04/21/2023 00:04    Pertinent labs & imaging results that were available during my care of the patient were reviewed by me and considered in my medical decision making (see MDM for details).  Medications Ordered in ED Medications - No data to display                                                                                                                                   Procedures Procedures  (including critical care time)  Medical Decision Making / ED Course    Medical Decision Making:    Cariann Weitman is a 82 y.o. female with past medical history as below, significant for afib, CAD, DM, CKD4, CVA, HFrEF who presents to the ED with complaint of  fatigue. . The complaint involves an extensive differential diagnosis and also carries with it a high risk of complications and morbidity.  Serious etiology was considered. Ddx includes but is not limited to: Metabolic derangement, cardiac, A-fib, volume status, stroke, medication effect, viral syndrome, etc.  Complete initial physical exam performed, notably the patient  was no acute distress, breathing comfortably on room air.  No focal neurologic deficits on exam..    Reviewed and confirmed nursing documentation for past medical history, family history, social history.  Vital signs reviewed.    Clinical Course as of 04/21/23 0737  Mon Apr 21, 2023  0215 Creatinine(!): 1.50 Similar to baseline  [SG]    Clinical Course User Index [SG] Sloan Leiter, DO   Patient here with concern for generalized fatigue, malaise over the past 4 hours. Her physical exam is reassuring, there are no focal deficits she is HDS.  No hypoxia.  Dyspnea was noted in triage but patient denies this complaint on my assessment Reviewed and are stable.  Creatinine is at her baseline. No negative x 2.  EKG without acute ischemic changes She is compliant with her Eliquis  Labs / imaging resulted and is stable She is feeling better Ambulatory w/ steady gait Neuro exam is non focal HDS, on ambient air No pain, no discomfort Exam is benign Unclear etiology of her fatigue, does not appear to be acute life threatening abnormality. Consider fatigue a/w over exertion in setting of holiday Advised close f/u with her pcp Plan d/w family at bedside  The patient improved significantly and was discharged in stable condition. Detailed discussions were had with the patient regarding current findings, and need for close f/u with PCP or on call doctor. The patient has been instructed to return immediately if the symptoms worsen in  any way for re-evaluation. Patient verbalized understanding and is in agreement with current care  plan. All questions answered prior to discharge.    Additional history obtained: -Additional history obtained from family -External records from outside source obtained and reviewed including: Chart review including previous notes, labs, imaging, consultation notes including prior labs and imaging, home medications, primary care recommendation   Lab Tests: -I ordered, reviewed, and interpreted labs.   The pertinent results include:   Labs Reviewed  BASIC METABOLIC PANEL - Abnormal; Notable for the following components:      Result Value   Glucose, Bld 113 (*)    Creatinine, Ser 1.50 (*)    GFR, Estimated 35 (*)    All other components within normal limits  CBC  PROTIME-INR  TROPONIN I (HIGH SENSITIVITY)  TROPONIN I (HIGH SENSITIVITY)    Notable for stable  EKG   EKG Interpretation  Date/Time:  Sunday Apr 20 2023 23:52:26 EDT Ventricular Rate:  79 PR Interval:  160 QRS Duration: 88 QT Interval:  434 QTC Calculation: 497 R Axis:   -10 Text Interpretation: Sinus rhythm with occasional Premature ventricular complexes Minimal voltage criteria for LVH, may be normal variant ( Cornell product ) Cannot rule out Anteroseptal infarct , age undetermined ST & T wave abnormality, consider lateral ischemia Abnormal ECG When compared with ECG of 04-Jul-2022 10:22, PREVIOUS ECG IS PRESENT Interpretation limited secondary to artifact Confirmed by Tanda Rockers ([id number]) on 04/21/2023 2:16:22 AM         Imaging Studies ordered: I ordered imaging studies including chest x-ray I independently visualized the following imaging with scope of interpretation limited to determining acute life threatening conditions related to emergency care; findings noted above, significant for no acute changes I independently visualized and interpreted imaging. I agree with the radiologist interpretation   Medicines ordered and prescription drug management: No orders of the defined types were placed in this  encounter.   -I have reviewed the patients home medicines and have made adjustments as needed   Consultations Obtained: Not applicable  Cardiac Monitoring: The patient was maintained on a cardiac monitor.  I personally viewed and interpreted the cardiac monitored which showed an underlying rhythm of: NSR Pulse oximetry 99 to 100%  Social Determinants of Health:  Diagnosis or treatment significantly limited by social determinants of health: Non-smoker   Reevaluation: After the interventions noted above, I reevaluated the patient and found that they have improved  Co morbidities that complicate the patient evaluation  Past Medical History:  Diagnosis Date   Atrial fibrillation (HCC)    CHF (congestive heart failure) (HCC)    Coronary artery disease    Diabetes mellitus without complication (HCC)    Hypercholesteremia    Hypertension    Renal disorder    CKD Stage IV   Stroke [facility])       Dispostion: Disposition decision including need for hospitalization was considered, and patient discharged from emergency department.    Final Clinical Impression(s) / ED Diagnoses Final diagnoses:  Other fatigue     This chart was dictated using voice recognition software.  Despite best efforts to proofread,  errors can occur which can change the documentation meaning.    Sloan Leiter, DO 04/21/23 [phone]

## 2023-04-21 NOTE — ED Notes (Signed)
Pt ambulated to bathroom 

## 2023-04-21 NOTE — ED Notes (Signed)
Pt ambulatory to rr and back with steady gait

## 2023-04-25 DIAGNOSIS — G4700 Insomnia, unspecified: Secondary | ICD-10-CM | POA: Diagnosis not present

## 2023-04-25 DIAGNOSIS — Z09 Encounter for follow-up examination after completed treatment for conditions other than malignant neoplasm: Secondary | ICD-10-CM | POA: Diagnosis not present

## 2023-05-02 ENCOUNTER — Other Ambulatory Visit: Payer: Self-pay

## 2023-05-02 DIAGNOSIS — I5032 Chronic diastolic (congestive) heart failure: Secondary | ICD-10-CM

## 2023-05-04 ENCOUNTER — Other Ambulatory Visit: Payer: Self-pay | Admitting: Cardiology

## 2023-05-04 DIAGNOSIS — I5042 Chronic combined systolic (congestive) and diastolic (congestive) heart failure: Secondary | ICD-10-CM

## 2023-05-06 ENCOUNTER — Other Ambulatory Visit: Payer: Self-pay | Admitting: Cardiology

## 2023-05-06 DIAGNOSIS — I2510 Atherosclerotic heart disease of native coronary artery without angina pectoris: Secondary | ICD-10-CM

## 2023-05-06 DIAGNOSIS — H40013 Open angle with borderline findings, low risk, bilateral: Secondary | ICD-10-CM | POA: Diagnosis not present

## 2023-05-06 DIAGNOSIS — H02403 Unspecified ptosis of bilateral eyelids: Secondary | ICD-10-CM | POA: Diagnosis not present

## 2023-05-06 DIAGNOSIS — H1045 Other chronic allergic conjunctivitis: Secondary | ICD-10-CM | POA: Diagnosis not present

## 2023-05-07 ENCOUNTER — Other Ambulatory Visit: Payer: Self-pay | Admitting: Cardiology

## 2023-05-07 DIAGNOSIS — I480 Paroxysmal atrial fibrillation: Secondary | ICD-10-CM

## 2023-05-07 DIAGNOSIS — Z7901 Long term (current) use of anticoagulants: Secondary | ICD-10-CM

## 2023-05-18 ENCOUNTER — Other Ambulatory Visit: Payer: Self-pay | Admitting: Cardiology

## 2023-06-02 ENCOUNTER — Other Ambulatory Visit: Payer: Self-pay | Admitting: Cardiology

## 2023-06-02 DIAGNOSIS — I5032 Chronic diastolic (congestive) heart failure: Secondary | ICD-10-CM

## 2023-07-04 ENCOUNTER — Other Ambulatory Visit: Payer: Self-pay | Admitting: Cardiology

## 2023-07-04 DIAGNOSIS — I2510 Atherosclerotic heart disease of native coronary artery without angina pectoris: Secondary | ICD-10-CM

## 2023-07-07 ENCOUNTER — Other Ambulatory Visit: Payer: Self-pay | Admitting: Cardiology

## 2023-07-07 DIAGNOSIS — I2510 Atherosclerotic heart disease of native coronary artery without angina pectoris: Secondary | ICD-10-CM

## 2023-07-25 DIAGNOSIS — Z9181 History of falling: Secondary | ICD-10-CM | POA: Diagnosis not present

## 2023-07-25 DIAGNOSIS — M542 Cervicalgia: Secondary | ICD-10-CM | POA: Diagnosis not present

## 2023-07-25 DIAGNOSIS — M4692 Unspecified inflammatory spondylopathy, cervical region: Secondary | ICD-10-CM | POA: Diagnosis not present

## 2023-07-28 ENCOUNTER — Encounter: Payer: Self-pay | Admitting: Emergency Medicine

## 2023-07-28 ENCOUNTER — Ambulatory Visit
Admission: EM | Admit: 2023-07-28 | Discharge: 2023-07-28 | Disposition: A | Discharge status: Home / Self Care | Payer: Medicare HMO | Attending: Internal Medicine | Admitting: Internal Medicine

## 2023-07-28 DIAGNOSIS — M542 Cervicalgia: Secondary | ICD-10-CM

## 2023-07-28 MED ORDER — PREDNISONE 20 MG PO TABS: 20.0000 mg | ORAL_TABLET | Freq: Every day | ORAL | 0 refills | Status: AC

## 2023-07-28 NOTE — ED Triage Notes (Signed)
Pt presents with neck and back pain xs 1 weeks. Pt states feels "heavy Headed".

## 2023-07-28 NOTE — Discharge Instructions (Signed)
I have prescribed prednisone to attempt to decrease inflammation to help with your neck pain.  Please follow-up with spine specialist as soon as possible.  I have also provided you with addresses for 2 orthopedic urgent cares called [facility] and [facility] if you feel that you need to follow-up with them.

## 2023-07-28 NOTE — ED Provider Notes (Signed)
[name]    CSN: [id number] Arrival date & time: 07/28/23  1437      History   Chief Complaint Chief Complaint  Patient presents with   Torticollis   Back Pain    HPI Kristin [name] is a 82 y.o. female.   Patient presents with neck and upper back/shoulder pain that started about a week or so ago.  Patient reports that it feels very "heavy".  She was seen at primary care doctor on 07/25/2023 for evaluation of the same symptoms.  States that she was told that she had arthritis in her neck which was previously evaluated as well.  She was referred to a spine specialist but daughter reports that they have not yet contacted her for an appointment.  She has been told to take Tylenol and has been prescribed Robaxin to take as needed as well with minimal improvement.  Denies any injury to the area.  She does report that she has had lumbar surgery in the past.   Back Pain   Past Medical History:  Diagnosis Date   Atrial fibrillation (HCC)    CHF (congestive heart failure) (HCC)    Coronary artery disease    Diabetes mellitus without complication (HCC)    Hypercholesteremia    Hypertension    Renal disorder    CKD Stage IV   Stroke [facility])     Patient Active Problem List   Diagnosis Date Noted   Status post coronary artery stent placement    CKD (chronic kidney disease) stage 4, GFR 15-29 ml/min (HCC) 05/11/2021   HFrEF (heart failure with reduced ejection fraction) (HCC)    Unilateral primary osteoarthritis, left knee 04/16/2021   Unilateral primary osteoarthritis, right knee 10/03/2020   Cerebral embolism with cerebral infarction 08/28/2020   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 (HCC)    AKI (acute kidney injury) (HCC)    Atherosclerosis of native coronary artery of native heart without angina pectoris    Atrial fibrillation (HCC)    Mixed hyperlipidemia    Pulmonary edema    Acute HFrEF (heart failure with reduced ejection  fraction) (HCC)    Cardiomyopathy (HCC)    Effusion of right knee joint    SIRS (systemic inflammatory response syndrome) (HCC) 08/18/2020   Lower extremity weakness 08/18/2020   Hypertension    Chronic low back pain with bilateral sciatica     Past Surgical History:  Procedure Laterality Date   CORONARY STENT INTERVENTION N/A 05/15/2021   Procedure: CORONARY STENT INTERVENTION;  Surgeon: Elder Negus, MD;  Location: [facility] INVASIVE CV LAB;  Service: Cardiovascular;  Laterality: N/A;   LEFT HEART CATH AND CORONARY ANGIOGRAPHY N/A 05/11/2021   Procedure: LEFT HEART CATH AND CORONARY ANGIOGRAPHY;  Surgeon: Elder Negus, MD;  Location: [facility] INVASIVE CV LAB;  Service: Cardiovascular;  Laterality: N/A;   LUMBAR LAMINECTOMY/DECOMPRESSION MICRODISCECTOMY N/A 08/19/2020   Procedure: LUMBAR LAMINECTOMY/DECOMPRESSION  Lumbar two-three, Lumbar three-four;  Surgeon: Dawley, Alan Mulder, DO;  Location: [facility] OR;  Service: Neurosurgery;  Laterality: N/A;   RIGHT/LEFT HEART CATH AND CORONARY ANGIOGRAPHY N/A 08/23/2020   Procedure: RIGHT/LEFT HEART CATH AND CORONARY ANGIOGRAPHY;  Surgeon: Yates Decamp, MD;  Location: [facility]VASIVE CV LAB;  Service: Cardiovascular;  Laterality: N/A;    OB History   No obstetric history on file.      Home Medications    Prior to Admission medications   Medication Sig Start Date End Date Taking? Authorizing Provider  predniSONE (  DELTASONE) 20 MG tablet Take 1 tablet (20 mg total) by mouth daily for 5 days. 07/28/23 08/02/23 Yes Joslyn Ramos, Rolly Salter E, FNP  carvedilol (COREG) 3.125 MG tablet TAKE 1 TABLET BY MOUTH TWICE DAILY WITH A MEAL 07/07/23   Tolia, Sunit, DO  cholecalciferol (VITAMIN D) 25 MCG (1000 UNIT) tablet Take 2,000 Units by mouth daily.    [provider]  colchicine 0.6 MG tablet Take 0.5 tablets (0.3 mg total) by mouth daily. Patient taking differently: Take 0.6 mg by mouth daily. 08/31/20   Glade Lloyd, MD  dapagliflozin propanediol (FARXIGA) 10 MG TABS  tablet TAKE 1 TABLET BY MOUTH ONCE DAILY BEFORE BREAKFAST 06/02/23   Tolia, Sunit, DO  diclofenac Sodium (VOLTAREN) 1 % GEL Apply 2 g topically 4 (four) times daily. 08/30/20   Glade Lloyd, MD  ELIQUIS 2.5 MG TABS tablet Take 1 tablet by mouth twice daily 05/08/23   Odis Hollingshead, Sunit, DO  ENTRESTO 49-51 MG Take 1 tablet by mouth twice daily 05/06/23   Odis Hollingshead, Sunit, DO  ezetimibe (ZETIA) 10 MG tablet Take 1 tablet by mouth once daily 07/07/23   Tolia, Sunit, DO  hydrALAZINE (APRESOLINE) 100 MG tablet TAKE 1 TABLET BY MOUTH THREE TIMES DAILY 05/06/23   Odis Hollingshead, Sunit, DO  isosorbide mononitrate (IMDUR) 120 MG 24 hr tablet Take 1 tablet by mouth once daily 07/07/23   Tolia, Sunit, DO  nitroGLYCERIN (NITROSTAT) 0.4 MG SL tablet Place 1 tablet (0.4 mg total) under the tongue every 5 (five) minutes as needed for up to 30 doses for chest pain. 08/02/22   Tolia, Sunit, DO  pantoprazole (PROTONIX) 40 MG tablet Take 1 tablet (40 mg total) by mouth daily. 08/30/20   Glade Lloyd, MD  rosuvastatin (CRESTOR) 40 MG tablet TAKE 1 TABLET BY MOUTH AT BEDTIME 02/13/23   Tessa Lerner, DO    Family History Family History  Problem Relation Age of Onset   Diabetes Father    Heart attack Father    Heart disease Mother    Hypertension Sister    Hypertension Sister    Hypertension Sister    Hypertension Sister    Hypertension Sister    Hypertension Sister    Hypertension Sister    Hypertension Sister    Hypertension Sister    Hypertension Sister    Hypertension Sister    Hypertension Sister     Social History Social History   Tobacco Use   Smoking status: Never   Smokeless tobacco: Never  Vaping Use   Vaping status: Never Used  Substance Use Topics   Alcohol use: Never   Drug use: Never     Allergies   Tramadol   Review of Systems Review of Systems Per HPI  Physical Exam Triage Vital Signs ED Triage Vitals  Encounter Vitals Group     BP 07/28/23 1452 109/68     Systolic BP Percentile --       Diastolic BP Percentile --      Pulse Rate 07/28/23 1452 85     Resp 07/28/23 1452 17     Temp 07/28/23 1452 99 F (37.2 C)     Temp Source 07/28/23 1452 Oral     SpO2 07/28/23 1452 95 %     Weight --      Height --      Head Circumference --      Peak Flow --      Pain Score 07/28/23 1451 9     Pain Loc --  Pain Education --      Exclude from Growth Chart --    No data found.  Updated Vital Signs BP 109/68 (BP Location: Left Arm)   Pulse 85   Temp 99 F (37.2 C) (Oral)   Resp 17   SpO2 95%   Visual Acuity Right Eye Distance:   Left Eye Distance:   Bilateral Distance:    Right Eye Near:   Left Eye Near:    Bilateral Near:     Physical Exam Constitutional:      General: She is not in acute distress.    Appearance: Normal appearance. She is not toxic-appearing or diaphoretic.  HENT:     Head: Normocephalic and atraumatic.  Eyes:     Extraocular Movements: Extraocular movements intact.     Conjunctiva/sclera: Conjunctivae normal.  Pulmonary:     Effort: Pulmonary effort is normal.  Musculoskeletal:     Comments: Patient has tenderness to palpation throughout posterior and bilateral lateral neck.  No obvious crepitus or step-off noted.  No obvious swelling or discoloration.  Patient has full range of motion of neck but range of motion elicits pain.  Has full range of motion of upper extremities with grip strength being 5/5.  Neurological:     General: No focal deficit present.     Mental Status: She is alert and oriented to person, place, and time. Mental status is at baseline.  Psychiatric:        Mood and Affect: Mood normal.        Behavior: Behavior normal.        Thought Content: Thought content normal.        Judgment: Judgment normal.      UC Treatments / Results  Labs (all labs ordered are listed, but only abnormal results are displayed) Labs Reviewed - No data to display  EKG   Radiology No results found.  Procedures Procedures  (including critical care time)  Medications Ordered in UC Medications - No data to display  Initial Impression / Assessment and Plan / UC Course  I have reviewed the triage vital signs and the nursing notes.  Pertinent labs & imaging results that were available during my care of the patient were reviewed by me and considered in my medical decision making (see chart for details).     Given no injury, Imaging was deferred today.  Previous PCP note reports that patient has arthritis, therefore patient most likely needs to see spine specialty.  Provided patient with spine specialty contact information if referral that was placed does not to go through adequately.  I am not able to see this referral on my end.  Also provided patient with orthopedic urgent care address if she feels this is necessary to follow-up.  Patient already taking Robaxin and Tylenol.  Unable to take NSAIDs.  I do think patient would benefit from a short course and low-dose of prednisone to decrease inflammation associated with the pain.  She reports that she has taken this before and tolerated well.  Patient states that it does not increase her blood sugar but encouraged her to monitor blood sugar closely at home as she reports that she does have a glucose monitor.  Patient does have CHF but last EF was 55 to 60% so low dose should be safe.  Although discussed possible adverse effects and strict follow-up.  Patient verbalized understanding and was agreeable with plan. Final Clinical Impressions(s) / UC Diagnoses   Final diagnoses:  Neck pain  Discharge Instructions      I have prescribed prednisone to attempt to decrease inflammation to help with your neck pain.  Please follow-up with spine specialist as soon as possible.  I have also provided you with addresses for 2 orthopedic urgent cares called [facility] and [facility] if you feel that you need to follow-up with them.    ED Prescriptions      Medication Sig Dispense Auth. Provider   predniSONE (DELTASONE) 20 MG tablet Take 1 tablet (20 mg total) by mouth daily for 5 days. 5 tablet [place], Acie Fredrickson, [place]      PDMP not reviewed this encounter.   Gustavus Bryant, [place] 07/28/23 [phone]

## 2023-07-31 ENCOUNTER — Other Ambulatory Visit: Payer: Self-pay | Admitting: Cardiology

## 2023-07-31 DIAGNOSIS — I5042 Chronic combined systolic (congestive) and diastolic (congestive) heart failure: Secondary | ICD-10-CM

## 2023-08-01 DIAGNOSIS — E1122 Type 2 diabetes mellitus with diabetic chronic kidney disease: Secondary | ICD-10-CM | POA: Diagnosis not present

## 2023-08-01 DIAGNOSIS — I639 Cerebral infarction, unspecified: Secondary | ICD-10-CM | POA: Diagnosis not present

## 2023-08-01 DIAGNOSIS — G894 Chronic pain syndrome: Secondary | ICD-10-CM | POA: Diagnosis not present

## 2023-08-01 DIAGNOSIS — N1832 Chronic kidney disease, stage 3b: Secondary | ICD-10-CM | POA: Diagnosis not present

## 2023-08-01 DIAGNOSIS — I129 Hypertensive chronic kidney disease with stage 1 through stage 4 chronic kidney disease, or unspecified chronic kidney disease: Secondary | ICD-10-CM | POA: Diagnosis not present

## 2023-08-01 DIAGNOSIS — I5020 Unspecified systolic (congestive) heart failure: Secondary | ICD-10-CM | POA: Diagnosis not present

## 2023-08-04 DIAGNOSIS — M542 Cervicalgia: Secondary | ICD-10-CM | POA: Diagnosis not present

## 2023-08-04 DIAGNOSIS — Z6832 Body mass index (BMI) 32.0-32.9, adult: Secondary | ICD-10-CM | POA: Diagnosis not present

## 2023-08-05 ENCOUNTER — Other Ambulatory Visit: Payer: Self-pay | Admitting: Surgery

## 2023-08-05 DIAGNOSIS — G8929 Other chronic pain: Secondary | ICD-10-CM

## 2023-08-12 ENCOUNTER — Other Ambulatory Visit: Payer: Self-pay | Admitting: Cardiology

## 2023-08-12 DIAGNOSIS — I480 Paroxysmal atrial fibrillation: Secondary | ICD-10-CM

## 2023-08-12 DIAGNOSIS — Z7901 Long term (current) use of anticoagulants: Secondary | ICD-10-CM

## 2023-08-21 NOTE — Telephone Encounter (Signed)
This encounter was created in error - please disregard.

## 2023-08-22 DIAGNOSIS — E1122 Type 2 diabetes mellitus with diabetic chronic kidney disease: Secondary | ICD-10-CM | POA: Diagnosis not present

## 2023-08-22 DIAGNOSIS — I700 Atherosclerosis of aorta: Secondary | ICD-10-CM | POA: Diagnosis not present

## 2023-08-22 DIAGNOSIS — Z23 Encounter for immunization: Secondary | ICD-10-CM | POA: Diagnosis not present

## 2023-08-22 DIAGNOSIS — E782 Mixed hyperlipidemia: Secondary | ICD-10-CM | POA: Diagnosis not present

## 2023-08-22 DIAGNOSIS — I130 Hypertensive heart and chronic kidney disease with heart failure and stage 1 through stage 4 chronic kidney disease, or unspecified chronic kidney disease: Secondary | ICD-10-CM | POA: Diagnosis not present

## 2023-08-22 DIAGNOSIS — E118 Type 2 diabetes mellitus with unspecified complications: Secondary | ICD-10-CM | POA: Diagnosis not present

## 2023-08-22 DIAGNOSIS — Z0000 Encounter for general adult medical examination without abnormal findings: Secondary | ICD-10-CM | POA: Diagnosis not present

## 2023-08-22 DIAGNOSIS — D6869 Other thrombophilia: Secondary | ICD-10-CM | POA: Diagnosis not present

## 2023-08-22 DIAGNOSIS — M1A9XX0 Chronic gout, unspecified, without tophus (tophi): Secondary | ICD-10-CM | POA: Diagnosis not present

## 2023-08-22 DIAGNOSIS — I480 Paroxysmal atrial fibrillation: Secondary | ICD-10-CM | POA: Diagnosis not present

## 2023-08-22 DIAGNOSIS — I5042 Chronic combined systolic (congestive) and diastolic (congestive) heart failure: Secondary | ICD-10-CM | POA: Diagnosis not present

## 2023-08-22 DIAGNOSIS — Z79899 Other long term (current) drug therapy: Secondary | ICD-10-CM | POA: Diagnosis not present

## 2023-08-22 DIAGNOSIS — N1832 Chronic kidney disease, stage 3b: Secondary | ICD-10-CM | POA: Diagnosis not present

## 2023-08-22 DIAGNOSIS — E559 Vitamin D deficiency, unspecified: Secondary | ICD-10-CM | POA: Diagnosis not present

## 2023-09-01 ENCOUNTER — Other Ambulatory Visit: Payer: Self-pay | Admitting: Cardiology

## 2023-09-01 DIAGNOSIS — I5032 Chronic diastolic (congestive) heart failure: Secondary | ICD-10-CM

## 2023-09-02 ENCOUNTER — Other Ambulatory Visit: Payer: Self-pay

## 2023-09-02 ENCOUNTER — Encounter (HOSPITAL_COMMUNITY): Payer: Self-pay

## 2023-09-02 ENCOUNTER — Emergency Department (HOSPITAL_COMMUNITY)
Admission: EM | Admit: 2023-09-02 | Discharge: 2023-09-02 | Disposition: A | Discharge status: Home / Self Care | Payer: Medicare HMO | Attending: Emergency Medicine | Admitting: Emergency Medicine

## 2023-09-02 DIAGNOSIS — N189 Chronic kidney disease, unspecified: Secondary | ICD-10-CM | POA: Insufficient documentation

## 2023-09-02 DIAGNOSIS — I130 Hypertensive heart and chronic kidney disease with heart failure and stage 1 through stage 4 chronic kidney disease, or unspecified chronic kidney disease: Secondary | ICD-10-CM | POA: Insufficient documentation

## 2023-09-02 DIAGNOSIS — Z7901 Long term (current) use of anticoagulants: Secondary | ICD-10-CM | POA: Diagnosis not present

## 2023-09-02 DIAGNOSIS — M546 Pain in thoracic spine: Secondary | ICD-10-CM | POA: Insufficient documentation

## 2023-09-02 DIAGNOSIS — Z7984 Long term (current) use of oral hypoglycemic drugs: Secondary | ICD-10-CM | POA: Diagnosis not present

## 2023-09-02 DIAGNOSIS — I509 Heart failure, unspecified: Secondary | ICD-10-CM | POA: Diagnosis not present

## 2023-09-02 DIAGNOSIS — Z8673 Personal history of transient ischemic attack (TIA), and cerebral infarction without residual deficits: Secondary | ICD-10-CM | POA: Insufficient documentation

## 2023-09-02 DIAGNOSIS — M542 Cervicalgia: Secondary | ICD-10-CM | POA: Insufficient documentation

## 2023-09-02 DIAGNOSIS — E1122 Type 2 diabetes mellitus with diabetic chronic kidney disease: Secondary | ICD-10-CM | POA: Insufficient documentation

## 2023-09-02 DIAGNOSIS — I2510 Atherosclerotic heart disease of native coronary artery without angina pectoris: Secondary | ICD-10-CM | POA: Insufficient documentation

## 2023-09-02 LAB — I-STAT CHEM 8, ED
BUN: 37 mg/dL — ABNORMAL HIGH (ref 8–23)
Calcium, Ion: 1.11 mmol/L — ABNORMAL LOW (ref 1.15–1.40)
Chloride: 101 mmol/L (ref 98–111)
Creatinine, Ser: 1.7 mg/dL — ABNORMAL HIGH (ref 0.44–1.00)
Glucose, Bld: 142 mg/dL — ABNORMAL HIGH (ref 70–99)
HCT: 38 % (ref 36.0–46.0)
Hemoglobin: 12.9 g/dL (ref 12.0–15.0)
Potassium: 4.1 mmol/L (ref 3.5–5.1)
Sodium: 138 mmol/L (ref 135–145)
TCO2: 28 mmol/L (ref 22–32)

## 2023-09-02 MED ORDER — HYDROCODONE-ACETAMINOPHEN 5-325 MG PO TABS
1.0000 | ORAL_TABLET | Freq: Once | ORAL | Status: AC
  Administered 2023-09-02: 1 via ORAL
  Filled 2023-09-02: qty 1

## 2023-09-02 MED ORDER — CYCLOBENZAPRINE HCL 10 MG PO TABS: 5.0000 mg | ORAL_TABLET | Freq: Two times a day (BID) | ORAL | 0 refills | Status: AC | PRN

## 2023-09-02 MED ORDER — DIAZEPAM 2 MG PO TABS
2.0000 mg | ORAL_TABLET | Freq: Once | ORAL | Status: AC
  Administered 2023-09-02: 2 mg via ORAL
  Filled 2023-09-02: qty 1

## 2023-09-02 MED ORDER — HYDROCODONE-ACETAMINOPHEN 5-325 MG PO TABS
1.0000 | ORAL_TABLET | Freq: Once | ORAL | Status: DC
  Filled 2023-09-02: qty 1

## 2023-09-02 MED ORDER — HYDROCODONE-ACETAMINOPHEN 5-325 MG PO TABS: 1.0000 | ORAL_TABLET | ORAL | 0 refills | Status: DC | PRN

## 2023-09-02 NOTE — Discharge Instructions (Signed)
Keep the scheduled appointment for the MRI on Friday. Take the medications provided regularly and see your doctor if further pain management is felt needed prior to the MRI study.   Return to the ED with any new or concerning symptoms at any time.

## 2023-09-02 NOTE — ED Triage Notes (Signed)
Pt neck pain, upper back pain started last week. Pt's daughter states pt supposed to get MRI on Friday, but pt in too much pain to wait until then. Pt c/o numbness and tingling in feet bilat several months. Pt walked from waitning room to triage.

## 2023-09-02 NOTE — ED Provider Notes (Signed)
[place] EMERGENCY DEPARTMENT AT [facility] Provider Note   CSN: [id number] Arrival date & time: 09/02/23  1432     History  Chief Complaint  Patient presents with   Neck Pain   Back Pain    Kristin [name] is a 82 y.o. female.  Patient with history of HTN, T2DM, HLD, CKD, CHF, CAD, CVA, atrial fibrillation on Eliquis, presents with neck and upper back pain that has been chronic, progressively worsening. She is here with her daughter who reports the patient's primary care doctor has scheduled her for an MRI cervical spine this coming Friday but she is in too much pain. She is taking Tylenol only without relief. No injury. No numbness of the upper extremity, no weakness. The left neck, upper back and lateral torso is where the bulk of the pain is.   The history is provided by the patient and a relative. No language interpreter was used.  Neck Pain Back Pain      Home Medications Prior to Admission medications   Medication Sig Start Date End Date Taking? Authorizing Provider  cyclobenzaprine (FLEXERIL) 10 MG tablet Take 0.5 tablets (5 mg total) by mouth 2 (two) times daily as needed for muscle spasms. 09/02/23  Yes Elpidio Anis, PA-C  HYDROcodone-acetaminophen (NORCO) 5-325 MG tablet Take 1 tablet by mouth every 4 (four) hours as needed for moderate pain. 09/02/23  Yes Lorie Melichar, PA-C  carvedilol (COREG) 3.125 MG tablet TAKE 1 TABLET BY MOUTH TWICE DAILY WITH A MEAL 07/07/23   Tolia, Sunit, DO  cholecalciferol (VITAMIN D) 25 MCG (1000 UNIT) tablet Take 2,000 Units by mouth daily.    [provider]  colchicine 0.6 MG tablet Take 0.5 tablets (0.3 mg total) by mouth daily. Patient taking differently: Take 0.6 mg by mouth daily. 08/31/20   Glade Lloyd, MD  dapagliflozin propanediol (FARXIGA) 10 MG TABS tablet TAKE 1 TABLET BY MOUTH ONCE DAILY BEFORE BREAKFAST 09/01/23   Tolia, Sunit, DO  diclofenac Sodium (VOLTAREN) 1 % GEL Apply 2 g topically 4 (four)  times daily. 08/30/20   Glade Lloyd, MD  ELIQUIS 2.5 MG TABS tablet Take 1 tablet by mouth twice daily 08/12/23   Odis Hollingshead, Sunit, DO  ENTRESTO 49-51 MG Take 1 tablet by mouth twice daily 07/31/23   Odis Hollingshead, Sunit, DO  ezetimibe (ZETIA) 10 MG tablet Take 1 tablet by mouth once daily 07/07/23   Tolia, Sunit, DO  hydrALAZINE (APRESOLINE) 100 MG tablet TAKE 1 TABLET BY MOUTH THREE TIMES DAILY 05/06/23   Tolia, Sunit, DO  isosorbide mononitrate (IMDUR) 120 MG 24 hr tablet Take 1 tablet by mouth once daily 07/07/23   Tolia, Sunit, DO  nitroGLYCERIN (NITROSTAT) 0.4 MG SL tablet Place 1 tablet (0.4 mg total) under the tongue every 5 (five) minutes as needed for up to 30 doses for chest pain. 08/02/22   Tolia, Sunit, DO  pantoprazole (PROTONIX) 40 MG tablet Take 1 tablet (40 mg total) by mouth daily. 08/30/20   Glade Lloyd, MD  rosuvastatin (CRESTOR) 40 MG tablet TAKE 1 TABLET BY MOUTH AT BEDTIME 02/13/23   Tolia, Sunit, DO      Allergies    Tramadol    Review of Systems   Review of Systems  Musculoskeletal:  Positive for back pain and neck pain.    Physical Exam Updated Vital Signs BP (!) 177/91   Pulse 73   Temp 98.2 F (36.8 C) (Oral)   Resp 17   Ht 5\' 4"  (1.626 m)  Wt 88.5 kg   SpO2 96%   BMI 33.49 kg/m  Physical Exam Vitals and nursing note reviewed.  Constitutional:      General: She is not in acute distress.    Appearance: Normal appearance.  HENT:     Head: Atraumatic.  Neck:     Comments: Midline and left paracervical tenderness without swelling. Tenderness extends to left scapula and left midaxillary line.  Abdominal:     Tenderness: There is no abdominal tenderness.  Musculoskeletal:        General: Normal range of motion.     Cervical back: Normal range of motion and neck supple.     Comments: FROM of the UE's  with full and symmetric strength. Pain is illicited with movement.    Skin:    General: Skin is warm and dry.     Findings: No erythema.  Neurological:      Sensory: No sensory deficit.     Comments: Upper extremity reflexes are symmetric.     ED Results / Procedures / Treatments   Labs (all labs ordered are listed, but only abnormal results are displayed) Labs Reviewed  I-STAT CHEM 8, ED - Abnormal; Notable for the following components:      Result Value   BUN 37 (*)    Creatinine, Ser 1.70 (*)    Glucose, Bld 142 (*)    Calcium, Ion 1.11 (*)    All other components within normal limits    EKG None  Radiology No results found.  Procedures Procedures    Medications Ordered in ED Medications  HYDROcodone-acetaminophen (NORCO/VICODIN) 5-325 MG per tablet 1 tablet (has no administration in time range)  HYDROcodone-acetaminophen (NORCO/VICODIN) 5-325 MG per tablet 1 tablet (1 tablet Oral Given 09/02/23 1857)  diazepam (VALIUM) tablet 2 mg (2 mg Oral Given 09/02/23 1857)    ED Course/ Medical Decision Making/ A&P Clinical Course as of 09/02/23 1956  Tue Sep 02, 2023  1702 Estimated GFR 34.8, c/w history. [SU]  1733 Patient with longstanding, progressive pain in the neck and left side, due for MRI in 3 days. Focus on emergency visit will be pain control. Single Norco and 2 mg valium provided. Will reassess.  [SU]  1950 Seen by Dr. Particia Nearing. Medications ordered for pain include 2 mg Valium and Norco. On recheck, she does not feel much improved. Discussed continued pain relief at home with same medications regularly used. 2nd Norco provided in ED. Ok to discharge home.  [SU]    Clinical Course User Index [SU] Elpidio Anis, PA-C                                 Medical Decision Making Risk Prescription drug management.           Final Clinical Impression(s) / ED Diagnoses Final diagnoses:  Neck pain    Rx / DC Orders ED Discharge Orders          Ordered    cyclobenzaprine (FLEXERIL) 10 MG tablet  2 times daily PRN        09/02/23 1954    HYDROcodone-acetaminophen (NORCO) 5-325 MG tablet  Every 4 hours PRN         09/02/23 1954              Elpidio Anis, PA-C 09/02/23 1956    Jacalyn Lefevre, MD 09/02/23 2236

## 2023-09-03 ENCOUNTER — Encounter: Payer: Self-pay | Admitting: Surgery

## 2023-09-03 DIAGNOSIS — M542 Cervicalgia: Secondary | ICD-10-CM | POA: Diagnosis not present

## 2023-09-04 DIAGNOSIS — H02831 Dermatochalasis of right upper eyelid: Secondary | ICD-10-CM | POA: Diagnosis not present

## 2023-09-04 DIAGNOSIS — H02423 Myogenic ptosis of bilateral eyelids: Secondary | ICD-10-CM | POA: Diagnosis not present

## 2023-09-04 DIAGNOSIS — H02422 Myogenic ptosis of left eyelid: Secondary | ICD-10-CM | POA: Diagnosis not present

## 2023-09-04 DIAGNOSIS — H0279 Other degenerative disorders of eyelid and periocular area: Secondary | ICD-10-CM | POA: Diagnosis not present

## 2023-09-04 DIAGNOSIS — H02834 Dermatochalasis of left upper eyelid: Secondary | ICD-10-CM | POA: Diagnosis not present

## 2023-09-04 DIAGNOSIS — H02421 Myogenic ptosis of right eyelid: Secondary | ICD-10-CM | POA: Diagnosis not present

## 2023-09-04 DIAGNOSIS — Z01818 Encounter for other preprocedural examination: Secondary | ICD-10-CM | POA: Diagnosis not present

## 2023-09-04 DIAGNOSIS — H53483 Generalized contraction of visual field, bilateral: Secondary | ICD-10-CM | POA: Diagnosis not present

## 2023-09-05 ENCOUNTER — Ambulatory Visit
Admission: RE | Admit: 2023-09-05 | Discharge: 2023-09-05 | Disposition: A | Discharge status: Home / Self Care | Payer: Medicare HMO | Source: Ambulatory Visit | Attending: Surgery | Admitting: Surgery

## 2023-09-05 DIAGNOSIS — M47812 Spondylosis without myelopathy or radiculopathy, cervical region: Secondary | ICD-10-CM | POA: Diagnosis not present

## 2023-09-05 DIAGNOSIS — M25512 Pain in left shoulder: Secondary | ICD-10-CM | POA: Diagnosis not present

## 2023-09-05 DIAGNOSIS — G8929 Other chronic pain: Secondary | ICD-10-CM

## 2023-09-05 DIAGNOSIS — M25511 Pain in right shoulder: Secondary | ICD-10-CM | POA: Diagnosis not present

## 2023-09-08 ENCOUNTER — Other Ambulatory Visit: Payer: Self-pay | Admitting: Cardiology

## 2023-09-19 DIAGNOSIS — M47812 Spondylosis without myelopathy or radiculopathy, cervical region: Secondary | ICD-10-CM | POA: Diagnosis not present

## 2023-09-19 DIAGNOSIS — M47816 Spondylosis without myelopathy or radiculopathy, lumbar region: Secondary | ICD-10-CM | POA: Diagnosis not present

## 2023-09-19 DIAGNOSIS — M542 Cervicalgia: Secondary | ICD-10-CM | POA: Diagnosis not present

## 2023-10-03 ENCOUNTER — Ambulatory Visit: Payer: Self-pay | Admitting: Cardiology

## 2023-10-03 DIAGNOSIS — M47816 Spondylosis without myelopathy or radiculopathy, lumbar region: Secondary | ICD-10-CM | POA: Diagnosis not present

## 2023-10-03 DIAGNOSIS — M47812 Spondylosis without myelopathy or radiculopathy, cervical region: Secondary | ICD-10-CM | POA: Diagnosis not present

## 2023-10-03 DIAGNOSIS — Z6831 Body mass index (BMI) 31.0-31.9, adult: Secondary | ICD-10-CM | POA: Diagnosis not present

## 2023-10-05 ENCOUNTER — Other Ambulatory Visit: Payer: Self-pay | Admitting: Cardiology

## 2023-10-09 NOTE — Progress Notes (Signed)
Cardiology Office Note    Patient Name: Kristin [name] Date of Encounter: 10/10/2023  Primary Care Provider:  Aliene Beams, MD Primary Cardiologist:  None Primary Electrophysiologist: None   Past Medical History    Past Medical History:  Diagnosis Date   Atrial fibrillation [facility])    CHF (congestive heart failure) (HCC)    Coronary artery disease    Diabetes mellitus without complication (HCC)    Hypercholesteremia    Hypertension    Renal disorder    CKD Stage IV   Stroke [facility])     History of Present Illness  Kristin [name] is a 82 y.o. female with a PMH of CAD s/p NSTEMI 08/2020 with multivessel CAD with proximal LAD occlusion and ostial RCA critical stenosis, ICM, HFrEF, paroxysmal AF (on Eliquis), acute cerebellar CVA, DM type II, CKD stage IV who presents today for 22-month follow-up.  [name] was seen initially by Dr. Odis Hollingshead during hospital admission of SOB and patient was found to have new onset AF with NSTEMI.  She underwent LHC that showed proximal LAD occlusion with collaterals from large D1, 80% ostial RCA with EF of 35% and anterior apical inferior akinesis. Aspirin and Eliquis were held as per neurosurgery. She then complained of dizziness and was diagnosed with acute left cerebellar stroke Neurology was consulted. She was started back on aspirin and Eliquis after clearance by neurosurgery.  She was seen in follow-up 04/06/2021 and reported shortness of breath with activity with chest pain requiring sublingual nitro.  She was admitted for 05/2021 with complaint of chest pain and underwent repeat LHC with staged PCI to the proximal/mid LAD.  She was seen in follow-up 05/2021 with complaint of right sided chest pain with antianginal medications uptitrated and was seen 1 month later with ongoing chest pain and Isordil was switched to Imdur 120 mg daily along with hydralazine increased to 20 mg 3 times daily and carvedilol 3.125 mg twice daily.  She was seen 3 months later and  was asymptomatic with chest pain.  She presented to the ED 07/04/2022 with complaint of chest pain with ACS workup negative and resolution of symptoms with GI cocktail. She was seen in follow-up and decision was made to hold off on stress testing due to symptoms being more related to GI.  She was last seen by Dr. Odis Hollingshead on 04/04/2023 and patient denied any chest pain or angina or heart failure symptoms.  She did not require any sublingual nitroglycerin and home blood pressures were reported controlled.  She was provided a sample for Kristin [name] was controlled currently on carvedilol.  During today's visit the patient reports that she has been doing well with no chest pain since her previous follow-up.  She was accompanied today by her daughter for today's visit.  She was noted to have elevated BP of 140/92 on arrival however BP was improved on second check at 128/84.  She reports compliance with her current medication regimen and denies any adverse reactions.  She has been tolerating her Kristin [name] denies any palpitations or bleeding with Eliquis.  Heart healthy diet and is monitoring her salt intake.  She is not physically active due to back pain.  She is scheduled to undergo steroid injections for her neck and back pain in the next few months.  Patient denies chest pain, palpitations, dyspnea, PND, orthopnea, nausea, vomiting, dizziness, syncope, edema, weight gain, or early satiety.   Review of Systems  Please see the history of present illness.    All other systems  reviewed and are otherwise negative except as noted above.  Physical Exam    Wt Readings from Last 3 Encounters:  10/10/23 181 lb 12.8 oz (82.5 kg)  09/02/23 195 lb 1.7 oz (88.5 kg)  04/04/23 195 lb 3.2 oz (88.5 kg)   VS: Vitals:   10/10/23 0944 10/10/23 1131  BP: (!) 140/92 128/84  Pulse: 72   SpO2: 97%   ,Body mass index is 31.21 kg/m. GEN: Well nourished, well developed in no acute distress Neck: No JVD; No carotid bruits Pulmonary:  Clear to auscultation without rales, wheezing or rhonchi  Cardiovascular: Normal rate. Regular rhythm. Normal S1. Normal S2.   Murmurs: There is no murmur.  ABDOMEN: Soft, non-tender, non-distended EXTREMITIES:  No edema; No deformity   EKG/LABS/ Recent Cardiac Studies   ECG personally reviewed by me today -sinus rhythm with rate of 82 bpm and no acute changes with TWI in lead III consistent with previous EKG.  Risk Assessment/Calculations:    CHA2DS2-VASc Score = 9   This indicates a 12.2% annual risk of stroke. The patient's score is based upon: CHF History: 1 HTN History: 1 Diabetes History: 1 Stroke History: 2 Vascular Disease History: 1 Age Score: 2 Gender Score: 1         Lab Results  Component Value Date   WBC 5.1 04/20/2023   HGB 12.9 09/02/2023   HCT 38.0 09/02/2023   MCV 98.7 04/20/2023   PLT 200 04/20/2023   Lab Results  Component Value Date   CREATININE 1.70 (H) 09/02/2023   BUN 37 (H) 09/02/2023   NA 138 09/02/2023   K 4.1 09/02/2023   CL 101 09/02/2023   CO2 28 04/20/2023   Lab Results  Component Value Date   CHOL 124 03/26/2023   HDL 53 03/26/2023   LDLCALC 56 03/26/2023   LDLDIRECT 51 03/26/2023   TRIG 72 03/26/2023   CHOLHDL 2.3 03/26/2023    Lab Results  Component Value Date   HGBA1C 6.1 (H) 05/11/2021   Assessment & Plan    1.  Coronary artery disease: -s/p NSTEMI 08/2020 with intervention deferred due to new onset CVA and repeat LHC 05/2021 with staged PCI/DES to proximal mid LAD and mid to ostial RCA stenosis -Today patient reports no chest pain and has not required any use of nitroglycerin since her previous follow-up. -Continue current GDMT with carvedilol 3.125 mg twice daily, ezetimibe 10 mg daily, Crestor 40 mg daily, Imdur 120 mg daily  2.  HFrEF: -Last 2D echo completed 06/2021 with EF of 40-45% and mild hypokinesis with grade 2 DD and moderate TVR with moderate pulmonary HTN with PASP of 59 mmHg -NYHA class II and euvolemic on  exam -Continue current GDMT with Entresto 49/51 mg, Imdur 120 mg daily, Farxiga 10 mg daily, carvedilol 3.125 mg twice daily -Low sodium diet, fluid restriction <2L, and daily weights encouraged. Educated to contact our office for weight gain of 2 lbs overnight or 5 lbs in one week.   3.  Paroxysmal AF: -Today patient is rate controlled and in sinus rhythm today -Patient's last creatinine was 1.7 and hemoglobin was 12.9 -Continue Eliquis 2.5 mg twice daily -CHA2DS2-VASc Score = 9 [CHF History: 1, HTN History: 1, Diabetes History: 1, Stroke History: 2, Vascular Disease History: 1, Age Score: 2, Gender Score: 1].  Therefore, the patient's annual risk of stroke is 12.2 %.      4.  CKD stage IIIb/IV: -Patient currently followed by nephrology -Most recent creatinine was 1.7  5.  History of CVA: -s/p  acute left cerebellar stroke in 08/2020 in the setting of holding Eliquis and aspirin -Continue GDMT with Eliquis 2.5 mg twice daily, Crestor 40 mg daily and ezetimibe 10 mg daily  Disposition: Follow-up with None or APP in 6 months    Signed, Napoleon Form, Leodis Rains, NP 10/10/2023, 11:35 AM  [facility]

## 2023-10-10 ENCOUNTER — Encounter: Payer: Self-pay | Admitting: Nurse Practitioner

## 2023-10-10 ENCOUNTER — Ambulatory Visit: Payer: Medicare HMO | Attending: Nurse Practitioner | Admitting: Nurse Practitioner

## 2023-10-10 ENCOUNTER — Other Ambulatory Visit: Payer: Self-pay

## 2023-10-10 VITALS — BP 128/84 | HR 72 | Ht 64.0 in | Wt 181.8 lb

## 2023-10-10 DIAGNOSIS — I480 Paroxysmal atrial fibrillation: Secondary | ICD-10-CM

## 2023-10-10 DIAGNOSIS — I5032 Chronic diastolic (congestive) heart failure: Secondary | ICD-10-CM | POA: Diagnosis not present

## 2023-10-10 DIAGNOSIS — Z8673 Personal history of transient ischemic attack (TIA), and cerebral infarction without residual deficits: Secondary | ICD-10-CM

## 2023-10-10 DIAGNOSIS — Z7901 Long term (current) use of anticoagulants: Secondary | ICD-10-CM | POA: Diagnosis not present

## 2023-10-10 DIAGNOSIS — N184 Chronic kidney disease, stage 4 (severe): Secondary | ICD-10-CM | POA: Diagnosis not present

## 2023-10-10 DIAGNOSIS — I2510 Atherosclerotic heart disease of native coronary artery without angina pectoris: Secondary | ICD-10-CM

## 2023-10-10 DIAGNOSIS — I5042 Chronic combined systolic (congestive) and diastolic (congestive) heart failure: Secondary | ICD-10-CM

## 2023-10-10 DIAGNOSIS — E1122 Type 2 diabetes mellitus with diabetic chronic kidney disease: Secondary | ICD-10-CM

## 2023-10-10 DIAGNOSIS — E782 Mixed hyperlipidemia: Secondary | ICD-10-CM

## 2023-10-10 DIAGNOSIS — Z955 Presence of coronary angioplasty implant and graft: Secondary | ICD-10-CM

## 2023-10-10 DIAGNOSIS — N1832 Chronic kidney disease, stage 3b: Secondary | ICD-10-CM | POA: Diagnosis not present

## 2023-10-10 MED ORDER — CARVEDILOL 3.125 MG PO TABS
3.1250 mg | ORAL_TABLET | Freq: Two times a day (BID) | ORAL | 0 refills | Status: DC
Start: 2023-10-10 — End: 2023-11-03

## 2023-10-10 MED ORDER — DAPAGLIFLOZIN PROPANEDIOL 10 MG PO TABS
10.0000 mg | ORAL_TABLET | Freq: Every day | ORAL | 1 refills | Status: DC
Start: 2023-10-10 — End: 2024-08-20

## 2023-10-10 MED ORDER — ROSUVASTATIN CALCIUM 40 MG PO TABS
40.0000 mg | ORAL_TABLET | Freq: Every day | ORAL | 0 refills | Status: AC
Start: 2023-10-10 — End: ?

## 2023-10-10 MED ORDER — ENTRESTO 49-51 MG PO TABS
1.0000 | ORAL_TABLET | Freq: Two times a day (BID) | ORAL | 0 refills | Status: DC
Start: 2023-10-10 — End: 2024-01-27

## 2023-10-10 MED ORDER — ISOSORBIDE MONONITRATE ER 120 MG PO TB24
120.0000 mg | ORAL_TABLET | Freq: Every day | ORAL | 0 refills | Status: DC
Start: 2023-10-10 — End: 2024-01-06

## 2023-10-10 MED ORDER — APIXABAN 2.5 MG PO TABS
2.5000 mg | ORAL_TABLET | Freq: Two times a day (BID) | ORAL | 0 refills | Status: DC
Start: 2023-10-10 — End: 2024-02-06

## 2023-10-10 MED ORDER — HYDRALAZINE HCL 100 MG PO TABS
100.0000 mg | ORAL_TABLET | Freq: Three times a day (TID) | ORAL | 3 refills | Status: DC
Start: 2023-10-10 — End: 2024-10-05

## 2023-10-10 NOTE — Patient Instructions (Addendum)
Medication Instructions:  Your physician recommends that you continue on your current medications as directed. Please refer to the Current Medication list given to you today. *If you need a refill on your cardiac medications before your next appointment, please call your pharmacy*   Lab Work: 3 MONTH LFTs & LIPIDS (01/10/2024) If you have labs (blood work) drawn today and your tests are completely normal, you will receive your results only by: MyChart Message (if you have MyChart) OR A paper copy in the mail If you have any lab test that is abnormal or we need to change your treatment, we will call you to review the results.   Testing/Procedures: NONE ORDERED   Follow-Up: At [facility], you and your health needs are our priority.  As part of our continuing mission to provide you with exceptional heart care, we have created designated Provider Care Teams.  These Care Teams include your primary Cardiologist (physician) and Advanced Practice Providers (APPs -  Physician Assistants and Nurse Practitioners) who all work together to provide you with the care you need, when you need it.  We recommend signing up for the patient portal called "MyChart".  Sign up information is provided on this After Visit Summary.  MyChart is used to connect with patients for Virtual Visits (Telemedicine).  Patients are able to view lab/test results, encounter notes, upcoming appointments, etc.  Non-urgent messages can be sent to your provider as well.   To learn more about what you can do with MyChart, go to [email or web address].    Your next appointment:   6 month(s)  Provider:   Tessa Lerner, MD  Other Instructions  Check your blood pressure daily for 2 weeks, then contact the office with your readings.  Make sure to check 2 hours after your medications.   AVOID these things for 30 minutes before checking your blood pressure: No Drinking caffeine. No Drinking alcohol. No Eating. No  Smoking. No Exercising.  Five minutes before checking your blood pressure: Pee. Sit in a dining chair. Avoid sitting in a soft couch or armchair. Be quiet. Do not talk.

## 2023-10-20 DIAGNOSIS — M47816 Spondylosis without myelopathy or radiculopathy, lumbar region: Secondary | ICD-10-CM | POA: Diagnosis not present

## 2023-10-31 DIAGNOSIS — M47816 Spondylosis without myelopathy or radiculopathy, lumbar region: Secondary | ICD-10-CM | POA: Diagnosis not present

## 2023-10-31 DIAGNOSIS — Z6831 Body mass index (BMI) 31.0-31.9, adult: Secondary | ICD-10-CM | POA: Diagnosis not present

## 2023-11-02 ENCOUNTER — Other Ambulatory Visit: Payer: Self-pay

## 2023-11-02 ENCOUNTER — Encounter (HOSPITAL_COMMUNITY): Payer: Self-pay

## 2023-11-02 ENCOUNTER — Observation Stay (HOSPITAL_COMMUNITY)
Admission: EM | Admit: 2023-11-02 | Discharge: 2023-11-03 | Disposition: A | Discharge status: Home / Self Care | Payer: Medicare HMO | Attending: Family Medicine | Admitting: Family Medicine

## 2023-11-02 ENCOUNTER — Emergency Department (HOSPITAL_COMMUNITY): Payer: Medicare HMO

## 2023-11-02 DIAGNOSIS — R079 Chest pain, unspecified: Secondary | ICD-10-CM | POA: Diagnosis not present

## 2023-11-02 DIAGNOSIS — Z743 Need for continuous supervision: Secondary | ICD-10-CM | POA: Diagnosis not present

## 2023-11-02 DIAGNOSIS — I255 Ischemic cardiomyopathy: Secondary | ICD-10-CM

## 2023-11-02 DIAGNOSIS — Z79899 Other long term (current) drug therapy: Secondary | ICD-10-CM | POA: Insufficient documentation

## 2023-11-02 DIAGNOSIS — I480 Paroxysmal atrial fibrillation: Secondary | ICD-10-CM

## 2023-11-02 DIAGNOSIS — N1832 Chronic kidney disease, stage 3b: Secondary | ICD-10-CM | POA: Diagnosis not present

## 2023-11-02 DIAGNOSIS — I700 Atherosclerosis of aorta: Secondary | ICD-10-CM | POA: Diagnosis not present

## 2023-11-02 DIAGNOSIS — R9389 Abnormal findings on diagnostic imaging of other specified body structures: Secondary | ICD-10-CM | POA: Diagnosis not present

## 2023-11-02 DIAGNOSIS — E785 Hyperlipidemia, unspecified: Secondary | ICD-10-CM | POA: Diagnosis not present

## 2023-11-02 DIAGNOSIS — Z8673 Personal history of transient ischemic attack (TIA), and cerebral infarction without residual deficits: Secondary | ICD-10-CM | POA: Insufficient documentation

## 2023-11-02 DIAGNOSIS — I200 Unstable angina: Principal | ICD-10-CM

## 2023-11-02 DIAGNOSIS — Z955 Presence of coronary angioplasty implant and graft: Secondary | ICD-10-CM | POA: Diagnosis not present

## 2023-11-02 DIAGNOSIS — R0902 Hypoxemia: Secondary | ICD-10-CM | POA: Diagnosis not present

## 2023-11-02 DIAGNOSIS — E1122 Type 2 diabetes mellitus with diabetic chronic kidney disease: Secondary | ICD-10-CM | POA: Diagnosis not present

## 2023-11-02 DIAGNOSIS — I130 Hypertensive heart and chronic kidney disease with heart failure and stage 1 through stage 4 chronic kidney disease, or unspecified chronic kidney disease: Secondary | ICD-10-CM | POA: Insufficient documentation

## 2023-11-02 DIAGNOSIS — I4891 Unspecified atrial fibrillation: Secondary | ICD-10-CM | POA: Diagnosis present

## 2023-11-02 DIAGNOSIS — I213 ST elevation (STEMI) myocardial infarction of unspecified site: Secondary | ICD-10-CM | POA: Diagnosis not present

## 2023-11-02 DIAGNOSIS — I509 Heart failure, unspecified: Secondary | ICD-10-CM | POA: Diagnosis not present

## 2023-11-02 DIAGNOSIS — I25110 Atherosclerotic heart disease of native coronary artery with unstable angina pectoris: Secondary | ICD-10-CM | POA: Insufficient documentation

## 2023-11-02 DIAGNOSIS — Z7901 Long term (current) use of anticoagulants: Secondary | ICD-10-CM | POA: Insufficient documentation

## 2023-11-02 DIAGNOSIS — I429 Cardiomyopathy, unspecified: Secondary | ICD-10-CM | POA: Diagnosis not present

## 2023-11-02 DIAGNOSIS — I10 Essential (primary) hypertension: Secondary | ICD-10-CM | POA: Diagnosis present

## 2023-11-02 LAB — CBC
HCT: 35.9 % — ABNORMAL LOW (ref 36.0–46.0)
HCT: 36.4 % (ref 36.0–46.0)
Hemoglobin: 11.2 g/dL — ABNORMAL LOW (ref 12.0–15.0)
Hemoglobin: 11.4 g/dL — ABNORMAL LOW (ref 12.0–15.0)
MCH: 31.1 pg (ref 26.0–34.0)
MCH: 30.8 pg (ref 26.0–34.0)
MCHC: 31.2 g/dL (ref 30.0–36.0)
MCHC: 31.3 g/dL (ref 30.0–36.0)
MCV: 99.7 fL (ref 80.0–100.0)
MCV: 98.4 fL (ref 80.0–100.0)
Platelets: 220 10*3/uL (ref 150–400)
Platelets: 236 10*3/uL (ref 150–400)
RBC: 3.6 MIL/uL — ABNORMAL LOW (ref 3.87–5.11)
RBC: 3.7 MIL/uL — ABNORMAL LOW (ref 3.87–5.11)
RDW: 13.7 % (ref 11.5–15.5)
RDW: 13.6 % (ref 11.5–15.5)
WBC: 7.7 10*3/uL (ref 4.0–10.5)
WBC: 5.8 10*3/uL (ref 4.0–10.5)
nRBC: 0 % (ref 0.0–0.2)
nRBC: 0 % (ref 0.0–0.2)

## 2023-11-02 LAB — BASIC METABOLIC PANEL
Anion gap: 10 (ref 5–15)
BUN: 20 mg/dL (ref 8–23)
CO2: 24 mmol/L (ref 22–32)
Calcium: 9.2 mg/dL (ref 8.9–10.3)
Chloride: 102 mmol/L (ref 98–111)
Creatinine, Ser: 1.52 mg/dL — ABNORMAL HIGH (ref 0.44–1.00)
GFR, Estimated: 34 mL/min — ABNORMAL LOW (ref >60)
Glucose, Bld: 171 mg/dL — ABNORMAL HIGH (ref 70–99)
Potassium: 3.9 mmol/L (ref 3.5–5.1)
Sodium: 136 mmol/L (ref 135–145)

## 2023-11-02 LAB — TROPONIN I (HIGH SENSITIVITY)
Troponin I (High Sensitivity): 4 ng/L (ref <18)
Troponin I (High Sensitivity): 6 ng/L (ref <18)

## 2023-11-02 LAB — PROTIME-INR
INR: 1.2 (ref 0.8–1.2)
Prothrombin Time: 14.9 s (ref 11.4–15.2)

## 2023-11-02 LAB — APTT: aPTT: 65 s — ABNORMAL HIGH (ref 24–36)

## 2023-11-02 MED ORDER — APIXABAN 2.5 MG PO TABS: 2.5000 mg | ORAL_TABLET | Freq: Two times a day (BID) | ORAL | Status: DC

## 2023-11-02 MED ORDER — PANTOPRAZOLE SODIUM 40 MG PO TBEC
40.0000 mg | DELAYED_RELEASE_TABLET | Freq: Every day | ORAL | Status: DC
  Administered 2023-11-02 – 2023-11-03 (×2): 40 mg via ORAL
  Filled 2023-11-02 (×2): qty 1

## 2023-11-02 MED ORDER — ROSUVASTATIN CALCIUM 20 MG PO TABS
40.0000 mg | ORAL_TABLET | Freq: Every day | ORAL | Status: DC
  Administered 2023-11-02: 40 mg via ORAL
  Filled 2023-11-02: qty 2

## 2023-11-02 MED ORDER — CARVEDILOL 3.125 MG PO TABS
3.1250 mg | ORAL_TABLET | Freq: Two times a day (BID) | ORAL | Status: DC
  Administered 2023-11-02: 3.125 mg via ORAL
  Filled 2023-11-02: qty 1

## 2023-11-02 MED ORDER — ALUM & MAG HYDROXIDE-SIMETH 200-200-20 MG/5ML PO SUSP
30.0000 mL | Freq: Once | ORAL | Status: AC
  Administered 2023-11-02: 30 mL via ORAL
  Filled 2023-11-02: qty 30

## 2023-11-02 MED ORDER — EZETIMIBE 10 MG PO TABS
10.0000 mg | ORAL_TABLET | Freq: Every day | ORAL | Status: DC
  Administered 2023-11-02 – 2023-11-03 (×2): 10 mg via ORAL
  Filled 2023-11-02 (×2): qty 1

## 2023-11-02 MED ORDER — HEPARIN (PORCINE) 25000 UT/250ML-% IV SOLN
1150.0000 [IU]/h | INTRAVENOUS | Status: DC
  Administered 2023-11-02: 1000 [IU]/h via INTRAVENOUS
  Filled 2023-11-02: qty 250

## 2023-11-02 MED ORDER — HYDRALAZINE HCL 50 MG PO TABS
100.0000 mg | ORAL_TABLET | Freq: Three times a day (TID) | ORAL | Status: DC
  Administered 2023-11-02 – 2023-11-03 (×4): 100 mg via ORAL
  Filled 2023-11-02 (×4): qty 2

## 2023-11-02 MED ORDER — LIDOCAINE VISCOUS HCL 2 % MT SOLN
15.0000 mL | Freq: Once | OROMUCOSAL | Status: AC
  Administered 2023-11-02: 15 mL via OROMUCOSAL
  Filled 2023-11-02: qty 15

## 2023-11-02 MED ORDER — ACETAMINOPHEN 325 MG PO TABS: 650.0000 mg | ORAL_TABLET | ORAL | Status: DC | PRN

## 2023-11-02 MED ORDER — CARVEDILOL 6.25 MG PO TABS
6.2500 mg | ORAL_TABLET | Freq: Two times a day (BID) | ORAL | Status: DC
  Administered 2023-11-02 – 2023-11-03 (×2): 6.25 mg via ORAL
  Filled 2023-11-02 (×2): qty 1

## 2023-11-02 MED ORDER — DAPAGLIFLOZIN PROPANEDIOL 10 MG PO TABS
10.0000 mg | ORAL_TABLET | Freq: Every day | ORAL | Status: DC
  Administered 2023-11-02 – 2023-11-03 (×2): 10 mg via ORAL
  Filled 2023-11-02 (×2): qty 1

## 2023-11-02 MED ORDER — ENSURE ENLIVE PO LIQD
237.0000 mL | Freq: Two times a day (BID) | ORAL | Status: DC
  Administered 2023-11-02 – 2023-11-03 (×2): 237 mL via ORAL

## 2023-11-02 MED ORDER — ISOSORBIDE MONONITRATE ER 30 MG PO TB24
30.0000 mg | ORAL_TABLET | Freq: Every day | ORAL | Status: DC
  Administered 2023-11-02 – 2023-11-03 (×2): 30 mg via ORAL
  Filled 2023-11-02 (×2): qty 1

## 2023-11-02 MED ORDER — APIXABAN 2.5 MG PO TABS
2.5000 mg | ORAL_TABLET | Freq: Two times a day (BID) | ORAL | Status: DC
  Administered 2023-11-02 – 2023-11-03 (×3): 2.5 mg via ORAL
  Filled 2023-11-02 (×3): qty 1

## 2023-11-02 MED ORDER — MORPHINE SULFATE (PF) 2 MG/ML IV SOLN: 2.0000 mg | INTRAVENOUS | Status: DC | PRN

## 2023-11-02 MED ORDER — ONDANSETRON HCL 4 MG/2ML IJ SOLN: 4.0000 mg | Freq: Four times a day (QID) | INTRAMUSCULAR | Status: DC | PRN

## 2023-11-02 MED ORDER — SACUBITRIL-VALSARTAN 49-51 MG PO TABS
1.0000 | ORAL_TABLET | Freq: Two times a day (BID) | ORAL | Status: DC
Start: 2023-11-02 — End: 2023-11-03
  Administered 2023-11-02 – 2023-11-03 (×3): 1 via ORAL
  Filled 2023-11-02 (×3): qty 1

## 2023-11-02 MED ORDER — FENTANYL CITRATE PF 50 MCG/ML IJ SOSY
50.0000 ug | PREFILLED_SYRINGE | Freq: Once | INTRAMUSCULAR | Status: AC
  Administered 2023-11-02: 50 ug via INTRAVENOUS
  Filled 2023-11-02: qty 1

## 2023-11-02 MED ORDER — NITROGLYCERIN IN D5W 200-5 MCG/ML-% IV SOLN
0.0000 ug/min | INTRAVENOUS | Status: DC
  Administered 2023-11-02: 5 ug/min via INTRAVENOUS
  Filled 2023-11-02: qty 250

## 2023-11-02 NOTE — Assessment & Plan Note (Signed)
Creat 1.5 today, at patients baseline

## 2023-11-02 NOTE — Consult Note (Addendum)
Cardiology Consultation   Patient ID: Kristin [name] MRN: [id number]; DOB: [birth year]  Admit date: 11/02/2023 Date of Consult: 11/02/2023  PCP:  Aliene Beams, MD   [facility] Providers Cardiologist:  Tessa Lerner, DO   {  Patient Profile:   Kristin [name] is a 82 y.o. female with a hx of CAD (s/p NSTEMI in 08/2020 with cath showing occluded LAD with medical management pursued due to recent spinal surgery, cath in 05/2021 showing 95% mid LAD stenosis and 90% ostial RCA stenosis and underwent staged PCI with DES to the mid-proximal LAD and medical management recommended of residual RCA disease), chronic HFmrEF (EF 30-35% in 08/2020, at 45-50% in 05/2021 and 40-45% in 06/2021), paroxysmal atrial fibrillation, HTN, HLD, Type 2 DM, Stage 3-4 CKD and prior CVA who is being seen 11/02/2023 for the evaluation of chest pain at the request of Dr. Julian Reil.  History of Present Illness:   [name] was most recently examined by Robin Searing, NP on 10/10/2023 and denied any recent chest pain or palpitations at that time. She appeared euvolemic on examination and her weight was stable at 181 lbs. She was continued on her current cardiac medications including Eliquis 2.5 mg twice daily, Coreg 3.125 mg twice daily, Farxiga 10 mg daily, Zetia 10 mg daily, Hydralazine 100 mg 3 times daily, Imdur 120 mg daily, Crestor 40 mg daily and Entresto 49-51 mg twice daily.  She presented to [facility] ED this morning for evaluation of chest pain and shortness of breath which started to occur several hours prior to arrival. Initial labs showed WBC 7.7, Hgb 11.2, platelets 220, Na+ 136, K+ 3.9 and creatinine 1.52 (close to baseline). Initial and repeat Hs Troponin values negative at 4 and 6. CXR with no active disease. EKG shows NSR, HR 72 with anterior infarct pattern and no acute ST changes when compared to prior tracings. She was started on IV Heparin (due to Eliquis being held) and IV nitroglycerin along  with receiving a GI cocktail.  In talking with the patient today, she reports being in her normal state of health until last night when she developed chest pain upon lying down to go to sleep. Pain started at approximately 2100 and lasted for 5-6+ hours. Pain was not worse with exertion or positional changes. Reports it felt like a pressure in her chest but did not radiate. She received a GI cocktail and was started on IV Nitroglycerin around the same timeframe and reports her pain resolved. No recurrent pain this morning. She feels back to baseline and is anxious to go home. She denies any specific dyspnea on exertion, orthopnea, PND or pitting edema. She does try to monitor her sodium intake closely.  Past Medical History:  Diagnosis Date   Atrial fibrillation (HCC)    CHF (congestive heart failure) (HCC)    Coronary artery disease    Diabetes mellitus without complication (HCC)    Hypercholesteremia    Hypertension    Renal disorder    CKD Stage IV   Stroke [facility])     Past Surgical History:  Procedure Laterality Date   CORONARY STENT INTERVENTION N/A 05/15/2021   Procedure: CORONARY STENT INTERVENTION;  Surgeon: Elder Negus, MD;  Location: [facility] INVASIVE CV LAB;  Service: Cardiovascular;  Laterality: N/A;   LEFT HEART CATH AND CORONARY ANGIOGRAPHY N/A 05/11/2021   Procedure: LEFT HEART CATH AND CORONARY ANGIOGRAPHY;  Surgeon: Elder Negus, MD;  Location: [facility] INVASIVE CV LAB;  Service: Cardiovascular;  Laterality: N/A;  LUMBAR LAMINECTOMY/DECOMPRESSION MICRODISCECTOMY N/A 08/19/2020   Procedure: LUMBAR LAMINECTOMY/DECOMPRESSION  Lumbar two-three, Lumbar three-four;  Surgeon: Dawley, Alan Mulder, DO;  Location: [facility] OR;  Service: Neurosurgery;  Laterality: N/A;   RIGHT/LEFT HEART CATH AND CORONARY ANGIOGRAPHY N/A 08/23/2020   Procedure: RIGHT/LEFT HEART CATH AND CORONARY ANGIOGRAPHY;  Surgeon: Yates Decamp, MD;  Location: [facility] INVASIVE CV LAB;  Service: Cardiovascular;  Laterality: N/A;      Home Medications:  Prior to Admission medications   Medication Sig Start Date End Date Taking? Authorizing Provider  apixaban (ELIQUIS) 2.5 MG TABS tablet Take 1 tablet (2.5 mg total) by mouth 2 (two) times daily. 10/10/23  Yes Gaston Islam., NP  carvedilol (COREG) 3.125 MG tablet Take 1 tablet (3.125 mg total) by mouth 2 (two) times daily with a meal. 10/10/23  Yes Gaston Islam., NP  cholecalciferol (VITAMIN D) 25 MCG (1000 UNIT) tablet Take 2,000 Units by mouth daily.   Yes [provider]  dapagliflozin propanediol (FARXIGA) 10 MG TABS tablet Take 1 tablet (10 mg total) by mouth daily before breakfast. 10/10/23  Yes Gaston Islam., NP  diclofenac Sodium (VOLTAREN) 1 % GEL Apply 2 g topically 4 (four) times daily. 08/30/20  Yes Glade Lloyd, MD  ezetimibe (ZETIA) 10 MG tablet Take 1 tablet by mouth once daily 10/06/23  Yes Tolia, Sunit, DO  hydrALAZINE (APRESOLINE) 100 MG tablet Take 1 tablet (100 mg total) by mouth 3 (three) times daily. 10/10/23  Yes Gaston Islam., NP  isosorbide mononitrate (IMDUR) 120 MG 24 hr tablet Take 1 tablet (120 mg total) by mouth daily. 10/10/23  Yes Gaston Islam., NP  nitroGLYCERIN (NITROSTAT) 0.4 MG SL tablet Place 1 tablet (0.4 mg total) under the tongue every 5 (five) minutes as needed for up to 30 doses for chest pain. 08/02/22  Yes Tolia, Sunit, DO  pantoprazole (PROTONIX) 40 MG tablet Take 1 tablet (40 mg total) by mouth daily. 08/30/20  Yes Glade Lloyd, MD  rosuvastatin (CRESTOR) 40 MG tablet Take 1 tablet (40 mg total) by mouth at bedtime. 10/10/23  Yes Gaston Islam., NP  sacubitril-valsartan (ENTRESTO) 49-51 MG Take 1 tablet by mouth 2 (two) times daily. 10/10/23  Yes Gaston Islam., NP  colchicine 0.6 MG tablet Take 0.5 tablets (0.3 mg total) by mouth daily. 08/31/20   Glade Lloyd, MD  cyclobenzaprine (FLEXERIL) 10 MG tablet Take 0.5 tablets (5 mg total) by mouth 2 (two) times daily as needed for muscle spasms.  09/02/23   Elpidio Anis, PA-C  HYDROcodone-acetaminophen (NORCO) 5-325 MG tablet Take 1 tablet by mouth every 4 (four) hours as needed for moderate pain. 09/02/23   Elpidio Anis, PA-C    Inpatient Medications: Scheduled Meds:  carvedilol  3.125 mg Oral BID WC   dapagliflozin propanediol  10 mg Oral QAC breakfast   ezetimibe  10 mg Oral Daily   hydrALAZINE  100 mg Oral TID   pantoprazole  40 mg Oral Daily   rosuvastatin  40 mg Oral QHS   sacubitril-valsartan  1 tablet Oral BID   Continuous Infusions:  heparin 1,000 Units/hr (11/02/23 0454)   nitroGLYCERIN 35 mcg/min (11/02/23 0524)   PRN Meds: acetaminophen, morphine injection, ondansetron (ZOFRAN) IV  Allergies:    Allergies  Allergen Reactions   Tramadol Nausea And Vomiting    Social History:   Social History   Socioeconomic History   Marital status: Widowed    Spouse name: Not on file  Number of children: 4   Years of education: Not on file   Highest education level: Not on file  Occupational History   Not on file  Tobacco Use   Smoking status: Never   Smokeless tobacco: Never  Vaping Use   Vaping status: Never Used  Substance and Sexual Activity   Alcohol use: Never   Drug use: Never   Sexual activity: Not on file  Other Topics Concern   Not on file  Social History Narrative   Not on file    Family History:    Family History  Problem Relation Age of Onset   Diabetes Father    Heart attack Father    Heart disease Mother    Hypertension Sister    Hypertension Sister    Hypertension Sister    Hypertension Sister    Hypertension Sister    Hypertension Sister    Hypertension Sister    Hypertension Sister    Hypertension Sister    Hypertension Sister    Hypertension Sister    Hypertension Sister      ROS:  Please see the history of present illness.   All other ROS reviewed and negative.     Physical Exam/Data:   Vitals:   11/02/23 0530 11/02/23 0537 11/02/23 0624 11/02/23 0809  BP:  (!) 155/79 (!) 144/82 (!) 147/78 (!) 169/89  Pulse: 67 67 (!) 106 73  Resp: 16 14 20  (!) 21  Temp:  97.9 F (36.6 C) 97.8 F (36.6 C) 98.5 F (36.9 C)  TempSrc:  Oral Oral Oral  SpO2: 98% 100% 93% 96%  Weight:   82.7 kg   Height:   5\' 4"  (1.626 m)    No intake or output data in the 24 hours ending 11/02/23 0901    11/02/2023    6:24 AM 11/02/2023    2:25 AM 10/10/2023    9:44 AM  Last 3 Weights  Weight (lbs) 182 lb 6.4 oz 182 lb 15.7 oz 181 lb 12.8 oz  Weight (kg) 82.736 kg 83 kg 82.464 kg     Body mass index is 31.31 kg/m.  General: Pleasant female appearing in no acute distress. HEENT: normal Neck: no JVD Vascular: No carotid bruits; Distal pulses 2+ bilaterally Cardiac:  normal S1, S2; RRR; no murmur  Lungs:  clear to auscultation bilaterally, no wheezing, rhonchi or rales  Abd: soft, nontender, no hepatomegaly  Ext: no pitting edema Musculoskeletal:  No deformities, BUE and BLE strength normal and equal Skin: warm and dry  Neuro:  CNs 2-12 intact, no focal abnormalities noted Psych:  Normal affect   EKG:  The EKG was personally reviewed and demonstrates: NSR, HR 72 with anterior infarct pattern and no acute ST changes when compared to prior tracings.  Telemetry:  Telemetry was personally reviewed and demonstrates: Normal sinus rhythm, heart rate in 70's to 80's with occasional missed beats.  Relevant CV Studies:   Cardiac Catheterization: 05/2021 LM: Normal LAD: Prox 60% stenosis        Mid 95% stenosis just prox to large bifurcating diag        Diag ostium does not seem to be involved.        (Prior cath in 08/2020 noted prox 80% stenosis, followed by long mid to apical LAD occlusion, which seems to have recanalized) LCx: No significant disease RCA: Ostial 90% stenosis         (Significant dampening of pressure noted)         RPL  60% stenosis RCA and LCx system seems largely unchanged compared to 08/2020   Two vessel CAD (Prox-mid LAD and ostial RCA) Elevated  LVEDP 29 mmHg   Given acute HFrEF, recommend staged revascularization. Options include CABG (Grafts to LAD, Diag, and RCA) vs PCI to LAD and RCA. Recommend medical management of heart failure over the weekend, followed by revascularization.  Coronary Stent Intervention: 05/2021 Prox-mid LAD 60-99% stenosis   Successful percutaneous coronary intervention mid-prox LAD PTCA and stent placement 3.5 X 32 mm Synergy drug-eluting stent Proximal post dilatation using 4.0X15 mm balloon at 22 atm   99-->0% residual stenosis TIMI flow I-->III   Patient is also on eliquis for Afib Recommend Aspirin, plavix, and eliquis for 1 month After that, discontinue Aspirin and continue plavix and eliquis for at least 1 year After 1 year, could consider stopping plavix   Recommend symptoms guided management for ostial RCA stenosis Continue GDMT for HFrEF  Echocardiogram: 06/2021 Left ventricle cavity is normal in size. Mild concentric hypertrophy of  the left ventricle. Mid to distal anteroseptal/anterior mild hypokinesis.  Mild global hypokinesis. LVEF 40-45%. Doppler evidence of grade II  (pseudonormal) diastolic dysfunction, elevated LAP.  Left atrial cavity is moderately dilated.  Moderate (Grade II) mitral regurgitation. Mildly restricted mitral valve  leaflets.  Moderate tricuspid regurgitation. Moderate pulmonary hypertension.  Estimated pulmonary artery systolic pressure 59 mmHg.  Mild pulmonic regurgitation.  Previous study on 05/11/2021 noted akinetic mid-apical anterior wall,  anteroseptal wall, and apex. LVEF 45-50%, no pulmonary hypertension.    Laboratory Data:  High Sensitivity Troponin:   Recent Labs  Lab 11/02/23 0228 11/02/23 0430  TROPONINIHS 4 6     Chemistry Recent Labs  Lab 11/02/23 0228  NA 136  K 3.9  CL 102  CO2 24  GLUCOSE 171*  BUN 20  CREATININE 1.52*  CALCIUM 9.2  GFRNONAA 34*  ANIONGAP 10    No results for input(s): "PROT", "ALBUMIN", "AST", "ALT",  "ALKPHOS", "BILITOT" in the last 168 hours. Lipids No results for input(s): "CHOL", "TRIG", "HDL", "LABVLDL", "LDLCALC", "CHOLHDL" in the last 168 hours.  Hematology Recent Labs  Lab 11/02/23 0228 11/02/23 0806  WBC 7.7 5.8  RBC 3.60* 3.70*  HGB 11.2* 11.4*  HCT 35.9* 36.4  MCV 99.7 98.4  MCH 31.1 30.8  MCHC 31.2 31.3  RDW 13.7 13.6  PLT 220 236   Thyroid No results for input(s): "TSH", "FREET4" in the last 168 hours.  BNPNo results for input(s): "BNP", "PROBNP" in the last 168 hours.  DDimer No results for input(s): "DDIMER" in the last 168 hours.   Radiology/Studies:  DG Chest Portable 1 View  Result Date: 11/02/2023 CLINICAL DATA:  Chest pain. EXAM: PORTABLE CHEST 1 VIEW COMPARISON:  04/28/2023. FINDINGS: The heart size and mediastinal contours are within normal limits. There is atherosclerotic calcification of the aorta. Chronic elevation of the right diaphragm is noted. Minimal subsegmental atelectasis or scarring is noted in the mid left lung. No consolidation, effusion, or pneumothorax. No acute osseous abnormality is seen. IMPRESSION: No active disease. Electronically Signed   By: Thornell Sartorius M.D.   On: 11/02/2023 02:38     Assessment and Plan:   1. Chest Pain with Atypical Features - She presented with chest pain which started upon going to bed last night and pain lasted for 5-6+ hours and was not associated with exertion or positional changes. Pain resolved upon receiving a GI cocktail and being started on IV NTG at the same time. She has been pain-free  since.  - Hs troponin values have been negative and EKG is without acute ST changes. She is currently on IV Nitroglycerin at 35 mcg/min will ask her nurse to wean this. Would restart PTA Imdur at discharge. If she remains pain-free, could likely be discharged later today as her pain overall seems atypical for a cardiac etiology. She is on Protonix 40 mg daily as an outpatient and dosing may need to be adjusted at the time  of discharge.  2. CAD - She is s/p NSTEMI in 08/2020 with cath showing occluded LAD with medical management pursued due to recent spinal surgery, cath in 05/2021 showing 95% mid LAD stenosis and 90% ostial RCA stenosis and underwent staged PCI with DES to the mid-proximal LAD and medical management recommended of residual RCA disease. - Troponin values have been negative and EKG is without acute ST changes. She has been continued on Coreg 3.125 mg twice daily, Zetia 10mg  daily and Crestor 40mg  daily. She is on Imdur 120mg  daily as an outpatient and would restart at discharge.  3. Chronic HFmrEF - Her EF was previously 30-35% in 08/2020, at 45-50% in 05/2021 and 40-45% in 06/2021. She appears to be euvolemic by examination today and denies any recent respiratory issue. Would recommend a follow-up echocardiogram as an outpatient given the timeframe since last evaluation. - Continue Coreg 3.125 mg twice daily, Farxiga 10 mg daily, Hydralazine 100 mg 3 times daily and Entresto 49-51 mg twice daily.  4. Paroxysmal Atrial Fibrillation - She is maintaining normal sinus rhythm at this time. Continue Coreg 3.125 mg twice daily for rate control. - She is on Eliquis 2.5 mg twice daily as an outpatient which is the appropriate dose given her age, weight and renal function. Was bridged with IV Heparin at the time of admission in case she required invasive procedures.  5. Mitral Regurgitation - This was moderate by echo in 2022. Can obtain follow-up imaging as an outpatient.   6.  HTN - BP was elevated at 169/89 on most recent check but she did just receive her morning medications. Would continue to follow this and will wean IV nitroglycerin as discussed above.  6. HLD - LDL was at 56 and 03/2023. Continue Crestor 40 mg daily and Zetia 10 mg daily.  7. Stage 3-4 CKD - Her creatinine is at 1.52 on admission which is close to her known baseline.   Risk Assessment/Risk Scores:   For questions or updates,  please contact [facility] Please consult [email or web address] for contact info under   Signed, Ellsworth Lennox, PA-C  11/02/2023 9:01 AM   Patient seen and examined, note reviewed with the signed Advanced Practice Provider. I personally reviewed laboratory data, imaging studies and relevant notes. I independently examined the patient and formulated the important aspects of the plan. I have personally discussed the plan with the patient and/or family. Comments or changes to the note/plan are indicated below.  This is a 82 year old woman who presented for chest pain.   Coronary artery disease with atypical chest pain-tells me that her pain relieved in the ED after she was given a GI cocktail. Chronic heart failure with midrange ejection fraction Paroxysmal atrial fibrillation Mitral regurgitation Hypertension Hyperlipidemia Chronic kidney disease stage III   She is currently chest pain-free.  She says since she was in the ED and received/during the GI cocktail she has not had any more chest pain.  She was placed on heparin drip as well as nitro drip. It is safe  to transition the patient off of the nitro drip.  I will place her on Imdur 30 mg daily. We can transition the patient off of heparin drip.  In the meantime she is hypertensive so I will increase her Coreg to 6.25 mg twice daily.  In along with the nitro hopefully that we will be able to control her blood pressure and keep her below target.  Continue her lipid-lowering regimen with Crestor 40 mg daily and Zetia 10 mg daily. It will be beneficial to keep the patient in the hospital overnight and then consider discharge tomorrow given her current clinical state.    Thomasene Ripple DO, MS [facility] Attending Cardiologist [facility]  [address] [place], [place] [address] [phone] Website: [email or web address]

## 2023-11-02 NOTE — Progress Notes (Signed)
PROGRESS NOTE    Kristin [name]  RUE:[id number] DOB: 1941-04-14 DOA: 11/02/2023 PCP: Aliene Beams, MD  Chief Complaint  Patient presents with   Chest Pain    Patient to ED via EMS with complaint of chest pain x5 hours. Patient reports having an MI in 2021. EMS reports patient has had 4 SL nitroglycerin tablets . Patient refused ASA stating it will interfere with other meds she is taking according to EMS. Patient reports nausea.    Brief Narrative:   Kristin [name] is Kristin [name] 82 y.o. female with medical history significant of Tinsley Everman.fib on eliquis, CAD s/p stent, CKD 3, HTN, HLD, stroke, ICM with EF 40-45%.   Pt had NSTEMI in 2021, didn't get stents until 9 months later in 2022 due to being post op from [facility] and not candidate for [facility] at the time in 2021.   CP in 06/2022 with neg trops, felt to be maybe GI related it seems?   Hadnt had CP since that time in 2023 until woke from sleep this AM with onset of CP.  Pressure like sensation, radiates to back.  8/10, feels like prior MI.  No associated SOB, nausea vomiting.  No tearing sensation.  Admitted for CP rule out.   Assessment & Plan:   Principal Problem:   Chest pain, rule out acute myocardial infarction Active Problems:   Status post coronary artery stent placement   Cardiomyopathy (HCC)   Hypertension   Atrial fibrillation (HCC)   CKD stage 3b, GFR 30-44 ml/min (HCC)  Chest Pain rule out CAD s/p stent 05/2021 Appreciate cardiology assistance - d/c heparin gtt and nitroglycerin gtt.  Starting imdur and increasing coreg in setting of hypertension.  Her symptoms improved with GI cocktail. Crestor  HFmrEF EF 45-50% 06/2021 Entresto, farxiga, coreg Euvolemic  CKD IIIb Continue to monitor  Atrial Fibrillation Will resume eliquis   Hypertension Coreg (increased dose), hydralazine, imdur, entresto     DVT prophylaxis: heparin -> eliquis Code Status: full Family Communication: none at bedside Disposition:   Status  is: Observation The patient remains OBS appropriate and will d/c before 2 midnights.   Consultants:  cardiology  Procedures:  none  Antimicrobials:  Anti-infectives (From admission, onward)    None       Subjective: CP resolved - was relieved by GI cocktail  Described as central, pressure, constant  Objective: Vitals:   11/02/23 0624 11/02/23 0809 11/02/23 1045 11/02/23 1234  BP: (!) 147/78 (!) 169/89 (!) 149/99 131/80  Pulse: (!) 106 73    Resp: 20 (!) 21 (!) 22 17  Temp: 97.8 F (36.6 C) 98.5 F (36.9 C)    TempSrc: Oral Oral    SpO2: 93% 96%    Weight: 82.7 kg     Height: 5\' 4"  (1.626 m)       Intake/Output Summary (Last 24 hours) at 11/02/2023 1311 Last data filed at 11/02/2023 1236 Gross per 24 hour  Intake 480 ml  Output --  Net 480 ml   Filed Weights   11/02/23 0225 11/02/23 0624  Weight: 83 kg 82.7 kg    Examination:  General exam: Appears calm and comfortable  Respiratory system: unlabored Cardiovascular system: RRR - no CP to palpation  Central nervous system: Alert and oriented. No focal neurological deficits. Extremities: no LEE    Data Reviewed: I have personally reviewed following labs and imaging studies  CBC: Recent Labs  Lab 11/02/23 0228 11/02/23 0806  WBC 7.7 5.8  HGB 11.2* 11.4*  HCT 35.9* 36.4  MCV 99.7 98.4  PLT 220 236    Basic Metabolic Panel: Recent Labs  Lab 11/02/23 0228  NA 136  K 3.9  CL 102  CO2 24  GLUCOSE 171*  BUN 20  CREATININE 1.52*  CALCIUM 9.2    GFR: Estimated Creatinine Clearance: 29.2 mL/min ([place]) (by C-G formula based on SCr of 1.52 mg/dL (H)).  Liver Function Tests: No results for input(s): "AST", "ALT", "ALKPHOS", "BILITOT", "PROT", "ALBUMIN" in the last 168 hours.  CBG: No results for input(s): "GLUCAP" in the last 168 hours.   No results found for this or any previous visit (from the past 240 hour(s)).       Radiology Studies: DG Chest Portable 1 View  Result Date:  11/02/2023 CLINICAL DATA:  Chest pain. EXAM: PORTABLE CHEST 1 VIEW COMPARISON:  04/28/2023. FINDINGS: The heart size and mediastinal contours are within normal limits. There is atherosclerotic calcification of the aorta. Chronic elevation of the right diaphragm is noted. Minimal subsegmental atelectasis or scarring is noted in the mid left lung. No consolidation, effusion, or pneumothorax. No acute osseous abnormality is seen. IMPRESSION: No active disease. Electronically Signed   By: Thornell Sartorius M.D.   On: 11/02/2023 02:38        Scheduled Meds:  carvedilol  6.25 mg Oral BID WC   dapagliflozin propanediol  10 mg Oral QAC breakfast   ezetimibe  10 mg Oral Daily   feeding supplement  237 mL Oral BID BM   hydrALAZINE  100 mg Oral TID   isosorbide mononitrate  30 mg Oral Daily   pantoprazole  40 mg Oral Daily   rosuvastatin  40 mg Oral QHS   sacubitril-valsartan  1 tablet Oral BID   Continuous Infusions:   LOS: 0 days    Time spent: over 30 min     Lacretia Nicks, MD [facility]   To contact the attending provider between 7A-7P or the covering provider during after hours 7P-7A, please log into the web site [email or web address] and access using universal [place] password for that web site. If you do not have the password, please call the hospital operator.  11/02/2023, 1:11 PM

## 2023-11-02 NOTE — ED Provider Notes (Signed)
[place] EMERGENCY DEPARTMENT AT [facility] Provider Note   CSN: [id number] Arrival date & time: 11/02/23  0221     History  Chief Complaint  Patient presents with   Chest Pain    Patient to ED via EMS with complaint of chest pain x5 hours. Patient reports having an MI in 2021. EMS reports patient has had 4 SL nitroglycerin tablets . Patient refused ASA stating it will interfere with other meds she is taking according to EMS. Patient reports nausea.    Tonette Butch is a 82 y.o. female.  The history is provided by the patient and the EMS personnel.  Patient history of A-fib on Eliquis, CHF, CAD, diabetes, chronic kidney disease, previous CVA presents with chest pain.  Patient reports she started having substernal chest pain and pressure several hours ago.  It woke her up from sleep.  It radiates to her back.  No vomiting, no diaphoresis or shortness of breath.  No focal arm or leg weakness or numbness.  Reports this is similar to previous MI  Patient did not take aspirin due to already on anticoagulation.  She took a total of 4 nitroglycerin without relief.  She also received morphine per EMS  Last dose of Eliquis was last night Past Medical History:  Diagnosis Date   Atrial fibrillation (HCC)    CHF (congestive heart failure) (HCC)    Coronary artery disease    Diabetes mellitus without complication (HCC)    Hypercholesteremia    Hypertension    Renal disorder    CKD Stage IV   Stroke (HCC)     Home Medications Prior to Admission medications   Medication Sig Start Date End Date Taking? Authorizing Provider  apixaban (ELIQUIS) 2.5 MG TABS tablet Take 1 tablet (2.5 mg total) by mouth 2 (two) times daily. 10/10/23  Yes Gaston Islam., NP  carvedilol (COREG) 3.125 MG tablet Take 1 tablet (3.125 mg total) by mouth 2 (two) times daily with a meal. 10/10/23  Yes Gaston Islam., NP  cholecalciferol (VITAMIN D) 25 MCG (1000 UNIT) tablet Take 2,000 Units by mouth  daily.   Yes [provider]  dapagliflozin propanediol (FARXIGA) 10 MG TABS tablet Take 1 tablet (10 mg total) by mouth daily before breakfast. 10/10/23  Yes Gaston Islam., NP  diclofenac Sodium (VOLTAREN) 1 % GEL Apply 2 g topically 4 (four) times daily. 08/30/20  Yes Glade Lloyd, MD  ezetimibe (ZETIA) 10 MG tablet Take 1 tablet by mouth once daily 10/06/23  Yes Tolia, Sunit, DO  hydrALAZINE (APRESOLINE) 100 MG tablet Take 1 tablet (100 mg total) by mouth 3 (three) times daily. 10/10/23  Yes Gaston Islam., NP  isosorbide mononitrate (IMDUR) 120 MG 24 hr tablet Take 1 tablet (120 mg total) by mouth daily. 10/10/23  Yes Gaston Islam., NP  nitroGLYCERIN (NITROSTAT) 0.4 MG SL tablet Place 1 tablet (0.4 mg total) under the tongue every 5 (five) minutes as needed for up to 30 doses for chest pain. 08/02/22  Yes Tolia, Sunit, DO  pantoprazole (PROTONIX) 40 MG tablet Take 1 tablet (40 mg total) by mouth daily. 08/30/20  Yes Glade Lloyd, MD  rosuvastatin (CRESTOR) 40 MG tablet Take 1 tablet (40 mg total) by mouth at bedtime. 10/10/23  Yes Gaston Islam., NP  sacubitril-valsartan (ENTRESTO) 49-51 MG Take 1 tablet by mouth 2 (two) times daily. 10/10/23  Yes Gaston Islam., NP  colchicine 0.6 MG tablet  Take 0.5 tablets (0.3 mg total) by mouth daily. 08/31/20   Glade Lloyd, MD  cyclobenzaprine (FLEXERIL) 10 MG tablet Take 0.5 tablets (5 mg total) by mouth 2 (two) times daily as needed for muscle spasms. 09/02/23   Elpidio Anis, PA-C  HYDROcodone-acetaminophen (NORCO) 5-325 MG tablet Take 1 tablet by mouth every 4 (four) hours as needed for moderate pain. 09/02/23   Elpidio Anis, PA-C      Allergies    Tramadol    Review of Systems   Review of Systems  Constitutional:  Negative for diaphoresis.  Respiratory:  Negative for shortness of breath.   Cardiovascular:  Positive for chest pain.  Neurological:  Negative for weakness and numbness.    Physical Exam Updated Vital  Signs BP 131/69   Pulse 62   Temp 98.7 F (37.1 C) (Oral)   Resp 11   Ht 1.626 m (5\' 4" )   Wt 83 kg   SpO2 97%   BMI 31.41 kg/m  Physical Exam CONSTITUTIONAL: Elderly, no acute distress HEAD: Normocephalic/atraumatic EYES: EOMI/PERRL ENMT: Mucous membranes moist NECK: supple no meningeal signs CV: S1/S2 noted, no murmurs/rubs/gallops noted LUNGS: Lungs are clear to auscultation bilaterally, no apparent distress ABDOMEN: soft, nontender NEURO: Pt is awake/alert/appropriate, moves all extremitiesx4.  No facial droop.  No arm or leg drift. EXTREMITIES: pulses normal/equalx4, full ROM SKIN: warm, color normal PSYCH: Flat affect  ED Results / Procedures / Treatments   Labs (all labs ordered are listed, but only abnormal results are displayed) Labs Reviewed  BASIC METABOLIC PANEL - Abnormal; Notable for the following components:      Result Value   Glucose, Bld 171 (*)    Creatinine, Ser 1.52 (*)    GFR, Estimated 34 (*)    All other components within normal limits  CBC - Abnormal; Notable for the following components:   RBC 3.60 (*)    Hemoglobin 11.2 (*)    HCT 35.9 (*)    All other components within normal limits  TROPONIN I (HIGH SENSITIVITY)  TROPONIN I (HIGH SENSITIVITY)    EKG ED ECG REPORT   Date: 11/02/2023 0229am  Rate: 72  Rhythm: normal sinus rhythm  QRS Axis: normal  Intervals: normal  ST/T Wave abnormalities: nonspecific ST changes  Conduction Disutrbances:none  Narrative Interpretation:   Old EKG Reviewed: unchanged  I have personally reviewed the EKG tracing and agree with the computerized printout as noted.   Radiology DG Chest Portable 1 View  Result Date: 11/02/2023 CLINICAL DATA:  Chest pain. EXAM: PORTABLE CHEST 1 VIEW COMPARISON:  04/28/2023. FINDINGS: The heart size and mediastinal contours are within normal limits. There is atherosclerotic calcification of the aorta. Chronic elevation of the right diaphragm is noted. Minimal  subsegmental atelectasis or scarring is noted in the mid left lung. No consolidation, effusion, or pneumothorax. No acute osseous abnormality is seen. IMPRESSION: No active disease. Electronically Signed   By: Thornell Sartorius M.D.   On: 11/02/2023 02:38    Procedures .Critical Care  Performed by: Zadie Rhine, MD Authorized by: Zadie Rhine, MD   Critical care provider statement:    Critical care time (minutes):  45   Critical care start time:  11/02/2023 3:25 AM   Critical care end time:  11/02/2023 4:10 AM   Critical care time was exclusive of:  Separately billable procedures and treating other patients   Critical care was necessary to treat or prevent imminent or life-threatening deterioration of the following conditions:  Cardiac failure   Critical  care was time spent personally by me on the following activities:  Re-evaluation of patient's condition, pulse oximetry, ordering and review of radiographic studies, ordering and review of laboratory studies, ordering and performing treatments and interventions, review of old charts, examination of patient and development of treatment plan with patient or surrogate   I assumed direction of critical care for this patient from another provider in my specialty: no     Care discussed with: admitting provider       Medications Ordered in ED Medications  nitroGLYCERIN 50 mg in dextrose 5 % 250 mL (0.2 mg/mL) infusion (has no administration in time range)  alum & mag hydroxide-simeth (MAALOX/MYLANTA) 200-200-20 MG/5ML suspension 30 mL (has no administration in time range)  lidocaine (XYLOCAINE) 2 % viscous mouth solution 15 mL (has no administration in time range)  morphine (PF) 2 MG/ML injection 2 mg (has no administration in time range)  fentaNYL (SUBLIMAZE) injection 50 mcg (50 mcg Intravenous Given 11/02/23 0321)    ED Course/ Medical Decision Making/ A&P Clinical Course as of 11/02/23 0422  Sun Nov 02, 2023  0238 Patient with known  history of CAD and multiple other medical conditions presents with chest pain that she feels similar to prior episodes of angina. Minimal relief with nitroglycerin and analgesics.  However she is in no acute distress No acute EKG changes.  I have also reviewed prehospital EKG does not reveal any acute findings [DW]  0306 Creatinine(!): 1.52 Renal insufficiency [DW]  0306 Glucose(!): 171 Hyperglycemia [DW]  0318 Patient reports pain is still ongoing at 8 out of 10.  It still feels like pressure She denies any sharp or ripping sensation. She reports it feels similar to prior episodes of angina. Will focus on managing her pain and admit to the hospital.  Initial troponin is negative [DW]  0410 Patient reports she is feeling improved but still having chest pain.  She is in no acute distress.  She will be admitted for unstable angina Nitroglycerin has been ordered [DW]  0421 Pt admitted to dr gardner for admission  [DW]    Clinical Course User Index [DW] Zadie Rhine, MD             HEART Score: 5                    Medical Decision Making Amount and/or Complexity of Data Reviewed Labs: ordered. Decision-making details documented in ED Course. Radiology: ordered.  Risk Prescription drug management. Decision regarding hospitalization.   This patient presents to the ED for concern of chest pain, this involves an extensive number of treatment options, and is a complaint that carries with it a high risk of complications and morbidity.  The differential diagnosis includes but is not limited to acute coronary syndrome, aortic dissection, pulmonary embolism, pericarditis, pneumothorax, pneumonia, myocarditis, pleurisy, esophageal rupture    Comorbidities that complicate the patient evaluation: Patient's presentation is complicated by their history of CAD, previous CVA  Social Determinants of Health: Patient is a non-smoker  Additional history obtained: Additional history obtained  from EMS  Records reviewed  cardiology records reviewed  Lab Tests: I Ordered, and personally interpreted labs.  The pertinent results include: Renal insufficiency, hyperglycemia  Imaging Studies ordered: I ordered imaging studies including X-ray chest   I independently visualized and interpreted imaging which showed no acute findings I agree with the radiologist interpretation  Cardiac Monitoring: The patient was maintained on a cardiac monitor.  I personally viewed and interpreted the  cardiac monitor which showed an underlying rhythm of:  sinus rhythm  Medicines ordered and prescription drug management: I ordered medication including fentanyl for pain Reevaluation of the patient after these medicines showed that the patient    improved  Test Considered: I considered CT angiogram, the patient notes that this pain is similar to prior episodes of angina.  Low suspicion for PE/dissection at this time  Critical Interventions:   nitroglycerin, admission  Consultations Obtained: I requested consultation with the admitting physician [facility] , and discussed  findings as well as pertinent plan - they recommend: will admit  Reevaluation: After the interventions noted above, I reevaluated the patient and found that they have :improved  Complexity of problems addressed: Patient's presentation is most consistent with  acute presentation with potential threat to life or bodily function  Disposition: After consideration of the diagnostic results and the patient's response to treatment,  I feel that the patent would benefit from admission   .           Final Clinical Impression(s) / ED Diagnoses Final diagnoses:  Unstable angina [facility])    Rx / DC Orders ED Discharge Orders     None         Zadie Rhine, MD 11/02/23 [phone]

## 2023-11-02 NOTE — Assessment & Plan Note (Addendum)
CP obs pathway First trop neg, 2nd pending Tele monitor Hold eliquis and use heparin gtt instead, most recent eliquis dose was last night EDP started NTG drip NPO HEART score of 5 Stent to LAD in 2022 Still had an 80% RCA stenosis at the time that they havent stented though So todays CP could be either ISR of the LAD stent or from that RCA dz. Trying GI cocktail since that might have provided some relief with similar symptoms back in July 2023? As an update: CP finally started to get better, after PO GI cocktail she says. So may just be GI CP, or it may be that the NTG drip finally kicked in, not entirely clear which. Message sent to P. Trent for AM cards eval Cont crestor

## 2023-11-02 NOTE — Plan of Care (Signed)

## 2023-11-02 NOTE — H&P (Signed)
History and Physical    Patient: Kristin [name] WUJ:[id number] DOB: November 10, 1941 DOA: 11/02/2023 DOS: the patient was seen and examined on 11/02/2023 PCP: Aliene Beams, MD  Patient coming from: Home  Chief Complaint:  Chief Complaint  Patient presents with   Chest Pain    Patient to ED via EMS with complaint of chest pain x5 hours. Patient reports having an MI in 2021. EMS reports patient has had 4 SL nitroglycerin tablets . Patient refused ASA stating it will interfere with other meds she is taking according to EMS. Patient reports nausea.   HPI: Kristin [name] is a 82 y.o. female with medical history significant of a.fib on eliquis, CAD s/p stent, CKD 3, HTN, HLD, stroke, ICM with EF 40-45%.  Pt had NSTEMI in 2021, didn't get stents until 9 months later in 2022 due to being post op from a spine surg and not candidate for [facility] at the time in 2021.  CP in 06/2022 with neg trops, felt to be maybe GI related it seems?  Hadnt had CP since that time in 2023 until woke from sleep this AM with onset of CP.  Pressure like sensation, radiates to back.  8/10, feels like prior MI.  No associated SOB, nausea vomiting.  No tearing sensation.  4 SL NTG provided no relief.  Morphine provided minimal relief.  Review of Systems: As mentioned in the history of present illness. All other systems reviewed and are negative. Past Medical History:  Diagnosis Date   Atrial fibrillation (HCC)    CHF (congestive heart failure) (HCC)    Coronary artery disease    Diabetes mellitus without complication (HCC)    Hypercholesteremia    Hypertension    Renal disorder    CKD Stage IV   Stroke [facility])    Past Surgical History:  Procedure Laterality Date   CORONARY STENT INTERVENTION N/A 05/15/2021   Procedure: CORONARY STENT INTERVENTION;  Surgeon: Elder Negus, MD;  [facility];  Service: Cardiovascular;  Laterality: N/A;   LEFT HEART CATH AND CORONARY ANGIOGRAPHY N/A 05/11/2021    Procedure: LEFT HEART CATH AND CORONARY ANGIOGRAPHY;  Surgeon: Elder Negus, MD;  [facility];  Service: Cardiovascular;  Laterality: N/A;   LUMBAR LAMINECTOMY/DECOMPRESSION MICRODISCECTOMY N/A 08/19/2020   Procedure: LUMBAR LAMINECTOMY/DECOMPRESSION  Lumbar two-three, Lumbar three-four;  Surgeon: Dawley, Alan Mulder, DO;  Location: [facility] OR;  Service: Neurosurgery;  Laterality: N/A;   RIGHT/LEFT HEART CATH AND CORONARY ANGIOGRAPHY N/A 08/23/2020   Procedure: RIGHT/LEFT HEART CATH AND CORONARY ANGIOGRAPHY;  Surgeon: Yates Decamp, MD;  [facility];  Service: Cardiovascular;  Laterality: N/A;   Social History:  reports that she has never smoked. She has never used smokeless tobacco. She reports that she does not drink alcohol and does not use drugs.  Allergies  Allergen Reactions   Tramadol Nausea And Vomiting    Family History  Problem Relation Age of Onset   Diabetes Father    Heart attack Father    Heart disease Mother    Hypertension Sister    Hypertension Sister    Hypertension Sister    Hypertension Sister    Hypertension Sister    Hypertension Sister    Hypertension Sister    Hypertension Sister    Hypertension Sister    Hypertension Sister    Hypertension Sister    Hypertension Sister     Prior to Admission medications   Medication Sig Start Date End Date Taking? Authorizing Provider  apixaban (ELIQUIS) 2.5 MG TABS tablet Take 1 tablet (2.5 mg total) by mouth 2 (two) times daily. 10/10/23  Yes Gaston Islam., NP  carvedilol (COREG) 3.125 MG tablet Take 1 tablet (3.125 mg total) by mouth 2 (two) times daily with a meal. 10/10/23  Yes Gaston Islam., NP  cholecalciferol (VITAMIN D) 25 MCG (1000 UNIT) tablet Take 2,000 Units by mouth daily.   Yes [provider]  dapagliflozin propanediol (FARXIGA) 10 MG TABS tablet Take 1 tablet (10 mg total) by mouth daily before breakfast. 10/10/23  Yes Gaston Islam., NP  diclofenac Sodium  (VOLTAREN) 1 % GEL Apply 2 g topically 4 (four) times daily. 08/30/20  Yes Glade Lloyd, MD  ezetimibe (ZETIA) 10 MG tablet Take 1 tablet by mouth once daily 10/06/23  Yes Tolia, Sunit, DO  hydrALAZINE (APRESOLINE) 100 MG tablet Take 1 tablet (100 mg total) by mouth 3 (three) times daily. 10/10/23  Yes Gaston Islam., NP  isosorbide mononitrate (IMDUR) 120 MG 24 hr tablet Take 1 tablet (120 mg total) by mouth daily. 10/10/23  Yes Gaston Islam., NP  nitroGLYCERIN (NITROSTAT) 0.4 MG SL tablet Place 1 tablet (0.4 mg total) under the tongue every 5 (five) minutes as needed for up to 30 doses for chest pain. 08/02/22  Yes Tolia, Sunit, DO  pantoprazole (PROTONIX) 40 MG tablet Take 1 tablet (40 mg total) by mouth daily. 08/30/20  Yes Glade Lloyd, MD  rosuvastatin (CRESTOR) 40 MG tablet Take 1 tablet (40 mg total) by mouth at bedtime. 10/10/23  Yes Gaston Islam., NP  sacubitril-valsartan (ENTRESTO) 49-51 MG Take 1 tablet by mouth 2 (two) times daily. 10/10/23  Yes Gaston Islam., NP  colchicine 0.6 MG tablet Take 0.5 tablets (0.3 mg total) by mouth daily. 08/31/20   Glade Lloyd, MD  cyclobenzaprine (FLEXERIL) 10 MG tablet Take 0.5 tablets (5 mg total) by mouth 2 (two) times daily as needed for muscle spasms. 09/02/23   Elpidio Anis, PA-C  HYDROcodone-acetaminophen (NORCO) 5-325 MG tablet Take 1 tablet by mouth every 4 (four) hours as needed for moderate pain. 09/02/23   Elpidio Anis, PA-C    Physical Exam: Vitals:   11/02/23 0245 11/02/23 0300 11/02/23 0315 11/02/23 0345  BP: (!) 144/72 137/83 (!) 150/83 131/69  Pulse: 72 72 72 62  Resp: (!) 7 11 20 11   Temp: 98.7 F (37.1 C) 98.7 F (37.1 C)    TempSrc:  Oral    SpO2: 100% 100% 100% 97%  Weight:      Height:       Constitutional: NAD, calm, comfortable Respiratory: clear to auscultation bilaterally, no wheezing, no crackles. Normal respiratory effort. No accessory muscle use.  Cardiovascular: Regular rate and rhythm, no  murmurs / rubs / gallops. No extremity edema. 2+ pedal pulses. No carotid bruits.  Abdomen: no tenderness, no masses palpated. No hepatosplenomegaly. Bowel sounds positive.  Neurologic: CN 2-12 grossly intact. Sensation intact, DTR normal. Strength 5/5 in all 4.  Psychiatric: Normal judgment and insight. Alert and oriented x 3. Normal mood.   Data Reviewed:    Labs on Admission: I have personally reviewed following labs and imaging studies  CBC: Recent Labs  Lab 11/02/23 0228  WBC 7.7  HGB 11.2*  HCT 35.9*  MCV 99.7  PLT 220   Basic Metabolic Panel: Recent Labs  Lab 11/02/23 0228  NA 136  K 3.9  CL 102  CO2 24  GLUCOSE 171*  BUN 20  CREATININE 1.52*  CALCIUM 9.2   GFR: Estimated Creatinine Clearance: 29.2 mL/min (A) (by C-G formula based on SCr of 1.52 mg/dL (H)). Liver Function Tests: No results for input(s): "AST", "ALT", "ALKPHOS", "BILITOT", "PROT", "ALBUMIN" in the last 168 hours. No results for input(s): "LIPASE", "AMYLASE" in the last 168 hours. No results for input(s): "AMMONIA" in the last 168 hours. Coagulation Profile: No results for input(s): "INR", "PROTIME" in the last 168 hours. Cardiac Enzymes: No results for input(s): "CKTOTAL", "CKMB", "CKMBINDEX", "TROPONINI" in the last 168 hours. BNP (last 3 results) No results for input(s): "PROBNP" in the last 8760 hours. HbA1C: No results for input(s): "HGBA1C" in the last 72 hours. CBG: No results for input(s): "GLUCAP" in the last 168 hours. Lipid Profile: No results for input(s): "CHOL", "HDL", "LDLCALC", "TRIG", "CHOLHDL", "LDLDIRECT" in the last 72 hours. Thyroid Function Tests: No results for input(s): "TSH", "T4TOTAL", "FREET4", "T3FREE", "THYROIDAB" in the last 72 hours. Anemia Panel: No results for input(s): "VITAMINB12", "FOLATE", "FERRITIN", "TIBC", "IRON", "RETICCTPCT" in the last 72 hours. Urine analysis:    Component Value Date/Time   COLORURINE YELLOW 05/12/2021 0937   APPEARANCEUR  CLEAR 05/12/2021 0937   LABSPEC 1.010 05/12/2021 0937   PHURINE 6.0 05/12/2021 0937   GLUCOSEU NEGATIVE 05/12/2021 0937   HGBUR NEGATIVE 05/12/2021 0937   BILIRUBINUR NEGATIVE 05/12/2021 0937   KETONESUR NEGATIVE 05/12/2021 0937   PROTEINUR NEGATIVE 05/12/2021 0937   NITRITE NEGATIVE 05/12/2021 0937   LEUKOCYTESUR NEGATIVE 05/12/2021 0937    Radiological Exams on Admission: DG Chest Portable 1 View  Result Date: 11/02/2023 CLINICAL DATA:  Chest pain. EXAM: PORTABLE CHEST 1 VIEW COMPARISON:  04/28/2023. FINDINGS: The heart size and mediastinal contours are within normal limits. There is atherosclerotic calcification of the aorta. Chronic elevation of the right diaphragm is noted. Minimal subsegmental atelectasis or scarring is noted in the mid left lung. No consolidation, effusion, or pneumothorax. No acute osseous abnormality is seen. IMPRESSION: No active disease. Electronically Signed   By: Thornell Sartorius M.D.   On: 11/02/2023 02:38    EKG: Independently reviewed.   Assessment and Plan: * Chest pain, rule out acute myocardial infarction CP obs pathway First trop neg, 2nd pending Tele monitor Hold eliquis and use heparin gtt instead, most recent eliquis dose was last night EDP started NTG drip NPO HEART score of 5 Stent to LAD in 2022 Still had an 80% RCA stenosis at the time that they havent stented though So todays CP could be either ISR of the LAD stent or from that RCA dz. Trying GI cocktail since that might have provided some relief with similar symptoms back in July 2023? As an update: CP finally started to get better, after PO GI cocktail she says. So may just be GI CP, or it may be that the NTG drip finally kicked in, not entirely clear which. Message sent to P. Trent for AM cards eval Cont crestor  Cardiomyopathy (HCC) Cont entresto, coreg, hydralazine Hold imdur (using NTG gtt instead)  CKD stage 3b, GFR 30-44 ml/min (HCC) Creat 1.5 today, at patients  baseline  Atrial fibrillation (HCC) Cont Coreg Hold eliquis, use heparin gtt instead.      Advance Care Planning:   Code Status: Full Code  Consults: Message sent to P. Trent for AM cards eval  Family Communication: No family in room  Severity of Illness: The appropriate patient status for this patient is OBSERVATION. Observation status is judged to be reasonable and necessary in order to  provide the required intensity of service to ensure the patient's safety. The patient's presenting symptoms, physical exam findings, and initial radiographic and laboratory data in the context of their medical condition is felt to place them at decreased risk for further clinical deterioration. Furthermore, it is anticipated that the patient will be medically stable for discharge from the hospital within 2 midnights of admission.   Author: Hillary Bow., DO 11/02/2023 4:58 AM  For on call review [email or web address].

## 2023-11-02 NOTE — ED Notes (Signed)
ED TO INPATIENT HANDOFF REPORT  ED Nurse Name and Phone #:  Theadora Rama RN  S Name/Age/Gender Kristin [name] 82 y.o. female Room/Bed: 026C/026C  Code Status   Code Status: Full Code  Home/SNF/Other Home Patient oriented to: self, place, time, and situation Is this baseline? Yes   Triage Complete: Triage complete  Chief Complaint Chest pain, rule out acute myocardial infarction [R07.9]  Triage Note No notes on file   Allergies Allergies  Allergen Reactions   Tramadol Nausea And Vomiting    Level of Care/Admitting Diagnosis ED Disposition     ED Disposition  Admit   Condition  --   Comment  Hospital [facility]: [facility] [[id number]]  Level of Care: Telemetry Cardiac [103]  May place patient in observation at [facility] or [facility] if equivalent level of care is available:: Yes  Covid Evaluation: Asymptomatic - no recent exposure (last 10 days) testing not required  Diagnosis: Chest pain, rule out acute myocardial infarction [604540]  Admitting Physician: Hillary Bow [[id number]]  Attending Physician: Hillary Bow [[id number]]          B Medical/Surgery History Past Medical History:  Diagnosis Date   Atrial fibrillation (HCC)    CHF (congestive heart failure) (HCC)    Coronary artery disease    Diabetes mellitus without complication (HCC)    Hypercholesteremia    Hypertension    Renal disorder    CKD Stage IV   Stroke [facility])    Past Surgical History:  Procedure Laterality Date   CORONARY STENT INTERVENTION N/A 05/15/2021   Procedure: CORONARY STENT INTERVENTION;  Surgeon: Elder Negus, MD;  [facility];  Service: Cardiovascular;  Laterality: N/A;   LEFT HEART CATH AND CORONARY ANGIOGRAPHY N/A 05/11/2021   Procedure: LEFT HEART CATH AND CORONARY ANGIOGRAPHY;  Surgeon: Elder Negus, MD;  [facility];  Service: Cardiovascular;  Laterality: N/A;   LUMBAR LAMINECTOMY/DECOMPRESSION MICRODISCECTOMY  N/A 08/19/2020   Procedure: LUMBAR LAMINECTOMY/DECOMPRESSION  Lumbar two-three, Lumbar three-four;  Surgeon: Dawley, Alan Mulder, DO;  Location: [facility] OR;  Service: Neurosurgery;  Laterality: N/A;   RIGHT/LEFT HEART CATH AND CORONARY ANGIOGRAPHY N/A 08/23/2020   Procedure: RIGHT/LEFT HEART CATH AND CORONARY ANGIOGRAPHY;  Surgeon: Yates Decamp, MD;  [facility];  Service: Cardiovascular;  Laterality: N/A;     A IV Location/Drains/Wounds Patient Lines/Drains/Airways Status     Active Line/Drains/Airways     Name Placement date Placement time Site Days   Peripheral IV 11/02/23 20 G Anterior;Left;Proximal Forearm 11/02/23  0251  Forearm  less than 1            Intake/Output Last 24 hours No intake or output data in the 24 hours ending 11/02/23 0509  Labs/Imaging Results for orders placed or performed during the hospital encounter of 11/02/23 (from the past 48 hour(s))  Basic metabolic panel     Status: Abnormal   Collection Time: 11/02/23  2:28 AM  Result Value Ref Range   Sodium 136 135 - 145 mmol/L   Potassium 3.9 3.5 - 5.1 mmol/L   Chloride 102 98 - 111 mmol/L   CO2 24 22 - 32 mmol/L   Glucose, Bld 171 (H) 70 - 99 mg/dL    Comment: Glucose reference range applies only to samples taken after fasting for at least 8 hours.   BUN 20 8 - 23 mg/dL   Creatinine, Ser 9.14 (H) 0.44 - 1.00 mg/dL   Calcium 9.2 8.9 -  10.3 mg/dL   GFR, Estimated 34 (L) >60 mL/min    Comment: (NOTE) Calculated using the CKD-EPI Creatinine Equation (2021)    Anion gap 10 5 - 15    Comment: Performed at [facility], [address], [place], [place] [address]  Troponin I (High Sensitivity)     Status: None   Collection Time: 11/02/23  2:28 AM  Result Value Ref Range   Troponin I (High Sensitivity) 4 <18 ng/L    Comment: (NOTE) Elevated high sensitivity troponin I (hsTnI) values and significant  changes across serial measurements may suggest ACS but many other  chronic and acute conditions  are known to elevate hsTnI results.  Refer to the "Links" section for chest pain algorithms and additional  guidance. Performed at [facility], [address], [place], [place] [address]   CBC     Status: Abnormal   Collection Time: 11/02/23  2:28 AM  Result Value Ref Range   WBC 7.7 4.0 - 10.5 K/uL   RBC 3.60 (L) 3.87 - 5.11 MIL/uL   Hemoglobin 11.2 (L) 12.0 - 15.0 g/dL   HCT 32.4 (L) 40.1 - 02.7 %   MCV 99.7 80.0 - 100.0 fL   MCH 31.1 26.0 - 34.0 pg   MCHC 31.2 30.0 - 36.0 g/dL   RDW 25.3 66.4 - 40.3 %   Platelets 220 150 - 400 K/uL   nRBC 0.0 0.0 - 0.2 %    Comment: Performed at [facility], [address], [place], [place] [address]   DG Chest Portable 1 View  Result Date: 11/02/2023 CLINICAL DATA:  Chest pain. EXAM: PORTABLE CHEST 1 VIEW COMPARISON:  04/28/2023. FINDINGS: The heart size and mediastinal contours are within normal limits. There is atherosclerotic calcification of the aorta. Chronic elevation of the right diaphragm is noted. Minimal subsegmental atelectasis or scarring is noted in the mid left lung. No consolidation, effusion, or pneumothorax. No acute osseous abnormality is seen. IMPRESSION: No active disease. Electronically Signed   By: Thornell Sartorius M.D.   On: 11/02/2023 02:38    Pending Labs Unresulted Labs (From admission, onward)     Start     Ordered   11/03/23 0500  Heparin level (unfractionated)  Daily,   R      11/02/23 0443   11/03/23 0500  APTT  Daily,   R      11/02/23 0443   11/02/23 1200  APTT  Once,   R        11/02/23 0443   11/02/23 0500  CBC  Daily,   R      11/02/23 0443   11/02/23 0440  Protime-INR  Once,   STAT        11/02/23 0440            Vitals/Pain Today's Vitals   11/02/23 0350 11/02/23 0446 11/02/23 0455 11/02/23 0502  BP:      Pulse:      Resp:      Temp:      TempSrc:      SpO2:      Weight:      Height:      PainSc: 7  6  5  4      Isolation Precautions No active  isolations  Medications Medications  nitroGLYCERIN 50 mg in dextrose 5 % 250 mL (0.2 mg/mL) infusion (15 mcg/min Intravenous Rate/Dose Change 11/02/23 0503)  morphine (PF) 2 MG/ML injection 2 mg (has no administration in  time range)  carvedilol (COREG) tablet 3.125 mg (has no administration in time range)  dapagliflozin propanediol (FARXIGA) tablet 10 mg (has no administration in time range)  hydrALAZINE (APRESOLINE) tablet 100 mg (has no administration in time range)  rosuvastatin (CRESTOR) tablet 40 mg (has no administration in time range)  sacubitril-valsartan (ENTRESTO) 49-51 mg per tablet (has no administration in time range)  pantoprazole (PROTONIX) EC tablet 40 mg (has no administration in time range)  ezetimibe (ZETIA) tablet 10 mg (has no administration in time range)  acetaminophen (TYLENOL) tablet 650 mg (has no administration in time range)  ondansetron (ZOFRAN) injection 4 mg (has no administration in time range)  heparin ADULT infusion 100 units/mL (25000 units/234mL) (1,000 Units/hr Intravenous New Bag/Given 11/02/23 0454)  fentaNYL (SUBLIMAZE) injection 50 mcg (50 mcg Intravenous Given 11/02/23 0321)  alum & mag hydroxide-simeth (MAALOX/MYLANTA) 200-200-20 MG/5ML suspension 30 mL (30 mLs Oral Given 11/02/23 0444)  lidocaine (XYLOCAINE) 2 % viscous mouth solution 15 mL (15 mLs Mouth/Throat Given 11/02/23 0444)    Mobility walks with device     Focused Assessments Cardiac Assessment Handoff:  Cardiac Rhythm: Normal sinus rhythm Lab Results  Component Value Date   CKTOTAL 54 08/18/2020   Lab Results  Component Value Date   DDIMER 0.58 (H) 05/11/2021   Does the Patient currently have chest pain? Yes   , Neuro Assessment Handoff:  Swallow screen pass? Yes  Cardiac Rhythm: Normal sinus rhythm       Neuro Assessment:   Neuro Checks:      Has TPA been given? No If patient is a Neuro Trauma and patient is going to OR before floor call report to 4N Charge  nurse: [phone] or [phone]  , Pulmonary Assessment Handoff:  Lung sounds:   O2 Device: Room Air      R Recommendations: See Admitting Provider Note  Report given to:   Additional Notes:

## 2023-11-02 NOTE — Assessment & Plan Note (Signed)
Cont Coreg Hold eliquis, use heparin gtt instead.

## 2023-11-02 NOTE — Progress Notes (Signed)
PHARMACY - ANTICOAGULATION CONSULT NOTE  Pharmacy Consult for IV heparin  Indication: chest pain/ACS  Allergies  Allergen Reactions   Tramadol Nausea And Vomiting    Patient Measurements: Height: 5\' 4"  (162.6 cm) Weight: 83 kg (182 lb 15.7 oz) IBW/kg (Calculated) : 54.7 Heparin Dosing Weight: 72.8 kg  Vital Signs: Temp: 98.7 F (37.1 C) (11/24 0300) Temp Source: Oral (11/24 0300) BP: 131/69 (11/24 0345) Pulse Rate: 62 (11/24 0345)  Labs: Recent Labs    11/02/23 0228  HGB 11.2*  HCT 35.9*  PLT 220  CREATININE 1.52*  TROPONINIHS 4    Estimated Creatinine Clearance: 29.2 mL/min (A) (by C-G formula based on SCr of 1.52 mg/dL (H)).   Medical History: Past Medical History:  Diagnosis Date   Atrial fibrillation (HCC)    CHF (congestive heart failure) (HCC)    Coronary artery disease    Diabetes mellitus without complication (HCC)    Hypercholesteremia    Hypertension    Renal disorder    CKD Stage IV   Stroke [facility])     Assessment: Kristin [name] is a 82 y.o. year old female admitted on 11/02/2023 with concern for ACS. Eliquis prior to admission for afib (LD 11/23 1800). Pharmacy consulted to dose heparin.   Goal of Therapy:  Heparin level 0.3-0.7 units/ml Monitor platelets by anticoagulation protocol: Yes   Plan:  Heparin infusion at 1000 units/hr (no bolus with eliquis pta) 8 aPTT  Daily aPTT, heparin level, CBC, and monitoring for bleeding F/u plans for cath and anticoagulation   Thank you for allowing pharmacy to participate in this patient's care.  Marja Kays, PharmD Emergency Medicine Clinical Pharmacist 11/02/2023,4:40 AM

## 2023-11-02 NOTE — Assessment & Plan Note (Signed)
Cont entresto, coreg, hydralazine Hold imdur (using NTG gtt instead)

## 2023-11-02 NOTE — Progress Notes (Signed)
PHARMACY - ANTICOAGULATION CONSULT NOTE  Pharmacy Consult for IV heparin  Indication: chest pain/ACS  Allergies  Allergen Reactions   Tramadol Nausea And Vomiting    Patient Measurements: Height: 5\' 4"  (162.6 cm) Weight: 82.7 kg (182 lb 6.4 oz) IBW/kg (Calculated) : 54.7 Heparin Dosing Weight: 72.8 kg  Vital Signs: Temp: 97.8 F (36.6 C) (11/24 0624) Temp Source: Oral (11/24 0624) BP: 147/78 (11/24 0624) Pulse Rate: 106 (11/24 0624)  Labs: Recent Labs    11/02/23 0228 11/02/23 0430 11/02/23 0435  HGB 11.2*  --   --   HCT 35.9*  --   --   PLT 220  --   --   LABPROT  --   --  14.9  INR  --   --  1.2  CREATININE 1.52*  --   --   TROPONINIHS 4 6  --     Estimated Creatinine Clearance: 29.2 mL/min (A) (by C-G formula based on SCr of 1.52 mg/dL (H)).   Medical History: Past Medical History:  Diagnosis Date   Atrial fibrillation (HCC)    CHF (congestive heart failure) (HCC)    Coronary artery disease    Diabetes mellitus without complication (HCC)    Hypercholesteremia    Hypertension    Renal disorder    CKD Stage IV   Stroke [facility])     Assessment: Kristin [name] is a 82 y.o. year old female admitted on 11/02/2023 with concern for ACS. Eliquis prior to admission for afib (LD 11/23 1800). Pharmacy consulted to dose heparin.  aPTT (65) almost at goal on heparin 1000 units/hr. CBC ok - hgb (11.4) and PLTc (236). No signs of bleeding or issues with heparin infusion noted.  Goal of Therapy:  aPTT 66-102 seconds Heparin level 0.3-0.7 units/ml Monitor platelets by anticoagulation protocol: Yes   Plan:  Increase heparin infusion to 1150 units/hr  Obtain 8hr aPTT level Daily aPTT, heparin level, CBC, and monitoring for bleeding F/u plans for cath and anticoagulation

## 2023-11-03 ENCOUNTER — Other Ambulatory Visit (HOSPITAL_COMMUNITY): Payer: Self-pay

## 2023-11-03 DIAGNOSIS — I10 Essential (primary) hypertension: Secondary | ICD-10-CM | POA: Diagnosis not present

## 2023-11-03 DIAGNOSIS — R079 Chest pain, unspecified: Secondary | ICD-10-CM | POA: Diagnosis not present

## 2023-11-03 DIAGNOSIS — E785 Hyperlipidemia, unspecified: Secondary | ICD-10-CM | POA: Diagnosis not present

## 2023-11-03 LAB — CBC WITH DIFFERENTIAL/PLATELET
Abs Immature Granulocytes: 0.01 10*3/uL (ref 0.00–0.07)
Basophils Absolute: 0 10*3/uL (ref 0.0–0.1)
Basophils Relative: 0 %
Eosinophils Absolute: 0.1 10*3/uL (ref 0.0–0.5)
Eosinophils Relative: 3 %
HCT: 34.5 % — ABNORMAL LOW (ref 36.0–46.0)
Hemoglobin: 10.5 g/dL — ABNORMAL LOW (ref 12.0–15.0)
Immature Granulocytes: 0 %
Lymphocytes Relative: 47 %
Lymphs Abs: 2.1 10*3/uL (ref 0.7–4.0)
MCH: 30.4 pg (ref 26.0–34.0)
MCHC: 30.4 g/dL (ref 30.0–36.0)
MCV: 100 fL (ref 80.0–100.0)
Monocytes Absolute: 0.7 10*3/uL (ref 0.1–1.0)
Monocytes Relative: 15 %
Neutro Abs: 1.6 10*3/uL — ABNORMAL LOW (ref 1.7–7.7)
Neutrophils Relative %: 35 %
Platelets: 210 10*3/uL (ref 150–400)
RBC: 3.45 MIL/uL — ABNORMAL LOW (ref 3.87–5.11)
RDW: 13.7 % (ref 11.5–15.5)
WBC: 4.5 10*3/uL (ref 4.0–10.5)
nRBC: 0 % (ref 0.0–0.2)

## 2023-11-03 LAB — COMPREHENSIVE METABOLIC PANEL
ALT: 35 U/L (ref 0–44)
AST: 40 U/L (ref 15–41)
Albumin: 3 g/dL — ABNORMAL LOW (ref 3.5–5.0)
Alkaline Phosphatase: 92 U/L (ref 38–126)
Anion gap: 4 — ABNORMAL LOW (ref 5–15)
BUN: 24 mg/dL — ABNORMAL HIGH (ref 8–23)
CO2: 30 mmol/L (ref 22–32)
Calcium: 9 mg/dL (ref 8.9–10.3)
Chloride: 103 mmol/L (ref 98–111)
Creatinine, Ser: 1.46 mg/dL — ABNORMAL HIGH (ref 0.44–1.00)
GFR, Estimated: 35 mL/min — ABNORMAL LOW (ref >60)
Glucose, Bld: 98 mg/dL (ref 70–99)
Potassium: 4.6 mmol/L (ref 3.5–5.1)
Sodium: 137 mmol/L (ref 135–145)
Total Bilirubin: 0.4 mg/dL (ref <1.2)
Total Protein: 6.1 g/dL — ABNORMAL LOW (ref 6.5–8.1)

## 2023-11-03 LAB — PHOSPHORUS: Phosphorus: 2.9 mg/dL (ref 2.5–4.6)

## 2023-11-03 LAB — MAGNESIUM: Magnesium: 2.2 mg/dL (ref 1.7–2.4)

## 2023-11-03 MED ORDER — CARVEDILOL 12.5 MG PO TABS: 12.5000 mg | ORAL_TABLET | Freq: Two times a day (BID) | ORAL | Status: DC

## 2023-11-03 MED ORDER — CARVEDILOL 6.25 MG PO TABS
6.2500 mg | ORAL_TABLET | Freq: Once | ORAL | Status: AC
  Administered 2023-11-03: 6.25 mg via ORAL
  Filled 2023-11-03: qty 1

## 2023-11-03 MED ORDER — CARVEDILOL 12.5 MG PO TABS
12.5000 mg | ORAL_TABLET | Freq: Two times a day (BID) | ORAL | 1 refills | Status: DC
  Filled 2023-11-03: qty 60, 30d supply, fill #0

## 2023-11-03 NOTE — Plan of Care (Signed)
Problem: Activity: Goal: Ability to tolerate increased activity will improve Outcome: Progressing   Problem: Education: Goal: Knowledge of General Education information will improve Description: Including pain rating scale, medication(s)/side effects and non-pharmacologic comfort measures Outcome: Progressing

## 2023-11-03 NOTE — Plan of Care (Signed)
Problem: Education: Goal: Understanding of cardiac disease, CV risk reduction, and recovery process will improve Outcome: Adequate for Discharge Goal: Individualized Educational Video(s) Outcome: Adequate for Discharge   Problem: Activity: Goal: Ability to tolerate increased activity will improve Outcome: Adequate for Discharge   Problem: Cardiac: Goal: Ability to achieve and maintain adequate cardiovascular perfusion will improve Outcome: Adequate for Discharge   Problem: Health Behavior/Discharge Planning: Goal: Ability to safely manage health-related needs after discharge will improve Outcome: Adequate for Discharge   Problem: Education: Goal: Knowledge of General Education information will improve Description: Including pain rating scale, medication(s)/side effects and non-pharmacologic comfort measures Outcome: Adequate for Discharge   Problem: Health Behavior/Discharge Planning: Goal: Ability to manage health-related needs will improve Outcome: Adequate for Discharge   Problem: Clinical Measurements: Goal: Ability to maintain clinical measurements within normal limits will improve Outcome: Adequate for Discharge Goal: Will remain free from infection Outcome: Adequate for Discharge Goal: Diagnostic test results will improve Outcome: Adequate for Discharge Goal: Respiratory complications will improve Outcome: Adequate for Discharge Goal: Cardiovascular complication will be avoided Outcome: Adequate for Discharge   Problem: Activity: Goal: Risk for activity intolerance will decrease Outcome: Adequate for Discharge   Problem: Nutrition: Goal: Adequate nutrition will be maintained Outcome: Adequate for Discharge   Problem: Coping: Goal: Level of anxiety will decrease Outcome: Adequate for Discharge   Problem: Elimination: Goal: Will not experience complications related to bowel motility Outcome: Adequate for Discharge Goal: Will not experience complications  related to urinary retention Outcome: Adequate for Discharge   Problem: Pain Management: Goal: General experience of comfort will improve Outcome: Adequate for Discharge   Problem: Safety: Goal: Ability to remain free from injury will improve Outcome: Adequate for Discharge   Problem: Skin Integrity: Goal: Risk for impaired skin integrity will decrease Outcome: Adequate for Discharge

## 2023-11-03 NOTE — TOC Initial Note (Signed)
Transition of Care [facility]) - Initial/Assessment Note    Patient Details  Name: Kristin [name] MRN: [id number] Date of Birth: 1941/07/25  Transition of Care [facility]) CM/SW Contact:    Leone Haven, RN Phone Number: 11/03/2023, 11:18 AM  Clinical Narrative:                 From home with daughter, has PCP and insurance on file, states has no HH services in place at this time, has a cane at home.  Daughter is at bedside tol transport her home at [employer] and she is support system, states gets medications from [place] on [address].  Pta self ambulatory .  Expected Discharge Plan: Home/Self Care Barriers to Discharge: No Barriers Identified   Patient Goals and CMS Choice Patient states their goals for this hospitalization and ongoing recovery are:: return home   Choice offered to / list presented to : NA      Expected Discharge Plan and Services In-house Referral: NA Discharge Planning Services: CM Consult Post Acute Care Choice: NA Living arrangements for the past 2 months: Single Family Home                 DME Arranged: N/A DME Agency: NA       HH Arranged: NA          Prior Living Arrangements/Services Living arrangements for the past 2 months: Single Family Home Lives with:: Adult Children (daughter) Patient language and need for interpreter reviewed:: Yes Do you feel safe going back to the place where you live?: Yes      Need for Family Participation in Patient Care: Yes (Comment) Care giver support system in place?: Yes (comment) Current home services: DME (cane) Criminal Activity/Legal Involvement Pertinent to Current Situation/Hospitalization: No - Comment as needed  Activities of Daily Living   ADL Screening (condition at time of admission) Independently performs ADLs?: Yes (appropriate for developmental age) Is the patient deaf or have difficulty hearing?: Yes Does the patient have difficulty seeing, even when wearing glasses/contacts?: Yes Does the  patient have difficulty concentrating, remembering, or making decisions?: Yes  Permission Sought/Granted Permission sought to share information with : Case Manager Permission granted to share information with : Yes, Verbal Permission Granted              Emotional Assessment Appearance:: Appears stated age Attitude/Demeanor/Rapport: Engaged Affect (typically observed): Appropriate Orientation: : Oriented to Self, Oriented to Place, Oriented to  Time, Oriented to Situation Alcohol / Substance Use: Not Applicable Psych Involvement: No (comment)  Admission diagnosis:  Unstable angina (HCC) [I20.0] Chest pain, rule out acute myocardial infarction [R07.9] Patient Active Problem List   Diagnosis Date Noted   Chest pain, rule out acute myocardial infarction 11/02/2023   Status post coronary artery stent placement    CKD stage 3b, GFR 30-44 ml/min (HCC) 05/11/2021   HFrEF (heart failure with reduced ejection fraction) (HCC)    Unilateral primary osteoarthritis, left knee 04/16/2021   Unilateral primary osteoarthritis, right knee 10/03/2020   Cerebral embolism with cerebral infarction 08/28/2020   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 (HCC)    AKI (acute kidney injury) (HCC)    Atherosclerosis of native coronary artery of native heart without angina pectoris    Atrial fibrillation (HCC)    Mixed hyperlipidemia    Pulmonary edema    Acute HFrEF (heart failure with reduced ejection fraction) (HCC)    Cardiomyopathy (HCC)    Effusion of right knee  joint    SIRS (systemic inflammatory response syndrome) (HCC) 08/18/2020   Lower extremity weakness 08/18/2020   Hypertension    Chronic low back pain with bilateral sciatica    PCP:  Aliene Beams, MD Pharmacy:   [facility] [address]), [place] - [address] [place] ([place]) [place] [address] Phone: [phone] Fax: [phone]  [facility] [address] [place] [address] Phone: [phone] Fax: [phone]     Social Determinants of Health (SDOH) Social History: SDOH Screenings   Food Insecurity: No Food Insecurity (11/02/2023)  Housing: Low Risk  (11/02/2023)  Transportation Needs: Unmet Transportation Needs (11/02/2023)  Utilities: Not At Risk (11/02/2023)  Tobacco Use: Low Risk  (11/02/2023)   SDOH Interventions:     Readmission Risk Interventions     No data to display

## 2023-11-03 NOTE — TOC Transition Note (Signed)
Transition of Care [facility]) - CM/SW Discharge Note   Patient Details  Name: Kristin [name] MRN: [id number] Date of Birth: 1941-06-24  [facility]) CM/SW Contact:  Leone Haven, RN Phone Number: 11/03/2023, 11:20 AM   Clinical Narrative:    For dc today, TOC to fill medications, daughter is at bedside to transport home, she has no needs.   Final next level of care: Home/Self Care Barriers to Discharge: No Barriers Identified   Patient Goals and CMS Choice   Choice offered to / list presented to : NA  Discharge Placement                         Discharge Plan and Services Additional resources added to the After Visit Summary for   In-house Referral: NA Discharge Planning Services: CM Consult Post Acute Care Choice: NA          DME Arranged: N/A DME Agency: NA       HH Arranged: NA          Social Determinants of Health (SDOH) Interventions SDOH Screenings   Food Insecurity: No Food Insecurity (11/02/2023)  Housing: Low Risk  (11/02/2023)  Transportation Needs: Unmet Transportation Needs (11/02/2023)  Utilities: Not At Risk (11/02/2023)  Tobacco Use: Low Risk  (11/02/2023)     Readmission Risk Interventions     No data to display

## 2023-11-03 NOTE — Progress Notes (Signed)
Progress Note  Patient Name: Kristin [name] Date of Encounter: 11/03/2023  Primary Cardiologist: Tessa Lerner, DO   Subjective   Patient seen and examined at her bediside/ She was awake   Inpatient Medications    Scheduled Meds:  apixaban  2.5 mg Oral BID   carvedilol  6.25 mg Oral BID WC   dapagliflozin propanediol  10 mg Oral QAC breakfast   ezetimibe  10 mg Oral Daily   feeding supplement  237 mL Oral BID BM   hydrALAZINE  100 mg Oral TID   isosorbide mononitrate  30 mg Oral Daily   pantoprazole  40 mg Oral Daily   rosuvastatin  40 mg Oral QHS   sacubitril-valsartan  1 tablet Oral BID   Continuous Infusions:  PRN Meds: acetaminophen, ondansetron (ZOFRAN) IV   Vital Signs    Vitals:   11/02/23 2135 11/03/23 0027 11/03/23 0450 11/03/23 0725  BP: (!) 110/59 130/75 135/66 (!) 151/100  Pulse:  77 87 71  Resp:  16 18 18   Temp:  98.5 F (36.9 C) 98.6 F (37 C) 97.7 F (36.5 C)  TempSrc:  Oral Oral Oral  SpO2:  97% 99% 100%  Weight:   82 kg   Height:        Intake/Output Summary (Last 24 hours) at 11/03/2023 0942 Last data filed at 11/03/2023 8756 Gross per 24 hour  Intake 1015.79 ml  Output 1000 ml  Net 15.79 ml   Filed Weights   11/02/23 0225 11/02/23 0624 11/03/23 0450  Weight: 83 kg 82.7 kg 82 kg    Telemetry    Sinus rhythm  - Personally Reviewed  ECG    None today  - Personally Reviewed  Physical Exam   General: Comfortable, sitting up in a chair Head: Atraumatic, normal size  Eyes: PEERLA, EOMI  Neck: Supple, normal JVD Cardiac: Normal S1, S2; RRR; no murmurs, rubs, or gallops Lungs: Clear to auscultation bilaterally Abd: Soft, nontender, no hepatomegaly  Ext: warm, no edema Musculoskeletal: No deformities, BUE and BLE strength normal and equal Skin: Warm and dry, no rashes   Neuro: Alert and oriented to person, place, time, and situation, CNII-XII grossly intact, no focal deficits  Psych: Normal mood and affect   Labs     Chemistry Recent Labs  Lab 11/02/23 0228 11/03/23 0258  NA 136 137  K 3.9 4.6  CL 102 103  CO2 24 30  GLUCOSE 171* 98  BUN 20 24*  CREATININE 1.52* 1.46*  CALCIUM 9.2 9.0  PROT  --  6.1*  ALBUMIN  --  3.0*  AST  --  40  ALT  --  35  ALKPHOS  --  92  BILITOT  --  0.4  GFRNONAA 34* 35*  ANIONGAP 10 4*     Hematology Recent Labs  Lab 11/02/23 0228 11/02/23 0806 11/03/23 0258  WBC 7.7 5.8 4.5  RBC 3.60* 3.70* 3.45*  HGB 11.2* 11.4* 10.5*  HCT 35.9* 36.4 34.5*  MCV 99.7 98.4 100.0  MCH 31.1 30.8 30.4  MCHC 31.2 31.3 30.4  RDW 13.7 13.6 13.7  PLT 220 236 210    Cardiac EnzymesNo results for input(s): "TROPONINI" in the last 168 hours. No results for input(s): "TROPIPOC" in the last 168 hours.   BNPNo results for input(s): "BNP", "PROBNP" in the last 168 hours.   DDimer No results for input(s): "DDIMER" in the last 168 hours.   Radiology    DG Chest Portable 1 View  Result Date: 11/02/2023  CLINICAL DATA:  Chest pain. EXAM: PORTABLE CHEST 1 VIEW COMPARISON:  04/28/2023. FINDINGS: The heart size and mediastinal contours are within normal limits. There is atherosclerotic calcification of the aorta. Chronic elevation of the right diaphragm is noted. Minimal subsegmental atelectasis or scarring is noted in the mid left lung. No consolidation, effusion, or pneumothorax. No acute osseous abnormality is seen. IMPRESSION: No active disease. Electronically Signed   By: Thornell Sartorius M.D.   On: 11/02/2023 02:38    Cardiac Studies   Echo   Patient Profile     82 y.o. female with cad and presented for chest pain suspected to be GI etiology   Assessment & Plan    Coronary artery disease with atypical chest pain-tells me that her pain relieved in the ED after she was given a GI cocktail. Chronic heart failure with midrange ejection fraction Paroxysmal atrial fibrillation Mitral regurgitation Hypertension Hyperlipidemia Chronic kidney disease stage III  She is still  chest pain free. She is hypertensive though. Resume home Imdur. Increase coreg 12.5 mg BID.  No need for further ischemic evaluation.Marland Kitchen  Avoid renal toxins.  Continue Eliquis for stroke prevention.    For questions or updates, please contact [facility] Please consult [email or web address] for contact info under Cardiology/STEMI.      SignedThomasene Ripple, DO  11/03/2023, 9:42 AM

## 2023-11-03 NOTE — Discharge Summary (Signed)
Physician Discharge Summary  Kristin [name] WUJ:[id number] DOB: 1941-01-28 DOA: 11/02/2023  PCP: Aliene Beams, MD  Admit date: 11/02/2023 Discharge date: 11/03/2023  Time spent: 40 minutes  Recommendations for Outpatient Follow-up:  Follow outpatient CBC/CMP  Follow with cardiology outpatient Consider GI follow up outpatient for CP improved with GI cocktail, continue home PPI    Discharge Diagnoses:  Principal Problem:   Chest pain, rule out acute myocardial infarction Active Problems:   Status post coronary artery stent placement   Cardiomyopathy (HCC)   Hypertension   Atrial fibrillation (HCC)   CKD stage 3b, GFR 30-44 ml/min [facility])   Discharge Condition: stable  Diet recommendation: heart healthy  Filed Weights   11/02/23 0225 11/02/23 0624 11/03/23 0450  Weight: 83 kg 82.7 kg 82 kg    History of present illness:   Kristin [name] is Kristin [name] 82 y.o. female with medical history significant of Kristin [name].fib on eliquis, CAD s/p stent, CKD 3, HTN, HLD, stroke, ICM with EF 40-45%.   Pt had NSTEMI in 2021, didn't get stents until 9 months later in 2022 due to being post op from [facility] and not candidate for [facility] at the time in 2021.   CP in 06/2022 with neg trops, felt to be maybe GI related it seems?   Hadnt had CP since that time in 2023 until woke from sleep this AM with onset of CP.  Pressure like sensation, radiates to back.  8/10, feels like prior MI.  No associated SOB, nausea vomiting.  No tearing sensation.   Admitted for CP rule out.  Seen by cardiology.  Her home coreg has been increased.  Stable for discharge on 11/25.   Hospital Course:  Assessment and Plan:  Chest Pain rule out CAD s/p stent 05/2021 Appreciate cardiology assistance - d/c heparin gtt and nitroglycerin gtt.  resuming imdur and increasing coreg in setting of hypertension.  Her symptoms improved with GI cocktail.  Encouraged GI follow up outpatient.  Crestor   HFmrEF EF 45-50% 06/2021 Entresto,  farxiga, coreg Euvolemic   CKD IIIb Continue to monitor   Atrial Fibrillation Eliquis, coreg   Hypertension Coreg (increased dose), hydralazine, imdur, entresto     Procedures: none   Consultations: cardiology  Discharge Exam: Vitals:   11/03/23 0450 11/03/23 0725  BP: 135/66 (!) 151/100  Pulse: 87 71  Resp: 18 18  Temp: 98.6 F (37 C) 97.7 F (36.5 C)  SpO2: 99% 100%   No complaints Eager to discharge  General: No acute distress. Cardiovascular: RRR Lungs: unlabored Neurological: Alert and oriented 3. Moves all extremities 4 with equal strength. Cranial nerves II through XII grossly intact. Extremities: No clubbing or cyanosis. No edema.   Discharge Instructions   Discharge Instructions     Call MD for:  difficulty breathing, headache or visual disturbances   Complete by: As directed    Call MD for:  extreme fatigue   Complete by: As directed    Call MD for:  hives   Complete by: As directed    Call MD for:  persistant dizziness or light-headedness   Complete by: As directed    Call MD for:  persistant nausea and vomiting   Complete by: As directed    Call MD for:  redness, tenderness, or signs of infection (pain, swelling, redness, odor or green/yellow discharge around incision site)   Complete by: As directed    Call MD for:  severe uncontrolled pain   Complete by: As directed  Call MD for:  temperature >100.4   Complete by: As directed    Diet - low sodium heart healthy   Complete by: As directed    Discharge instructions   Complete by: As directed    You were seen for chest pain.    Your workup has been reassuring.  We've made some adjustments to your blood pressure medicine.  Your coreg has been increased to 12.5 mg twice Vianna Venezia day.  Since your symptoms improved with Adrean Heitz "GI cocktail" I think you seeing gastroenterology would be reasonable to pursue outpatient.  Return for new, recurrent, or worsening symptoms.  Please ask your PCP to  request records from this hospitalization so they know what was done and what the next steps will be.   Increase activity slowly   Complete by: As directed       Allergies as of 11/03/2023       Reactions   Pork-derived Products    No pork shrimp or lobster for religious beliefs   Tramadol Nausea And Vomiting        Medication List     TAKE these medications    apixaban 2.5 MG Tabs tablet Commonly known as: Eliquis Take 1 tablet (2.5 mg total) by mouth 2 (two) times daily.   carvedilol 12.5 MG tablet Commonly known as: COREG Take 1 tablet (12.5 mg total) by mouth 2 (two) times daily with Karissa Meenan meal. What changed:  medication strength how much to take   cholecalciferol 25 MCG (1000 UNIT) tablet Commonly known as: VITAMIN D3 Take 2,000 Units by mouth daily.   colchicine 0.6 MG tablet Take 0.5 tablets (0.3 mg total) by mouth daily.   cyclobenzaprine 10 MG tablet Commonly known as: FLEXERIL Take 0.5 tablets (5 mg total) by mouth 2 (two) times daily as needed for muscle spasms.   dapagliflozin propanediol 10 MG Tabs tablet Commonly known as: Farxiga Take 1 tablet (10 mg total) by mouth daily before breakfast.   diclofenac Sodium 1 % Gel Commonly known as: VOLTAREN Apply 2 g topically 4 (four) times daily.   Entresto 49-51 MG Generic drug: sacubitril-valsartan Take 1 tablet by mouth 2 (two) times daily.   ezetimibe 10 MG tablet Commonly known as: ZETIA Take 1 tablet by mouth once daily   hydrALAZINE 100 MG tablet Commonly known as: APRESOLINE Take 1 tablet (100 mg total) by mouth 3 (three) times daily.   HYDROcodone-acetaminophen 5-325 MG tablet Commonly known as: Norco Take 1 tablet by mouth every 4 (four) hours as needed for moderate pain.   isosorbide mononitrate 120 MG 24 hr tablet Commonly known as: IMDUR Take 1 tablet (120 mg total) by mouth daily.   nitroGLYCERIN 0.4 MG SL tablet Commonly known as: NITROSTAT Place 1 tablet (0.4 mg total) under the  tongue every 5 (five) minutes as needed for up to 30 doses for chest pain.   pantoprazole 40 MG tablet Commonly known as: PROTONIX Take 1 tablet (40 mg total) by mouth daily.   rosuvastatin 40 MG tablet Commonly known as: CRESTOR Take 1 tablet (40 mg total) by mouth at bedtime.       Allergies  Allergen Reactions   Pork-Derived Products     No pork shrimp or lobster for religious beliefs   Tramadol Nausea And Vomiting      The results of significant diagnostics from this hospitalization (including imaging, microbiology, ancillary and laboratory) are listed below for reference.    Significant Diagnostic Studies: DG Chest Portable 1 View  Result Date: 11/02/2023 CLINICAL DATA:  Chest pain. EXAM: PORTABLE CHEST 1 VIEW COMPARISON:  04/28/2023. FINDINGS: The heart size and mediastinal contours are within normal limits. There is atherosclerotic calcification of the aorta. Chronic elevation of the right diaphragm is noted. Minimal subsegmental atelectasis or scarring is noted in the mid left lung. No consolidation, effusion, or pneumothorax. No acute osseous abnormality is seen. IMPRESSION: No active disease. Electronically Signed   By: Thornell Sartorius M.D.   On: 11/02/2023 02:38    Microbiology: No results found for this or any previous visit (from the past 240 hour(s)).   Labs: Basic Metabolic Panel: Recent Labs  Lab 11/02/23 0228 11/03/23 0258  NA 136 137  K 3.9 4.6  CL 102 103  CO2 24 30  GLUCOSE 171* 98  BUN 20 24*  CREATININE 1.52* 1.46*  CALCIUM 9.2 9.0  MG  --  2.2  PHOS  --  2.9   Liver Function Tests: Recent Labs  Lab 11/03/23 0258  AST 40  ALT 35  ALKPHOS 92  BILITOT 0.4  PROT 6.1*  ALBUMIN 3.0*   No results for input(s): "LIPASE", "AMYLASE" in the last 168 hours. No results for input(s): "AMMONIA" in the last 168 hours. CBC: Recent Labs  Lab 11/02/23 0228 11/02/23 0806 11/03/23 0258  WBC 7.7 5.8 4.5  NEUTROABS  --   --  1.6*  HGB 11.2* 11.4*  10.5*  HCT 35.9* 36.4 34.5*  MCV 99.7 98.4 100.0  PLT 220 236 210   Cardiac Enzymes: No results for input(s): "CKTOTAL", "CKMB", "CKMBINDEX", "TROPONINI" in the last 168 hours. BNP: BNP (last 3 results) No results for input(s): "BNP" in the last 8760 hours.  ProBNP (last 3 results) No results for input(s): "PROBNP" in the last 8760 hours.  CBG: No results for input(s): "GLUCAP" in the last 168 hours.     Signed:  Lacretia Nicks MD.  [facility] 11/03/2023, 10:19 AM

## 2023-11-03 NOTE — Care Management Obs Status (Signed)
MEDICARE OBSERVATION STATUS NOTIFICATION   Patient Details  Name: Kristin [name] MRN: [id number] Date of Birth: 06/15/1941   Medicare Observation Status Notification Given:  Yes    Leone Haven, RN 11/03/2023, 11:27 AM

## 2023-11-21 DIAGNOSIS — H40013 Open angle with borderline findings, low risk, bilateral: Secondary | ICD-10-CM | POA: Diagnosis not present

## 2023-11-21 DIAGNOSIS — H04123 Dry eye syndrome of bilateral lacrimal glands: Secondary | ICD-10-CM | POA: Diagnosis not present

## 2023-11-21 DIAGNOSIS — E119 Type 2 diabetes mellitus without complications: Secondary | ICD-10-CM | POA: Diagnosis not present

## 2023-11-21 DIAGNOSIS — H02403 Unspecified ptosis of bilateral eyelids: Secondary | ICD-10-CM | POA: Diagnosis not present

## 2023-11-21 DIAGNOSIS — H524 Presbyopia: Secondary | ICD-10-CM | POA: Diagnosis not present

## 2023-11-22 DIAGNOSIS — E785 Hyperlipidemia, unspecified: Secondary | ICD-10-CM | POA: Diagnosis not present

## 2023-11-22 DIAGNOSIS — I252 Old myocardial infarction: Secondary | ICD-10-CM | POA: Diagnosis not present

## 2023-11-22 DIAGNOSIS — D6869 Other thrombophilia: Secondary | ICD-10-CM | POA: Diagnosis not present

## 2023-11-22 DIAGNOSIS — I129 Hypertensive chronic kidney disease with stage 1 through stage 4 chronic kidney disease, or unspecified chronic kidney disease: Secondary | ICD-10-CM | POA: Diagnosis not present

## 2023-11-22 DIAGNOSIS — M5430 Sciatica, unspecified side: Secondary | ICD-10-CM | POA: Diagnosis not present

## 2023-11-22 DIAGNOSIS — M109 Gout, unspecified: Secondary | ICD-10-CM | POA: Diagnosis not present

## 2023-11-22 DIAGNOSIS — K219 Gastro-esophageal reflux disease without esophagitis: Secondary | ICD-10-CM | POA: Diagnosis not present

## 2023-11-22 DIAGNOSIS — I4891 Unspecified atrial fibrillation: Secondary | ICD-10-CM | POA: Diagnosis not present

## 2023-11-22 DIAGNOSIS — N1832 Chronic kidney disease, stage 3b: Secondary | ICD-10-CM | POA: Diagnosis not present

## 2023-11-22 DIAGNOSIS — I2510 Atherosclerotic heart disease of native coronary artery without angina pectoris: Secondary | ICD-10-CM | POA: Diagnosis not present

## 2023-11-22 DIAGNOSIS — Z008 Encounter for other general examination: Secondary | ICD-10-CM | POA: Diagnosis not present

## 2023-11-22 DIAGNOSIS — M1990 Unspecified osteoarthritis, unspecified site: Secondary | ICD-10-CM | POA: Diagnosis not present

## 2023-11-22 DIAGNOSIS — M4800 Spinal stenosis, site unspecified: Secondary | ICD-10-CM | POA: Diagnosis not present

## 2023-11-24 DIAGNOSIS — M47816 Spondylosis without myelopathy or radiculopathy, lumbar region: Secondary | ICD-10-CM | POA: Diagnosis not present

## 2023-12-02 ENCOUNTER — Other Ambulatory Visit (HOSPITAL_COMMUNITY): Payer: Self-pay

## 2023-12-02 ENCOUNTER — Other Ambulatory Visit: Payer: Self-pay | Admitting: Nurse Practitioner

## 2023-12-02 DIAGNOSIS — I2510 Atherosclerotic heart disease of native coronary artery without angina pectoris: Secondary | ICD-10-CM

## 2024-01-01 ENCOUNTER — Telehealth: Payer: Self-pay | Admitting: *Deleted

## 2024-01-01 NOTE — Telephone Encounter (Signed)
   Pre-operative Risk Assessment    Patient Name: Kristin [name]  DOB: 05-11-1941 MRN: [id number]   Date of last office visit: 10/10/23 Robin Searing, NP Date of next office visit: NONE   Request for Surgical Clearance    Procedure:  B/L UPPER EYELID BLEPHAROPTOSIS REPAIR INTERNAL APPROACH 9.5 MM  Date of Surgery:  Clearance 01/14/24                                Surgeon:  DR. TAL RUBINSTEIN Surgeon's Group or Practice Name:  [facility]  Phone number:  [phone] Fax number:  [phone]   Type of Clearance Requested:   - Medical  - Pharmacy:  Hold Apixaban (Eliquis)     Type of Anesthesia:  MAC   Additional requests/questions:    Elpidio Anis   01/01/2024, 4:17 PM

## 2024-01-01 NOTE — Telephone Encounter (Signed)
Pharmacy please advise on holding Eliquis prior to bilateral upper lid blepharoplasty with ptosis repair scheduled for 09/12/2024. Thank you.

## 2024-01-02 ENCOUNTER — Telehealth: Payer: Self-pay | Admitting: *Deleted

## 2024-01-02 NOTE — Telephone Encounter (Signed)
Patient with diagnosis of afib on Eliquis for anticoagulation.    Procedure:  B/L UPPER EYELID BLEPHAROPTOSIS REPAIR INTERNAL APPROACH 9.5 MM  Date of procedure: 01/14/2024   CHA2DS2-VASc Score = 9   This indicates a 12.2% annual risk of stroke. The patient's score is based upon: CHF History: 1 HTN History: 1 Diabetes History: 1 Stroke History: 2 Vascular Disease History: 1 Age Score: 2 Gender Score: 1     CrCl 38 mL/min Platelet count 210 K  Recommend that patient hold Xarelto only 1 day prior to procedure due to elevated  CV risk     **This guidance is not considered finalized until pre-operative APP has relayed final recommendations.**

## 2024-01-02 NOTE — Telephone Encounter (Signed)
Patient Scheduled 01-09-24

## 2024-01-02 NOTE — Telephone Encounter (Signed)
   Name: Kristin [name]  DOB: [birth year]  MRN: [id number]  Primary Cardiologist: Tessa Lerner, DO   Preoperative team, please contact this patient and set up a phone call appointment for further preoperative risk assessment. Please obtain consent and complete medication review. Thank you for your help.  I confirm that guidance regarding antiplatelet and oral anticoagulation therapy has been completed and, if necessary, noted below.  Recommend that patient hold Xarelto only 1 day prior to procedure due to elevated  CV risk. Please resume [name] as soon as possible postprocedure, at the discretion of the surgeon.    I also confirmed the patient resides in the state of [place]. As per [facility] Medical Board telemedicine laws, the patient must reside in the state in which the provider is licensed.   Joylene Grapes, NP 01/02/2024, 8:00 AM [facility]

## 2024-01-02 NOTE — Telephone Encounter (Signed)
  Patient Consent for Virtual Visit         Kristin [name] has provided verbal consent on 01/02/2024 for a virtual visit (video or telephone).   CONSENT FOR VIRTUAL VISIT FOR:  Kristin [name]  By participating in this virtual visit I agree to the following:  I hereby voluntarily request, consent and authorize [facility] and its employed or contracted physicians, physician assistants, nurse practitioners or other licensed health care professionals (the Practitioner), to provide me with telemedicine health care services (the "Services") as deemed necessary by the treating Practitioner. I acknowledge and consent to receive the Services by the Practitioner via telemedicine. I understand that the telemedicine visit will involve communicating with the Practitioner through live audiovisual communication technology and the disclosure of certain medical information by electronic transmission. I acknowledge that I have been given the opportunity to request an in-person assessment or other available alternative prior to the telemedicine visit and am voluntarily participating in the telemedicine visit.  I understand that I have the right to withhold or withdraw my consent to the use of telemedicine in the course of my care at any time, without affecting my right to future care or treatment, and that the Practitioner or I may terminate the telemedicine visit at any time. I understand that I have the right to inspect all information obtained and/or recorded in the course of the telemedicine visit and may receive copies of available information for a reasonable fee.  I understand that some of the potential risks of receiving the Services via telemedicine include:  Delay or interruption in medical evaluation due to technological equipment failure or disruption; Information transmitted may not be sufficient (e.g. poor resolution of images) to allow for appropriate medical decision making by the  Practitioner; and/or  In rare instances, security protocols could fail, causing a breach of personal health information.  Furthermore, I acknowledge that it is my responsibility to provide information about my medical history, conditions and care that is complete and accurate to the best of my ability. I acknowledge that Practitioner's advice, recommendations, and/or decision may be based on factors not within their control, such as incomplete or inaccurate data provided by me or distortions of diagnostic images or specimens that may result from electronic transmissions. I understand that the practice of medicine is not an exact science and that Practitioner makes no warranties or guarantees regarding treatment outcomes. I acknowledge that a copy of this consent can be made available to me via my patient portal [facility] MyChart), or I can request a printed copy by calling the office of [facility].    I understand that my insurance will be billed for this visit.   I have read or had this consent read to me. I understand the contents of this consent, which adequately explains the benefits and risks of the Services being provided via telemedicine.  I have been provided ample opportunity to ask questions regarding this consent and the Services and have had my questions answered to my satisfaction. I give my informed consent for the services to be provided through the use of telemedicine in my medical care

## 2024-01-04 ENCOUNTER — Other Ambulatory Visit: Payer: Self-pay | Admitting: Nurse Practitioner

## 2024-01-04 DIAGNOSIS — I2510 Atherosclerotic heart disease of native coronary artery without angina pectoris: Secondary | ICD-10-CM

## 2024-01-09 ENCOUNTER — Ambulatory Visit: Payer: Medicare HMO | Attending: Cardiology

## 2024-01-09 DIAGNOSIS — Z01810 Encounter for preprocedural cardiovascular examination: Secondary | ICD-10-CM

## 2024-01-09 NOTE — Telephone Encounter (Signed)
Pharmacy clearance was noted for Xarelto, however patient on Eliquis.  Reviewed information, patient had stroke in 2021.    Ok to hold Eliquis for 1 day prior to procedure.

## 2024-01-09 NOTE — Progress Notes (Signed)
Virtual Visit via Telephone Note   Because of Starlena Beil co-morbid illnesses, she is at least at moderate risk for complications without adequate follow up.  This format is felt to be most appropriate for this patient at this time.  The patient did not have access to video technology/had technical difficulties with video requiring transitioning to audio format only (telephone).  All issues noted in this document were discussed and addressed.  No physical exam could be performed with this format.  Please refer to the patient's chart for her consent to telehealth for [facility].  Evaluation Performed:  Preoperative cardiovascular risk assessment _____________   Date:  01/09/2024   Patient ID:  Kristin [name], DOB May 15, 1941, MRN [id number] Patient Location:  Home Provider location:   Office  Primary Care Provider:  Aliene Beams, MD Primary Cardiologist:  Tessa Lerner, DO  Chief Complaint / Patient Profile   83 y.o. y/o female with a h/o coronary artery disease, paroxysmal atrial fibrillation, hyperlipidemia, chronic combined systolic and diastolic CHF, type 2 diabetes who is pending bilateral upper eyelid BLEPHAROPTOSIS  and presents today for telephonic preoperative cardiovascular risk assessment.  History of Present Illness    Kristin [name] is a 83 y.o. female who presents via audio/video conferencing for a telehealth visit today.  Pt was last seen in cardiology clinic on 10/10/2023 by Robin Searing, NP.  At that time Kristin [name] was doing well .  The patient is now pending procedure as outlined above. Since her last visit, she was seen in the emergency department on 11/02/2023.  She received GI cocktail and her symptoms resolved.  Cardiac troponins were negative.  There were no signs of ischemia.  There were no plans made for ischemic evaluation.  Her blood pressure medication was uptitrated.  She remained stable from a cardiac standpoint.  Today she denies  chest pain, shortness of breath, lower extremity edema, fatigue, palpitations, melena, hematuria, hemoptysis, diaphoresis, weakness, presyncope, syncope, orthopnea, and PND.   Past Medical History    Past Medical History:  Diagnosis Date   Atrial fibrillation (HCC)    CHF (congestive heart failure) (HCC)    Coronary artery disease    Diabetes mellitus without complication (HCC)    Hypercholesteremia    Hypertension    Renal disorder    CKD Stage IV   Stroke [facility])    Past Surgical History:  Procedure Laterality Date   CORONARY STENT INTERVENTION N/A 05/15/2021   Procedure: CORONARY STENT INTERVENTION;  Surgeon: Elder Negus, MD;  Location: [facility] INVASIVE CV LAB;  Service: Cardiovascular;  Laterality: N/A;   LEFT HEART CATH AND CORONARY ANGIOGRAPHY N/A 05/11/2021   Procedure: LEFT HEART CATH AND CORONARY ANGIOGRAPHY;  Surgeon: Elder Negus, MD;  Location: [facility] INVASIVE CV LAB;  Service: Cardiovascular;  Laterality: N/A;   LUMBAR LAMINECTOMY/DECOMPRESSION MICRODISCECTOMY N/A 08/19/2020   Procedure: LUMBAR LAMINECTOMY/DECOMPRESSION  Lumbar two-three, Lumbar three-four;  Surgeon: Dawley, Alan Mulder, DO;  Location: [facility] OR;  Service: Neurosurgery;  Laterality: N/A;   RIGHT/LEFT HEART CATH AND CORONARY ANGIOGRAPHY N/A 08/23/2020   Procedure: RIGHT/LEFT HEART CATH AND CORONARY ANGIOGRAPHY;  Surgeon: Yates Decamp, MD;  Location: [facility] INVASIVE CV LAB;  Service: Cardiovascular;  Laterality: N/A;    Allergies  Allergies  Allergen Reactions   Pork-Derived Products     No pork shrimp or lobster for religious beliefs   Tramadol Nausea And Vomiting    Home Medications    Prior to Admission medications   Medication Sig Start Date End Date  Taking? Authorizing Provider  apixaban (ELIQUIS) 2.5 MG TABS tablet Take 1 tablet (2.5 mg total) by mouth 2 (two) times daily. 10/10/23   Gaston Islam., NP  carvedilol (COREG) 12.5 MG tablet Take 1 tablet (12.5 mg total) by mouth 2 (two) times daily with a  meal. 11/03/23 12/03/23  Zigmund Daniel., MD  cholecalciferol (VITAMIN D) 25 MCG (1000 UNIT) tablet Take 2,000 Units by mouth daily.    [provider]  colchicine 0.6 MG tablet Take 0.5 tablets (0.3 mg total) by mouth daily. 08/31/20   Glade Lloyd, MD  cyclobenzaprine (FLEXERIL) 10 MG tablet Take 0.5 tablets (5 mg total) by mouth 2 (two) times daily as needed for muscle spasms. 09/02/23   Elpidio Anis, PA-C  dapagliflozin propanediol (FARXIGA) 10 MG TABS tablet Take 1 tablet (10 mg total) by mouth daily before breakfast. 10/10/23   Gaston Islam., NP  diclofenac Sodium (VOLTAREN) 1 % GEL Apply 2 g topically 4 (four) times daily. 08/30/20   Glade Lloyd, MD  ezetimibe (ZETIA) 10 MG tablet Take 1 tablet by mouth once daily 10/06/23   Tolia, Sunit, DO  hydrALAZINE (APRESOLINE) 100 MG tablet Take 1 tablet (100 mg total) by mouth 3 (three) times daily. 10/10/23   Gaston Islam., NP  HYDROcodone-acetaminophen (NORCO) 5-325 MG tablet Take 1 tablet by mouth every 4 (four) hours as needed for moderate pain. 09/02/23   Elpidio Anis, PA-C  isosorbide mononitrate (IMDUR) 120 MG 24 hr tablet Take 1 tablet by mouth once daily 01/06/24   Gaston Islam., NP  nitroGLYCERIN (NITROSTAT) 0.4 MG SL tablet Place 1 tablet (0.4 mg total) under the tongue every 5 (five) minutes as needed for up to 30 doses for chest pain. 08/02/22   Tolia, Sunit, DO  pantoprazole (PROTONIX) 40 MG tablet Take 1 tablet (40 mg total) by mouth daily. 08/30/20   Glade Lloyd, MD  rosuvastatin (CRESTOR) 40 MG tablet Take 1 tablet (40 mg total) by mouth at bedtime. 10/10/23   Gaston Islam., NP  sacubitril-valsartan (ENTRESTO) 49-51 MG Take 1 tablet by mouth 2 (two) times daily. 10/10/23   Gaston Islam., NP    Physical Exam    Vital Signs:  Kristin [name] does not have vital signs available for review today.  Given telephonic nature of communication, physical exam is limited. AAOx3. NAD. Normal affect.   Speech and respirations are unlabored.  Accessory Clinical Findings    None  Assessment & Plan    1.  Preoperative Cardiovascular Risk Assessment: B/L UPPER EYELID BLEPHAROPTOSIS REPAIR INTERNAL APPROACH 9.5 MM   Date of Surgery:  Clearance 01/14/24                                  Surgeon:  DR. TAL RUBINSTEIN Surgeon's Group or Practice Name:  [facility]  Phone number:  [phone] Fax number:  [phone]      Primary Cardiologist: Tessa Lerner, DO  Chart reviewed as part of pre-operative protocol coverage. Given past medical history and time since last visit, based on ACC/AHA guidelines, Kristin [name] would be at acceptable risk for the planned procedure without further cardiovascular testing.   Her RCRI is high risk, greater than 11% risk of major cardiac event.  She is able to complete greater than 4 METS of physical activity.  Recommend that patient hold Eliquis only 1 day prior to procedure due  to elevated  CV risk. Please resume Eliquis as soon as possible postprocedure, at the discretion of the surgeon.   Patient was advised that if she develops new symptoms prior to surgery to contact our office to arrange a follow-up appointment.  He verbalized understanding.  I will route this recommendation to the requesting party via Epic fax function and remove from pre-op pool.       Time:   Today, I have spent 7 minutes with the patient with telehealth technology discussing medical history, symptoms, and management plan.     Kristin Asters, NP  01/09/2024, 7:18 AM

## 2024-01-16 DIAGNOSIS — N1832 Chronic kidney disease, stage 3b: Secondary | ICD-10-CM | POA: Diagnosis not present

## 2024-01-23 DIAGNOSIS — I5020 Unspecified systolic (congestive) heart failure: Secondary | ICD-10-CM | POA: Diagnosis not present

## 2024-01-23 DIAGNOSIS — I129 Hypertensive chronic kidney disease with stage 1 through stage 4 chronic kidney disease, or unspecified chronic kidney disease: Secondary | ICD-10-CM | POA: Diagnosis not present

## 2024-01-23 DIAGNOSIS — N1832 Chronic kidney disease, stage 3b: Secondary | ICD-10-CM | POA: Diagnosis not present

## 2024-01-23 DIAGNOSIS — I639 Cerebral infarction, unspecified: Secondary | ICD-10-CM | POA: Diagnosis not present

## 2024-01-23 DIAGNOSIS — G894 Chronic pain syndrome: Secondary | ICD-10-CM | POA: Diagnosis not present

## 2024-01-23 DIAGNOSIS — E1122 Type 2 diabetes mellitus with diabetic chronic kidney disease: Secondary | ICD-10-CM | POA: Diagnosis not present

## 2024-01-25 ENCOUNTER — Other Ambulatory Visit: Payer: Self-pay | Admitting: Nurse Practitioner

## 2024-01-25 DIAGNOSIS — I5042 Chronic combined systolic (congestive) and diastolic (congestive) heart failure: Secondary | ICD-10-CM

## 2024-01-30 ENCOUNTER — Telehealth: Payer: Self-pay | Admitting: Cardiology

## 2024-01-30 DIAGNOSIS — N184 Chronic kidney disease, stage 4 (severe): Secondary | ICD-10-CM | POA: Diagnosis not present

## 2024-01-30 DIAGNOSIS — I6930 Unspecified sequelae of cerebral infarction: Secondary | ICD-10-CM | POA: Diagnosis not present

## 2024-01-30 DIAGNOSIS — I480 Paroxysmal atrial fibrillation: Secondary | ICD-10-CM | POA: Diagnosis not present

## 2024-01-30 DIAGNOSIS — E118 Type 2 diabetes mellitus with unspecified complications: Secondary | ICD-10-CM | POA: Diagnosis not present

## 2024-01-30 DIAGNOSIS — I25118 Atherosclerotic heart disease of native coronary artery with other forms of angina pectoris: Secondary | ICD-10-CM | POA: Diagnosis not present

## 2024-01-30 DIAGNOSIS — M1A9XX0 Chronic gout, unspecified, without tophus (tophi): Secondary | ICD-10-CM | POA: Diagnosis not present

## 2024-01-30 DIAGNOSIS — D6869 Other thrombophilia: Secondary | ICD-10-CM | POA: Diagnosis not present

## 2024-01-30 DIAGNOSIS — E782 Mixed hyperlipidemia: Secondary | ICD-10-CM | POA: Diagnosis not present

## 2024-01-30 DIAGNOSIS — I5042 Chronic combined systolic (congestive) and diastolic (congestive) heart failure: Secondary | ICD-10-CM | POA: Diagnosis not present

## 2024-01-30 DIAGNOSIS — R350 Frequency of micturition: Secondary | ICD-10-CM | POA: Diagnosis not present

## 2024-01-30 DIAGNOSIS — I10 Essential (primary) hypertension: Secondary | ICD-10-CM | POA: Diagnosis not present

## 2024-01-30 DIAGNOSIS — I252 Old myocardial infarction: Secondary | ICD-10-CM | POA: Diagnosis not present

## 2024-01-30 DIAGNOSIS — H35033 Hypertensive retinopathy, bilateral: Secondary | ICD-10-CM | POA: Diagnosis not present

## 2024-01-30 NOTE — Telephone Encounter (Signed)
 Spoke with pt's daughter over the phone per DPR on file and explained that I sent a MyChart message with PA information for Entresto. Pt's daughter verbalized understanding of information given and had no further questions.

## 2024-01-30 NOTE — Telephone Encounter (Signed)
 Pt c/o medication issue:  1. Name of Medication: sacubitril-valsartan (ENTRESTO) 49-51 MG   2. How are you currently taking this medication (dosage and times per day)? 1 tablet daily  3. Are you having a reaction (difficulty breathing--STAT)? No   4. What is your medication issue? Patient's daughter states the patient's insurance won't pay for the medication anymore. She would like to know if there is a generic option or if the office can contact her insurance company.Phone: [phone]

## 2024-02-05 ENCOUNTER — Encounter: Payer: Self-pay | Admitting: Cardiology

## 2024-02-05 ENCOUNTER — Other Ambulatory Visit: Payer: Self-pay

## 2024-02-05 DIAGNOSIS — N184 Chronic kidney disease, stage 4 (severe): Secondary | ICD-10-CM | POA: Diagnosis not present

## 2024-02-05 DIAGNOSIS — Z9181 History of falling: Secondary | ICD-10-CM | POA: Diagnosis not present

## 2024-02-05 DIAGNOSIS — I5042 Chronic combined systolic (congestive) and diastolic (congestive) heart failure: Secondary | ICD-10-CM | POA: Diagnosis not present

## 2024-02-05 DIAGNOSIS — I252 Old myocardial infarction: Secondary | ICD-10-CM | POA: Diagnosis not present

## 2024-02-05 DIAGNOSIS — R350 Frequency of micturition: Secondary | ICD-10-CM | POA: Diagnosis not present

## 2024-02-05 DIAGNOSIS — Z742 Need for assistance at home and no other household member able to render care: Secondary | ICD-10-CM | POA: Diagnosis not present

## 2024-02-05 DIAGNOSIS — M545 Low back pain, unspecified: Secondary | ICD-10-CM | POA: Diagnosis not present

## 2024-02-05 DIAGNOSIS — E559 Vitamin D deficiency, unspecified: Secondary | ICD-10-CM | POA: Diagnosis not present

## 2024-02-05 DIAGNOSIS — I480 Paroxysmal atrial fibrillation: Secondary | ICD-10-CM | POA: Diagnosis not present

## 2024-02-05 DIAGNOSIS — M170 Bilateral primary osteoarthritis of knee: Secondary | ICD-10-CM | POA: Diagnosis not present

## 2024-02-05 DIAGNOSIS — I6930 Unspecified sequelae of cerebral infarction: Secondary | ICD-10-CM | POA: Diagnosis not present

## 2024-02-05 DIAGNOSIS — E1122 Type 2 diabetes mellitus with diabetic chronic kidney disease: Secondary | ICD-10-CM | POA: Diagnosis not present

## 2024-02-05 DIAGNOSIS — Z556 Problems related to health literacy: Secondary | ICD-10-CM | POA: Diagnosis not present

## 2024-02-05 DIAGNOSIS — I130 Hypertensive heart and chronic kidney disease with heart failure and stage 1 through stage 4 chronic kidney disease, or unspecified chronic kidney disease: Secondary | ICD-10-CM | POA: Diagnosis not present

## 2024-02-05 DIAGNOSIS — E782 Mixed hyperlipidemia: Secondary | ICD-10-CM | POA: Diagnosis not present

## 2024-02-05 DIAGNOSIS — G894 Chronic pain syndrome: Secondary | ICD-10-CM | POA: Diagnosis not present

## 2024-02-05 DIAGNOSIS — H35033 Hypertensive retinopathy, bilateral: Secondary | ICD-10-CM | POA: Diagnosis not present

## 2024-02-05 DIAGNOSIS — D6869 Other thrombophilia: Secondary | ICD-10-CM | POA: Diagnosis not present

## 2024-02-05 DIAGNOSIS — Z7901 Long term (current) use of anticoagulants: Secondary | ICD-10-CM | POA: Diagnosis not present

## 2024-02-05 DIAGNOSIS — Z7984 Long term (current) use of oral hypoglycemic drugs: Secondary | ICD-10-CM | POA: Diagnosis not present

## 2024-02-05 DIAGNOSIS — I25118 Atherosclerotic heart disease of native coronary artery with other forms of angina pectoris: Secondary | ICD-10-CM | POA: Diagnosis not present

## 2024-02-05 DIAGNOSIS — M1A9XX0 Chronic gout, unspecified, without tophus (tophi): Secondary | ICD-10-CM | POA: Diagnosis not present

## 2024-02-05 MED ORDER — CARVEDILOL 12.5 MG PO TABS: 12.5000 mg | ORAL_TABLET | Freq: Two times a day (BID) | ORAL | 3 refills | Status: DC

## 2024-02-05 NOTE — Telephone Encounter (Signed)
 Yes. Please refill.   Ausencio Vaden [place], DO, [facility]

## 2024-02-06 ENCOUNTER — Other Ambulatory Visit: Payer: Self-pay

## 2024-02-06 ENCOUNTER — Telehealth: Payer: Self-pay | Admitting: Cardiology

## 2024-02-06 DIAGNOSIS — I480 Paroxysmal atrial fibrillation: Secondary | ICD-10-CM

## 2024-02-06 DIAGNOSIS — Z7901 Long term (current) use of anticoagulants: Secondary | ICD-10-CM

## 2024-02-06 NOTE — Telephone Encounter (Signed)
 New message  [employer] patient assistance application was put in Dr Emelda Brothers box today.

## 2024-02-06 NOTE — Telephone Encounter (Addendum)
 Eliquis 2.5mg  refill request received. Patient is 83 years old, weight-82kg, Crea-1.46 on 11/03/23 & 1.32 on 02/01/24 & prior has been 1.50 on 07/04/22 via Care Everywhere from [place], [place] & [facility], and last seen by Edd Fabian on 01/09/24 via Tele-Medicine. Dose is inappropriate based on dosing criteria.   Pharm D to evaluate doseage, pt does has stage 4 chronic kidney disease

## 2024-02-06 NOTE — Telephone Encounter (Signed)
 Called patient's home health nurse and her daughter. Patient started back on her carvedilol earlier today. They will monitor BP and give our office a call if BP does not start to trend down.

## 2024-02-06 NOTE — Telephone Encounter (Signed)
 New Message:    Marisue Ivan was at patient's home on yesterday(02-05-24). She wanted to report her blood pressure reading.   Pt c/o BP issue:  1. What are your last 5 BP readings? 169/91 20 minutes later it was 170/98 2. Are you having any other symptoms (ex. Dizziness, headache, blurred vision, passed out)?  No symptoms   3. What is your medication issue? Nurse wanted to notify you of the high blood pressure readings. Patient said she felt that her blood pressure was high, because she had not taken her Carvedilol for 2 days, because she ran out of her medicine. Marisue Ivan said the daughter was going to pick up the Carvedilol yesterday.

## 2024-02-06 NOTE — Telephone Encounter (Signed)
 Pt is requesting a refill of Eliquis to be sent to [facility].

## 2024-02-09 ENCOUNTER — Other Ambulatory Visit (HOSPITAL_COMMUNITY): Payer: Self-pay

## 2024-02-09 ENCOUNTER — Telehealth: Payer: Self-pay

## 2024-02-09 MED ORDER — APIXABAN 2.5 MG PO TABS: 2.5000 mg | ORAL_TABLET | Freq: Two times a day (BID) | ORAL | 0 refills | Status: DC

## 2024-02-09 NOTE — Telephone Encounter (Signed)
 Kristin [name], [facility] to Me    02/09/24  3:48 PM  Lets keep her on 2.5 but just do a 90 day supply    See response above from [place], pharmD regarding eliquis dose/refill.

## 2024-02-09 NOTE — Telephone Encounter (Signed)
 Patient Advocate Encounter   The patient was approved for a [employer] grant that will help cover the cost of ENTRESTO Total amount awarded, $10,000.  Effective: 01/10/24 - 01/08/25   [id number] [id number]   Pharmacy provided with approval and processing information. Patient informed via Dorcas Carrow, CPhT  Pharmacy Patient Advocate Specialist  Direct Number: [phone] Fax: [phone]

## 2024-02-11 DIAGNOSIS — Z9181 History of falling: Secondary | ICD-10-CM | POA: Diagnosis not present

## 2024-02-11 DIAGNOSIS — I25118 Atherosclerotic heart disease of native coronary artery with other forms of angina pectoris: Secondary | ICD-10-CM | POA: Diagnosis not present

## 2024-02-11 DIAGNOSIS — M545 Low back pain, unspecified: Secondary | ICD-10-CM | POA: Diagnosis not present

## 2024-02-11 DIAGNOSIS — M1A9XX0 Chronic gout, unspecified, without tophus (tophi): Secondary | ICD-10-CM | POA: Diagnosis not present

## 2024-02-11 DIAGNOSIS — Z7984 Long term (current) use of oral hypoglycemic drugs: Secondary | ICD-10-CM | POA: Diagnosis not present

## 2024-02-11 DIAGNOSIS — Z556 Problems related to health literacy: Secondary | ICD-10-CM | POA: Diagnosis not present

## 2024-02-11 DIAGNOSIS — E1122 Type 2 diabetes mellitus with diabetic chronic kidney disease: Secondary | ICD-10-CM | POA: Diagnosis not present

## 2024-02-11 DIAGNOSIS — M170 Bilateral primary osteoarthritis of knee: Secondary | ICD-10-CM | POA: Diagnosis not present

## 2024-02-11 DIAGNOSIS — I252 Old myocardial infarction: Secondary | ICD-10-CM | POA: Diagnosis not present

## 2024-02-11 DIAGNOSIS — I480 Paroxysmal atrial fibrillation: Secondary | ICD-10-CM | POA: Diagnosis not present

## 2024-02-11 DIAGNOSIS — I130 Hypertensive heart and chronic kidney disease with heart failure and stage 1 through stage 4 chronic kidney disease, or unspecified chronic kidney disease: Secondary | ICD-10-CM | POA: Diagnosis not present

## 2024-02-11 DIAGNOSIS — R350 Frequency of micturition: Secondary | ICD-10-CM | POA: Diagnosis not present

## 2024-02-11 DIAGNOSIS — Z742 Need for assistance at home and no other household member able to render care: Secondary | ICD-10-CM | POA: Diagnosis not present

## 2024-02-11 DIAGNOSIS — Z7901 Long term (current) use of anticoagulants: Secondary | ICD-10-CM | POA: Diagnosis not present

## 2024-02-11 DIAGNOSIS — I5042 Chronic combined systolic (congestive) and diastolic (congestive) heart failure: Secondary | ICD-10-CM | POA: Diagnosis not present

## 2024-02-11 DIAGNOSIS — H35033 Hypertensive retinopathy, bilateral: Secondary | ICD-10-CM | POA: Diagnosis not present

## 2024-02-11 DIAGNOSIS — E559 Vitamin D deficiency, unspecified: Secondary | ICD-10-CM | POA: Diagnosis not present

## 2024-02-11 DIAGNOSIS — G894 Chronic pain syndrome: Secondary | ICD-10-CM | POA: Diagnosis not present

## 2024-02-11 DIAGNOSIS — E782 Mixed hyperlipidemia: Secondary | ICD-10-CM | POA: Diagnosis not present

## 2024-02-11 DIAGNOSIS — N184 Chronic kidney disease, stage 4 (severe): Secondary | ICD-10-CM | POA: Diagnosis not present

## 2024-02-11 DIAGNOSIS — I6930 Unspecified sequelae of cerebral infarction: Secondary | ICD-10-CM | POA: Diagnosis not present

## 2024-02-11 DIAGNOSIS — D6869 Other thrombophilia: Secondary | ICD-10-CM | POA: Diagnosis not present

## 2024-02-16 ENCOUNTER — Telehealth: Payer: Self-pay

## 2024-02-16 DIAGNOSIS — Z7984 Long term (current) use of oral hypoglycemic drugs: Secondary | ICD-10-CM | POA: Diagnosis not present

## 2024-02-16 DIAGNOSIS — M170 Bilateral primary osteoarthritis of knee: Secondary | ICD-10-CM | POA: Diagnosis not present

## 2024-02-16 DIAGNOSIS — I5042 Chronic combined systolic (congestive) and diastolic (congestive) heart failure: Secondary | ICD-10-CM | POA: Diagnosis not present

## 2024-02-16 DIAGNOSIS — E1122 Type 2 diabetes mellitus with diabetic chronic kidney disease: Secondary | ICD-10-CM | POA: Diagnosis not present

## 2024-02-16 DIAGNOSIS — I25118 Atherosclerotic heart disease of native coronary artery with other forms of angina pectoris: Secondary | ICD-10-CM | POA: Diagnosis not present

## 2024-02-16 DIAGNOSIS — Z7901 Long term (current) use of anticoagulants: Secondary | ICD-10-CM | POA: Diagnosis not present

## 2024-02-16 DIAGNOSIS — E559 Vitamin D deficiency, unspecified: Secondary | ICD-10-CM | POA: Diagnosis not present

## 2024-02-16 DIAGNOSIS — I480 Paroxysmal atrial fibrillation: Secondary | ICD-10-CM | POA: Diagnosis not present

## 2024-02-16 DIAGNOSIS — E782 Mixed hyperlipidemia: Secondary | ICD-10-CM | POA: Diagnosis not present

## 2024-02-16 DIAGNOSIS — I6930 Unspecified sequelae of cerebral infarction: Secondary | ICD-10-CM | POA: Diagnosis not present

## 2024-02-16 DIAGNOSIS — H35033 Hypertensive retinopathy, bilateral: Secondary | ICD-10-CM | POA: Diagnosis not present

## 2024-02-16 DIAGNOSIS — I130 Hypertensive heart and chronic kidney disease with heart failure and stage 1 through stage 4 chronic kidney disease, or unspecified chronic kidney disease: Secondary | ICD-10-CM | POA: Diagnosis not present

## 2024-02-16 DIAGNOSIS — Z742 Need for assistance at home and no other household member able to render care: Secondary | ICD-10-CM | POA: Diagnosis not present

## 2024-02-16 DIAGNOSIS — Z556 Problems related to health literacy: Secondary | ICD-10-CM | POA: Diagnosis not present

## 2024-02-16 DIAGNOSIS — G894 Chronic pain syndrome: Secondary | ICD-10-CM | POA: Diagnosis not present

## 2024-02-16 DIAGNOSIS — M1A9XX0 Chronic gout, unspecified, without tophus (tophi): Secondary | ICD-10-CM | POA: Diagnosis not present

## 2024-02-16 DIAGNOSIS — D6869 Other thrombophilia: Secondary | ICD-10-CM | POA: Diagnosis not present

## 2024-02-16 DIAGNOSIS — M545 Low back pain, unspecified: Secondary | ICD-10-CM | POA: Diagnosis not present

## 2024-02-16 DIAGNOSIS — I252 Old myocardial infarction: Secondary | ICD-10-CM | POA: Diagnosis not present

## 2024-02-16 DIAGNOSIS — N184 Chronic kidney disease, stage 4 (severe): Secondary | ICD-10-CM | POA: Diagnosis not present

## 2024-02-16 DIAGNOSIS — R350 Frequency of micturition: Secondary | ICD-10-CM | POA: Diagnosis not present

## 2024-02-16 DIAGNOSIS — Z9181 History of falling: Secondary | ICD-10-CM | POA: Diagnosis not present

## 2024-02-16 NOTE — Telephone Encounter (Signed)
 RECEIVED, REVIEWING

## 2024-02-17 ENCOUNTER — Other Ambulatory Visit (HOSPITAL_COMMUNITY): Payer: Self-pay

## 2024-02-17 ENCOUNTER — Other Ambulatory Visit: Payer: Self-pay

## 2024-02-17 MED ORDER — APIXABAN 2.5 MG PO TABS: 2.5000 mg | ORAL_TABLET | Freq: Two times a day (BID) | ORAL | 0 refills | Status: DC

## 2024-02-18 ENCOUNTER — Other Ambulatory Visit (HOSPITAL_COMMUNITY): Payer: Self-pay

## 2024-02-18 DIAGNOSIS — I5042 Chronic combined systolic (congestive) and diastolic (congestive) heart failure: Secondary | ICD-10-CM | POA: Diagnosis not present

## 2024-02-18 DIAGNOSIS — I252 Old myocardial infarction: Secondary | ICD-10-CM | POA: Diagnosis not present

## 2024-02-18 DIAGNOSIS — Z556 Problems related to health literacy: Secondary | ICD-10-CM | POA: Diagnosis not present

## 2024-02-18 DIAGNOSIS — M545 Low back pain, unspecified: Secondary | ICD-10-CM | POA: Diagnosis not present

## 2024-02-18 DIAGNOSIS — I480 Paroxysmal atrial fibrillation: Secondary | ICD-10-CM | POA: Diagnosis not present

## 2024-02-18 DIAGNOSIS — M170 Bilateral primary osteoarthritis of knee: Secondary | ICD-10-CM | POA: Diagnosis not present

## 2024-02-18 DIAGNOSIS — E782 Mixed hyperlipidemia: Secondary | ICD-10-CM | POA: Diagnosis not present

## 2024-02-18 DIAGNOSIS — Z742 Need for assistance at home and no other household member able to render care: Secondary | ICD-10-CM | POA: Diagnosis not present

## 2024-02-18 DIAGNOSIS — N184 Chronic kidney disease, stage 4 (severe): Secondary | ICD-10-CM | POA: Diagnosis not present

## 2024-02-18 DIAGNOSIS — M1A9XX0 Chronic gout, unspecified, without tophus (tophi): Secondary | ICD-10-CM | POA: Diagnosis not present

## 2024-02-18 DIAGNOSIS — G894 Chronic pain syndrome: Secondary | ICD-10-CM | POA: Diagnosis not present

## 2024-02-18 DIAGNOSIS — I6930 Unspecified sequelae of cerebral infarction: Secondary | ICD-10-CM | POA: Diagnosis not present

## 2024-02-18 DIAGNOSIS — E559 Vitamin D deficiency, unspecified: Secondary | ICD-10-CM | POA: Diagnosis not present

## 2024-02-18 DIAGNOSIS — D6869 Other thrombophilia: Secondary | ICD-10-CM | POA: Diagnosis not present

## 2024-02-18 DIAGNOSIS — I25118 Atherosclerotic heart disease of native coronary artery with other forms of angina pectoris: Secondary | ICD-10-CM | POA: Diagnosis not present

## 2024-02-18 DIAGNOSIS — H35033 Hypertensive retinopathy, bilateral: Secondary | ICD-10-CM | POA: Diagnosis not present

## 2024-02-18 DIAGNOSIS — I130 Hypertensive heart and chronic kidney disease with heart failure and stage 1 through stage 4 chronic kidney disease, or unspecified chronic kidney disease: Secondary | ICD-10-CM | POA: Diagnosis not present

## 2024-02-18 DIAGNOSIS — Z7901 Long term (current) use of anticoagulants: Secondary | ICD-10-CM | POA: Diagnosis not present

## 2024-02-18 DIAGNOSIS — Z7984 Long term (current) use of oral hypoglycemic drugs: Secondary | ICD-10-CM | POA: Diagnosis not present

## 2024-02-18 DIAGNOSIS — R350 Frequency of micturition: Secondary | ICD-10-CM | POA: Diagnosis not present

## 2024-02-18 DIAGNOSIS — Z9181 History of falling: Secondary | ICD-10-CM | POA: Diagnosis not present

## 2024-02-18 DIAGNOSIS — E1122 Type 2 diabetes mellitus with diabetic chronic kidney disease: Secondary | ICD-10-CM | POA: Diagnosis not present

## 2024-02-18 NOTE — Telephone Encounter (Signed)
 Spoke to daughter and clarified the RX spending requirement for BMS. Patient has not spent anything yet towards OOP to qualify for BMS assistance. Advised patient to sign up for medicare payment plan and if that doesn't work, to follow up with clinic about changing drug therapies. Just noting as an FYI.

## 2024-02-20 ENCOUNTER — Telehealth: Payer: Self-pay | Admitting: Cardiology

## 2024-02-20 NOTE — Telephone Encounter (Signed)
 Not sure why it is denied - any reason? Or it is now expensive. Xarelto is the alternative - please look into coverage.   Ozan Maclay [place], DO, [facility]

## 2024-02-20 NOTE — Telephone Encounter (Signed)
 Pt came by to pick up samples, and stated that the insurance has denied her of eliquis and wants to know if there is anything else that can be prescribed to her.

## 2024-02-26 DIAGNOSIS — I252 Old myocardial infarction: Secondary | ICD-10-CM | POA: Diagnosis not present

## 2024-02-26 DIAGNOSIS — H35033 Hypertensive retinopathy, bilateral: Secondary | ICD-10-CM | POA: Diagnosis not present

## 2024-02-26 DIAGNOSIS — M545 Low back pain, unspecified: Secondary | ICD-10-CM | POA: Diagnosis not present

## 2024-02-26 DIAGNOSIS — I480 Paroxysmal atrial fibrillation: Secondary | ICD-10-CM | POA: Diagnosis not present

## 2024-02-26 DIAGNOSIS — E782 Mixed hyperlipidemia: Secondary | ICD-10-CM | POA: Diagnosis not present

## 2024-02-26 DIAGNOSIS — I6930 Unspecified sequelae of cerebral infarction: Secondary | ICD-10-CM | POA: Diagnosis not present

## 2024-02-26 DIAGNOSIS — Z7984 Long term (current) use of oral hypoglycemic drugs: Secondary | ICD-10-CM | POA: Diagnosis not present

## 2024-02-26 DIAGNOSIS — G894 Chronic pain syndrome: Secondary | ICD-10-CM | POA: Diagnosis not present

## 2024-02-26 DIAGNOSIS — M170 Bilateral primary osteoarthritis of knee: Secondary | ICD-10-CM | POA: Diagnosis not present

## 2024-02-26 DIAGNOSIS — N184 Chronic kidney disease, stage 4 (severe): Secondary | ICD-10-CM | POA: Diagnosis not present

## 2024-02-26 DIAGNOSIS — Z7901 Long term (current) use of anticoagulants: Secondary | ICD-10-CM | POA: Diagnosis not present

## 2024-02-26 DIAGNOSIS — E1122 Type 2 diabetes mellitus with diabetic chronic kidney disease: Secondary | ICD-10-CM | POA: Diagnosis not present

## 2024-02-26 DIAGNOSIS — I25118 Atherosclerotic heart disease of native coronary artery with other forms of angina pectoris: Secondary | ICD-10-CM | POA: Diagnosis not present

## 2024-02-26 DIAGNOSIS — D6869 Other thrombophilia: Secondary | ICD-10-CM | POA: Diagnosis not present

## 2024-02-26 DIAGNOSIS — M1A9XX0 Chronic gout, unspecified, without tophus (tophi): Secondary | ICD-10-CM | POA: Diagnosis not present

## 2024-02-26 DIAGNOSIS — Z556 Problems related to health literacy: Secondary | ICD-10-CM | POA: Diagnosis not present

## 2024-02-26 DIAGNOSIS — E559 Vitamin D deficiency, unspecified: Secondary | ICD-10-CM | POA: Diagnosis not present

## 2024-02-26 DIAGNOSIS — R350 Frequency of micturition: Secondary | ICD-10-CM | POA: Diagnosis not present

## 2024-02-26 DIAGNOSIS — I130 Hypertensive heart and chronic kidney disease with heart failure and stage 1 through stage 4 chronic kidney disease, or unspecified chronic kidney disease: Secondary | ICD-10-CM | POA: Diagnosis not present

## 2024-02-26 DIAGNOSIS — Z742 Need for assistance at home and no other household member able to render care: Secondary | ICD-10-CM | POA: Diagnosis not present

## 2024-02-26 DIAGNOSIS — Z9181 History of falling: Secondary | ICD-10-CM | POA: Diagnosis not present

## 2024-02-26 DIAGNOSIS — I5042 Chronic combined systolic (congestive) and diastolic (congestive) heart failure: Secondary | ICD-10-CM | POA: Diagnosis not present

## 2024-02-29 NOTE — Telephone Encounter (Signed)
 Unfortunately, her options are limited. If Eliquis and Xarelto are cost prohibitive than Coumadin is the alternative option.   Kristin [name] [place], DO, [facility]

## 2024-03-01 ENCOUNTER — Encounter: Payer: Self-pay | Admitting: Cardiology

## 2024-03-02 DIAGNOSIS — R350 Frequency of micturition: Secondary | ICD-10-CM | POA: Diagnosis not present

## 2024-03-02 DIAGNOSIS — E782 Mixed hyperlipidemia: Secondary | ICD-10-CM | POA: Diagnosis not present

## 2024-03-02 DIAGNOSIS — N184 Chronic kidney disease, stage 4 (severe): Secondary | ICD-10-CM | POA: Diagnosis not present

## 2024-03-02 DIAGNOSIS — I130 Hypertensive heart and chronic kidney disease with heart failure and stage 1 through stage 4 chronic kidney disease, or unspecified chronic kidney disease: Secondary | ICD-10-CM | POA: Diagnosis not present

## 2024-03-02 DIAGNOSIS — M170 Bilateral primary osteoarthritis of knee: Secondary | ICD-10-CM | POA: Diagnosis not present

## 2024-03-02 DIAGNOSIS — I5042 Chronic combined systolic (congestive) and diastolic (congestive) heart failure: Secondary | ICD-10-CM | POA: Diagnosis not present

## 2024-03-02 DIAGNOSIS — I480 Paroxysmal atrial fibrillation: Secondary | ICD-10-CM | POA: Diagnosis not present

## 2024-03-02 DIAGNOSIS — M545 Low back pain, unspecified: Secondary | ICD-10-CM | POA: Diagnosis not present

## 2024-03-02 DIAGNOSIS — D6869 Other thrombophilia: Secondary | ICD-10-CM | POA: Diagnosis not present

## 2024-03-02 DIAGNOSIS — G894 Chronic pain syndrome: Secondary | ICD-10-CM | POA: Diagnosis not present

## 2024-03-02 DIAGNOSIS — Z742 Need for assistance at home and no other household member able to render care: Secondary | ICD-10-CM | POA: Diagnosis not present

## 2024-03-02 DIAGNOSIS — Z556 Problems related to health literacy: Secondary | ICD-10-CM | POA: Diagnosis not present

## 2024-03-02 DIAGNOSIS — Z7984 Long term (current) use of oral hypoglycemic drugs: Secondary | ICD-10-CM | POA: Diagnosis not present

## 2024-03-02 DIAGNOSIS — I252 Old myocardial infarction: Secondary | ICD-10-CM | POA: Diagnosis not present

## 2024-03-02 DIAGNOSIS — I25118 Atherosclerotic heart disease of native coronary artery with other forms of angina pectoris: Secondary | ICD-10-CM | POA: Diagnosis not present

## 2024-03-02 DIAGNOSIS — E1122 Type 2 diabetes mellitus with diabetic chronic kidney disease: Secondary | ICD-10-CM | POA: Diagnosis not present

## 2024-03-02 DIAGNOSIS — Z9181 History of falling: Secondary | ICD-10-CM | POA: Diagnosis not present

## 2024-03-02 DIAGNOSIS — I6930 Unspecified sequelae of cerebral infarction: Secondary | ICD-10-CM | POA: Diagnosis not present

## 2024-03-02 DIAGNOSIS — H35033 Hypertensive retinopathy, bilateral: Secondary | ICD-10-CM | POA: Diagnosis not present

## 2024-03-02 DIAGNOSIS — Z7901 Long term (current) use of anticoagulants: Secondary | ICD-10-CM | POA: Diagnosis not present

## 2024-03-02 DIAGNOSIS — M1A9XX0 Chronic gout, unspecified, without tophus (tophi): Secondary | ICD-10-CM | POA: Diagnosis not present

## 2024-03-02 DIAGNOSIS — E559 Vitamin D deficiency, unspecified: Secondary | ICD-10-CM | POA: Diagnosis not present

## 2024-03-02 NOTE — Telephone Encounter (Signed)
 Plavix is not strong enough for thromboembolic prophylaxis given her A-fib  [place], DO, [facility]

## 2024-03-05 MED ORDER — APIXABAN 2.5 MG PO TABS: 2.5000 mg | ORAL_TABLET | Freq: Two times a day (BID) | ORAL | 0 refills | Status: DC

## 2024-03-05 NOTE — Telephone Encounter (Signed)
 Phone call from front desk stating pt's daughter at the front to pick up samples of Eliquis but there are not samples to be picked up.  Pt's daughter was unable to wait due to appointment.  Eliquis 2.5mg  #14 day samples provided and placed at the front desk for pick up.

## 2024-03-31 ENCOUNTER — Encounter: Payer: Self-pay | Admitting: Cardiology

## 2024-04-08 ENCOUNTER — Other Ambulatory Visit (INDEPENDENT_AMBULATORY_CARE_PROVIDER_SITE_OTHER): Payer: Self-pay | Admitting: Cardiology

## 2024-04-08 DIAGNOSIS — I2510 Atherosclerotic heart disease of native coronary artery without angina pectoris: Secondary | ICD-10-CM

## 2024-04-09 ENCOUNTER — Encounter: Payer: Self-pay | Admitting: Cardiology

## 2024-04-09 ENCOUNTER — Ambulatory Visit: Payer: Medicare HMO | Attending: Cardiology | Admitting: Cardiology

## 2024-04-09 VITALS — BP 142/72 | HR 71 | Ht 64.0 in | Wt 185.0 lb

## 2024-04-09 DIAGNOSIS — Z955 Presence of coronary angioplasty implant and graft: Secondary | ICD-10-CM

## 2024-04-09 DIAGNOSIS — I5042 Chronic combined systolic (congestive) and diastolic (congestive) heart failure: Secondary | ICD-10-CM | POA: Diagnosis not present

## 2024-04-09 DIAGNOSIS — N1832 Chronic kidney disease, stage 3b: Secondary | ICD-10-CM

## 2024-04-09 DIAGNOSIS — Z8673 Personal history of transient ischemic attack (TIA), and cerebral infarction without residual deficits: Secondary | ICD-10-CM | POA: Diagnosis not present

## 2024-04-09 DIAGNOSIS — E782 Mixed hyperlipidemia: Secondary | ICD-10-CM | POA: Diagnosis not present

## 2024-04-09 DIAGNOSIS — I480 Paroxysmal atrial fibrillation: Secondary | ICD-10-CM | POA: Diagnosis not present

## 2024-04-09 DIAGNOSIS — E1122 Type 2 diabetes mellitus with diabetic chronic kidney disease: Secondary | ICD-10-CM | POA: Diagnosis not present

## 2024-04-09 DIAGNOSIS — I2510 Atherosclerotic heart disease of native coronary artery without angina pectoris: Secondary | ICD-10-CM

## 2024-04-09 DIAGNOSIS — Z7901 Long term (current) use of anticoagulants: Secondary | ICD-10-CM

## 2024-04-09 MED ORDER — NITROGLYCERIN 0.4 MG SL SUBL: 0.4000 mg | SUBLINGUAL_TABLET | SUBLINGUAL | 0 refills | Status: AC | PRN

## 2024-04-09 NOTE — Progress Notes (Signed)
 Cardiology Office Note:  .   Date:  04/09/2024  ID:  Kristin [name], DOB 1941-05-07, MRN [id number] PCP:  Dorena Gander, MD  Former Cardiology Providers: NA Primary nephrologist: Dr. Rowan Cooter  [facility] Providers Cardiologist:  Olinda Bertrand, DO , [facility] (established care 04/04/2023) Electrophysiologist:  None  Click to update primary MD,subspecialty MD or APP then REFRESH:1}    Chief Complaint  Patient presents with   Follow-up    1 year - CHF, Afib, CAD    History of Present Illness: .   Kristin [name] is a 83 y.o. female whose past medical history and cardiovascular risk factors includes: Paroxysmal atrial fibrillation, NSTEMI (08/2020), multivessel CAD, recovered ischemic cardiomyopathy, chronic HFpEF/stage C/NYHA class II, hx of cerebellar stroke, lumbar stenosis status post laminectomy, hypertension, CKD stage III/IV, diabetes miellitus type 2, postmenopausal female, advanced age.    Patient presents to the office accompanied by her daughter Otelia Blew.  Patient is being followed by the practice given her underlying CAD status post coronary intervention, history of ACS, and atrial fibrillation.   Over the last 1 year patient denies anginal chest pain or heart failure symptoms. Patient denies any evidence of bleeding. She is compliant with her medical therapy. Overall functional capacity also remains relatively stable. She is requesting a refill on nitroglycerin  tablets.   Review of Systems: .   Review of Systems  Constitutional: Positive for weight loss (10# over the last year).  Cardiovascular:  Negative for chest pain, claudication, irregular heartbeat, leg swelling, near-syncope, orthopnea, palpitations, paroxysmal nocturnal dyspnea and syncope.  Respiratory:  Negative for shortness of breath.   Hematologic/Lymphatic: Negative for bleeding problem.    Studies Reviewed:   EKG: EKG Interpretation Date/Time:  Friday Apr 09 2024 08:12:13 EDT Ventricular  Rate:  71 PR Interval:  188 QRS Duration:  94 QT Interval:  384 QTC Calculation: 417 R Axis:   -13  Text Interpretation: Sinus rhythm with marked sinus arrhythmia Blocked PAC Moderate voltage criteria for LVH, may be normal variant ( R in aVL , Cornell product ) Cannot rule out Anterior infarct , age undetermined When compared with ECG of 02-Nov-2023 02:29,  No significant change since last tracing Confirmed by Olinda Bertrand [phone]) on 04/09/2024 8:32:49 AM  Echocardiogram: 12/15/2020 1. The distal inferoseptum and apex appear mildly hypokinetic, but WMA is much improved compared with prior study. LVEF has improved considerably. Left ventricular ejection fraction, by estimation, is 55 to 60%. Left ventricular ejection fraction by 3D volume is 56%. The left ventricle has normal function. The left ventricle demonstrates regional wall motion abnormalities (see scoring diagram/findings for description). Left ventricular diastolic parameters are consistent with Grade I diastolic dysfunction (impaired relaxation). 2. Right ventricular systolic function is normal. The right ventricular size is normal. There is normal pulmonary artery systolic pressure. The estimated right ventricular systolic pressure is 30.7 mmHg. 3. The mitral valve is grossly normal. Trivial mitral valve regurgitation. No evidence of mitral stenosis. 4. The aortic valve is tricuspid. There is mild calcification of the aortic valve. There is mild thickening of the aortic valve. Aortic valve regurgitation is not visualized. Mild aortic valve sclerosis is present, with no evidence of aortic valve stenosis. 5. The inferior vena cava is normal in size with greater than 50% respiratory variability, suggesting right atrial pressure of 3 mmHg.   07/06/2021:  Left ventricle cavity is normal in size. Mild concentric hypertrophy of the left ventricle. Mid to distal anteroseptal/anterior mild hypokinesis. Mild global hypokinesis. LVEF 40-45%.  Doppler evidence  of grade II (pseudonormal) diastolic dysfunction, elevated LAP.  Left atrial cavity is moderately dilated.  Moderate (Grade II) mitral regurgitation. Mildly restricted mitral valve leaflets.  Moderate tricuspid regurgitation. Moderate pulmonary hypertension.  Estimated pulmonary artery systolic pressure 59 mmHg.  Mild pulmonic regurgitation.  Previous study on 05/11/2021 noted akinetic mid-apical anterior wall, anteroseptal wall, and apex. LVEF 45-50%, no pulmonary hypertension.   Heart Catheterization: 05/11/2021 LM: Normal LAD: Prox 60% stenosis        Mid 95% stenosis just prox to large bifurcating diag        Diag ostium does not seem to be involved.        (Prior cath in 08/2020 noted prox 80% stenosis, followed by long mid to apical LAD occlusion, which seems to have recanalized) LCx: No significant disease RCA: Ostial 90% stenosis         (Significant dampening of pressure noted)         RPL 60% stenosis RCA and LCx system seems largely unchanged compared to 08/2020   Two vessel CAD (Prox-mid LAD and ostial RCA) Elevated LVEDP 29 mmHg   Given acute HFrEF, recommend staged revascularization. Options include CABG (Grafts to LAD, Diag, and RCA) vs PCI to LAD and RCA. Recommend medical management of heart failure over the weekend, followed by revascularization.   05/15/2021: Prox-mid LAD 60-99% stenosis   Successful percutaneous coronary intervention mid-prox LAD PTCA and stent placement 3.5 X 32 mm Synergy drug-eluting stent Proximal post dilatation using 4.0X15 mm balloon at 22 atm   99-->0% residual stenosis TIMI flow I-->III   Patient is also on eliquis  for Afib Recommend Aspirin , plavix , and eliquis  for 1 month  RADIOLOGY: NA  Risk Assessment/Calculations:   Click Here to Calculate/Change CHADS2VASc Score The patient's CHADS2-VASc score is 9, indicating a 12.2% annual risk of stroke.     Labs:       Latest Ref Rng & Units 11/03/2023    2:58 AM  11/02/2023    8:06 AM 11/02/2023    2:28 AM  CBC  WBC 4.0 - 10.5 K/uL 4.5  5.8  7.7   Hemoglobin 12.0 - 15.0 g/dL 30.8  65.7  84.6   Hematocrit 36.0 - 46.0 % 34.5  36.4  35.9   Platelets 150 - 400 K/uL 210  236  220        Latest Ref Rng & Units 11/03/2023    2:58 AM 11/02/2023    2:28 AM 09/02/2023    3:38 PM  BMP  Glucose 70 - 99 mg/dL 98  962  952   BUN 8 - 23 mg/dL 24  20  37   Creatinine 0.44 - 1.00 mg/dL 8.41  3.24  4.01   Sodium 135 - 145 mmol/L 137  136  138   Potassium 3.5 - 5.1 mmol/L 4.6  3.9  4.1   Chloride 98 - 111 mmol/L 103  102  101   CO2 22 - 32 mmol/L 30  24    Calcium  8.9 - 10.3 mg/dL 9.0  9.2        Latest Ref Rng & Units 11/03/2023    2:58 AM 11/02/2023    2:28 AM 09/02/2023    3:38 PM  CMP  Glucose 70 - 99 mg/dL 98  027  253   BUN 8 - 23 mg/dL 24  20  37   Creatinine 0.44 - 1.00 mg/dL 6.64  4.03  4.74   Sodium 135 - 145 mmol/L 137  136  138  Potassium 3.5 - 5.1 mmol/L 4.6  3.9  4.1   Chloride 98 - 111 mmol/L 103  102  101   CO2 22 - 32 mmol/L 30  24    Calcium  8.9 - 10.3 mg/dL 9.0  9.2    Total Protein 6.5 - 8.1 g/dL 6.1     Total Bilirubin <1.2 mg/dL 0.4     Alkaline Phos 38 - 126 U/L 92     AST 15 - 41 U/L 40     ALT 0 - 44 U/L 35       Lab Results  Component Value Date   CHOL 124 03/26/2023   HDL 53 03/26/2023   LDLCALC 56 03/26/2023   LDLDIRECT 51 03/26/2023   TRIG 72 03/26/2023   CHOLHDL 2.3 03/26/2023   No results for input(s): "LIPOA" in the last 8760 hours. No components found for: "NTPROBNP" No results for input(s): "PROBNP" in the last 8760 hours. No results for input(s): "TSH" in the last 8760 hours.  External Labs: Collected: February 21st 2025 [facility] database. Total cholesterol 125, triglycerides 72, HDL 49, LDL 62. Hemoglobin A1c 5.7  Physical Exam:    Today's Vitals   04/09/24 0807  BP: (!) 142/72  Pulse: 71  SpO2: 98%  Weight: 185 lb (83.9 kg)  Height: 5\' 4"  (1.626 m)   Body mass index is 31.76 kg/m. Wt  Readings from Last 3 Encounters:  04/09/24 185 lb (83.9 kg)  11/03/23 180 lb 12.8 oz (82 kg)  10/10/23 181 lb 12.8 oz (82.5 kg)    Physical Exam  Constitutional: No distress.  hemodynamically stable  Neck: No JVD present.  Cardiovascular: Normal rate, regular rhythm, S1 normal and S2 normal. Exam reveals no gallop, no S3 and no S4.  No murmur heard. Pulmonary/Chest: Effort normal and breath sounds normal. No stridor. She has no wheezes. She has no rales.  Musculoskeletal:        General: No edema.     Cervical back: Neck supple.  Skin: Skin is warm.     Impression & Recommendation(s):  Impression:   ICD-10-CM   1. Atherosclerosis of native coronary artery of native heart without angina pectoris  I25.10 ECHOCARDIOGRAM COMPLETE    nitroGLYCERIN  (NITROSTAT ) 0.4 MG SL tablet    2. Status post coronary artery stent placement  Z95.5     3. History of coronary angioplasty with insertion of stent  Z95.5 EKG 12-Lead    4. Chronic combined systolic and diastolic congestive heart failure (HCC)  I50.42 EKG 12-Lead    ECHOCARDIOGRAM COMPLETE    5. Paroxysmal atrial fibrillation (HCC)  I48.0 EKG 12-Lead    Hemoglobin and hematocrit, blood    Basic metabolic panel with GFR    Basic metabolic panel with GFR    Hemoglobin and hematocrit, blood    6. Long term (current) use of anticoagulants  Z79.01 Hemoglobin and hematocrit, blood    Basic metabolic panel with GFR    Basic metabolic panel with GFR    Hemoglobin and hematocrit, blood    7. Mixed hyperlipidemia  E78.2     8. Hx of stroke  Z86.73     9. Type 2 diabetes mellitus with stage 3b chronic kidney disease, without long-term current use of insulin (HCC)  E11.22    N18.32        Recommendation(s):  Atherosclerosis of native coronary artery of native heart without angina pectoris History of coronary angioplasty with insertion of stent Underwent PCI to the proximal/mid LAD back in  June 2022. Denies anginal chest pain. EKG is  nonischemic. Outside labs from [facility] database from February 2025 reviewed, LDL is 62 mg/dL. Reemphasized importance of glycemic control. Home blood pressures are better controlled. Medication intolerance: Metoprolol  succinate discontinued due to chest pain, per patient Refill sublingual nitroglycerin  tablets. Patient has lost 10 pounds over the last 1 year, congratulated on her efforts. Echo will be ordered to evaluate for structural heart disease and left ventricular systolic function.  Prior to the next office visit.  Chronic combined systolic and diastolic congestive heart failure (HCC) Euvolemic. Stage C, NYHA class II. Continue Farxiga  10 mg p.o. daily. Continue hydralazine  100 mg p.o. 3 times daily. Continue Imdur  120 mg p.o. daily. Continue Entresto  49/51 mg p.o. twice daily  Paroxysmal atrial fibrillation (HCC) Rate control: Carvedilol . Rhythm control: N/A. Thromboembolic prophylaxis: Eliquis  Currently on Eliquis  2.5 mg p.o. twice daily. Patient is aged 47, weight 83.9 kg, and serum creatinine as of February 2025 1.48. Ideally should be on Eliquis  5 mg p.o. twice daily. However her serum creatinine is a difference of 0.02 mg/dL (from the cutoff value of 1.5 mg/dL).  Shared decision was to continue Eliquis  at 2.5 mg p.o. twice daily.  I have asked her to call us  back if serum creatinine continues to improve on follow-up labs -this will necessitate the change in Eliquis  dosing We also discussed transitioning to Xarelto, but Xarelto is cost prohibitive per patient's daughter  Long term (current) use of anticoagulants Indication: Paroxysmal atrial fibrillation. Does not endorse evidence of bleeding. Most recent hemoglobin is 11.7 g/dL as of February 1610. Will order a BMP and H&H in 6 months.  Mixed hyperlipidemia Currently on Crestor  40 mg p.o. daily and Zetia  10 mg p.o. daily. LDL is 62 mg/dL. In the past patient did not want to be on injectable medications like PCSK9  inhibitors. Reemphasized importance of lifestyle modifications, she has lost 10 pounds since last office visit for which she is congratulated for.  Type 2 diabetes mellitus with stage 3b chronic kidney disease, without long-term current use of insulin (HCC) Reemphasized importance of glycemic control. Most recent hemoglobin A1c 5.7 as of February 2025. Cardiology following peripherally.   Orders Placed:  Orders Placed This Encounter  Procedures   Hemoglobin and hematocrit, blood    Standing Status:   Future    Number of Occurrences:   1    Expected Date:   10/10/2024    Expiration Date:   04/09/2025   Basic metabolic panel with GFR    Standing Status:   Future    Number of Occurrences:   1    Expected Date:   10/10/2024    Expiration Date:   04/09/2025   EKG 12-Lead   ECHOCARDIOGRAM COMPLETE    Standing Status:   Future    Expected Date:   04/09/2025    Expiration Date:   04/09/2025    Where should this test be performed[facility]    Does the patient weigh less than or greater than 250 lbs?:   Patient weighs less than 250 lbs    Perflutren  DEFINITY  (image enhancing agent) should be administered unless hypersensitivity or allergy exist:   Administer Perflutren     Reason for exam-Echo:   Other-Full Diagnosis List    Full ICD-10/Reason for Exam:   CAD (coronary artery disease) [960454]    Full ICD-10/Reason for Exam:   Combined systolic and diastolic heart failure (HCC) [098119]     Final Medication List:  Meds ordered this encounter  Medications   nitroGLYCERIN  (NITROSTAT ) 0.4 MG SL tablet    Sig: Place 1 tablet (0.4 mg total) under the tongue every 5 (five) minutes as needed for chest pain.    Dispense:  30 tablet    Refill:  0    Medications Discontinued During This Encounter  Medication Reason   cholecalciferol  (VITAMIN D ) 25 MCG (1000 UNIT) tablet Patient Preference   colchicine  0.6 MG tablet Patient Preference   apixaban  (ELIQUIS ) 2.5 MG TABS tablet Duplicate    nitroGLYCERIN  (NITROSTAT ) 0.4 MG SL tablet Reorder     Current Outpatient Medications:    apixaban  (ELIQUIS ) 2.5 MG TABS tablet, Take 1 tablet (2.5 mg total) by mouth 2 (two) times daily., Disp: 180 tablet, Rfl: 0   carvedilol  (COREG ) 12.5 MG tablet, Take 1 tablet (12.5 mg total) by mouth 2 (two) times daily with a meal., Disp: 90 tablet, Rfl: 3   dapagliflozin  propanediol (FARXIGA ) 10 MG TABS tablet, Take 1 tablet (10 mg total) by mouth daily before breakfast., Disp: 90 tablet, Rfl: 1   diclofenac  Sodium (VOLTAREN ) 1 % GEL, Apply 2 g topically 4 (four) times daily., Disp: , Rfl:    ezetimibe  (ZETIA ) 10 MG tablet, Take 1 tablet by mouth once daily, Disp: 90 tablet, Rfl: 1   hydrALAZINE  (APRESOLINE ) 100 MG tablet, Take 1 tablet (100 mg total) by mouth 3 (three) times daily., Disp: 180 tablet, Rfl: 3   HYDROcodone -acetaminophen  (NORCO) 5-325 MG tablet, Take 1 tablet by mouth every 4 (four) hours as needed for moderate pain., Disp: 15 tablet, Rfl: 0   isosorbide  mononitrate (IMDUR ) 120 MG 24 hr tablet, Take 1 tablet by mouth once daily, Disp: 90 tablet, Rfl: 3   pantoprazole  (PROTONIX ) 40 MG tablet, Take 1 tablet (40 mg total) by mouth daily., Disp: 30 tablet, Rfl: 0   rosuvastatin  (CRESTOR ) 40 MG tablet, Take 1 tablet (40 mg total) by mouth at bedtime., Disp: 90 tablet, Rfl: 0   sacubitril -valsartan  (ENTRESTO ) 49-51 MG, Take 1 tablet by mouth twice daily, Disp: 180 tablet, Rfl: 2   cyclobenzaprine  (FLEXERIL ) 10 MG tablet, Take 0.5 tablets (5 mg total) by mouth 2 (two) times daily as needed for muscle spasms. (Patient not taking: Reported on 04/09/2024), Disp: 10 tablet, Rfl: 0   nitroGLYCERIN  (NITROSTAT ) 0.4 MG SL tablet, Place 1 tablet (0.4 mg total) under the tongue every 5 (five) minutes as needed for chest pain., Disp: 30 tablet, Rfl: 0  Consent:   NA  Disposition:   1 year follow-up sooner if needed  Her questions and concerns were addressed to her satisfaction. She voices understanding  of the recommendations provided during this encounter.    Signed, Olinda Bertrand, DO, [facility]  [facility]  04/09/2024 11:22 AM

## 2024-04-09 NOTE — Patient Instructions (Signed)
 Medication Instructions:  Your physician has recommended you make the following change in your medication:  Refill for nitroglycerin  has been sent to your pharmacy.   *If you need a refill on your cardiac medications before your next appointment, please call your pharmacy*  Lab Work: To be completed in 6 months: BMP and hemoglobin/hematocrit  If you have labs (blood work) drawn today and your tests are completely normal, you will receive your results only by: MyChart Message (if you have MyChart) OR A paper copy in the mail If you have any lab test that is abnormal or we need to change your treatment, we will call you to review the results.  Testing/Procedures: Your physician has requested that you have an echocardiogram in 1 year prior to follow-up with Dr. Albert Huff. Echocardiography is a painless test that uses sound waves to create images of your heart. It provides your doctor with information about the size and shape of your heart and how well your heart's chambers and valves are working. This procedure takes approximately one hour. There are no restrictions for this procedure. Please do NOT wear cologne, perfume, aftershave, or lotions (deodorant is allowed). Please arrive 15 minutes prior to your appointment time.  Please note: We ask at that you not bring children with you during ultrasound (echo/ vascular) testing. Due to room size and safety concerns, children are not allowed in the ultrasound rooms during exams. Our front office staff cannot provide observation of children in our lobby area while testing is being conducted. An adult accompanying a patient to their appointment will only be allowed in the ultrasound room at the discretion of the ultrasound technician under special circumstances. We apologize for any inconvenience.   Follow-Up: At [facility], you and your health needs are our priority.  As part of our continuing mission to provide you with exceptional heart care, we  have created designated Provider Care Teams.  These Care Teams include your primary Cardiologist (physician) and Advanced Practice Providers (APPs -  Physician Assistants and Nurse Practitioners) who all work together to provide you with the care you need, when you need it.  We recommend signing up for the patient portal called "MyChart".  Sign up information is provided on this After Visit Summary.  MyChart is used to connect with patients for Virtual Visits (Telemedicine).  Patients are able to view lab/test results, encounter notes, upcoming appointments, etc.  Non-urgent messages can be sent to your provider as well.   To learn more about what you can do with MyChart, go to [email or web address].    Your next appointment:   1 year(s)  The format for your next appointment:   In Person  Provider:   Olinda Bertrand, [facility]

## 2024-05-24 ENCOUNTER — Other Ambulatory Visit: Payer: Self-pay | Admitting: Cardiology

## 2024-05-24 DIAGNOSIS — I480 Paroxysmal atrial fibrillation: Secondary | ICD-10-CM

## 2024-05-24 DIAGNOSIS — Z7901 Long term (current) use of anticoagulants: Secondary | ICD-10-CM

## 2024-05-24 NOTE — Telephone Encounter (Signed)
 Prescription refill request for Eliquis  received. Indication:afib Last office visit:5/25 Scr:1.46  11/24 Age: 83 Weight:83.9  kg  Prescription refilled

## 2024-06-15 DIAGNOSIS — H02412 Mechanical ptosis of left eyelid: Secondary | ICD-10-CM | POA: Diagnosis not present

## 2024-07-02 DIAGNOSIS — I129 Hypertensive chronic kidney disease with stage 1 through stage 4 chronic kidney disease, or unspecified chronic kidney disease: Secondary | ICD-10-CM | POA: Diagnosis not present

## 2024-07-02 DIAGNOSIS — I639 Cerebral infarction, unspecified: Secondary | ICD-10-CM | POA: Diagnosis not present

## 2024-07-02 DIAGNOSIS — N1832 Chronic kidney disease, stage 3b: Secondary | ICD-10-CM | POA: Diagnosis not present

## 2024-07-02 DIAGNOSIS — E1122 Type 2 diabetes mellitus with diabetic chronic kidney disease: Secondary | ICD-10-CM | POA: Diagnosis not present

## 2024-07-02 DIAGNOSIS — I5020 Unspecified systolic (congestive) heart failure: Secondary | ICD-10-CM | POA: Diagnosis not present

## 2024-07-19 DIAGNOSIS — M1711 Unilateral primary osteoarthritis, right knee: Secondary | ICD-10-CM | POA: Diagnosis not present

## 2024-07-19 DIAGNOSIS — I10 Essential (primary) hypertension: Secondary | ICD-10-CM | POA: Diagnosis not present

## 2024-07-19 DIAGNOSIS — G894 Chronic pain syndrome: Secondary | ICD-10-CM | POA: Diagnosis not present

## 2024-07-19 DIAGNOSIS — M545 Low back pain, unspecified: Secondary | ICD-10-CM | POA: Diagnosis not present

## 2024-07-19 DIAGNOSIS — M542 Cervicalgia: Secondary | ICD-10-CM | POA: Diagnosis not present

## 2024-07-28 ENCOUNTER — Other Ambulatory Visit: Payer: Self-pay | Admitting: Cardiology

## 2024-07-30 ENCOUNTER — Emergency Department (HOSPITAL_COMMUNITY)
Admission: EM | Admit: 2024-07-30 | Discharge: 2024-07-30 | Disposition: A | Discharge status: Home / Self Care | Attending: Emergency Medicine | Admitting: Emergency Medicine

## 2024-07-30 ENCOUNTER — Emergency Department (HOSPITAL_COMMUNITY)

## 2024-07-30 ENCOUNTER — Encounter (HOSPITAL_COMMUNITY): Payer: Self-pay | Admitting: *Deleted

## 2024-07-30 ENCOUNTER — Other Ambulatory Visit: Payer: Self-pay

## 2024-07-30 DIAGNOSIS — M545 Low back pain, unspecified: Secondary | ICD-10-CM | POA: Diagnosis present

## 2024-07-30 DIAGNOSIS — Z8673 Personal history of transient ischemic attack (TIA), and cerebral infarction without residual deficits: Secondary | ICD-10-CM | POA: Diagnosis not present

## 2024-07-30 DIAGNOSIS — Z7901 Long term (current) use of anticoagulants: Secondary | ICD-10-CM | POA: Diagnosis not present

## 2024-07-30 DIAGNOSIS — E1122 Type 2 diabetes mellitus with diabetic chronic kidney disease: Secondary | ICD-10-CM | POA: Diagnosis not present

## 2024-07-30 DIAGNOSIS — M5126 Other intervertebral disc displacement, lumbar region: Secondary | ICD-10-CM | POA: Diagnosis not present

## 2024-07-30 DIAGNOSIS — N184 Chronic kidney disease, stage 4 (severe): Secondary | ICD-10-CM | POA: Insufficient documentation

## 2024-07-30 DIAGNOSIS — I5089 Other heart failure: Secondary | ICD-10-CM | POA: Diagnosis not present

## 2024-07-30 DIAGNOSIS — M48061 Spinal stenosis, lumbar region without neurogenic claudication: Secondary | ICD-10-CM | POA: Diagnosis not present

## 2024-07-30 DIAGNOSIS — M47816 Spondylosis without myelopathy or radiculopathy, lumbar region: Secondary | ICD-10-CM | POA: Diagnosis not present

## 2024-07-30 DIAGNOSIS — M4807 Spinal stenosis, lumbosacral region: Secondary | ICD-10-CM | POA: Diagnosis not present

## 2024-07-30 DIAGNOSIS — M5459 Other low back pain: Secondary | ICD-10-CM | POA: Diagnosis not present

## 2024-07-30 LAB — URINALYSIS, ROUTINE W REFLEX MICROSCOPIC
Bilirubin Urine: NEGATIVE
Glucose, UA: >=500 mg/dL — AB
Hgb urine dipstick: NEGATIVE
Ketones, ur: NEGATIVE mg/dL
Leukocytes,Ua: NEGATIVE
Nitrite: NEGATIVE
Protein, ur: NEGATIVE mg/dL
Specific Gravity, Urine: 1.005 (ref 1.005–1.030)
pH: 7 (ref 5.0–8.0)

## 2024-07-30 LAB — COMPREHENSIVE METABOLIC PANEL WITH GFR
ALT: 9 U/L (ref 0–44)
AST: 20 U/L (ref 15–41)
Albumin: 3.8 g/dL (ref 3.5–5.0)
Alkaline Phosphatase: 54 U/L (ref 38–126)
Anion gap: 13 (ref 5–15)
BUN: 19 mg/dL (ref 8–23)
CO2: 22 mmol/L (ref 22–32)
Calcium: 10 mg/dL (ref 8.9–10.3)
Chloride: 112 mmol/L — ABNORMAL HIGH (ref 98–111)
Creatinine, Ser: 1.42 mg/dL — ABNORMAL HIGH (ref 0.44–1.00)
GFR, Estimated: 36 mL/min — ABNORMAL LOW (ref >60)
Glucose, Bld: 102 mg/dL — ABNORMAL HIGH (ref 70–99)
Potassium: 4.7 mmol/L (ref 3.5–5.1)
Sodium: 147 mmol/L — ABNORMAL HIGH (ref 135–145)
Total Bilirubin: 0.8 mg/dL (ref 0.0–1.2)
Total Protein: 7.2 g/dL (ref 6.5–8.1)

## 2024-07-30 LAB — CBC WITH DIFFERENTIAL/PLATELET
Abs Immature Granulocytes: 0.01 K/uL (ref 0.00–0.07)
Basophils Absolute: 0 K/uL (ref 0.0–0.1)
Basophils Relative: 1 %
Eosinophils Absolute: 0.1 K/uL (ref 0.0–0.5)
Eosinophils Relative: 2 %
HCT: 41.2 % (ref 36.0–46.0)
Hemoglobin: 13 g/dL (ref 12.0–15.0)
Immature Granulocytes: 0 %
Lymphocytes Relative: 34 %
Lymphs Abs: 1.8 K/uL (ref 0.7–4.0)
MCH: 31.1 pg (ref 26.0–34.0)
MCHC: 31.6 g/dL (ref 30.0–36.0)
MCV: 98.6 fL (ref 80.0–100.0)
Monocytes Absolute: 0.9 K/uL (ref 0.1–1.0)
Monocytes Relative: 18 %
Neutro Abs: 2.4 K/uL (ref 1.7–7.7)
Neutrophils Relative %: 45 %
Platelets: 163 K/uL (ref 150–400)
RBC: 4.18 MIL/uL (ref 3.87–5.11)
RDW: 13.3 % (ref 11.5–15.5)
WBC: 5.3 K/uL (ref 4.0–10.5)
nRBC: 0 % (ref 0.0–0.2)

## 2024-07-30 MED ORDER — OXYCODONE HCL 5 MG PO TABS: 5.0000 mg | ORAL_TABLET | Freq: Four times a day (QID) | ORAL | 0 refills | Status: AC | PRN

## 2024-07-30 MED ORDER — ACETAMINOPHEN 325 MG PO TABS
650.0000 mg | ORAL_TABLET | Freq: Once | ORAL | Status: AC
  Administered 2024-07-30: 650 mg via ORAL
  Filled 2024-07-30: qty 2

## 2024-07-30 MED ORDER — MORPHINE SULFATE (PF) 4 MG/ML IV SOLN
4.0000 mg | Freq: Once | INTRAVENOUS | Status: AC
  Administered 2024-07-30: 4 mg via INTRAVENOUS
  Filled 2024-07-30: qty 1

## 2024-07-30 MED ORDER — OXYCODONE HCL 5 MG PO TABS
5.0000 mg | ORAL_TABLET | Freq: Once | ORAL | Status: AC
  Administered 2024-07-30: 5 mg via ORAL
  Filled 2024-07-30: qty 1

## 2024-07-30 MED ORDER — PREDNISONE 20 MG PO TABS
40.0000 mg | ORAL_TABLET | Freq: Once | ORAL | Status: AC
  Administered 2024-07-30: 40 mg via ORAL
  Filled 2024-07-30: qty 2

## 2024-07-30 MED ORDER — LIDOCAINE 5 % EX PTCH
1.0000 | MEDICATED_PATCH | CUTANEOUS | Status: DC
  Administered 2024-07-30: 1 via TRANSDERMAL
  Filled 2024-07-30: qty 1

## 2024-07-30 NOTE — ED Notes (Signed)
 Patient transported to MRI

## 2024-07-30 NOTE — Discharge Instructions (Signed)
 Thank you for allowing us  to care for you today.  Your MRI showed no evidence of acute injury of your low back.  We are giving you a prescription for oxycodone  to be taken for severe pain.  This medication can cause constipation.  Please do not take this medication and drive or perform dangerous activities.  Please be sure to follow-up with your primary care provider.  Please return to the emergency department if you experience worsening pain, numbness or tingling in your lower extremities, have bowel or bladder incontinence, develop chest pain or shortness of breath or believe need emergent medical care.  We hope you feel better soon

## 2024-07-30 NOTE — ED Triage Notes (Signed)
 C/o lower back pain onset 3 days go , states with certain movement she will be incont of urine

## 2024-07-30 NOTE — ED Provider Notes (Signed)
 [place] [facility] Provider Note   CSN: [id number] Arrival date & time: 07/30/24  1216     Patient presents with: Back Pain   Kristin [name] is a 83 y.o. female past medical history significant for paroxysmal atrial fibrillation on Eliquis , CAD with recovered ischemic cardiomyopathy, HFpEF, history of CVA, lumbar stenosis status post pneumonectomy, CKD stage IV, type 2 diabetes who presents emergency department for lower back pain.  Patient states that she has a history of back surgeries most recently in 2021 and follows up in outpatient setting regularly with steroid injections.  Patient states that approximately 3 days ago she began experiencing acute lower back pain with radiation into her left hip that is different from her prior back pain.  Patient denies any history of trauma or falls.  Patient states that she has had associated urinary continence and difficulty with ambulation over the past 3 days.  Patient denies associated fever, bowel incontinence, syncope, chest pain, shortness of breath, abdominal pain, nausea, vomiting.    Back Pain      Prior to Admission medications   Medication Sig Start Date End Date Taking? Authorizing Provider  acetaminophen  (TYLENOL ) 500 MG tablet Take 1,000 mg by mouth in the morning, at noon, and at bedtime.   Yes [provider]  apixaban  (ELIQUIS ) 2.5 MG TABS tablet Take 1 tablet by mouth twice daily 05/24/24  Yes Tolia, Sunit, DO  carvedilol  (COREG ) 12.5 MG tablet TAKE 1 TABLET BY MOUTH TWICE DAILY WITH A MEAL 07/28/24  Yes Tolia, Sunit, DO  cyclobenzaprine  (FLEXERIL ) 10 MG tablet Take 0.5 tablets (5 mg total) by mouth 2 (two) times daily as needed for muscle spasms. 09/02/23  Yes Odell Balls, PA-C  dapagliflozin  propanediol (FARXIGA ) 10 MG TABS tablet Take 1 tablet (10 mg total) by mouth daily before breakfast. 10/10/23  Yes Dick, Ernest H Jr., NP  diclofenac  Sodium (VOLTAREN ) 1 % GEL Apply 2 g  topically 4 (four) times daily. 08/30/20  Yes Cheryle Page, MD  ezetimibe  (ZETIA ) 10 MG tablet Take 1 tablet by mouth once daily Patient taking differently: Take 10 mg by mouth in the morning. 10/06/23  Yes Tolia, Sunit, DO  hydrALAZINE  (APRESOLINE ) 100 MG tablet Take 1 tablet (100 mg total) by mouth 3 (three) times daily. 10/10/23  Yes Wyn Jackee VEAR Mickey., NP  isosorbide  mononitrate (IMDUR ) 120 MG 24 hr tablet Take 1 tablet by mouth once daily Patient taking differently: Take 120 mg by mouth in the morning. 01/06/24  Yes Wyn Jackee VEAR Mickey., NP  Multiple Vitamin (MULTIVITAMIN WITH MINERALS) TABS tablet Take 1 tablet by mouth in the morning.   Yes [provider]  nitroGLYCERIN  (NITROSTAT ) 0.4 MG SL tablet Place 1 tablet (0.4 mg total) under the tongue every 5 (five) minutes as needed for chest pain. 04/09/24  Yes Tolia, Sunit, DO  oxyCODONE  (ROXICODONE ) 5 MG immediate release tablet Take 1 tablet (5 mg total) by mouth every 6 (six) hours as needed for up to 5 days. 07/30/24 08/04/24 Yes Nada Chroman, DO  pantoprazole  (PROTONIX ) 40 MG tablet Take 1 tablet (40 mg total) by mouth daily. Patient taking differently: Take 40 mg by mouth in the morning. 08/30/20  Yes Cheryle Page, MD  rosuvastatin  (CRESTOR ) 40 MG tablet Take 1 tablet (40 mg total) by mouth at bedtime. Patient taking differently: Take 40 mg by mouth in the morning. 10/10/23  Yes Wyn Jackee VEAR Mickey., NP  sacubitril -valsartan  (ENTRESTO ) 49-51 MG Take 1 tablet by mouth  twice daily 01/27/24  Yes Wyn Jackee VEAR Mickey., NP    Allergies: Pork-derived products and Tramadol    Review of Systems  Musculoskeletal:  Positive for back pain.    Updated Vital Signs BP (!) 158/91   Pulse 77   Temp 97.9 F (36.6 C) (Oral)   Resp 16   Ht 5' 4 (1.626 m)   Wt 83 kg   SpO2 100%   BMI 31.41 kg/m   Physical Exam Vitals reviewed.  Constitutional:      General: She is in acute distress.     Comments: Acute distress secondary to lower back pain   HENT:     Head: Normocephalic and atraumatic.  Eyes:     Pupils: Pupils are equal, round, and reactive to light.  Neck:     Comments: Chronic midline cervical spine tenderness without acute pain Cardiovascular:     Rate and Rhythm: Normal rate.     Pulses:          Dorsalis pedis pulses are 2+ on the right side and 2+ on the left side.  Pulmonary:     Effort: Pulmonary effort is normal. No tachypnea.  Abdominal:     General: There is no distension.     Palpations: Abdomen is soft.     Tenderness: There is no abdominal tenderness.  Genitourinary:    Rectum: Normal anal tone.  Musculoskeletal:     Cervical back: Spinous process tenderness present. No muscular tenderness.     Right lower leg: No edema.     Left lower leg: No edema.     Comments: Spontaneous movement of bilateral upper and lower extremities.  Midline lower lumbar spinal tenderness without obvious step-off or deformity  Neurological:     Mental Status: She is alert.     Sensory: Sensation is intact. No sensory deficit.     Motor: Motor function is intact. No weakness, tremor, atrophy, seizure activity or pronator drift.     Comments: 5/5 strength of bilateral lower extremities with sensation intact.  No evidence of pronator drift.  2+ DP pulses bilaterally  5 out of 5 strength of upper extremities with sensation intact.  No evidence of pronator drift.  2+ radial pulses bilaterally  Psychiatric:        Behavior: Behavior is cooperative.     (all labs ordered are listed, but only abnormal results are displayed) Labs Reviewed  COMPREHENSIVE METABOLIC PANEL WITH GFR - Abnormal; Notable for the following components:      Result Value   Sodium 147 (*)    Chloride 112 (*)    Glucose, Bld 102 (*)    Creatinine, Ser 1.42 (*)    GFR, Estimated 36 (*)    All other components within normal limits  URINALYSIS, ROUTINE W REFLEX MICROSCOPIC - Abnormal; Notable for the following components:   Color, Urine STRAW (*)     Glucose, UA >=500 (*)    Bacteria, UA RARE (*)    All other components within normal limits  URINE CULTURE  CBC WITH DIFFERENTIAL/PLATELET  CBC WITH DIFFERENTIAL/PLATELET    EKG: None  Radiology: MR LUMBAR SPINE WO CONTRAST Result Date: 07/30/2024 CLINICAL DATA:  midline tenderness, urinary incontinence EXAM: MRI LUMBAR SPINE WITHOUT CONTRAST TECHNIQUE: Multiplanar, multisequence MR imaging of the lumbar spine was performed. No intravenous contrast was administered. COMPARISON:  MRI lumbar spine 08/18/2020. FINDINGS: Segmentation:  Standard. Alignment:  Grade 1 anterolisthesis of L2 on L3 and L3 on L4. Vertebrae:  No fracture,  evidence of discitis, or bone lesion. Conus medullaris and cauda equina: Conus extends to the L1-L2 level. Conus and cauda equina appear normal. Paraspinal and other soft tissues: Right renal cyst. Ill-defined posterior paraspinal edema. Approximately 2.7 cm left adnexal cyst. No follow-up imaging recommended. Note: This recommendation does not apply to premenarchal patients and to those with increased risk (genetic, family history, elevated tumor markers or other high-risk factors) of ovarian cancer. Reference: JACR 2020 Feb; 17(2):248-254 Disc levels: T12-L1: No significant disc protrusion, foraminal stenosis, or canal stenosis. L1-L2: Mild disc bulging and facet arthropathy without significant stenosis. L2-L3: Posterior decompression. Grade 1 anterolisthesis. Disc bulging and severe facet arthropathy. Resulting moderate right and mild left foraminal stenosis. Patent canal. L3-L4: Posterior decompression. Disc bulging and bilateral facet arthropathy. Resulting moderate left and mild right foraminal stenosis. Patent canal. L4-L5: Disc bulging and endplate spurring. Bilateral facet arthropathy. Mild left foraminal stenosis. Patent canal and right foramen. L5-S1: Mild disc bulging and superimposed left foraminal disc protrusion. Resulting moderate left foraminal stenosis with far  lateral disc closely approximating the exiting/exited left L5 nerve. Patent canal. IMPRESSION: 1. At L2-L3, moderate right and mild left foraminal stenosis. 2. At L3-L4, moderate left and mild right foraminal stenosis. 3. At L4-L5, mild left foraminal stenosis. 4. At L5-S1, moderate left foraminal stenosis with far lateral disc closely approximating the exiting/exited left L5 nerve. 5. No significant canal stenosis. Electronically Signed   By: Gilmore GORMAN Molt M.D.   On: 07/30/2024 22:37     Procedures   Medications Ordered in the ED  lidocaine  (LIDODERM ) 5 % 1 patch (1 patch Transdermal Patch Applied 07/30/24 2253)  morphine  (PF) 4 MG/ML injection 4 mg (4 mg Intravenous Given 07/30/24 1546)  acetaminophen  (TYLENOL ) tablet 650 mg (650 mg Oral Given 07/30/24 1541)  acetaminophen  (TYLENOL ) tablet 650 mg (650 mg Oral Given 07/30/24 2252)  oxyCODONE  (Oxy IR/ROXICODONE ) immediate release tablet 5 mg (5 mg Oral Given 07/30/24 2252)  predniSONE  (DELTASONE ) tablet 40 mg (40 mg Oral Given 07/30/24 2252)    Clinical Course as of 07/30/24 2335  Fri Jul 30, 2024  1626 Comprehensive metabolic panel(!) Baseline creatinine [AG]  1720 Post void residual 0cc [AG]    Clinical Course User Index [AG] Nada Chroman, DO                                 Medical Decision Making Amount and/or Complexity of Data Reviewed Labs: ordered. Decision-making details documented in ED Course. Radiology: ordered.  Risk OTC drugs. Prescription drug management.   On initial evaluation patient is hemodynamically stable, afebrile and is in acute distress secondary to lower back pain.  Based on patient's history and physical examination findings differential diagnosis includes acute fracture, acute dislocation, ligamentous injury, epidural abscess, chronic low back pain, arthritis, cauda equina.  Based on patient's history of urinary incontinence with acute change in chronic low back pain with midline spinal tenderness will  obtain lumbar imaging.  DRE performed without evidence of decreased rectal tone.  Bilateral lower extremity strength 5/5.  As patient has reported urinary incontinence will perform postvoid residual.  Postvoid residual within normal limits.  Will obtain MRI imaging of lower lumbar spine.  Laboratory studies without significant leukocytosis, anemia, electrolyte abnormalities and urinalysis not evidence of infection.  MRI spine without evidence of acute injury.  On reevaluation patient did continue to have lower back pain.  Patient given multimodal pain control.  Ambulation trial performed and  patient was able to ambulate without difficulty after multimodal pain control.  As there is no evidence of acute injury on MRI imaging and no concern for spinal cord compression believe patient is safe to be discharged at this time.  Patient was given strict return precautions, advised to follow-up with primary care provider and agreed with and understood plan of care at time of discharge     Final diagnoses:  Acute midline low back pain without sciatica    ED Discharge Orders          Ordered    oxyCODONE  (ROXICODONE ) 5 MG immediate release tablet  Every 6 hours PRN        07/30/24 2323            Lavanda Bolster DO Emergency Medicine PGY2    Bolster Lavanda, DO 07/30/24 7664    Randol Simmonds, MD 07/31/24 [phone]

## 2024-08-02 LAB — URINE CULTURE: Culture: 10000 — AB

## 2024-08-03 NOTE — Progress Notes (Signed)
 ED Antimicrobial Stewardship Positive Culture Follow Up   Kristin [name] is an 83 y.o. female who presented to [facility] on 07/30/2024 with a chief complaint of  Chief Complaint  Patient presents with   Back Pain    Recent Results (from the past 720 hours)  Urine Culture     Status: Abnormal   Collection Time: 07/30/24  3:40 PM   Specimen: In/Out Cath Urine  Result Value Ref Range Status   Specimen Description IN/OUT CATH URINE  Final   Special Requests NONE  Final   Culture (A)  Final    10,000 COLONIES/mL ESCHERICHIA COLI 10,000 COLONIES/mL AEROCOCCUS SPECIES Standardized susceptibility testing for this organism is not available. Performed at [facility], [address] [place], [place] [address]    Report Status 08/02/2024 FINAL  Final   Organism ID, Bacteria ESCHERICHIA COLI (A)  Final      Susceptibility   Escherichia coli - MIC*    AMPICILLIN 4 SENSITIVE Sensitive     CEFAZOLIN  (URINE) Value in next row Sensitive      2 SENSITIVEThis is a modified FDA-approved test that has been validated and its performance characteristics determined by the reporting laboratory.  This laboratory is certified under the Clinical Laboratory Improvement Amendments CLIA as qualified to perform high complexity clinical laboratory testing.    CEFEPIME  Value in next row Sensitive      2 SENSITIVEThis is a modified FDA-approved test that has been validated and its performance characteristics determined by the reporting laboratory.  This laboratory is certified under the Clinical Laboratory Improvement Amendments CLIA as qualified to perform high complexity clinical laboratory testing.    ERTAPENEM Value in next row Sensitive      2 SENSITIVEThis is a modified FDA-approved test that has been validated and its performance characteristics determined by the reporting laboratory.  This laboratory is certified under the Clinical Laboratory Improvement Amendments CLIA as qualified to perform high  complexity clinical laboratory testing.    CEFTRIAXONE Value in next row Sensitive      2 SENSITIVEThis is a modified FDA-approved test that has been validated and its performance characteristics determined by the reporting laboratory.  This laboratory is certified under the Clinical Laboratory Improvement Amendments CLIA as qualified to perform high complexity clinical laboratory testing.    CIPROFLOXACIN Value in next row Sensitive      2 SENSITIVEThis is a modified FDA-approved test that has been validated and its performance characteristics determined by the reporting laboratory.  This laboratory is certified under the Clinical Laboratory Improvement Amendments CLIA as qualified to perform high complexity clinical laboratory testing.    GENTAMICIN Value in next row Sensitive      2 SENSITIVEThis is a modified FDA-approved test that has been validated and its performance characteristics determined by the reporting laboratory.  This laboratory is certified under the Clinical Laboratory Improvement Amendments CLIA as qualified to perform high complexity clinical laboratory testing.    NITROFURANTOIN Value in next row Sensitive      2 SENSITIVEThis is a modified FDA-approved test that has been validated and its performance characteristics determined by the reporting laboratory.  This laboratory is certified under the Clinical Laboratory Improvement Amendments CLIA as qualified to perform high complexity clinical laboratory testing.    TRIMETH/SULFA Value in next row Sensitive      2 SENSITIVEThis is a modified FDA-approved test that has been validated and its performance characteristics determined by the reporting laboratory.  This laboratory is certified under the  Clinical Laboratory Improvement Amendments CLIA as qualified to perform high complexity clinical laboratory testing.    AMPICILLIN/SULBACTAM Value in next row Sensitive      2 SENSITIVEThis is a modified FDA-approved test that has been  validated and its performance characteristics determined by the reporting laboratory.  This laboratory is certified under the Clinical Laboratory Improvement Amendments CLIA as qualified to perform high complexity clinical laboratory testing.    PIP/TAZO Value in next row Sensitive ug/mL     <=4 SENSITIVEThis is a modified FDA-approved test that has been validated and its performance characteristics determined by the reporting laboratory.  This laboratory is certified under the Clinical Laboratory Improvement Amendments CLIA as qualified to perform high complexity clinical laboratory testing.    MEROPENEM Value in next row Sensitive      <=4 SENSITIVEThis is a modified FDA-approved test that has been validated and its performance characteristics determined by the reporting laboratory.  This laboratory is certified under the Clinical Laboratory Improvement Amendments CLIA as qualified to perform high complexity clinical laboratory testing.    * 10,000 COLONIES/mL ESCHERICHIA COLI    ED Provider: Lavonia Pat, MD   Plan:  - No treatment warranted at this time given unremarkable UA, afebrile, and insignificant colonies count  Feliciano Close, PharmD PGY2 Infectious Diseases Pharmacy Resident  08/03/2024 9:13 AM

## 2024-08-04 DIAGNOSIS — H53483 Generalized contraction of visual field, bilateral: Secondary | ICD-10-CM | POA: Diagnosis not present

## 2024-08-04 DIAGNOSIS — H02423 Myogenic ptosis of bilateral eyelids: Secondary | ICD-10-CM | POA: Diagnosis not present

## 2024-08-04 DIAGNOSIS — H02831 Dermatochalasis of right upper eyelid: Secondary | ICD-10-CM | POA: Diagnosis not present

## 2024-08-04 DIAGNOSIS — H02834 Dermatochalasis of left upper eyelid: Secondary | ICD-10-CM | POA: Diagnosis not present

## 2024-08-10 ENCOUNTER — Telehealth: Payer: Self-pay | Admitting: Cardiology

## 2024-08-10 NOTE — Telephone Encounter (Signed)
 Spoke with pt daughter okay per DPR.  Report pt has had increased urination for about 1 week.  Pt has arthritis in back that is pretty severe but pain is worse.  Pt had an ED visit on 07/30/24 urinalysis performed.  Daughter reports there was no f/u on urinalysis.    Advised daughter to contact PCP office to f/u urinalysis and to determine if f/u care is needed.  Daughter expresses understanding will relay message to pt.

## 2024-08-10 NOTE — Telephone Encounter (Signed)
 Patient stated she is urinating too frequently and wants to know if this is related to her medication.  Patient noted she has trouble walking and is having lower back pain.  Patient also noted she gets constipated for up to 8 days.

## 2024-08-11 DIAGNOSIS — R350 Frequency of micturition: Secondary | ICD-10-CM | POA: Diagnosis not present

## 2024-08-11 DIAGNOSIS — M549 Dorsalgia, unspecified: Secondary | ICD-10-CM | POA: Diagnosis not present

## 2024-08-11 DIAGNOSIS — K5900 Constipation, unspecified: Secondary | ICD-10-CM | POA: Diagnosis not present

## 2024-08-16 DIAGNOSIS — N3941 Urge incontinence: Secondary | ICD-10-CM | POA: Diagnosis not present

## 2024-08-16 DIAGNOSIS — K5900 Constipation, unspecified: Secondary | ICD-10-CM | POA: Diagnosis not present

## 2024-08-16 DIAGNOSIS — R300 Dysuria: Secondary | ICD-10-CM | POA: Diagnosis not present

## 2024-08-20 ENCOUNTER — Other Ambulatory Visit: Payer: Self-pay | Admitting: Cardiology

## 2024-08-20 DIAGNOSIS — I5032 Chronic diastolic (congestive) heart failure: Secondary | ICD-10-CM

## 2024-08-23 ENCOUNTER — Ambulatory Visit: Admitting: Orthopaedic Surgery

## 2024-08-24 DIAGNOSIS — E673 Hypervitaminosis D: Secondary | ICD-10-CM | POA: Diagnosis not present

## 2024-08-24 DIAGNOSIS — R350 Frequency of micturition: Secondary | ICD-10-CM | POA: Diagnosis not present

## 2024-08-24 DIAGNOSIS — I10 Essential (primary) hypertension: Secondary | ICD-10-CM | POA: Diagnosis not present

## 2024-08-24 DIAGNOSIS — E1122 Type 2 diabetes mellitus with diabetic chronic kidney disease: Secondary | ICD-10-CM | POA: Diagnosis not present

## 2024-08-24 DIAGNOSIS — R32 Unspecified urinary incontinence: Secondary | ICD-10-CM | POA: Diagnosis not present

## 2024-08-24 DIAGNOSIS — N184 Chronic kidney disease, stage 4 (severe): Secondary | ICD-10-CM | POA: Diagnosis not present

## 2024-08-24 NOTE — Anesthesia Preprocedure Evaluation (Addendum)
 Procedure Information:  Date/Time: 09/03/24 1400   Procedure: BLEPHAROPTOSIS, BILATERAL (Bilateral)   Anesthesia type: General   Pre-op diagnosis: MYOGENIC PTOSIS OF BILATERAL EYES   Location: [facility]EX / [facility]   Surgeons: Odella Majestic, MD     Anesthesia Evaluation    History of anesthetic complications PONV  No family history of anesthetic complications  Airway  Mallampati: II TM distance: >3 FB Jaw Protrusion: normal Neck ROM: full Mouth Opening: normal   Dental - normal exam     Pulmonary - normal exam   breath sounds clear to auscultation  Cardiovascular - normal exam Exercise tolerance: good ( Able to do > 4 mets) (+) hyperlipidemia, hypertension well controlled, past MI (NSTEMI (08/2020),), CAD, dysrhythmias and PAF, CHF (ICM) and EF reduced EF%45-50, , pulmonary hypertension (PASP 59 mmHg.) moderate, cardiac stents (DES to mid-prox LAD 05/2021), valvular problems/murmurs MR moderate TR moderate  Rhythm: regular Rate: normal   Neuro/Psych   (+) CVA (memory issues) residual symptoms  GI/Hepatic/Renal   (+) GERD, chronic renal disease CKD    Endo/Other   (+) diabetes mellitus type 2, morbid obesity class I   Abdominal  - normal exam  OB/GYN      HEENT      Additional Comments:  Paroxysmal atrial fibrillation, NSTEMI (08/2020), multivessel CAD, recovered ischemic cardiomyopathy, chronic HFpEF/stage C/NYHA class II, hx of cerebellar stroke, lumbar stenosis status post laminectomy, hypertension, CKD stage III/IV, diabetes miellitus type 2  Echocardiogram 07/06/2021:  Left ventricle cavity is normal in size. Mild concentric hypertrophy of  the left ventricle. Mid to distal anteroseptal/anterior mild hypokinesis.  Mild global hypokinesis. LVEF 40-45%. Doppler evidence of grade II  (pseudonormal) diastolic dysfunction, elevated LAP.  Left atrial cavity is moderately dilated.  Moderate (Grade II) mitral regurgitation. Mildly restricted mitral valve   leaflets.  Moderate tricuspid regurgitation. Moderate pulmonary hypertension.  Estimated pulmonary artery systolic pressure 59 mmHg.  Mild pulmonic regurgitation.   CATH 05/2021 Narrative This result has an attachment that is not available. Prox-mid LAD 60-99% stenosis Successful percutaneous coronary intervention mid-prox LAD PTCA and stent placement 3.5 X 32 mm Synergy drug-eluting stent Proximal post dilatation using 4.0X15 mm balloon at 22 atm         Past Medical History[1]   Current Outpatient Medications  Medication Instructions  . apixaban  (ELIQUIS ) 2.5 mg, 2 times a day (standard)  . carvedilol  (COREG ) 12.5 mg, 2 times a day (standard)  . dapagliflozin  propanediol (FARXIGA ) 10 mg, Daily before breakfast  . ezetimibe  (ZETIA ) 10 mg, Daily (standard)  . hydrALAZINE  (APRESOLINE ) 100 mg, 3 times a day (standard)  . isosorbide  mononitrate (IMDUR ) 120 mg, Daily (standard)  . nitroglycerin  (NITROSTAT ) 0.4 mg, Every 5 min PRN  . pantoprazole  (PROTONIX ) 40 mg, Daily (standard)  . rosuvastatin  (CRESTOR ) 20 mg, Daily (standard)  . sacubitril -valsartan  (ENTRESTO ) 49-51 mg tablet 1 tablet, 2 times a day (standard)     No results found for: WBC, HGB, HCT, PLT  No results found for: NA, K, CL, GLU, CO2, BUN, CREATININE, CALCIUM , MG, PHOS  No results found for: ALKPHOS, BILITOT, BILIDIR, PROT, ALBUMIN, ALT, AST, GGT  No results found for: PT, INR, APTT   No results found for this visit on 09/03/24 (from the past 4464 hours).           Anesthesia Plan  ASA 3     Anesthetic:  General  Standard lines and monitors.  Type of induction: IV Airway: endotracheal tube                                            (  Hold Eliquis  x24 hours per Cardiology)  Anesthesia plan and risk discussed with patient; informed consent obtained.  Use of blood products discussed with patient, whom.     Plan discussed with  CRNA.    Relevant Problems  No relevant active problems        [1] Past Medical History: Diagnosis Date  . Arthritis   . CHF (congestive heart failure)    (CMS-HCC)   . CKD (chronic kidney disease)   . Coronary artery disease   . Diabetes mellitus    (CMS-HCC)   . GERD (gastroesophageal reflux disease)   . HLD (hyperlipidemia)   . Hypertension   . Myocardial infarction    (CMS-HCC) 2022  . PONV (postoperative nausea and vomiting)   . Stroke    (CMS-HCC) 2021

## 2024-08-31 ENCOUNTER — Telehealth: Payer: Self-pay | Admitting: *Deleted

## 2024-08-31 ENCOUNTER — Telehealth: Payer: Self-pay

## 2024-08-31 NOTE — Telephone Encounter (Signed)
 Left message for the pt to call back ASAP as she is going to need to be scheduled tomorrow for preop tele appt.  Request received today 9/23 or procedure 09/03/24.

## 2024-08-31 NOTE — Telephone Encounter (Signed)
 Pharmacy, can you please comment on how long Eliquis  can be held for upcoming procedure on Friday?  Thank you!

## 2024-08-31 NOTE — Telephone Encounter (Signed)
 Pt's son is returning nurse's call to set up tele pre op appt  Call back number for son is [phone]

## 2024-08-31 NOTE — Telephone Encounter (Signed)
   Pre-operative Risk Assessment    Patient Name: Kristin [name]  DOB: 25-Jul-1941 MRN: [id number]   Date of last office visit: 04/09/24 DR. TOLIA Date of next office visit: NONE   Request for Surgical Clearance    Procedure:  B/L UPPER EYELID INTERNAL BLEPHAROPTOSIS  Date of Surgery:  Clearance 09/03/24                                Surgeon:  DR. TAL RUBINSTEIN Surgeon's Group or Practice Name:  [facility]  Phone number:  [phone] Fax number:  [phone]   Type of Clearance Requested:   - Medical  - Pharmacy:  Hold Apixaban  (Eliquis )     Type of Anesthesia:  General    Additional requests/questions:    Bonney Niels Jest   08/31/2024, 12:58 PM

## 2024-08-31 NOTE — Telephone Encounter (Signed)
 Patient with diagnosis of Afib on Eliquis  for anticoagulation.    Procedure:  B/L UPPER EYELID INTERNAL BLEPHAROPTOSIS   Date of Surgery:  09/03/24    CHA2DS2-VASc Score = 9   This indicates a 12.2% annual risk of stroke. The patient's score is based upon: CHF History: 1 HTN History: 1 Diabetes History: 1 Stroke History: 2 Vascular Disease History: 1 Age Score: 2 Gender Score: 1    CrCl  31 ml/min Platelet count 163 K  Patient has not had an Afib/aflutter ablation within the last 3 months or DCCV within the last 30 days  Per office protocol, patient can hold Eliquis  for 1 day prior to procedure.    Patient will not need bridging with Lovenox (enoxaparin) around procedure.  **This guidance is not considered finalized until pre-operative APP has relayed final recommendations.**

## 2024-08-31 NOTE — Telephone Encounter (Signed)
 Appointment scheduled for 09/01/2024. Med req and consent are complete. Call patient at [phone] or son Sheila at [phone] if pateint does not answer. He will make sure to get her on the phone.

## 2024-08-31 NOTE — Telephone Encounter (Signed)
  Patient Consent for Virtual Visit        Kristin [name] has provided verbal consent on 08/31/2024 for a virtual visit (video or telephone). Appointment scheduled for 09/01/2024. Med req and consent are complete. Call patient at [phone] or son Sheila at [phone] if pateint does not answer. He will make sure to get her on the phone.    CONSENT FOR VIRTUAL VISIT FOR:  Kristin [name]  By participating in this virtual visit I agree to the following:  I hereby voluntarily request, consent and authorize [facility] and its employed or contracted physicians, physician assistants, nurse practitioners or other licensed health care professionals (the Practitioner), to provide me with telemedicine health care services (the "Services) as deemed necessary by the treating Practitioner. I acknowledge and consent to receive the Services by the Practitioner via telemedicine. I understand that the telemedicine visit will involve communicating with the Practitioner through live audiovisual communication technology and the disclosure of certain medical information by electronic transmission. I acknowledge that I have been given the opportunity to request an in-person assessment or other available alternative prior to the telemedicine visit and am voluntarily participating in the telemedicine visit.  I understand that I have the right to withhold or withdraw my consent to the use of telemedicine in the course of my care at any time, without affecting my right to future care or treatment, and that the Practitioner or I may terminate the telemedicine visit at any time. I understand that I have the right to inspect all information obtained and/or recorded in the course of the telemedicine visit and may receive copies of available information for a reasonable fee.  I understand that some of the potential risks of receiving the Services via telemedicine include:  Delay or interruption in medical  evaluation due to technological equipment failure or disruption; Information transmitted may not be sufficient (e.g. poor resolution of images) to allow for appropriate medical decision making by the Practitioner; and/or  In rare instances, security protocols could fail, causing a breach of personal health information.  Furthermore, I acknowledge that it is my responsibility to provide information about my medical history, conditions and care that is complete and accurate to the best of my ability. I acknowledge that Practitioner's advice, recommendations, and/or decision may be based on factors not within their control, such as incomplete or inaccurate data provided by me or distortions of diagnostic images or specimens that may result from electronic transmissions. I understand that the practice of medicine is not an exact science and that Practitioner makes no warranties or guarantees regarding treatment outcomes. I acknowledge that a copy of this consent can be made available to me via my patient portal [facility] MyChart), or I can request a printed copy by calling the office of [facility].    I understand that my insurance will be billed for this visit.   I have read or had this consent read to me. I understand the contents of this consent, which adequately explains the benefits and risks of the Services being provided via telemedicine.  I have been provided ample opportunity to ask questions regarding this consent and the Services and have had my questions answered to my satisfaction. I give my informed consent for the services to be provided through the use of telemedicine in my medical care

## 2024-08-31 NOTE — Telephone Encounter (Signed)
   Name: Kristin [name]  DOB: June 23, 1941  MRN: [id number]  Primary Cardiologist: Madonna Large, DO   Preoperative team, please contact this patient and set up a phone call appointment for further preoperative risk assessment. Please obtain consent and complete medication review. Thank you for your help.  I confirm that guidance regarding antiplatelet and oral anticoagulation therapy has been completed and, if necessary, noted below.Per office protocol, patient can hold Eliquis  for 1 day prior to procedure.  Patient will not need bridging with Lovenox (enoxaparin) around procedure.  I also confirmed the patient resides in the state of [place] . As per [facility] Medical Board telemedicine laws, the patient must reside in the state in which the provider is licensed.   Selassie Spatafore E Camisha Srey, PA-C 08/31/2024, 1:50 PM [facility]

## 2024-09-01 ENCOUNTER — Ambulatory Visit: Attending: Cardiology | Admitting: Emergency Medicine

## 2024-09-01 DIAGNOSIS — Z01810 Encounter for preprocedural cardiovascular examination: Secondary | ICD-10-CM | POA: Diagnosis not present

## 2024-09-01 NOTE — Progress Notes (Signed)
 Virtual Visit via Telephone Note   Because of Kristin [name] co-morbid illnesses, she is at least at moderate risk for complications without adequate follow up.  This format is felt to be most appropriate for this patient at this time.  Due to technical limitations with video connection (technology), today's appointment will be conducted as an audio only telehealth visit, and Shareese Macha verbally agreed to proceed in this manner.   All issues noted in this document were discussed and addressed.  No physical exam could be performed with this format.  Evaluation Performed:  Preoperative cardiovascular risk assessment _____________   Date:  09/01/2024   Patient ID:  Kristin [name], DOB Apr 10, 1941, MRN [id number] Patient Location:  Home Provider location:   Office  Primary Care Provider:  Rolinda Millman, MD Primary Cardiologist:  Madonna Large, DO  Chief Complaint / Patient Profile   83 y.o. y/o female with a h/o proximal atrial fibrillation, NSTEMI on 08/2020, multivessel CAD, recovered ischemic cardiomyopathy, chronic HFpEF, history of cerebellar stroke, lumbar stenosis s/p post laminectomy, hypertension, CKD stage III/IV, diabetes type 2, postmenopausal female who is pending B/L upper eyelid internal blepharoptosis on 09/03/2024 with [employer] [facility] and presents today for telephonic preoperative cardiovascular risk assessment.  History of Present Illness    Kristin [name] is a 83 y.o. female who presents via audio/video conferencing for a telehealth visit today.  Pt was last seen in cardiology clinic on 04/09/2024 by Dr. Large.  At that time Sharla Tankard was doing well without anginal symptoms.  The patient is now pending procedure as outlined above. Since her last visit, she denies chest pain, shortness of breath, lower extremity edema, fatigue, palpitations, melena, hematuria, hemoptysis, diaphoresis, weakness, presyncope, syncope, orthopnea, and PND.  Today patient is without  anginal symptoms.  She tells me she is doing well today.  Her only complaint is lower back pain which is chronic for her.  She stays fairly active doing household work and walking up and down her driveway for exercise.  She denies any exertional symptoms.  Overall she is without anginal chest pains.  She is able to complete greater than 4 METS.  Past Medical History    Past Medical History:  Diagnosis Date   Atrial fibrillation (HCC)    CHF (congestive heart failure) (HCC)    Coronary artery disease    Diabetes mellitus without complication (HCC)    Hypercholesteremia    Hypertension    Renal disorder    CKD Stage IV   Stroke [facility])    Past Surgical History:  Procedure Laterality Date   CORONARY STENT INTERVENTION N/A 05/15/2021   Procedure: CORONARY STENT INTERVENTION;  Surgeon: Elmira Newman PARAS, MD;  Location: [facility] INVASIVE CV LAB;  Service: Cardiovascular;  Laterality: N/A;   LEFT HEART CATH AND CORONARY ANGIOGRAPHY N/A 05/11/2021   Procedure: LEFT HEART CATH AND CORONARY ANGIOGRAPHY;  Surgeon: Elmira Newman PARAS, MD;  Location: [facility] INVASIVE CV LAB;  Service: Cardiovascular;  Laterality: N/A;   LUMBAR LAMINECTOMY/DECOMPRESSION MICRODISCECTOMY N/A 08/19/2020   Procedure: LUMBAR LAMINECTOMY/DECOMPRESSION  Lumbar two-three, Lumbar three-four;  Surgeon: Dawley, Lani BROCKS, DO;  Location: [facility] OR;  Service: Neurosurgery;  Laterality: N/A;   RIGHT/LEFT HEART CATH AND CORONARY ANGIOGRAPHY N/A 08/23/2020   Procedure: RIGHT/LEFT HEART CATH AND CORONARY ANGIOGRAPHY;  Surgeon: Ladona Heinz, MD;  Location: [facility] INVASIVE CV LAB;  Service: Cardiovascular;  Laterality: N/A;    Allergies  Allergies  Allergen Reactions   Pork-Derived Products     No pork shrimp or lobster for  religious beliefs   Tramadol Nausea And Vomiting    Home Medications    Prior to Admission medications   Medication Sig Start Date End Date Taking? Authorizing Provider  acetaminophen  (TYLENOL ) 500 MG tablet Take 1,000 mg by mouth  in the morning, at noon, and at bedtime.    [provider]  apixaban  (ELIQUIS ) 2.5 MG TABS tablet Take 1 tablet by mouth twice daily 05/24/24   Tolia, Sunit, DO  carvedilol  (COREG ) 12.5 MG tablet TAKE 1 TABLET BY MOUTH TWICE DAILY WITH A MEAL 07/28/24   Tolia, Sunit, DO  cyclobenzaprine  (FLEXERIL ) 10 MG tablet Take 0.5 tablets (5 mg total) by mouth 2 (two) times daily as needed for muscle spasms. 09/02/23   Odell Balls, PA-C  diclofenac  Sodium (VOLTAREN ) 1 % GEL Apply 2 g topically 4 (four) times daily. 08/30/20   Cheryle Page, MD  ezetimibe  (ZETIA ) 10 MG tablet Take 1 tablet by mouth once daily Patient taking differently: Take 10 mg by mouth in the morning. 10/06/23   Tolia, Sunit, DO  FARXIGA  10 MG TABS tablet TAKE 1 TABLET BY MOUTH ONCE DAILY BEFORE BREAKFAST 08/20/24   Tolia, Sunit, DO  hydrALAZINE  (APRESOLINE ) 100 MG tablet Take 1 tablet (100 mg total) by mouth 3 (three) times daily. 10/10/23   Wyn Jackee VEAR Mickey., NP  isosorbide  mononitrate (IMDUR ) 120 MG 24 hr tablet Take 1 tablet by mouth once daily Patient taking differently: Take 120 mg by mouth in the morning. 01/06/24   Dick, Ernest H Jr., NP  Multiple Vitamin (MULTIVITAMIN WITH MINERALS) TABS tablet Take 1 tablet by mouth in the morning.    [provider]  nitroGLYCERIN  (NITROSTAT ) 0.4 MG SL tablet Place 1 tablet (0.4 mg total) under the tongue every 5 (five) minutes as needed for chest pain. 04/09/24   Tolia, Sunit, DO  pantoprazole  (PROTONIX ) 40 MG tablet Take 1 tablet (40 mg total) by mouth daily. Patient taking differently: Take 40 mg by mouth in the morning. 08/30/20   Cheryle Page, MD  rosuvastatin  (CRESTOR ) 40 MG tablet Take 1 tablet (40 mg total) by mouth at bedtime. Patient taking differently: Take 40 mg by mouth in the morning. 10/10/23   Wyn Jackee VEAR Mickey., NP  sacubitril -valsartan  (ENTRESTO ) 49-51 MG Take 1 tablet by mouth twice daily 01/27/24   Wyn Jackee VEAR Mickey., NP    Physical Exam    Vital Signs:   Starlette Thurow does not have vital signs available for review today.  Given telephonic nature of communication, physical exam is limited. AAOx3. NAD. Normal affect.  Speech and respirations are unlabored.  Accessory Clinical Findings    None  Assessment & Plan    1.  Preoperative Cardiovascular Risk Assessment: According to the Revised Cardiac Risk Index (RCRI), her Perioperative Risk of Major Cardiac Event is (%): 11. Her Functional Capacity in METs is: 5.07 according to the Duke Activity Status Index (DASI).  Therefore, based on ACC/AHA guidelines, patient would be at acceptable risk for the planned procedure without further cardiovascular testing.  The patient was advised that if she develops new symptoms prior to surgery to contact our office to arrange for a follow-up visit, and she verbalized understanding.  Per office protocol, patient can hold Eliquis  for 1 day prior to procedure.     Patient will not need bridging with Lovenox (enoxaparin) around procedure.  A copy of this note will be routed to requesting surgeon.  Time:   Today, I have spent 8 minutes with the patient with  telehealth technology discussing medical history, symptoms, and management plan.     Lum LITTIE Louis, NP  09/01/2024, 2:31 PM

## 2024-09-03 DIAGNOSIS — Z885 Allergy status to narcotic agent status: Secondary | ICD-10-CM | POA: Diagnosis not present

## 2024-09-03 DIAGNOSIS — Z79899 Other long term (current) drug therapy: Secondary | ICD-10-CM | POA: Diagnosis not present

## 2024-09-03 DIAGNOSIS — I5020 Unspecified systolic (congestive) heart failure: Secondary | ICD-10-CM | POA: Diagnosis not present

## 2024-09-03 DIAGNOSIS — I5042 Chronic combined systolic (congestive) and diastolic (congestive) heart failure: Secondary | ICD-10-CM | POA: Diagnosis not present

## 2024-09-03 DIAGNOSIS — Z7901 Long term (current) use of anticoagulants: Secondary | ICD-10-CM | POA: Diagnosis not present

## 2024-09-03 DIAGNOSIS — I480 Paroxysmal atrial fibrillation: Secondary | ICD-10-CM | POA: Diagnosis not present

## 2024-09-03 DIAGNOSIS — I2510 Atherosclerotic heart disease of native coronary artery without angina pectoris: Secondary | ICD-10-CM | POA: Diagnosis not present

## 2024-09-03 DIAGNOSIS — Z7984 Long term (current) use of oral hypoglycemic drugs: Secondary | ICD-10-CM | POA: Diagnosis not present

## 2024-09-03 DIAGNOSIS — N189 Chronic kidney disease, unspecified: Secondary | ICD-10-CM | POA: Diagnosis not present

## 2024-09-03 DIAGNOSIS — E1122 Type 2 diabetes mellitus with diabetic chronic kidney disease: Secondary | ICD-10-CM | POA: Diagnosis not present

## 2024-09-03 DIAGNOSIS — E785 Hyperlipidemia, unspecified: Secondary | ICD-10-CM | POA: Diagnosis not present

## 2024-09-03 DIAGNOSIS — H02403 Unspecified ptosis of bilateral eyelids: Secondary | ICD-10-CM | POA: Diagnosis not present

## 2024-09-03 DIAGNOSIS — H53483 Generalized contraction of visual field, bilateral: Secondary | ICD-10-CM | POA: Diagnosis not present

## 2024-09-03 DIAGNOSIS — N183 Chronic kidney disease, stage 3 unspecified: Secondary | ICD-10-CM | POA: Diagnosis not present

## 2024-09-03 DIAGNOSIS — I130 Hypertensive heart and chronic kidney disease with heart failure and stage 1 through stage 4 chronic kidney disease, or unspecified chronic kidney disease: Secondary | ICD-10-CM | POA: Diagnosis not present

## 2024-09-03 DIAGNOSIS — H02423 Myogenic ptosis of bilateral eyelids: Secondary | ICD-10-CM | POA: Diagnosis not present

## 2024-09-08 DIAGNOSIS — D649 Anemia, unspecified: Secondary | ICD-10-CM | POA: Diagnosis not present

## 2024-10-01 ENCOUNTER — Emergency Department (HOSPITAL_COMMUNITY)

## 2024-10-01 ENCOUNTER — Other Ambulatory Visit: Payer: Self-pay

## 2024-10-01 ENCOUNTER — Emergency Department (HOSPITAL_COMMUNITY)
Admission: EM | Admit: 2024-10-01 | Discharge: 2024-10-01 | Disposition: A | Discharge status: Home / Self Care | Attending: Emergency Medicine | Admitting: Emergency Medicine

## 2024-10-01 DIAGNOSIS — I2510 Atherosclerotic heart disease of native coronary artery without angina pectoris: Secondary | ICD-10-CM | POA: Diagnosis not present

## 2024-10-01 DIAGNOSIS — M4807 Spinal stenosis, lumbosacral region: Secondary | ICD-10-CM | POA: Diagnosis not present

## 2024-10-01 DIAGNOSIS — G8929 Other chronic pain: Secondary | ICD-10-CM | POA: Insufficient documentation

## 2024-10-01 DIAGNOSIS — I509 Heart failure, unspecified: Secondary | ICD-10-CM | POA: Insufficient documentation

## 2024-10-01 DIAGNOSIS — E1122 Type 2 diabetes mellitus with diabetic chronic kidney disease: Secondary | ICD-10-CM | POA: Diagnosis not present

## 2024-10-01 DIAGNOSIS — M5126 Other intervertebral disc displacement, lumbar region: Secondary | ICD-10-CM | POA: Diagnosis not present

## 2024-10-01 DIAGNOSIS — I130 Hypertensive heart and chronic kidney disease with heart failure and stage 1 through stage 4 chronic kidney disease, or unspecified chronic kidney disease: Secondary | ICD-10-CM | POA: Diagnosis not present

## 2024-10-01 DIAGNOSIS — M549 Dorsalgia, unspecified: Secondary | ICD-10-CM | POA: Insufficient documentation

## 2024-10-01 DIAGNOSIS — M48061 Spinal stenosis, lumbar region without neurogenic claudication: Secondary | ICD-10-CM | POA: Diagnosis not present

## 2024-10-01 DIAGNOSIS — N281 Cyst of kidney, acquired: Secondary | ICD-10-CM | POA: Diagnosis not present

## 2024-10-01 DIAGNOSIS — N184 Chronic kidney disease, stage 4 (severe): Secondary | ICD-10-CM | POA: Diagnosis not present

## 2024-10-01 DIAGNOSIS — Z7901 Long term (current) use of anticoagulants: Secondary | ICD-10-CM | POA: Diagnosis not present

## 2024-10-01 DIAGNOSIS — M5127 Other intervertebral disc displacement, lumbosacral region: Secondary | ICD-10-CM | POA: Diagnosis not present

## 2024-10-01 DIAGNOSIS — M5459 Other low back pain: Secondary | ICD-10-CM | POA: Diagnosis not present

## 2024-10-01 DIAGNOSIS — M47816 Spondylosis without myelopathy or radiculopathy, lumbar region: Secondary | ICD-10-CM | POA: Diagnosis not present

## 2024-10-01 LAB — URINALYSIS, ROUTINE W REFLEX MICROSCOPIC
Bacteria, UA: NONE SEEN
Bilirubin Urine: NEGATIVE
Glucose, UA: >=500 mg/dL — AB
Hgb urine dipstick: NEGATIVE
Ketones, ur: NEGATIVE mg/dL
Leukocytes,Ua: NEGATIVE
Nitrite: NEGATIVE
Protein, ur: NEGATIVE mg/dL
Specific Gravity, Urine: 1.012 (ref 1.005–1.030)
pH: 6 (ref 5.0–8.0)

## 2024-10-01 LAB — COMPREHENSIVE METABOLIC PANEL WITH GFR
ALT: 9 U/L (ref 0–44)
AST: 16 U/L (ref 15–41)
Albumin: 3.4 g/dL — ABNORMAL LOW (ref 3.5–5.0)
Alkaline Phosphatase: 47 U/L (ref 38–126)
Anion gap: 11 (ref 5–15)
BUN: 21 mg/dL (ref 8–23)
CO2: 25 mmol/L (ref 22–32)
Calcium: 9.4 mg/dL (ref 8.9–10.3)
Chloride: 101 mmol/L (ref 98–111)
Creatinine, Ser: 1.42 mg/dL — ABNORMAL HIGH (ref 0.44–1.00)
GFR, Estimated: 36 mL/min — ABNORMAL LOW (ref >60)
Glucose, Bld: 106 mg/dL — ABNORMAL HIGH (ref 70–99)
Potassium: 4.2 mmol/L (ref 3.5–5.1)
Sodium: 137 mmol/L (ref 135–145)
Total Bilirubin: 0.7 mg/dL (ref 0.0–1.2)
Total Protein: 7 g/dL (ref 6.5–8.1)

## 2024-10-01 LAB — CBC WITH DIFFERENTIAL/PLATELET
Abs Immature Granulocytes: 0 K/uL (ref 0.00–0.07)
Basophils Absolute: 0 K/uL (ref 0.0–0.1)
Basophils Relative: 1 %
Eosinophils Absolute: 0.1 K/uL (ref 0.0–0.5)
Eosinophils Relative: 2 %
HCT: 34.9 % — ABNORMAL LOW (ref 36.0–46.0)
Hemoglobin: 11.1 g/dL — ABNORMAL LOW (ref 12.0–15.0)
Immature Granulocytes: 0 %
Lymphocytes Relative: 38 %
Lymphs Abs: 1.4 K/uL (ref 0.7–4.0)
MCH: 31.3 pg (ref 26.0–34.0)
MCHC: 31.8 g/dL (ref 30.0–36.0)
MCV: 98.3 fL (ref 80.0–100.0)
Monocytes Absolute: 0.9 K/uL (ref 0.1–1.0)
Monocytes Relative: 24 %
Neutro Abs: 1.3 K/uL — ABNORMAL LOW (ref 1.7–7.7)
Neutrophils Relative %: 35 %
Platelets: 210 K/uL (ref 150–400)
RBC: 3.55 MIL/uL — ABNORMAL LOW (ref 3.87–5.11)
RDW: 13.6 % (ref 11.5–15.5)
WBC: 3.6 K/uL — ABNORMAL LOW (ref 4.0–10.5)
nRBC: 0 % (ref 0.0–0.2)

## 2024-10-01 LAB — LIPASE, BLOOD: Lipase: 20 U/L (ref 11–51)

## 2024-10-01 MED ORDER — FENTANYL CITRATE (PF) 50 MCG/ML IJ SOSY
50.0000 ug | PREFILLED_SYRINGE | Freq: Once | INTRAMUSCULAR | Status: AC
  Administered 2024-10-01: 50 ug via INTRAVENOUS
  Filled 2024-10-01: qty 1

## 2024-10-01 MED ORDER — ACETAMINOPHEN 500 MG PO TABS
1000.0000 mg | ORAL_TABLET | Freq: Once | ORAL | Status: AC
  Administered 2024-10-01: 1000 mg via ORAL
  Filled 2024-10-01: qty 2

## 2024-10-01 MED ORDER — SODIUM CHLORIDE 0.9 % IV BOLUS
500.0000 mL | Freq: Once | INTRAVENOUS | Status: AC
  Administered 2024-10-01: 500 mL via INTRAVENOUS

## 2024-10-01 MED ORDER — LIDOCAINE 5 % EX PTCH
1.0000 | MEDICATED_PATCH | CUTANEOUS | Status: DC
  Administered 2024-10-01: 1 via TRANSDERMAL
  Filled 2024-10-01: qty 1

## 2024-10-01 MED ORDER — PREDNISONE 20 MG PO TABS: 40.0000 mg | ORAL_TABLET | Freq: Every day | ORAL | 0 refills | Status: AC

## 2024-10-01 MED ORDER — GABAPENTIN 100 MG PO CAPS
100.0000 mg | ORAL_CAPSULE | Freq: Once | ORAL | Status: AC
  Administered 2024-10-01: 100 mg via ORAL
  Filled 2024-10-01: qty 1

## 2024-10-01 NOTE — ED Notes (Signed)
 Patient transported to MRI

## 2024-10-01 NOTE — ED Provider Notes (Signed)
 Kristin [name] EMERGENCY DEPARTMENT AT [facility] Provider Note   CSN: [id number] Arrival date & time: 10/01/24  0945     Patient presents with: Polyuria, Diarrhea, and Back Pain   Kristin [name] is a 83 y.o. female.   83 year old female presents today for concern of back pain.  She has history of chronic low back pain.  She was recently seen and had an MRI done as well.  At the time she had some urinary incontinence.  She denies this currently but states 2 weeks ago she had numbness to her bilateral lower extremities.  This also involves her upper extremities.  Same timeline.  she states this is new.  Denies any trauma to her spine.  No fever.  Ambulates well.  Denies any fever, or urinary complaints.  The history is provided by the patient. No language interpreter was used.       Prior to Admission medications   Medication Sig Start Date End Date Taking? Authorizing Provider  predniSONE  (DELTASONE ) 20 MG tablet Take 2 tablets (40 mg total) by mouth daily with breakfast for 5 days. 10/01/24 10/06/24 Yes Aran Menning, PA-C  acetaminophen  (TYLENOL ) 500 MG tablet Take 1,000 mg by mouth in the morning, at noon, and at bedtime.    [provider]  apixaban  (ELIQUIS ) 2.5 MG TABS tablet Take 1 tablet by mouth twice daily 05/24/24   Tolia, Sunit, DO  carvedilol  (COREG ) 12.5 MG tablet TAKE 1 TABLET BY MOUTH TWICE DAILY WITH A MEAL 07/28/24   Tolia, Sunit, DO  cyclobenzaprine  (FLEXERIL ) 10 MG tablet Take 0.5 tablets (5 mg total) by mouth 2 (two) times daily as needed for muscle spasms. 09/02/23   Odell Balls, PA-C  diclofenac  Sodium (VOLTAREN ) 1 % GEL Apply 2 g topically 4 (four) times daily. 08/30/20   Cheryle Page, MD  ezetimibe  (ZETIA ) 10 MG tablet Take 1 tablet by mouth once daily Patient taking differently: Take 10 mg by mouth in the morning. 10/06/23   Tolia, Sunit, DO  FARXIGA  10 MG TABS tablet TAKE 1 TABLET BY MOUTH ONCE DAILY BEFORE BREAKFAST 08/20/24   Tolia, Sunit,  DO  hydrALAZINE  (APRESOLINE ) 100 MG tablet Take 1 tablet (100 mg total) by mouth 3 (three) times daily. 10/10/23   Wyn Jackee VEAR Mickey., NP  isosorbide  mononitrate (IMDUR ) 120 MG 24 hr tablet Take 1 tablet by mouth once daily Patient taking differently: Take 120 mg by mouth in the morning. 01/06/24   Dick, Ernest H Jr., NP  Multiple Vitamin (MULTIVITAMIN WITH MINERALS) TABS tablet Take 1 tablet by mouth in the morning.    [provider]  nitroGLYCERIN  (NITROSTAT ) 0.4 MG SL tablet Place 1 tablet (0.4 mg total) under the tongue every 5 (five) minutes as needed for chest pain. 04/09/24   Tolia, Sunit, DO  pantoprazole  (PROTONIX ) 40 MG tablet Take 1 tablet (40 mg total) by mouth daily. Patient taking differently: Take 40 mg by mouth in the morning. 08/30/20   Cheryle Page, MD  rosuvastatin  (CRESTOR ) 40 MG tablet Take 1 tablet (40 mg total) by mouth at bedtime. Patient taking differently: Take 40 mg by mouth in the morning. 10/10/23   Wyn Jackee VEAR Mickey., NP  sacubitril -valsartan  (ENTRESTO ) 49-51 MG Take 1 tablet by mouth twice daily 01/27/24   Dick, Ernest H Jr., NP    Allergies: Porcine (pork) protein-containing drug products and Tramadol    Review of Systems  Constitutional:  Negative for chills and fever.  Respiratory:  Negative for shortness of breath.  Musculoskeletal:  Positive for back pain. Negative for gait problem.  Neurological:  Positive for numbness.  All other systems reviewed and are negative.   Updated Vital Signs BP (!) 153/97   Pulse 70   Temp 98.1 F (36.7 C) (Oral)   Resp 17   SpO2 100%   Physical Exam Vitals and nursing note reviewed.  Constitutional:      General: She is not in acute distress.    Appearance: Normal appearance. She is not ill-appearing.  HENT:     Head: Normocephalic and atraumatic.     Nose: Nose normal.  Eyes:     Conjunctiva/sclera: Conjunctivae normal.  Cardiovascular:     Rate and Rhythm: Normal rate and regular rhythm.  Pulmonary:      Effort: Pulmonary effort is normal. No respiratory distress.  Musculoskeletal:        General: No deformity.     Comments: Generalized tenderness over the spine including the midline and paraspinal muscles.  Good range of motion in bilateral lower extremities.  Sensation intact and symmetrical.  Range of motion in upper extremities with good strength.  Skin:    Findings: No rash.  Neurological:     Mental Status: She is alert.     (all labs ordered are listed, but only abnormal results are displayed) Labs Reviewed  COMPREHENSIVE METABOLIC PANEL WITH GFR - Abnormal; Notable for the following components:      Result Value   Glucose, Bld 106 (*)    Creatinine, Ser 1.42 (*)    Albumin 3.4 (*)    GFR, Estimated 36 (*)    All other components within normal limits  URINALYSIS, ROUTINE W REFLEX MICROSCOPIC - Abnormal; Notable for the following components:   Glucose, UA >=500 (*)    All other components within normal limits  CBC WITH DIFFERENTIAL/PLATELET - Abnormal; Notable for the following components:   WBC 3.6 (*)    RBC 3.55 (*)    Hemoglobin 11.1 (*)    HCT 34.9 (*)    Neutro Abs 1.3 (*)    All other components within normal limits  URINE CULTURE  LIPASE, BLOOD    EKG: None  Radiology: MR LUMBAR SPINE WO CONTRAST Result Date: 10/01/2024 EXAM: MRI LUMBAR SPINE 10/01/2024 06:37:42 PM TECHNIQUE: Multiplanar multisequence MRI of the lumbar spine was performed without the administration of intravenous contrast. COMPARISON: MRI 07/30/2024 5 CLINICAL HISTORY: Low back pain, cauda equina syndrome suspected. FINDINGS: BONES AND ALIGNMENT: Normal alignment. Normal vertebral body heights. Progressive right eccentric disease/discogenic endplate signal changes at L2-L3. SPINAL CORD: The conus terminates normally. SOFT TISSUES: Partially imaged left adnexal cyst.  Distended bladder. L1-L2: Left foraminal disc protrusion. No significant canal stenosis mild bilateral foraminal stenosis.  L2-L3: Posterior decompression. Bilateral facet arthropathy. Disc bulging and endplate spurring. Resulting moderate right and mild left foraminal stenosis, which is similar. No significant canal stenosis. L3-L4: Disc bulge and endplate spurring. Bilateral facet arthropathy. Posterior decompression. Resulting moderate left and mild right foraminal stenosis similar. Patent canal. L4-L5: Facet arthropathy. Mild disc bulging. Similar mild left foraminal stenosis. Patent canal and right foramen. L5-S1: Similar disc bulging and left foraminal disc protrusion. Similar moderate left foraminal stenosis with far left disc closely approximating the exiting left L5 nerve. No significant canal stenosis. IMPRESSION: 1. Similar multilevel foraminal stenosis, detailed above and including moderate foraminal stenosis on the left at L3-L4 and L5-S1 and the right at L2-L3. 2. No significant canal stenosis. 3. Progressive right eccentric degenerative/discogenic endplate changes at L2-L3, which  may be painful. Electronically signed by: Gilmore Molt MD 10/01/2024 07:27 PM EDT RP Workstation: [id number]   CT Lumbar Spine Wo Contrast Result Date: 10/01/2024 EXAM: CT OF THE LUMBAR SPINE WITHOUT CONTRAST 10/01/2024 04:10:05 PM TECHNIQUE: CT of the lumbar spine was performed without the administration of intravenous contrast. Multiplanar reformatted images are provided for review. Automated exposure control, iterative reconstruction, and/or weight based adjustment of the mA/kV was utilized to reduce the radiation dose to as low as reasonably achievable. COMPARISON: MRI of the lumbar spine 07/30/2024. CLINICAL HISTORY: FINDINGS: BONES AND ALIGNMENT: Normal vertebral body heights. No acute fracture or suspicious bone lesion. Levoconvex curvature centered at T12-L1. Rightward curvature centered at L4. Prominent Schmorl's node is again noted at L2-L3. Chronic endplate changes are present on the right at L5-S1. DEGENERATIVE CHANGES: L2-L3: A  broad based disc protrusion and facet hypertrophy is present. Laminectomy decompression of the posterior canal. Severe foraminal stenosis has progressed since the prior study, left greater than right. L3-L4: Facet hypertrophy contributes to moderate foraminal narrowing, left greater than right. Laminectomy decompressed with central canal. Soft disc protrusion is asymmetric to the right. L4-L5: Laminectomy and decompression of the posterior canal. Posterior facets are fused. Moderate foraminal stenosis is present bilaterally. L5-S1: A leftward disc protrusion and facet spurring contributes to moderate left foraminal stenosis. SOFT TISSUES: Atherosclerotic calcifications are present in the aorta and branch vessels. No aneurysm is present. A 2.4 cm left adnexal cyst is stable compared to pelvis CT 08/21/2020. No follow-up imaging is recommended. IMPRESSION: 1. Severe foraminal stenosis at L2-3, left greater than right, progressed since the prior study. 2. Moderate foraminal stenosis at L4-5, bilaterally. 3. Moderate left foraminal stenosis at L5-S1. Electronically signed by: Lonni Necessary MD 10/01/2024 04:49 PM EDT RP Workstation: [id number]   US  Renal Result Date: 10/01/2024 CLINICAL DATA:  back pain and dysuria EXAM: RENAL / URINARY TRACT ULTRASOUND COMPLETE COMPARISON:  08/21/2020, 07/30/2024 FINDINGS: Right Kidney: Renal measurements: 8.8 x 4.9 x 4.2 cm = volume: 95 mL.Normal echogenicity. A couple of small cysts in the right kidney on the prior spine MR were not well visualized on today's ultrasound. No hydronephrosis or nephrolithiasis. Left Kidney: Renal measurements: 9.1 x 4.4 x 3.7 cm = volume: 78 mL. Normal echogenicity. No mass. No hydronephrosis or nephrolithiasis. Bladder: Appears normal for degree of bladder distention. Other: None. IMPRESSION: No hydronephrosis or nephrolithiasis. Electronically Signed   By: Rogelia Myers M.D.   On: 10/01/2024 13:34     Procedures   Medications Ordered  in the ED  lidocaine  (LIDODERM ) 5 % 1 patch (1 patch Transdermal Patch Applied 10/01/24 1602)  fentaNYL  (SUBLIMAZE ) injection 50 mcg (50 mcg Intravenous Given 10/01/24 1600)  gabapentin (NEURONTIN) capsule 100 mg (100 mg Oral Given 10/01/24 1601)  acetaminophen  (TYLENOL ) tablet 1,000 mg (1,000 mg Oral Given 10/01/24 1601)  sodium chloride  0.9 % bolus 500 mL (0 mLs Intravenous Stopped 10/01/24 1832)    Clinical Course as of 10/01/24 2155  Fri Oct 01, 2024  1725 CT with some progression of the foraminal stenosis.  Given the bilateral lower extremity numbness she describes this started 2 weeks ago we will obtain an MRI to rule out cauda equina syndrome.  Discussed with attending. [AA]    Clinical Course User Index [AA] Hildegard Loge, PA-C                                 Medical Decision Making Amount and/or Complexity of  Data Reviewed Labs: ordered. Radiology: ordered.  Risk OTC drugs. Prescription drug management.   Medical Decision Making / ED Course   This patient presents to the ED for concern of back pain, this involves an extensive number of treatment options, and is a complaint that carries with it a high risk of complications and morbidity.  The differential diagnosis includes cauda equina syndrome, acute on chronic pain who is sciatica COVID UTI, pyelonephritis  MDM: 83 year old female presents today for concern of back pain. Multimodal pain control was provided.  CT was ordered. She had a renal ultrasound performed through [facility].  No concerning findings on this. CBC without leukocytosis.  Mild anemia but around her baseline.  CMP shows creatinine which is around her baseline no other acute findings.  CT shows some progressive changes.  Will further obtain an MRI.  MRI without acute findings.  There are some degenerative changes at L2-L3 which could lead to her pain.  These were discussed with the patient.  Discussed close follow-up with neurosurgery.  Prednisone  given in the  meantime.  She is not a diabetic.  Discharged in stable condition.  Strict return precautions discussed. Patient voices understanding and is in agreement with plan.  Additional history obtained: -Additional history obtained from chart review -External records from outside source obtained and reviewed including: Chart review including previous notes, labs, imaging, consultation notes   Lab Tests: -I ordered, reviewed, and interpreted labs.   The pertinent results include:   Labs Reviewed  COMPREHENSIVE METABOLIC PANEL WITH GFR - Abnormal; Notable for the following components:      Result Value   Glucose, Bld 106 (*)    Creatinine, Ser 1.42 (*)    Albumin 3.4 (*)    GFR, Estimated 36 (*)    All other components within normal limits  URINALYSIS, ROUTINE W REFLEX MICROSCOPIC - Abnormal; Notable for the following components:   Glucose, UA >=500 (*)    All other components within normal limits  CBC WITH DIFFERENTIAL/PLATELET - Abnormal; Notable for the following components:   WBC 3.6 (*)    RBC 3.55 (*)    Hemoglobin 11.1 (*)    HCT 34.9 (*)    Neutro Abs 1.3 (*)    All other components within normal limits  URINE CULTURE  LIPASE, BLOOD      EKG  EKG Interpretation Date/Time:    Ventricular Rate:    PR Interval:    QRS Duration:    QT Interval:    QTC Calculation:   R Axis:      Text Interpretation:           Imaging Studies ordered: I ordered imaging studies including renal ultrasound, CT L-spine, MRI lumbar spine without contrast I independently visualized and interpreted imaging. I agree with the radiologist interpretation   Medicines ordered and prescription drug management: Meds ordered this encounter  Medications   fentaNYL  (SUBLIMAZE ) injection 50 mcg   lidocaine  (LIDODERM ) 5 % 1 patch   gabapentin (NEURONTIN) capsule 100 mg   acetaminophen  (TYLENOL ) tablet 1,000 mg   sodium chloride  0.9 % bolus 500 mL   predniSONE  (DELTASONE ) 20 MG tablet    Sig:  Take 2 tablets (40 mg total) by mouth daily with breakfast for 5 days.    Dispense:  10 tablet    Refill:  0    Supervising Provider:   CLEOTILDE ROGUE [[id number]]    -I have reviewed the patients home medicines and have made adjustments as needed  Critical interventions  Not applicable  Reevaluation: After the interventions noted above, I reevaluated the patient and found that they have :improved  Co morbidities that complicate the patient evaluation  Past Medical History:  Diagnosis Date   Atrial fibrillation (HCC)    CHF (congestive heart failure) (HCC)    Coronary artery disease    Diabetes mellitus without complication (HCC)    Hypercholesteremia    Hypertension    Renal disorder    CKD Stage IV   Stroke [facility])       Dispostion: Discharged in stable condition.  Return precaution discussed.  Patient voices understanding.  Final diagnoses:  Acute on chronic back pain    ED Discharge Orders          Ordered    predniSONE  (DELTASONE ) 20 MG tablet  Daily with breakfast        10/01/24 2153               Hildegard Loge, PA-C 10/01/24 2214    Tegeler, Lonni PARAS, MD 10/01/24 2239

## 2024-10-01 NOTE — ED Triage Notes (Signed)
 Pt. Stated, I've had a lot of urine with back pain and have diarrhea for 5 days

## 2024-10-01 NOTE — Discharge Instructions (Addendum)
 MRI does not show any worrisome findings.  It does show degenerative changes at L2-L3 which could be painful and leading to your symptoms.  But nothing emergent.  Please follow-up with your neurosurgeon.  If you do not have one of the listed information for 1 above.  Please return to the emergency department for any concerning symptoms.  Have sent steroids into the pharmacy for you.  Start taking these tomorrow.

## 2024-10-01 NOTE — ED Provider Triage Note (Signed)
 Emergency Medicine Provider Triage Evaluation Note  Shay Jhaveri , a 83 y.o. female  was evaluated in triage.  Pt complains of diarrhea and increased urination. Started on Gemtesa for urinary incontinence 1 weeks ago. Worked for a few days but now no longer hurting.    Review of Systems  Positive: Dysuria, diarrhea, hip pain, back pain Negative: fevers  Physical Exam  BP (!) 163/83 (BP Location: Left Arm)   Pulse 79   Temp 97.9 F (36.6 C)   Resp 17   SpO2 97%  Gen:   Awake, no distress   Resp:  Normal effort  MSK:   In wheelchair Other:  Abd nontender   Medical Decision Making  Medically screening exam initiated at 11:40 AM.  Appropriate orders placed.  Deliah Strehlow was informed that the remainder of the evaluation will be completed by another provider, this initial triage assessment does not replace that evaluation, and the importance of remaining in the ED until their evaluation is complete.     Leigha Olberding, DO 10/01/24 1143

## 2024-10-01 NOTE — ED Triage Notes (Addendum)
 PT presents for diarrhea x 3 days with 9/10 back pain and no appetite.

## 2024-10-03 DIAGNOSIS — M549 Dorsalgia, unspecified: Secondary | ICD-10-CM | POA: Diagnosis not present

## 2024-10-04 LAB — URINE CULTURE: Culture: 10000 — AB

## 2024-10-05 ENCOUNTER — Telehealth (HOSPITAL_BASED_OUTPATIENT_CLINIC_OR_DEPARTMENT_OTHER): Payer: Self-pay | Admitting: *Deleted

## 2024-10-05 ENCOUNTER — Other Ambulatory Visit: Payer: Self-pay | Admitting: Nurse Practitioner

## 2024-10-05 DIAGNOSIS — I2510 Atherosclerotic heart disease of native coronary artery without angina pectoris: Secondary | ICD-10-CM

## 2024-10-05 NOTE — Telephone Encounter (Signed)
 Post ED Visit - Positive Culture Follow-up  Culture report reviewed by antimicrobial stewardship pharmacist: [facility] [x]  Dorn Poot, Pharm.D. []  Venetia Gully, Pharm.D., BCPS AQ-ID []  Garrel Crews, Pharm.D., BCPS []  Almarie Lunger, Pharm.D., BCPS []  [place], [address].D., BCPS, AAHIVP []  Rosaline Bihari, Pharm.D., BCPS, AAHIVP []  Vernell Meier, PharmD, BCPS []  Latanya Hint, PharmD, BCPS []  Donald Medley, PharmD, BCPS []  Rocky Bold, PharmD []  Dorothyann Alert, PharmD, BCPS []  Morene Babe, PharmD  [facility] []  Rosaline Edison, PharmD []  Romona Bliss, PharmD []  Dolphus Roller, PharmD []  Veva Seip, Rph []  Vernell Daunt) Leonce, PharmD []  Eva Allis, PharmD []  Rosaline Millet, PharmD []  Iantha Batch, PharmD []  Arvin Gauss, PharmD []  Wanda Hasting, PharmD []  Ronal Rav, PharmD []  Rocky Slade, PharmD []  Bard Jeans, PharmD   Positive urine culture No bacteria, do not treat.  No further patient follow-up is required at this time.  [name] 10/05/2024, 9:42 AM

## 2024-10-07 ENCOUNTER — Other Ambulatory Visit: Payer: Self-pay | Admitting: Cardiology

## 2024-10-11 ENCOUNTER — Encounter: Payer: Self-pay | Admitting: Radiology

## 2024-10-11 DIAGNOSIS — R195 Other fecal abnormalities: Secondary | ICD-10-CM | POA: Diagnosis not present

## 2024-10-11 DIAGNOSIS — G8929 Other chronic pain: Secondary | ICD-10-CM | POA: Diagnosis not present

## 2024-10-11 DIAGNOSIS — M545 Low back pain, unspecified: Secondary | ICD-10-CM | POA: Diagnosis not present

## 2024-10-15 DIAGNOSIS — R634 Abnormal weight loss: Secondary | ICD-10-CM | POA: Diagnosis not present

## 2024-10-15 DIAGNOSIS — K5900 Constipation, unspecified: Secondary | ICD-10-CM | POA: Diagnosis not present

## 2024-10-15 DIAGNOSIS — R195 Other fecal abnormalities: Secondary | ICD-10-CM | POA: Diagnosis not present

## 2024-10-15 DIAGNOSIS — R710 Precipitous drop in hematocrit: Secondary | ICD-10-CM | POA: Diagnosis not present

## 2024-10-21 ENCOUNTER — Telehealth (HOSPITAL_BASED_OUTPATIENT_CLINIC_OR_DEPARTMENT_OTHER): Payer: Self-pay | Admitting: *Deleted

## 2024-10-21 DIAGNOSIS — Z6830 Body mass index (BMI) 30.0-30.9, adult: Secondary | ICD-10-CM | POA: Diagnosis not present

## 2024-10-21 DIAGNOSIS — M47816 Spondylosis without myelopathy or radiculopathy, lumbar region: Secondary | ICD-10-CM | POA: Diagnosis not present

## 2024-10-21 NOTE — Telephone Encounter (Signed)
   Pre-operative Risk Assessment    Patient Name: Kristin [name]  DOB: Jun 07, 1941 MRN: [id number]   Date of last office visit: 04/09/24 DR. TOLIA Date of next office visit: NONE   Request for Surgical Clearance    Procedure:  RIGHT L2-L3 ; LEFT MESSAGE TO CALL BACK AND CLARIFY IF INJECTION OR A FUSION?   Date of Surgery:  Clearance TBD                                Surgeon:  DR. DAVE [facility] Surgeon's Group or Practice Name:  [facility] Phone number:  [phone] Fax number:  [phone]   Type of Clearance Requested:   - Medical  - Pharmacy:  Hold Apixaban  (Eliquis ) x 3 DAYS PRIOR; RESUME THE NEXT DAY   Type of Anesthesia:  None    Additional requests/questions:    Bonney Niels Jest   10/21/2024, 3:09 PM

## 2024-10-22 NOTE — Telephone Encounter (Signed)
 Pharmacy, can you please provide recommendations for holding Eliquis  for upcoming procedure? Thank you!  Of note for pre-op team, patient will need virtual visit after pharmacy recommendations.

## 2024-10-22 NOTE — Telephone Encounter (Signed)
 Amber wit hDr. Sunnie office calling to inform preop patient is having right L20L3 ESI. Please advise.

## 2024-10-22 NOTE — Telephone Encounter (Signed)
 I left a message again today as I was told Dr. Sunnie team is not in the office today. I had left a message yesterday as well for Dr. Sunnie surgery scheduler though did not hear back.   We are needing the complete procedure for Right L2-L3; is this a injectio or a fusion or something else?  I am going to fax the notes again to Dr. Sunnie office. Our team cannot proceed with the preop clearance until we have complete information for procedure.

## 2024-10-25 NOTE — Telephone Encounter (Signed)
 Patient with diagnosis of afib on Eliquis  for anticoagulation.    Procedure: L20L3 ESI  Date of procedure: TBD High   CHA2DS2-VASc Score = 9   This indicates a 12.2% annual risk of stroke. The patient's score is based upon: CHF History: 1 HTN History: 1 Diabetes History: 1 Stroke History: 2 Vascular Disease History: 1 Age Score: 2 Gender Score: 1      CrCl 30.7 ml/min Platelet count 210  Patient has not had an Afib/aflutter ablation in the last 3 months, DCCV within the last 4 weeks or a watchman implanted in the last 45 days   Patient with history of stroke and CHADSVASC2 of 9. Will need to hold 3 days. Will confirm with Dr. Michele he is comfortable with a 3 day hold.   **This guidance is not considered finalized until pre-operative APP has relayed final recommendations.**

## 2024-10-26 NOTE — Telephone Encounter (Signed)
 Left message for the pt to call, back as she is going to need a tele preop appt.

## 2024-10-26 NOTE — Telephone Encounter (Signed)
 For clarification she is having L2-L3 Epidural steroid injection? Yes, may hold anticoagulation for 3 days prior to the procedure.  Please update both patient and daughter (very nice and involved in her care) During the time she is holding oral anticoagulation they need to be aware of stroke -like symptoms and if present go to ER via EMS.   Dr. Kamel Haven

## 2024-10-26 NOTE — Telephone Encounter (Signed)
   Name: Kristin [name]  DOB: 1941-06-20  MRN: [id number]  Primary Cardiologist: Madonna Large, DO   Preoperative team, please contact this patient and set up a phone call appointment for further preoperative risk assessment. Please obtain consent and complete medication review. Thank you for your help.  I confirm that guidance regarding antiplatelet and oral anticoagulation therapy has been completed and, if necessary, noted below.  Per Dr. Large, please review stroke risk and ER precautions.  I also confirmed the patient resides in the state of [place] . As per [facility] Medical Board telemedicine laws, the patient must reside in the state in which the provider is licensed.   Jon Nat Hails, PA 10/26/2024, 8:39 AM [facility]

## 2024-10-28 ENCOUNTER — Telehealth (HOSPITAL_BASED_OUTPATIENT_CLINIC_OR_DEPARTMENT_OTHER): Payer: Self-pay | Admitting: *Deleted

## 2024-10-28 NOTE — Telephone Encounter (Signed)
 I s/w pt's daughter who asked for me to call the pt at [phone]. I then called and s/w the pt who agreed to tele preop appt 11/02/24. Pt said Dr. Darlis waiting for clearance before scheduling procedure. Med rec and consent are done.

## 2024-10-28 NOTE — Telephone Encounter (Signed)
 I s/w pt's daughter who asked for me to call the pt at [phone]. I then called and s/w the pt who agreed to tele preop appt 11/02/24. Pt said Dr. Darlis waiting for clearance before scheduling procedure. Med rec and consent are done.      Patient Consent for Virtual Visit        [name] has provided verbal consent on 10/28/2024 for a virtual visit (video or telephone).   CONSENT FOR VIRTUAL VISIT FOR:  Kristin [name]  By participating in this virtual visit I agree to the following:  I hereby voluntarily request, consent and authorize [facility] and its employed or contracted physicians, physician assistants, nurse practitioners or other licensed health care professionals (the Practitioner), to provide me with telemedicine health care services (the "Services) as deemed necessary by the treating Practitioner. I acknowledge and consent to receive the Services by the Practitioner via telemedicine. I understand that the telemedicine visit will involve communicating with the Practitioner through live audiovisual communication technology and the disclosure of certain medical information by electronic transmission. I acknowledge that I have been given the opportunity to request an in-person assessment or other available alternative prior to the telemedicine visit and am voluntarily participating in the telemedicine visit.  I understand that I have the right to withhold or withdraw my consent to the use of telemedicine in the course of my care at any time, without affecting my right to future care or treatment, and that the Practitioner or I may terminate the telemedicine visit at any time. I understand that I have the right to inspect all information obtained and/or recorded in the course of the telemedicine visit and may receive copies of available information for a reasonable fee.  I understand that some of the potential risks of receiving the Services via telemedicine include:   Delay or interruption in medical evaluation due to technological equipment failure or disruption; Information transmitted may not be sufficient (e.g. poor resolution of images) to allow for appropriate medical decision making by the Practitioner; and/or  In rare instances, security protocols could fail, causing a breach of personal health information.  Furthermore, I acknowledge that it is my responsibility to provide information about my medical history, conditions and care that is complete and accurate to the best of my ability. I acknowledge that Practitioner's advice, recommendations, and/or decision may be based on factors not within their control, such as incomplete or inaccurate data provided by me or distortions of diagnostic images or specimens that may result from electronic transmissions. I understand that the practice of medicine is not an exact science and that Practitioner makes no warranties or guarantees regarding treatment outcomes. I acknowledge that a copy of this consent can be made available to me via my patient portal [facility] MyChart), or I can request a printed copy by calling the office of [facility].    I understand that my insurance will be billed for this visit.   I have read or had this consent read to me. I understand the contents of this consent, which adequately explains the benefits and risks of the Services being provided via telemedicine.  I have been provided ample opportunity to ask questions regarding this consent and the Services and have had my questions answered to my satisfaction. I give my informed consent for the services to be provided through the use of telemedicine in my medical care

## 2024-11-02 ENCOUNTER — Ambulatory Visit: Admitting: Cardiology

## 2024-11-02 DIAGNOSIS — Z01810 Encounter for preprocedural cardiovascular examination: Secondary | ICD-10-CM | POA: Diagnosis not present

## 2024-11-02 DIAGNOSIS — Z01818 Encounter for other preprocedural examination: Secondary | ICD-10-CM

## 2024-11-02 NOTE — Progress Notes (Signed)
 Virtual Visit via Telephone Note   Because of Kristin [name] co-morbid illnesses, she is at least at moderate risk for complications without adequate follow up.  This format is felt to be most appropriate for this patient at this time.  Due to technical limitations with video connection (technology), today's appointment will be conducted as an audio only telehealth visit, and [name] verbally agreed to proceed in this manner.   All issues noted in this document were discussed and addressed.  No physical exam could be performed with this format.  Evaluation Performed:  Preoperative cardiovascular risk assessment _____________   Date:  11/02/2024   Patient ID:  Kristin [name], DOB [birth year], MRN [id number] Patient Location:  Home Provider location:   Office  Primary Care Provider:  Rolinda Millman, MD Primary Cardiologist:  Madonna Large, DO  Chief Complaint / Patient Profile   83 y.o. y/o female with a h/o paroxysmal atrial fibrillation on Eliquis , CAD with non-STEMI in 2021,HFimpEF, history of stroke, CKD stage IV, DM 2 who is pending L2-L3 epidural steroid injection and presents today for telephonic preoperative cardiovascular risk assessment.  History of Present Illness    Kristin [name] is a 83 y.o. female who presents via audio/video conferencing for a telehealth visit today.  Pt was last seen in cardiology clinic on 04/09/2024 by Dr. Large.  At that time [name] was doing well from a cardiac perspective and was advised she can follow-up in 1 year.  The patient is now pending procedure as outlined above. Since her last visit, she has been doing well. She lives with her daughter, is independent, no formal complaints from a cardiac perspective. She denies chest pain, palpitations, dyspnea, pnd, orthopnea, n, v, dizziness, syncope, edema, weight gain, or early satiety.    Past Medical History    Past Medical History:  Diagnosis Date   Atrial fibrillation (HCC)     CHF (congestive heart failure) (HCC)    Coronary artery disease    Diabetes mellitus without complication (HCC)    Hypercholesteremia    Hypertension    Renal disorder    CKD Stage IV   Stroke [facility])    Past Surgical History:  Procedure Laterality Date   CORONARY STENT INTERVENTION N/A 05/15/2021   Procedure: CORONARY STENT INTERVENTION;  Surgeon: Elmira Newman PARAS, MD;  Location: [facility] INVASIVE CV LAB;  Service: Cardiovascular;  Laterality: N/A;   LEFT HEART CATH AND CORONARY ANGIOGRAPHY N/A 05/11/2021   Procedure: LEFT HEART CATH AND CORONARY ANGIOGRAPHY;  Surgeon: Elmira Newman PARAS, MD;  Location: [facility] INVASIVE CV LAB;  Service: Cardiovascular;  Laterality: N/A;   LUMBAR LAMINECTOMY/DECOMPRESSION MICRODISCECTOMY N/A 08/19/2020   Procedure: LUMBAR LAMINECTOMY/DECOMPRESSION  Lumbar two-three, Lumbar three-four;  Surgeon: Dawley, Lani BROCKS, DO;  Location: [facility] OR;  Service: Neurosurgery;  Laterality: N/A;   RIGHT/LEFT HEART CATH AND CORONARY ANGIOGRAPHY N/A 08/23/2020   Procedure: RIGHT/LEFT HEART CATH AND CORONARY ANGIOGRAPHY;  Surgeon: Ladona Heinz, MD;  Location: [facility] INVASIVE CV LAB;  Service: Cardiovascular;  Laterality: N/A;    Allergies  Allergies  Allergen Reactions   Porcine (Pork) Protein-Containing Drug Products     No pork shrimp or lobster for religious beliefs   Tramadol Nausea And Vomiting    Home Medications    Prior to Admission medications   Medication Sig Start Date End Date Taking? Authorizing Provider  acetaminophen  (TYLENOL ) 500 MG tablet Take 1,000 mg by mouth in the morning, at noon, and at bedtime.    [provider]  apixaban  (ELIQUIS )  2.5 MG TABS tablet Take 1 tablet by mouth twice daily 05/24/24   Tolia, Sunit, DO  carvedilol  (COREG ) 12.5 MG tablet TAKE 1 TABLET BY MOUTH TWICE DAILY WITH A MEAL 07/28/24   Tolia, Sunit, DO  cyclobenzaprine  (FLEXERIL ) 10 MG tablet Take 0.5 tablets (5 mg total) by mouth 2 (two) times daily as needed for muscle spasms. 09/02/23    Odell Balls, PA-C  diclofenac  Sodium (VOLTAREN ) 1 % GEL Apply 2 g topically 4 (four) times daily. 08/30/20   Cheryle Page, MD  ezetimibe  (ZETIA ) 10 MG tablet Take 1 tablet by mouth once daily 10/07/24   Tolia, Sunit, DO  FARXIGA  10 MG TABS tablet TAKE 1 TABLET BY MOUTH ONCE DAILY BEFORE BREAKFAST 08/20/24   Tolia, Sunit, DO  hydrALAZINE  (APRESOLINE ) 100 MG tablet TAKE 1 TABLET BY MOUTH THREE TIMES DAILY 10/05/24   Tolia, Sunit, DO  isosorbide  mononitrate (IMDUR ) 120 MG 24 hr tablet Take 1 tablet by mouth once daily Patient taking differently: Take 120 mg by mouth in the morning. 01/06/24   Dick, Ernest H Jr., NP  Multiple Vitamin (MULTIVITAMIN WITH MINERALS) TABS tablet Take 1 tablet by mouth in the morning.    [provider]  nitroGLYCERIN  (NITROSTAT ) 0.4 MG SL tablet Place 1 tablet (0.4 mg total) under the tongue every 5 (five) minutes as needed for chest pain. 04/09/24   Tolia, Sunit, DO  pantoprazole  (PROTONIX ) 40 MG tablet Take 1 tablet (40 mg total) by mouth daily. Patient taking differently: Take 40 mg by mouth in the morning. 08/30/20   Cheryle Page, MD  rosuvastatin  (CRESTOR ) 40 MG tablet Take 1 tablet (40 mg total) by mouth at bedtime. Patient taking differently: Take 40 mg by mouth in the morning. 10/10/23   Wyn Jackee VEAR Mickey., NP  sacubitril -valsartan  (ENTRESTO ) 49-51 MG Take 1 tablet by mouth twice daily 01/27/24   Wyn Jackee VEAR Mickey., NP    Physical Exam    Vital Signs:  Jayelle Page does not have vital signs available for review today.  Given telephonic nature of communication, physical exam is limited. AAOx3. NAD. Normal affect.  Speech and respirations are unlabored.  Accessory Clinical Findings    None  Assessment & Plan    1.  Preoperative Cardiovascular Risk Assessment:     [name] perioperative risk of a major cardiac event is 6.6% according to the Revised Cardiac Risk Index (RCRI).  Therefore, she is at high risk for perioperative complications.    Her functional capacity is fair at 4.4 METs according to the Duke Activity Status Index (DASI). Recommendations: According to ACC/AHA guidelines, no further cardiovascular testing needed.  The patient may proceed to surgery at acceptable risk.   Antiplatelet and/or Anticoagulation Recommendations:  Eliquis  (Apixaban ) can be held for 3 days prior to surgery.  Please resume post op when felt to be safe.      The patient was advised that if she develops new symptoms prior to surgery to contact our office to arrange for a follow-up visit, and she verbalized understanding.    A copy of this note will be routed to requesting surgeon.  Time:   Today, I have spent 10 minutes with the patient with telehealth technology discussing medical history, symptoms, and management plan.     Delon JAYSON Hoover, NP  11/02/2024, 9:08 AM

## 2024-11-09 ENCOUNTER — Telehealth: Payer: Self-pay | Admitting: Cardiology

## 2024-11-09 NOTE — Telephone Encounter (Signed)
 I have reviewed notes from 11/02/24 Delon Hoover, [facility] who cleared pt and noted recommendations for blood thinner. I will have preop APP review as well to be sure nothing was missed.

## 2024-11-09 NOTE — Telephone Encounter (Signed)
 See previous clearance encounter--Kristin [name] with [facility] is following up requesting updates on clearance. Please advise.  fax#: [phone] phone#: [phone]

## 2024-11-09 NOTE — Telephone Encounter (Signed)
 I will re-fax notes from Delon Hoover, [facility] who cleared pt already.

## 2024-11-11 ENCOUNTER — Other Ambulatory Visit: Payer: Self-pay | Admitting: Cardiology

## 2024-11-11 DIAGNOSIS — Z7901 Long term (current) use of anticoagulants: Secondary | ICD-10-CM

## 2024-11-11 DIAGNOSIS — I480 Paroxysmal atrial fibrillation: Secondary | ICD-10-CM

## 2024-11-11 NOTE — Telephone Encounter (Signed)
 Prescription refill request for Eliquis  received. Indication:afib Last office visit:11/25 Scr: 1.42  10/25 Age:83 Weight:83  kg  Prescription refilled

## 2024-11-23 DIAGNOSIS — M4726 Other spondylosis with radiculopathy, lumbar region: Secondary | ICD-10-CM | POA: Diagnosis not present

## 2024-11-25 DIAGNOSIS — H1045 Other chronic allergic conjunctivitis: Secondary | ICD-10-CM | POA: Diagnosis not present

## 2024-11-25 DIAGNOSIS — E119 Type 2 diabetes mellitus without complications: Secondary | ICD-10-CM | POA: Diagnosis not present

## 2024-11-25 DIAGNOSIS — H524 Presbyopia: Secondary | ICD-10-CM | POA: Diagnosis not present

## 2024-11-25 DIAGNOSIS — H04123 Dry eye syndrome of bilateral lacrimal glands: Secondary | ICD-10-CM | POA: Diagnosis not present

## 2024-11-25 DIAGNOSIS — H40013 Open angle with borderline findings, low risk, bilateral: Secondary | ICD-10-CM | POA: Diagnosis not present

## 2024-11-26 DIAGNOSIS — D72819 Decreased white blood cell count, unspecified: Secondary | ICD-10-CM | POA: Diagnosis not present

## 2024-11-26 DIAGNOSIS — E559 Vitamin D deficiency, unspecified: Secondary | ICD-10-CM | POA: Diagnosis not present

## 2024-11-30 DIAGNOSIS — M8589 Other specified disorders of bone density and structure, multiple sites: Secondary | ICD-10-CM | POA: Diagnosis not present

## 2024-11-30 DIAGNOSIS — Z13820 Encounter for screening for osteoporosis: Secondary | ICD-10-CM | POA: Diagnosis not present

## 2024-12-17 ENCOUNTER — Other Ambulatory Visit (HOSPITAL_COMMUNITY): Payer: Self-pay | Admitting: Family Medicine

## 2024-12-17 DIAGNOSIS — R200 Anesthesia of skin: Secondary | ICD-10-CM

## 2024-12-21 ENCOUNTER — Ambulatory Visit (HOSPITAL_COMMUNITY)
Admission: RE | Admit: 2024-12-21 | Discharge: 2024-12-21 | Disposition: A | Discharge status: Home / Self Care | Source: Ambulatory Visit | Attending: Family Medicine | Admitting: Family Medicine

## 2024-12-21 DIAGNOSIS — R200 Anesthesia of skin: Secondary | ICD-10-CM | POA: Insufficient documentation

## 2024-12-21 DIAGNOSIS — Z8673 Personal history of transient ischemic attack (TIA), and cerebral infarction without residual deficits: Secondary | ICD-10-CM

## 2024-12-26 ENCOUNTER — Other Ambulatory Visit: Payer: Self-pay | Admitting: Nurse Practitioner

## 2024-12-26 ENCOUNTER — Other Ambulatory Visit: Payer: Self-pay | Admitting: Cardiology

## 2024-12-26 DIAGNOSIS — I5042 Chronic combined systolic (congestive) and diastolic (congestive) heart failure: Secondary | ICD-10-CM

## 2024-12-28 ENCOUNTER — Other Ambulatory Visit: Payer: Self-pay

## 2024-12-28 DIAGNOSIS — R195 Other fecal abnormalities: Secondary | ICD-10-CM

## 2024-12-28 DIAGNOSIS — R634 Abnormal weight loss: Secondary | ICD-10-CM

## 2024-12-30 NOTE — Telephone Encounter (Signed)
 Labs on 10/01/24 Creatine outside of Normal  In accordance with refill protocols, please review and address the following requirements before this medication refill can be authorized:  Labs

## 2024-12-30 NOTE — Telephone Encounter (Signed)
 Labs on 10/01/24 Creatinine outside of Normal  In accordance with refill protocols, please review and address the following requirements before this medication refill can be authorized:  Labs

## 2025-01-06 ENCOUNTER — Ambulatory Visit
Admission: RE | Admit: 2025-01-06 | Discharge: 2025-01-06 | Disposition: A | Discharge status: Home / Self Care | Source: Ambulatory Visit

## 2025-01-06 DIAGNOSIS — R634 Abnormal weight loss: Secondary | ICD-10-CM

## 2025-01-06 DIAGNOSIS — R195 Other fecal abnormalities: Secondary | ICD-10-CM

## 2025-01-07 ENCOUNTER — Other Ambulatory Visit: Payer: Self-pay | Admitting: Nurse Practitioner

## 2025-01-07 DIAGNOSIS — I2510 Atherosclerotic heart disease of native coronary artery without angina pectoris: Secondary | ICD-10-CM

## 2025-01-11 ENCOUNTER — Other Ambulatory Visit: Payer: Self-pay | Admitting: Nurse Practitioner

## 2025-01-11 DIAGNOSIS — I2510 Atherosclerotic heart disease of native coronary artery without angina pectoris: Secondary | ICD-10-CM
